# Patient Record
Sex: Female | Born: 1985 | Race: White | Hispanic: No | Smoking: Current every day smoker
Health system: Southern US, Community
[De-identification: ages and names within clinical notes are randomized; demographics above are authoritative.]

## PROBLEM LIST (undated history)

## (undated) DIAGNOSIS — N056 Unspecified nephritic syndrome with dense deposit disease: Secondary | ICD-10-CM

## (undated) DIAGNOSIS — F32A Depression, unspecified: Secondary | ICD-10-CM

## (undated) DIAGNOSIS — I10 Essential (primary) hypertension: Secondary | ICD-10-CM

## (undated) DIAGNOSIS — J189 Pneumonia, unspecified organism: Secondary | ICD-10-CM

## (undated) DIAGNOSIS — D649 Anemia, unspecified: Secondary | ICD-10-CM

## (undated) DIAGNOSIS — C801 Malignant (primary) neoplasm, unspecified: Secondary | ICD-10-CM

## (undated) DIAGNOSIS — E063 Autoimmune thyroiditis: Secondary | ICD-10-CM

## (undated) DIAGNOSIS — E079 Disorder of thyroid, unspecified: Secondary | ICD-10-CM

## (undated) DIAGNOSIS — E039 Hypothyroidism, unspecified: Secondary | ICD-10-CM

## (undated) DIAGNOSIS — F419 Anxiety disorder, unspecified: Secondary | ICD-10-CM

## (undated) DIAGNOSIS — M1990 Unspecified osteoarthritis, unspecified site: Secondary | ICD-10-CM

## (undated) DIAGNOSIS — T8859XA Other complications of anesthesia, initial encounter: Secondary | ICD-10-CM

## (undated) DIAGNOSIS — G40909 Epilepsy, unspecified, not intractable, without status epilepticus: Secondary | ICD-10-CM

## (undated) DIAGNOSIS — N186 End stage renal disease: Secondary | ICD-10-CM

## (undated) DIAGNOSIS — R0600 Dyspnea, unspecified: Secondary | ICD-10-CM

## (undated) DIAGNOSIS — E785 Hyperlipidemia, unspecified: Secondary | ICD-10-CM

## (undated) DIAGNOSIS — Z87442 Personal history of urinary calculi: Secondary | ICD-10-CM

## (undated) DIAGNOSIS — N289 Disorder of kidney and ureter, unspecified: Secondary | ICD-10-CM

## (undated) DIAGNOSIS — Z992 Dependence on renal dialysis: Secondary | ICD-10-CM

## (undated) DIAGNOSIS — Z5189 Encounter for other specified aftercare: Secondary | ICD-10-CM

## (undated) HISTORY — PX: RENAL BIOPSY: SHX156

## (undated) HISTORY — PX: DIAGNOSTIC LAPAROSCOPY: SUR761

## (undated) HISTORY — PX: ABDOMINAL SURGERY: SHX537

## (undated) HISTORY — PX: ABDOMINAL HYSTERECTOMY: SHX81

## (undated) HISTORY — PX: TUMOR REMOVAL: SHX12

---

## 2016-08-25 HISTORY — PX: CERVICAL BIOPSY  W/ LOOP ELECTRODE EXCISION: SUR135

## 2018-08-16 ENCOUNTER — Ambulatory Visit
Admission: RE | Admit: 2018-08-16 | Discharge: 2018-08-16 | Disposition: A | Discharge status: Home / Self Care | Payer: Managed Care, Other (non HMO) | Source: Ambulatory Visit | Attending: Nephrology | Admitting: Nephrology

## 2018-08-16 ENCOUNTER — Ambulatory Visit
Admission: RE | Admit: 2018-08-16 | Discharge: 2018-08-16 | Disposition: A | Discharge status: Home / Self Care | Payer: Managed Care, Other (non HMO) | Attending: Nephrology | Admitting: Nephrology

## 2018-08-16 ENCOUNTER — Other Ambulatory Visit: Payer: Self-pay | Admitting: Nephrology

## 2018-08-16 DIAGNOSIS — R109 Unspecified abdominal pain: Secondary | ICD-10-CM | POA: Diagnosis present

## 2018-08-16 DIAGNOSIS — N200 Calculus of kidney: Secondary | ICD-10-CM | POA: Insufficient documentation

## 2018-11-05 DIAGNOSIS — N186 End stage renal disease: Secondary | ICD-10-CM

## 2018-11-05 DIAGNOSIS — N059 Unspecified nephritic syndrome with unspecified morphologic changes: Secondary | ICD-10-CM | POA: Insufficient documentation

## 2019-04-17 ENCOUNTER — Encounter (HOSPITAL_COMMUNITY): Payer: Self-pay | Admitting: Emergency Medicine

## 2019-04-17 ENCOUNTER — Emergency Department (HOSPITAL_COMMUNITY): Payer: Medicare Other

## 2019-04-17 ENCOUNTER — Other Ambulatory Visit: Payer: Self-pay

## 2019-04-17 ENCOUNTER — Emergency Department (HOSPITAL_COMMUNITY)
Admission: EM | Admit: 2019-04-17 | Discharge: 2019-04-18 | Disposition: A | Discharge status: Home / Self Care | Payer: Medicare Other | Attending: Emergency Medicine | Admitting: Emergency Medicine

## 2019-04-17 DIAGNOSIS — N186 End stage renal disease: Secondary | ICD-10-CM | POA: Diagnosis not present

## 2019-04-17 DIAGNOSIS — Z992 Dependence on renal dialysis: Secondary | ICD-10-CM | POA: Diagnosis not present

## 2019-04-17 DIAGNOSIS — I120 Hypertensive chronic kidney disease with stage 5 chronic kidney disease or end stage renal disease: Secondary | ICD-10-CM | POA: Diagnosis not present

## 2019-04-17 DIAGNOSIS — E860 Dehydration: Secondary | ICD-10-CM | POA: Diagnosis not present

## 2019-04-17 DIAGNOSIS — R339 Retention of urine, unspecified: Secondary | ICD-10-CM | POA: Diagnosis not present

## 2019-04-17 DIAGNOSIS — Z79899 Other long term (current) drug therapy: Secondary | ICD-10-CM | POA: Diagnosis not present

## 2019-04-17 DIAGNOSIS — R42 Dizziness and giddiness: Secondary | ICD-10-CM | POA: Diagnosis present

## 2019-04-17 LAB — URINALYSIS, ROUTINE W REFLEX MICROSCOPIC
Bilirubin Urine: NEGATIVE
Glucose, UA: 150 mg/dL — AB
Ketones, ur: NEGATIVE mg/dL
Nitrite: NEGATIVE
Protein, ur: 100 mg/dL — AB
Specific Gravity, Urine: 1.015 (ref 1.005–1.030)
pH: 7 (ref 5.0–8.0)

## 2019-04-17 LAB — LACTIC ACID, PLASMA: Lactic Acid, Venous: 1.2 mmol/L (ref 0.5–1.9)

## 2019-04-17 LAB — CBC WITH DIFFERENTIAL/PLATELET
Abs Immature Granulocytes: 0.14 10*3/uL — ABNORMAL HIGH (ref 0.00–0.07)
Basophils Absolute: 0.1 10*3/uL (ref 0.0–0.1)
Basophils Relative: 0 %
Eosinophils Absolute: 0.3 10*3/uL (ref 0.0–0.5)
Eosinophils Relative: 2 %
HCT: 33.1 % — ABNORMAL LOW (ref 36.0–46.0)
Hemoglobin: 11.2 g/dL — ABNORMAL LOW (ref 12.0–15.0)
Immature Granulocytes: 1 %
Lymphocytes Relative: 12 %
Lymphs Abs: 1.7 10*3/uL (ref 0.7–4.0)
MCH: 33.1 pg (ref 26.0–34.0)
MCHC: 33.8 g/dL (ref 30.0–36.0)
MCV: 97.9 fL (ref 80.0–100.0)
Monocytes Absolute: 0.9 10*3/uL (ref 0.1–1.0)
Monocytes Relative: 6 %
Neutro Abs: 11.9 10*3/uL — ABNORMAL HIGH (ref 1.7–7.7)
Neutrophils Relative %: 79 %
Platelets: 270 10*3/uL (ref 150–400)
RBC: 3.38 MIL/uL — ABNORMAL LOW (ref 3.87–5.11)
RDW: 14.8 % (ref 11.5–15.5)
WBC: 15.1 10*3/uL — ABNORMAL HIGH (ref 4.0–10.5)
nRBC: 0 % (ref 0.0–0.2)

## 2019-04-17 LAB — COMPREHENSIVE METABOLIC PANEL
ALT: 16 U/L (ref 0–44)
AST: 12 U/L — ABNORMAL LOW (ref 15–41)
Albumin: 3.6 g/dL (ref 3.5–5.0)
Alkaline Phosphatase: 90 U/L (ref 38–126)
Anion gap: 17 — ABNORMAL HIGH (ref 5–15)
BUN: 48 mg/dL — ABNORMAL HIGH (ref 6–20)
CO2: 23 mmol/L (ref 22–32)
Calcium: 10.1 mg/dL (ref 8.9–10.3)
Chloride: 95 mmol/L — ABNORMAL LOW (ref 98–111)
Creatinine, Ser: 12.2 mg/dL — ABNORMAL HIGH (ref 0.44–1.00)
GFR calc Af Amer: 4 mL/min — ABNORMAL LOW (ref >60)
GFR calc non Af Amer: 4 mL/min — ABNORMAL LOW (ref >60)
Glucose, Bld: 160 mg/dL — ABNORMAL HIGH (ref 70–99)
Potassium: 4 mmol/L (ref 3.5–5.1)
Sodium: 135 mmol/L (ref 135–145)
Total Bilirubin: 0.5 mg/dL (ref 0.3–1.2)
Total Protein: 7 g/dL (ref 6.5–8.1)

## 2019-04-17 LAB — PREGNANCY, URINE: Preg Test, Ur: NEGATIVE

## 2019-04-17 LAB — D-DIMER, QUANTITATIVE: D-Dimer, Quant: 0.43 ug/mL-FEU (ref 0.00–0.50)

## 2019-04-17 MED ORDER — LACTATED RINGERS IV BOLUS
250.0000 mL | Freq: Once | INTRAVENOUS | Status: AC
  Administered 2019-04-17: 250 mL via INTRAVENOUS

## 2019-04-17 MED ORDER — ALBUTEROL SULFATE HFA 108 (90 BASE) MCG/ACT IN AERS
INHALATION_SPRAY | RESPIRATORY_TRACT | Status: AC
  Administered 2019-04-17: 2 via RESPIRATORY_TRACT
  Filled 2019-04-17: qty 6.7

## 2019-04-17 MED ORDER — LACTATED RINGERS IV BOLUS
500.0000 mL | Freq: Once | INTRAVENOUS | Status: AC
  Administered 2019-04-17: 500 mL via INTRAVENOUS

## 2019-04-17 MED ORDER — ALBUTEROL SULFATE (2.5 MG/3ML) 0.083% IN NEBU: 5.0000 mg | INHALATION_SOLUTION | Freq: Once | RESPIRATORY_TRACT | Status: DC

## 2019-04-17 MED ORDER — ALBUTEROL SULFATE HFA 108 (90 BASE) MCG/ACT IN AERS
2.0000 | INHALATION_SPRAY | Freq: Four times a day (QID) | RESPIRATORY_TRACT | Status: DC
  Administered 2019-04-17: 17:00:00 2 via RESPIRATORY_TRACT

## 2019-04-17 NOTE — ED Notes (Signed)
Pt heart rate is between 110-115 while ambulating.

## 2019-04-17 NOTE — ED Triage Notes (Signed)
Pt here with c/o decreased urine out put , pt is at home [facility] dialysis 8 hrs/night , took her lasix without result , pt HR 125 with some Sob

## 2019-04-17 NOTE — ED Provider Notes (Signed)
[facility] EMERGENCY DEPARTMENT Provider Note   CSN: [id number] Arrival date & time: 04/17/19  1508     History   Chief Complaint Chief Complaint  Patient presents with  . Tachycardia  . Shortness of Breath    HPI Holly [name] is a 33 y.o. female with history of ESRD on peritoneal dialysis presenting to the ED complaining of dizziness, tachycardia, shortness of breath, and decreased urine output over the past 3 days.  Patient reports that her symptoms began 3 days ago with dizziness.  She reports her dizziness is described as lightheadedness that is worse with standing but is also present at rest.  She endorses palpitations over the past couple of days with some mild shortness of breath.  She reports that she normally has approximately 500 to 800 cc of urine output per day but over the past few days has only voided 200 cc/day.  She reports having the sensation that she needs to urinate but has difficulty initiating urination when she goes to the bathroom.  She reports this is sometimes painful but denies any burning with urination or urinary frequency.  She denies having any fever or chills throughout this time.  She is also endorsing weight gain of 4 pounds from her normal dry weight.  She reports that her dialysate has been clear and denies having any abdominal pain.  She denies any cough, vomiting, diarrhea, or any other complaints.     The history is provided by the patient.    History reviewed. No pertinent past medical history.  There are no active problems to display for this patient.   History reviewed. No pertinent surgical history.   OB History   No obstetric history on file.      Home Medications    Prior to Admission medications   Medication Sig Start Date End Date Taking? Authorizing Provider  calcitRIOL (ROCALTROL) 0.5 MCG capsule Take 0.5 mcg by mouth at bedtime.   Yes [provider]  carvedilol (COREG) 12.5 MG tablet Take 12.5 mg by  mouth 2 (two) times daily with a meal.   Yes [provider]  cinacalcet (SENSIPAR) 60 MG tablet Take 60 mg by mouth daily.   Yes [provider]  ferric citrate (AURYXIA) 1 GM 210 MG(Fe) tablet Take 630 mg by mouth 3 (three) times daily with meals.   Yes [provider]  furosemide (LASIX) 80 MG tablet Take 80 mg by mouth 2 (two) times daily as needed for fluid.   Yes [provider]  levothyroxine (SYNTHROID) 200 MCG tablet Take 200 mcg by mouth daily before breakfast.   Yes [provider]  loratadine (CLARITIN) 10 MG tablet Take 10 mg by mouth daily.   Yes [provider]  losartan (COZAAR) 100 MG tablet Take 100 mg by mouth daily.   Yes [provider]  multivitamin (RENA-VIT) TABS tablet Take 1 tablet by mouth daily.   Yes [provider]  oxcarbazepine (TRILEPTAL) 600 MG tablet Take 600 mg by mouth 2 (two) times daily.   Yes [provider]  simvastatin (ZOCOR) 20 MG tablet Take 20 mg by mouth at bedtime.   Yes [provider]    Family History No family history on file.  Social History Social History   Tobacco Use  . Smoking status: Never Smoker  Substance Use Topics  . Alcohol use: Not on file  . Drug use: Not on file     Allergies   Bupropion, Ciprofloxacin,  Levetiracetam, Oxycodone-acetaminophen, Rituximab, and Tramadol   Review of Systems Review of Systems  Constitutional: Negative for chills and fever.  HENT: Negative for ear pain and sore throat.   Eyes: Negative for pain and visual disturbance.  Respiratory: Positive for shortness of breath. Negative for cough.   Cardiovascular: Positive for palpitations. Negative for chest pain.  Gastrointestinal: Negative for abdominal pain and vomiting.  Genitourinary: Positive for difficulty urinating. Negative for dysuria, frequency and hematuria.  Musculoskeletal: Negative for arthralgias and back pain.  Skin: Negative for color  change and rash.  Neurological: Positive for dizziness. Negative for seizures and syncope.  All other systems reviewed and are negative.    Physical Exam Updated Vital Signs BP 114/88   Pulse 92   Temp 98.5 F (36.9 C) (Oral)   Resp 14   SpO2 100%   Physical Exam Vitals signs and nursing note reviewed.  Constitutional:      General: She is not in acute distress.    Appearance: Normal appearance. She is not ill-appearing, toxic-appearing or diaphoretic.  HENT:     Head: Normocephalic and atraumatic.     Nose: Nose normal. No congestion or rhinorrhea.     Mouth/Throat:     Mouth: Mucous membranes are dry.     Pharynx: Oropharynx is clear. No oropharyngeal exudate or posterior oropharyngeal erythema.  Eyes:     Extraocular Movements: Extraocular movements intact.     Pupils: Pupils are equal, round, and reactive to light.  Neck:     Musculoskeletal: Normal range of motion and neck supple. No neck rigidity or muscular tenderness.  Cardiovascular:     Rate and Rhythm: Regular rhythm. Tachycardia present.     Pulses: Normal pulses.     Heart sounds: Normal heart sounds. No murmur. No friction rub. No gallop.   Pulmonary:     Effort: Pulmonary effort is normal. No respiratory distress.     Breath sounds: Normal breath sounds. No stridor. No wheezing, rhonchi or rales.  Chest:     Chest wall: No tenderness.  Abdominal:     General: Abdomen is flat. There is no distension.     Palpations: Abdomen is soft.     Tenderness: There is no abdominal tenderness. There is no guarding or rebound.  Musculoskeletal: Normal range of motion.        General: No swelling, tenderness, deformity or signs of injury.     Right lower leg: No edema.     Left lower leg: No edema.  Skin:    General: Skin is warm and dry.  Neurological:     General: No focal deficit present.     Mental Status: She is alert and oriented to person, place, and time. Mental status is at baseline.     Cranial Nerves:  No cranial nerve deficit.     Sensory: No sensory deficit.     Motor: No weakness.  Psychiatric:        Mood and Affect: Mood normal.        Behavior: Behavior normal.      ED Treatments / Results  Labs (all labs ordered are listed, but only abnormal results are displayed) Labs Reviewed  CBC WITH DIFFERENTIAL/PLATELET - Abnormal; Notable for the following components:      Result Value   WBC 15.1 (*)    RBC 3.38 (*)    Hemoglobin 11.2 (*)    HCT 33.1 (*)    Neutro Abs 11.9 (*)    Abs Immature Granulocytes  0.14 (*)    All other components within normal limits  COMPREHENSIVE METABOLIC PANEL - Abnormal; Notable for the following components:   Chloride 95 (*)    Glucose, Bld 160 (*)    BUN 48 (*)    Creatinine, Ser 12.20 (*)    AST 12 (*)    GFR calc non Af Amer 4 (*)    GFR calc Af Amer 4 (*)    Anion gap 17 (*)    All other components within normal limits  URINALYSIS, ROUTINE W REFLEX MICROSCOPIC - Abnormal; Notable for the following components:   APPearance HAZY (*)    Glucose, UA 150 (*)    Hgb urine dipstick SMALL (*)    Protein, ur 100 (*)    Leukocytes,Ua TRACE (*)    Bacteria, UA RARE (*)    All other components within normal limits  LACTIC ACID, PLASMA  D-DIMER, QUANTITATIVE (NOT AT [facility])  PREGNANCY, URINE    EKG EKG Interpretation  Date/Time:  Sunday April 17 2019 15:12:18 EDT Ventricular Rate:  125 PR Interval:  132 QRS Duration: 80 QT Interval:  340 QTC Calculation: 490 R Axis:   87 Text Interpretation:  Sinus tachycardia Possible Left atrial enlargement Borderline ECG No previous ECGs available Confirmed by Theotis Burrow [phone]) on 04/17/2019 5:06:06 PM   Radiology Dg Chest Portable 1 View  Result Date: 04/17/2019 CLINICAL DATA:  Shortness of breath EXAM: PORTABLE CHEST 1 VIEW COMPARISON:  None. FINDINGS: Heart and mediastinal contours are within normal limits. No focal opacities or effusions. No acute bony abnormality. IMPRESSION: No active  disease. Electronically Signed   By: Rolm Baptise M.D.   On: 04/17/2019 16:08    Procedures Procedures (including critical care time)  Medications Ordered in ED Medications  albuterol (VENTOLIN HFA) 108 (90 Base) MCG/ACT inhaler 2 puff (2 puffs Inhalation Given 04/17/19 1644)  lactated ringers bolus 500 mL (500 mLs Intravenous New Bag/Given 04/17/19 2320)  lactated ringers bolus 250 mL (250 mLs Intravenous New Bag/Given 04/17/19 2355)     Initial Impression / Assessment and Plan / ED Course  I have reviewed the triage vital signs and the nursing notes.  Pertinent labs & imaging results that were available during my care of the patient were reviewed by me and considered in my medical decision making (see chart for details).        Rishika Mccollom is a 33 y.o. female with history of ESRD on peritoneal dialysis presenting to the ED complaining of dizziness, tachycardia, shortness of breath, and decreased urine output over the past 3 days.  On exam, patient appears volume depleted and is tachycardic to the 120s.  CMP significant for elevated creatinine to 12.2 and BUN to 48, consistent with patient's baseline ESRD.  There are no acute electrolyte derangements.  CBC shows leukocytosis to 15.1.  Patient has not had any infectious symptoms and this is felt to not be indicative of acute infectious process.  Urinalysis and chest x-ray both showed no sign of infection.  Patient's abdominal exam is completely benign and her dialysate has been clear.  Post-void residual was in the 270s.  Will have foley catheter placed for urinary retention.  Orthostatic vital signs obtained by nursing showed increase in heart rate from 98 to 110 upon standing with systolic blood pressure decrease from 125 to 111.  On reassessment, patient's heart rate was 108 while resting in bed and immediately increased to 127 when she sat up. D-dimer ordered to risk-stratify for PE.  Discussed case with nephrology, Dr. Jonnie Finner, who  recommended giving patient a 500 cc fluid bolus if she appears clinically volume depleted.  Will give patient fluid bolus and reassess.  Patient's heart rate improved following fluid bolus.  Patient was given an additional 250 cc.  She reports she is feeling better.  Patient was discharged home with instructions to follow-up with urology in the coming week to have her Foley removed.  She was advised to follow-up closely with her nephrologist.  Patient was discharged home in stable condition.  Final Clinical Impressions(s) / ED Diagnoses   Final diagnoses:  Dehydration  Urinary retention    ED Discharge Orders    None       Candie Chroman, MD 04/18/19 1638    Rex Kras Wenda Overland, MD 04/19/19 [phone]

## 2019-04-18 NOTE — ED Notes (Signed)
Patient verbalizes understanding of discharge instructions. Opportunity for questioning and answers were provided. Armband removed by staff, pt discharged from ED ambulatory with sister to take home

## 2019-05-18 ENCOUNTER — Other Ambulatory Visit: Payer: Self-pay | Admitting: Nephrology

## 2019-05-18 DIAGNOSIS — N186 End stage renal disease: Secondary | ICD-10-CM

## 2019-05-18 DIAGNOSIS — R339 Retention of urine, unspecified: Secondary | ICD-10-CM

## 2019-05-18 DIAGNOSIS — Z992 Dependence on renal dialysis: Secondary | ICD-10-CM

## 2019-05-18 NOTE — Progress Notes (Signed)
Patient c/o decreased UOP Urology appt 1 month away Ordered renal U/S to r/o retention

## 2019-06-08 ENCOUNTER — Other Ambulatory Visit: Payer: Self-pay | Admitting: Nephrology

## 2019-06-08 DIAGNOSIS — N186 End stage renal disease: Secondary | ICD-10-CM

## 2019-06-08 DIAGNOSIS — Z992 Dependence on renal dialysis: Secondary | ICD-10-CM

## 2019-06-08 DIAGNOSIS — R339 Retention of urine, unspecified: Secondary | ICD-10-CM

## 2019-06-14 ENCOUNTER — Ambulatory Visit (HOSPITAL_COMMUNITY)
Admission: RE | Admit: 2019-06-14 | Discharge: 2019-06-14 | Disposition: A | Discharge status: Home / Self Care | Payer: Medicare Other | Source: Ambulatory Visit | Attending: Nephrology | Admitting: Nephrology

## 2019-06-14 ENCOUNTER — Other Ambulatory Visit: Payer: Self-pay

## 2019-06-14 DIAGNOSIS — N186 End stage renal disease: Secondary | ICD-10-CM | POA: Diagnosis present

## 2019-06-14 DIAGNOSIS — Z992 Dependence on renal dialysis: Secondary | ICD-10-CM | POA: Insufficient documentation

## 2019-06-14 DIAGNOSIS — R339 Retention of urine, unspecified: Secondary | ICD-10-CM

## 2019-06-15 ENCOUNTER — Encounter: Payer: Self-pay | Admitting: Nephrology

## 2019-08-21 ENCOUNTER — Ambulatory Visit (HOSPITAL_COMMUNITY)
Admission: EM | Admit: 2019-08-21 | Discharge: 2019-08-21 | Disposition: A | Discharge status: Home / Self Care | Payer: Medicare Other | Attending: Emergency Medicine | Admitting: Emergency Medicine

## 2019-08-21 ENCOUNTER — Other Ambulatory Visit: Payer: Self-pay

## 2019-08-21 ENCOUNTER — Encounter (HOSPITAL_COMMUNITY): Payer: Self-pay

## 2019-08-21 DIAGNOSIS — H9201 Otalgia, right ear: Secondary | ICD-10-CM | POA: Diagnosis not present

## 2019-08-21 DIAGNOSIS — R05 Cough: Secondary | ICD-10-CM | POA: Insufficient documentation

## 2019-08-21 DIAGNOSIS — Z79899 Other long term (current) drug therapy: Secondary | ICD-10-CM | POA: Diagnosis not present

## 2019-08-21 DIAGNOSIS — R0602 Shortness of breath: Secondary | ICD-10-CM | POA: Insufficient documentation

## 2019-08-21 DIAGNOSIS — N186 End stage renal disease: Secondary | ICD-10-CM | POA: Diagnosis not present

## 2019-08-21 DIAGNOSIS — J40 Bronchitis, not specified as acute or chronic: Secondary | ICD-10-CM | POA: Insufficient documentation

## 2019-08-21 DIAGNOSIS — Z20828 Contact with and (suspected) exposure to other viral communicable diseases: Secondary | ICD-10-CM | POA: Insufficient documentation

## 2019-08-21 DIAGNOSIS — Z992 Dependence on renal dialysis: Secondary | ICD-10-CM | POA: Insufficient documentation

## 2019-08-21 DIAGNOSIS — J029 Acute pharyngitis, unspecified: Secondary | ICD-10-CM | POA: Diagnosis not present

## 2019-08-21 DIAGNOSIS — R0789 Other chest pain: Secondary | ICD-10-CM | POA: Insufficient documentation

## 2019-08-21 LAB — POCT RAPID STREP A: Streptococcus, Group A Screen (Direct): NEGATIVE

## 2019-08-21 MED ORDER — BENZONATATE 100 MG PO CAPS: 100.0000 mg | ORAL_CAPSULE | Freq: Three times a day (TID) | ORAL | 0 refills | Status: DC

## 2019-08-21 MED ORDER — ALBUTEROL SULFATE HFA 108 (90 BASE) MCG/ACT IN AERS: 1.0000 | INHALATION_SPRAY | Freq: Four times a day (QID) | RESPIRATORY_TRACT | 0 refills | Status: DC | PRN

## 2019-08-21 MED ORDER — AMOXICILLIN-POT CLAVULANATE 875-125 MG PO TABS: 1.0000 | ORAL_TABLET | Freq: Two times a day (BID) | ORAL | 0 refills | Status: DC

## 2019-08-21 MED ORDER — PREDNISONE 10 MG PO TABS: 40.0000 mg | ORAL_TABLET | Freq: Every day | ORAL | 0 refills | Status: AC

## 2019-08-21 NOTE — ED Triage Notes (Addendum)
Pt presents to the UC with SOB on exertion and sore throat  x 2 days; nasal congestion, cough and body aches x 6 days. Pt had a negative rapid COVID test on 08/17/2019

## 2019-08-21 NOTE — ED Provider Notes (Signed)
[facility]    CSN: [id number] Arrival date & time: 08/21/19  1604      History   Chief Complaint Chief Complaint  Patient presents with  . Shortness of Breath  . Cough  . Sore Throat    HPI Holly [name] is a 33 y.o. female.   Patient is a 33 year old female with past medical history of end-stage renal disease on peritoneal dialysis.  She presents today for approximately 7 days or more of cough, shortness of breath on exertion, nasal congestion, body aches.  She had a rapid Covid test on 08/17/2019 which was negative.  She was started on prednisone 4 days ago for viral sinusitis.  Feels like her symptoms are worsening.  She has hacking cough and mild chest pressure with coughing.  She has been taking over-the-counter cough medicine without any relief.  No fevers, chills, body aches, night sweats.  Mild throat irritation and right ear pain.  ROS per HPI    Shortness of Breath Associated symptoms: cough   Cough Associated symptoms: shortness of breath   Sore Throat Associated symptoms include shortness of breath.    History reviewed. No pertinent past medical history.  There are no problems to display for this patient.   History reviewed. No pertinent surgical history.  OB History   No obstetric history on file.      Home Medications    Prior to Admission medications   Medication Sig Start Date End Date Taking? Authorizing Provider  albuterol (VENTOLIN HFA) 108 (90 Base) MCG/ACT inhaler Inhale 1-2 puffs into the lungs every 6 (six) hours as needed for wheezing or shortness of breath. 08/21/19   Loura Halt A, NP  amoxicillin-clavulanate (AUGMENTIN) 875-125 MG tablet Take 1 tablet by mouth every 12 (twelve) hours. 08/21/19   Loura Halt A, NP  benzonatate (TESSALON) 100 MG capsule Take 1 capsule (100 mg total) by mouth every 8 (eight) hours. 08/21/19   Loura Halt A, NP  calcitRIOL (ROCALTROL) 0.5 MCG capsule Take 0.5 mcg by mouth at bedtime.    [provider]  carvedilol (COREG) 12.5 MG tablet Take 12.5 mg by mouth 2 (two) times daily with a meal.    [provider]  cinacalcet (SENSIPAR) 60 MG tablet Take 60 mg by mouth daily.    [provider]  ferric citrate (AURYXIA) 1 GM 210 MG(Fe) tablet Take 630 mg by mouth 3 (three) times daily with meals.    [provider]  furosemide (LASIX) 80 MG tablet Take 80 mg by mouth 2 (two) times daily as needed for fluid.    [provider]  levothyroxine (SYNTHROID) 200 MCG tablet Take 200 mcg by mouth daily before breakfast.    [provider]  loratadine (CLARITIN) 10 MG tablet Take 10 mg by mouth daily.    [provider]  losartan (COZAAR) 100 MG tablet Take 100 mg by mouth daily.    [provider]  multivitamin (RENA-VIT) TABS tablet Take 1 tablet by mouth daily.    [provider]  oxcarbazepine (TRILEPTAL) 600 MG tablet Take 600 mg by mouth 2 (two) times daily.    [provider]  predniSONE (DELTASONE) 10 MG tablet Take 4 tablets (40 mg total) by mouth daily for 5 days. 08/21/19 08/26/19  Loura Halt A, NP  simvastatin (ZOCOR) 20 MG tablet Take 20 mg by mouth at bedtime.    [provider]    Family History History reviewed. No pertinent family history.  Social History Social History   Tobacco Use  . Smoking status: Never Smoker  . Smokeless tobacco: Never Used  Substance Use Topics  . Alcohol use: Not on file  . Drug use: Not on file     Allergies   Bupropion, Ciprofloxacin, Levetiracetam, Oxycodone-acetaminophen, Rituximab, and Tramadol   Review of Systems Review of Systems  Respiratory: Positive for cough and shortness of breath.      Physical Exam Triage Vital Signs ED Triage Vitals  Enc Vitals Group     BP 08/21/19 1623 (!) 138/96     Pulse Rate 08/21/19 1623 86     Resp 08/21/19 1623 18     Temp 08/21/19 1623 98.4 F (36.9 C)     Temp Source 08/21/19 1623 Oral     SpO2  08/21/19 1623 98 %     Weight --      Height --      Head Circumference --      Peak Flow --      Pain Score 08/21/19 1621 8     Pain Loc --      Pain Edu? --      Excl. in [place]? --    No data found.  Updated Vital Signs BP (!) 138/96 (BP Location: Right Arm)   Pulse 86   Temp 98.4 F (36.9 C) (Oral)   Resp 18   LMP 08/18/2019 (Exact Date)   SpO2 98%   Visual Acuity Right Eye Distance:   Left Eye Distance:   Bilateral Distance:    Right Eye Near:   Left Eye Near:    Bilateral Near:     Physical Exam Vitals and nursing note reviewed.  Constitutional:      General: She is not in acute distress.    Appearance: She is ill-appearing. She is not toxic-appearing or diaphoretic.  HENT:     Head: Normocephalic and atraumatic.     Right Ear: Tympanic membrane and ear canal normal.     Left Ear: Tympanic membrane and ear canal normal.     Nose: Nose normal.     Mouth/Throat:     Pharynx: Oropharynx is clear.  Eyes:     Conjunctiva/sclera: Conjunctivae normal.  Cardiovascular:     Rate and Rhythm: Normal rate and regular rhythm.  Pulmonary:     Effort: Pulmonary effort is normal.     Comments: Coarse lung sounds throughout with decreased air movement. Hacking cough Musculoskeletal:        General: Normal range of motion.     Cervical back: Normal range of motion.  Skin:    General: Skin is warm and dry.  Neurological:     Mental Status: She is alert.  Psychiatric:        Mood and Affect: Mood normal.      UC Treatments / Results  Labs (all labs ordered are listed, but only abnormal results are displayed) Labs Reviewed  NOVEL CORONAVIRUS, NAA (HOSP ORDER, SEND-OUT TO REF LAB; TAT 18-24 HRS)  CULTURE, GROUP A STREP [facility])  POCT RAPID STREP A    EKG   Radiology No results found.  Procedures Procedures (including critical care time)  Medications Ordered in UC Medications - No data to display  Initial Impression / Assessment and Plan / UC Course  I  have reviewed the triage vital signs and the nursing notes.  Pertinent labs & imaging results that were available during my care of the patient were reviewed by me and considered  in my medical decision making (see chart for details).     Bronchitis-treating with Augmentin, Tessalon Perles for cough, prednisone daily for 5 days and albuterol inhaler as needed. Covid swab sent out for testing with labs pending Return precautions given Final Clinical Impressions(s) / UC Diagnoses   Final diagnoses:  Bronchitis     Discharge Instructions     Treating you for bronchitis. Augmentin to treat infection.  Take this as prescribed. Tessalon Perles for cough.  You can take 1-2 every 8 hours as needed Prednisone daily for the next 5 days Albuterol  inhaler every 6 hours as needed for cough, wheezing or shortness of breath    ED Prescriptions    Medication Sig Dispense Auth. Provider   amoxicillin-clavulanate (AUGMENTIN) 875-125 MG tablet Take 1 tablet by mouth every 12 (twelve) hours. 14 tablet Kennadi Albany A, NP   benzonatate (TESSALON) 100 MG capsule Take 1 capsule (100 mg total) by mouth every 8 (eight) hours. 21 capsule Dorothy Landgrebe A, NP   predniSONE (DELTASONE) 10 MG tablet Take 4 tablets (40 mg total) by mouth daily for 5 days. 20 tablet Artis Beggs A, NP   albuterol (VENTOLIN HFA) 108 (90 Base) MCG/ACT inhaler Inhale 1-2 puffs into the lungs every 6 (six) hours as needed for wheezing or shortness of breath. 18 g Loura Halt A, NP     PDMP not reviewed this encounter.   Orvan July, NP 08/21/19 1714

## 2019-08-21 NOTE — Discharge Instructions (Signed)
Treating you for bronchitis. Augmentin to treat infection.  Take this as prescribed. Tessalon Perles for cough.  You can take 1-2 every 8 hours as needed Prednisone daily for the next 5 days Albuterol  inhaler every 6 hours as needed for cough, wheezing or shortness of breath

## 2019-08-22 LAB — NOVEL CORONAVIRUS, NAA (HOSP ORDER, SEND-OUT TO REF LAB; TAT 18-24 HRS): SARS-CoV-2, NAA: NOT DETECTED

## 2019-08-24 LAB — CULTURE, GROUP A STREP (THRC)

## 2020-02-19 ENCOUNTER — Other Ambulatory Visit: Payer: Self-pay

## 2020-02-19 ENCOUNTER — Encounter (HOSPITAL_COMMUNITY): Payer: Self-pay | Admitting: Emergency Medicine

## 2020-02-19 ENCOUNTER — Emergency Department (HOSPITAL_COMMUNITY): Payer: Medicare Other

## 2020-02-19 DIAGNOSIS — Y999 Unspecified external cause status: Secondary | ICD-10-CM | POA: Insufficient documentation

## 2020-02-19 DIAGNOSIS — S4992XA Unspecified injury of left shoulder and upper arm, initial encounter: Secondary | ICD-10-CM | POA: Diagnosis present

## 2020-02-19 DIAGNOSIS — S46812A Strain of other muscles, fascia and tendons at shoulder and upper arm level, left arm, initial encounter: Secondary | ICD-10-CM | POA: Insufficient documentation

## 2020-02-19 DIAGNOSIS — R112 Nausea with vomiting, unspecified: Secondary | ICD-10-CM | POA: Diagnosis not present

## 2020-02-19 DIAGNOSIS — R0789 Other chest pain: Secondary | ICD-10-CM | POA: Diagnosis not present

## 2020-02-19 DIAGNOSIS — R519 Headache, unspecified: Secondary | ICD-10-CM | POA: Insufficient documentation

## 2020-02-19 DIAGNOSIS — Y939 Activity, unspecified: Secondary | ICD-10-CM | POA: Insufficient documentation

## 2020-02-19 DIAGNOSIS — Z79899 Other long term (current) drug therapy: Secondary | ICD-10-CM | POA: Insufficient documentation

## 2020-02-19 DIAGNOSIS — Z885 Allergy status to narcotic agent status: Secondary | ICD-10-CM | POA: Insufficient documentation

## 2020-02-19 DIAGNOSIS — Z888 Allergy status to other drugs, medicaments and biological substances status: Secondary | ICD-10-CM | POA: Diagnosis not present

## 2020-02-19 DIAGNOSIS — Y929 Unspecified place or not applicable: Secondary | ICD-10-CM | POA: Insufficient documentation

## 2020-02-19 DIAGNOSIS — X58XXXA Exposure to other specified factors, initial encounter: Secondary | ICD-10-CM | POA: Diagnosis not present

## 2020-02-19 DIAGNOSIS — Z881 Allergy status to other antibiotic agents status: Secondary | ICD-10-CM | POA: Insufficient documentation

## 2020-02-19 LAB — CBC
HCT: 28.1 % — ABNORMAL LOW (ref 36.0–46.0)
Hemoglobin: 9.5 g/dL — ABNORMAL LOW (ref 12.0–15.0)
MCH: 31.7 pg (ref 26.0–34.0)
MCHC: 33.8 g/dL (ref 30.0–36.0)
MCV: 93.7 fL (ref 80.0–100.0)
Platelets: 281 10*3/uL (ref 150–400)
RBC: 3 MIL/uL — ABNORMAL LOW (ref 3.87–5.11)
RDW: 14.8 % (ref 11.5–15.5)
WBC: 11.9 10*3/uL — ABNORMAL HIGH (ref 4.0–10.5)
nRBC: 0 % (ref 0.0–0.2)

## 2020-02-19 LAB — BASIC METABOLIC PANEL
Anion gap: 16 — ABNORMAL HIGH (ref 5–15)
BUN: 57 mg/dL — ABNORMAL HIGH (ref 6–20)
CO2: 23 mmol/L (ref 22–32)
Calcium: 9 mg/dL (ref 8.9–10.3)
Chloride: 93 mmol/L — ABNORMAL LOW (ref 98–111)
Creatinine, Ser: 15.57 mg/dL — ABNORMAL HIGH (ref 0.44–1.00)
GFR calc Af Amer: 3 mL/min — ABNORMAL LOW (ref >60)
GFR calc non Af Amer: 3 mL/min — ABNORMAL LOW (ref >60)
Glucose, Bld: 121 mg/dL — ABNORMAL HIGH (ref 70–99)
Potassium: 4.1 mmol/L (ref 3.5–5.1)
Sodium: 132 mmol/L — ABNORMAL LOW (ref 135–145)

## 2020-02-19 LAB — I-STAT BETA HCG BLOOD, ED (NOT ORDERABLE): I-stat hCG, quantitative: <5 m[IU]/mL (ref <5)

## 2020-02-19 LAB — TROPONIN I (HIGH SENSITIVITY): Troponin I (High Sensitivity): 4 ng/L (ref <18)

## 2020-02-19 NOTE — ED Triage Notes (Signed)
Patient c/o chest pain radiating to the left arm x 2 days. Reports N/V today. Self manages peritoneal dialysis. Reports recently starting Effexor and progesterone x2 days ago.

## 2020-02-20 ENCOUNTER — Emergency Department (HOSPITAL_COMMUNITY)
Admission: EM | Admit: 2020-02-20 | Discharge: 2020-02-20 | Disposition: A | Discharge status: Home / Self Care | Payer: Medicare Other | Attending: Emergency Medicine | Admitting: Emergency Medicine

## 2020-02-20 DIAGNOSIS — S46812A Strain of other muscles, fascia and tendons at shoulder and upper arm level, left arm, initial encounter: Secondary | ICD-10-CM | POA: Diagnosis not present

## 2020-02-20 DIAGNOSIS — T148XXA Other injury of unspecified body region, initial encounter: Secondary | ICD-10-CM

## 2020-02-20 DIAGNOSIS — R0789 Other chest pain: Secondary | ICD-10-CM

## 2020-02-20 LAB — TROPONIN I (HIGH SENSITIVITY): Troponin I (High Sensitivity): 4 ng/L (ref <18)

## 2020-02-20 NOTE — ED Provider Notes (Signed)
[facility] DEPT Provider Note  CSN: [id number] Arrival date & time: 02/19/20 2159  Chief Complaint(s) Chest Pain  HPI Holly [name] is a 34 y.o. female  CC: chest pain  Onset/Duration: 2 days, gradual Timing: constant Location: left upper/shoulder Quality: aching Severity: moderate to severe Modifying Factors:  Improved by: immobility  Worsened by: Movement, range of motion of the left shoulder and palpation/pressure of the upper chest and back Associated Signs/Symptoms:  Pertinent (+): Left headache and nausea  Pertinent (-): Fevers, chills, emesis, shortness of breath,, cough, abdominal pain Context: Patient was recently started on Effexor and progesterone.  Wanted to be evaluated in case this was a reaction from the medication.   HPI  Past Medical History History reviewed. No pertinent past medical history. There are no problems to display for this patient.  Home Medication(s) Prior to Admission medications   Medication Sig Start Date End Date Taking? Authorizing Provider  albuterol (VENTOLIN HFA) 108 (90 Base) MCG/ACT inhaler Inhale 1-2 puffs into the lungs every 6 (six) hours as needed for wheezing or shortness of breath. 08/21/19   Loura Halt A, NP  amoxicillin-clavulanate (AUGMENTIN) 875-125 MG tablet Take 1 tablet by mouth every 12 (twelve) hours. 08/21/19   Loura Halt A, NP  benzonatate (TESSALON) 100 MG capsule Take 1 capsule (100 mg total) by mouth every 8 (eight) hours. 08/21/19   Loura Halt A, NP  calcitRIOL (ROCALTROL) 0.5 MCG capsule Take 0.5 mcg by mouth at bedtime.    [provider]  carvedilol (COREG) 12.5 MG tablet Take 12.5 mg by mouth 2 (two) times daily with a meal.    [provider]  cinacalcet (SENSIPAR) 60 MG tablet Take 60 mg by mouth daily.    [provider]  ferric citrate (AURYXIA) 1 GM 210 MG(Fe) tablet Take 630 mg by mouth 3 (three) times daily with meals.    [provider]    furosemide (LASIX) 80 MG tablet Take 80 mg by mouth 2 (two) times daily as needed for fluid.    [provider]  levothyroxine (SYNTHROID) 200 MCG tablet Take 200 mcg by mouth daily before breakfast.    [provider]  loratadine (CLARITIN) 10 MG tablet Take 10 mg by mouth daily.    [provider]  losartan (COZAAR) 100 MG tablet Take 100 mg by mouth daily.    [provider]  multivitamin (RENA-VIT) TABS tablet Take 1 tablet by mouth daily.    [provider]  oxcarbazepine (TRILEPTAL) 600 MG tablet Take 600 mg by mouth 2 (two) times daily.    [provider]  simvastatin (ZOCOR) 20 MG tablet Take 20 mg by mouth at bedtime.    [provider]                                                                                                                                    Past  Surgical History History reviewed. No pertinent surgical history. Family History No family history on file.  Social History Social History   Tobacco Use  . Smoking status: Never Smoker  . Smokeless tobacco: Never Used  Substance Use Topics  . Alcohol use: Not on file  . Drug use: Not on file   Allergies Bupropion, Ciprofloxacin, Levetiracetam, Oxycodone-acetaminophen, Rituximab, and Tramadol  Review of Systems Review of Systems All other systems are reviewed and are negative for acute change except as noted in the HPI  Physical Exam Vital Signs  I have reviewed the triage vital signs BP 136/85   Pulse 94   Temp 99.6 F (37.6 C) (Oral)   Resp 16   Ht 5\' 7"  (1.702 m)   Wt 106.1 kg   LMP 02/19/2020   SpO2 99%   BMI 36.65 kg/m   Physical Exam Vitals reviewed.  Constitutional:      General: She is not in acute distress.    Appearance: She is well-developed. She is not diaphoretic.  HENT:     Head: Normocephalic and atraumatic.     Right Ear: External ear normal.     Left Ear: External ear normal.     Nose: Nose normal.  Eyes:      General: No scleral icterus.    Conjunctiva/sclera: Conjunctivae normal.  Neck:     Trachea: Phonation normal.  Cardiovascular:     Rate and Rhythm: Normal rate and regular rhythm.  Pulmonary:     Effort: Pulmonary effort is normal. No respiratory distress.     Breath sounds: No stridor.  Chest:     Chest wall: Tenderness present.    Abdominal:     General: There is no distension.    Musculoskeletal:        General: Normal range of motion.     Cervical back: Normal range of motion. Spasms and tenderness present.       Back:  Neurological:     Mental Status: She is alert and oriented to person, place, and time.  Psychiatric:        Behavior: Behavior normal.     ED Results and Treatments Labs (all labs ordered are listed, but only abnormal results are displayed) Labs Reviewed  BASIC METABOLIC PANEL - Abnormal; Notable for the following components:      Result Value   Sodium 132 (*)    Chloride 93 (*)    Glucose, Bld 121 (*)    BUN 57 (*)    Creatinine, Ser 15.57 (*)    GFR calc non Af Amer 3 (*)    GFR calc Af Amer 3 (*)    Anion gap 16 (*)    All other components within normal limits  CBC - Abnormal; Notable for the following components:   WBC 11.9 (*)    RBC 3.00 (*)    Hemoglobin 9.5 (*)    HCT 28.1 (*)    All other components within normal limits  I-STAT BETA HCG BLOOD, ED ([facility], [facility], [facility] ONLY)  I-STAT BETA HCG BLOOD, ED (NOT ORDERABLE)  TROPONIN I (HIGH SENSITIVITY)  TROPONIN I (HIGH SENSITIVITY)  EKG  EKG Interpretation  Date/Time:  Sunday February 19 2020 22:47:25 EDT Ventricular Rate:  93 PR Interval:    QRS Duration: 90 QT Interval:  365 QTC Calculation: 542 R Axis:   75 Text Interpretation: Sinus rhythm 12 Lead; Mason-Likar No significant change since last tracing Confirmed by Addison Lank [phone]) on 02/20/2020 2:56:20 AM       Radiology DG Chest 2 View  Result Date: 02/19/2020 CLINICAL DATA:  Chest pain radiating to left arm for 2 days, nausea and vomiting EXAM: CHEST - 2 VIEW COMPARISON:  04/17/2019 FINDINGS: Frontal and lateral views of the chest demonstrate an unremarkable cardiac silhouette. No airspace disease, effusion, or pneumothorax. No acute bony abnormalities. IMPRESSION: 1. No acute intrathoracic process. Electronically Signed   By: Randa Ngo M.D.   On: 02/19/2020 23:01    Pertinent labs & imaging results that were available during my care of the patient were reviewed by me and considered in my medical decision making (see chart for details).  Medications Ordered in ED Medications - No data to display                                                                                                                                  Procedures Procedures  (including critical care time)  Medical Decision Making / ED Course I have reviewed the nursing notes for this encounter and the patient's prior records (if available in EHR or on provided paperwork).   Holly [name] was evaluated in Emergency Department on 02/20/2020 for the symptoms described in the history of present illness. She was evaluated in the context of the global COVID-19 pandemic, which necessitated consideration that the patient might be at risk for infection with the SARS-CoV-2 virus that causes COVID-19. Institutional protocols and algorithms that pertain to the evaluation of patients at risk for COVID-19 are in a state of rapid change based on information released by regulatory bodies including the CDC and federal and state organizations. These policies and algorithms were followed during the patient's care in the ED.  Highly atypical chest pain inconsistent with ACS.  EKG without acute ischemic changes or evidence of pericarditis.  Troponin negative.  No need for additional trops. Gradual onset, low suspicion for pulmonary embolism.   Presentation not classic for aortic dissection or esophageal perforation.  Chest x-ray without evidence suggestive of pneumonia, pneumothorax, pneumomediastinum.  No abnormal contour of the mediastinum to suggest dissection. No evidence of acute injuries.  Most consistent with muscle strain/spasm of the left shoulder girdle muscles.        Final Clinical Impression(s) / ED Diagnoses Final diagnoses:  Chest wall pain  Muscle strain   The patient appears reasonably screened and/or stabilized for discharge and I doubt any other medical condition or other [facility] requiring further screening, evaluation, or treatment in the ED at this time prior to discharge. Safe for discharge with strict return precautions.  Disposition: Discharge  Condition: Good  I have discussed the results, Dx and Tx plan with the patient/family who expressed understanding and agree(s) with the plan. Discharge instructions discussed at length. The patient/family was given strict return precautions who verbalized understanding of the instructions. No further questions at time of discharge.    ED Discharge Orders    None        Follow Up: Primary care provider  Schedule an appointment as soon as possible for a visit  As needed      This chart was dictated using voice recognition software.  Despite best efforts to proofread,  errors can occur which can change the documentation meaning.   Fatima Blank, MD 02/20/20 [phone]

## 2020-02-20 NOTE — Discharge Instructions (Addendum)
You may use over-the-counter Acetaminophen (Tylenol), topical muscle creams such as SalonPas, Icy Hot, Bengay, etc. Please stretch, apply heat, and have massage therapy for additional assistance.  

## 2020-04-12 DIAGNOSIS — Z975 Presence of (intrauterine) contraceptive device: Secondary | ICD-10-CM | POA: Insufficient documentation

## 2020-11-23 DIAGNOSIS — N186 End stage renal disease: Secondary | ICD-10-CM | POA: Diagnosis not present

## 2020-11-23 DIAGNOSIS — Z992 Dependence on renal dialysis: Secondary | ICD-10-CM | POA: Diagnosis not present

## 2020-11-24 DIAGNOSIS — N186 End stage renal disease: Secondary | ICD-10-CM | POA: Diagnosis not present

## 2020-11-24 DIAGNOSIS — Z992 Dependence on renal dialysis: Secondary | ICD-10-CM | POA: Diagnosis not present

## 2020-11-25 DIAGNOSIS — Z992 Dependence on renal dialysis: Secondary | ICD-10-CM | POA: Diagnosis not present

## 2020-11-25 DIAGNOSIS — N186 End stage renal disease: Secondary | ICD-10-CM | POA: Diagnosis not present

## 2020-11-26 DIAGNOSIS — Z992 Dependence on renal dialysis: Secondary | ICD-10-CM | POA: Diagnosis not present

## 2020-11-26 DIAGNOSIS — N186 End stage renal disease: Secondary | ICD-10-CM | POA: Diagnosis not present

## 2020-11-27 DIAGNOSIS — Z992 Dependence on renal dialysis: Secondary | ICD-10-CM | POA: Diagnosis not present

## 2020-11-27 DIAGNOSIS — N186 End stage renal disease: Secondary | ICD-10-CM | POA: Diagnosis not present

## 2020-11-28 DIAGNOSIS — Z992 Dependence on renal dialysis: Secondary | ICD-10-CM | POA: Diagnosis not present

## 2020-11-28 DIAGNOSIS — N186 End stage renal disease: Secondary | ICD-10-CM | POA: Diagnosis not present

## 2020-11-29 DIAGNOSIS — N186 End stage renal disease: Secondary | ICD-10-CM | POA: Diagnosis not present

## 2020-11-29 DIAGNOSIS — Z992 Dependence on renal dialysis: Secondary | ICD-10-CM | POA: Diagnosis not present

## 2020-11-30 DIAGNOSIS — Z992 Dependence on renal dialysis: Secondary | ICD-10-CM | POA: Diagnosis not present

## 2020-11-30 DIAGNOSIS — N186 End stage renal disease: Secondary | ICD-10-CM | POA: Diagnosis not present

## 2020-12-01 DIAGNOSIS — Z992 Dependence on renal dialysis: Secondary | ICD-10-CM | POA: Diagnosis not present

## 2020-12-01 DIAGNOSIS — N186 End stage renal disease: Secondary | ICD-10-CM | POA: Diagnosis not present

## 2020-12-02 DIAGNOSIS — Z992 Dependence on renal dialysis: Secondary | ICD-10-CM | POA: Diagnosis not present

## 2020-12-02 DIAGNOSIS — N186 End stage renal disease: Secondary | ICD-10-CM | POA: Diagnosis not present

## 2020-12-03 DIAGNOSIS — N186 End stage renal disease: Secondary | ICD-10-CM | POA: Diagnosis not present

## 2020-12-03 DIAGNOSIS — Z992 Dependence on renal dialysis: Secondary | ICD-10-CM | POA: Diagnosis not present

## 2020-12-04 DIAGNOSIS — Z992 Dependence on renal dialysis: Secondary | ICD-10-CM | POA: Diagnosis not present

## 2020-12-04 DIAGNOSIS — N186 End stage renal disease: Secondary | ICD-10-CM | POA: Diagnosis not present

## 2020-12-05 DIAGNOSIS — N186 End stage renal disease: Secondary | ICD-10-CM | POA: Diagnosis not present

## 2020-12-05 DIAGNOSIS — Z992 Dependence on renal dialysis: Secondary | ICD-10-CM | POA: Diagnosis not present

## 2020-12-06 DIAGNOSIS — N186 End stage renal disease: Secondary | ICD-10-CM | POA: Diagnosis not present

## 2020-12-06 DIAGNOSIS — Z992 Dependence on renal dialysis: Secondary | ICD-10-CM | POA: Diagnosis not present

## 2020-12-07 DIAGNOSIS — N186 End stage renal disease: Secondary | ICD-10-CM | POA: Diagnosis not present

## 2020-12-07 DIAGNOSIS — Z992 Dependence on renal dialysis: Secondary | ICD-10-CM | POA: Diagnosis not present

## 2020-12-08 DIAGNOSIS — Z992 Dependence on renal dialysis: Secondary | ICD-10-CM | POA: Diagnosis not present

## 2020-12-08 DIAGNOSIS — N186 End stage renal disease: Secondary | ICD-10-CM | POA: Diagnosis not present

## 2020-12-09 DIAGNOSIS — N186 End stage renal disease: Secondary | ICD-10-CM | POA: Diagnosis not present

## 2020-12-09 DIAGNOSIS — Z992 Dependence on renal dialysis: Secondary | ICD-10-CM | POA: Diagnosis not present

## 2020-12-10 DIAGNOSIS — N186 End stage renal disease: Secondary | ICD-10-CM | POA: Diagnosis not present

## 2020-12-10 DIAGNOSIS — Z992 Dependence on renal dialysis: Secondary | ICD-10-CM | POA: Diagnosis not present

## 2020-12-11 DIAGNOSIS — N186 End stage renal disease: Secondary | ICD-10-CM | POA: Diagnosis not present

## 2020-12-11 DIAGNOSIS — Z992 Dependence on renal dialysis: Secondary | ICD-10-CM | POA: Diagnosis not present

## 2020-12-12 DIAGNOSIS — Z992 Dependence on renal dialysis: Secondary | ICD-10-CM | POA: Diagnosis not present

## 2020-12-12 DIAGNOSIS — N186 End stage renal disease: Secondary | ICD-10-CM | POA: Diagnosis not present

## 2020-12-13 DIAGNOSIS — N186 End stage renal disease: Secondary | ICD-10-CM | POA: Diagnosis not present

## 2020-12-13 DIAGNOSIS — Z992 Dependence on renal dialysis: Secondary | ICD-10-CM | POA: Diagnosis not present

## 2020-12-14 DIAGNOSIS — Z992 Dependence on renal dialysis: Secondary | ICD-10-CM | POA: Diagnosis not present

## 2020-12-14 DIAGNOSIS — N186 End stage renal disease: Secondary | ICD-10-CM | POA: Diagnosis not present

## 2020-12-15 DIAGNOSIS — Z992 Dependence on renal dialysis: Secondary | ICD-10-CM | POA: Diagnosis not present

## 2020-12-15 DIAGNOSIS — N186 End stage renal disease: Secondary | ICD-10-CM | POA: Diagnosis not present

## 2020-12-16 DIAGNOSIS — Z992 Dependence on renal dialysis: Secondary | ICD-10-CM | POA: Diagnosis not present

## 2020-12-16 DIAGNOSIS — N186 End stage renal disease: Secondary | ICD-10-CM | POA: Diagnosis not present

## 2020-12-17 DIAGNOSIS — N186 End stage renal disease: Secondary | ICD-10-CM | POA: Diagnosis not present

## 2020-12-17 DIAGNOSIS — Z992 Dependence on renal dialysis: Secondary | ICD-10-CM | POA: Diagnosis not present

## 2020-12-18 DIAGNOSIS — Z992 Dependence on renal dialysis: Secondary | ICD-10-CM | POA: Diagnosis not present

## 2020-12-18 DIAGNOSIS — N186 End stage renal disease: Secondary | ICD-10-CM | POA: Diagnosis not present

## 2020-12-19 DIAGNOSIS — N186 End stage renal disease: Secondary | ICD-10-CM | POA: Diagnosis not present

## 2020-12-19 DIAGNOSIS — Z992 Dependence on renal dialysis: Secondary | ICD-10-CM | POA: Diagnosis not present

## 2020-12-20 DIAGNOSIS — Z992 Dependence on renal dialysis: Secondary | ICD-10-CM | POA: Diagnosis not present

## 2020-12-20 DIAGNOSIS — N186 End stage renal disease: Secondary | ICD-10-CM | POA: Diagnosis not present

## 2020-12-21 DIAGNOSIS — Z992 Dependence on renal dialysis: Secondary | ICD-10-CM | POA: Diagnosis not present

## 2020-12-21 DIAGNOSIS — N186 End stage renal disease: Secondary | ICD-10-CM | POA: Diagnosis not present

## 2020-12-22 DIAGNOSIS — Z992 Dependence on renal dialysis: Secondary | ICD-10-CM | POA: Diagnosis not present

## 2020-12-22 DIAGNOSIS — N186 End stage renal disease: Secondary | ICD-10-CM | POA: Diagnosis not present

## 2020-12-23 DIAGNOSIS — Z992 Dependence on renal dialysis: Secondary | ICD-10-CM | POA: Diagnosis not present

## 2020-12-23 DIAGNOSIS — N186 End stage renal disease: Secondary | ICD-10-CM | POA: Diagnosis not present

## 2020-12-24 DIAGNOSIS — Z992 Dependence on renal dialysis: Secondary | ICD-10-CM | POA: Diagnosis not present

## 2020-12-24 DIAGNOSIS — N186 End stage renal disease: Secondary | ICD-10-CM | POA: Diagnosis not present

## 2020-12-25 DIAGNOSIS — N186 End stage renal disease: Secondary | ICD-10-CM | POA: Diagnosis not present

## 2020-12-25 DIAGNOSIS — Z992 Dependence on renal dialysis: Secondary | ICD-10-CM | POA: Diagnosis not present

## 2020-12-26 DIAGNOSIS — N186 End stage renal disease: Secondary | ICD-10-CM | POA: Diagnosis not present

## 2020-12-26 DIAGNOSIS — Z992 Dependence on renal dialysis: Secondary | ICD-10-CM | POA: Diagnosis not present

## 2020-12-27 DIAGNOSIS — N186 End stage renal disease: Secondary | ICD-10-CM | POA: Diagnosis not present

## 2020-12-27 DIAGNOSIS — Z992 Dependence on renal dialysis: Secondary | ICD-10-CM | POA: Diagnosis not present

## 2020-12-28 DIAGNOSIS — Z992 Dependence on renal dialysis: Secondary | ICD-10-CM | POA: Diagnosis not present

## 2020-12-28 DIAGNOSIS — N186 End stage renal disease: Secondary | ICD-10-CM | POA: Diagnosis not present

## 2020-12-29 DIAGNOSIS — N186 End stage renal disease: Secondary | ICD-10-CM | POA: Diagnosis not present

## 2020-12-29 DIAGNOSIS — Z992 Dependence on renal dialysis: Secondary | ICD-10-CM | POA: Diagnosis not present

## 2020-12-30 DIAGNOSIS — N186 End stage renal disease: Secondary | ICD-10-CM | POA: Diagnosis not present

## 2020-12-30 DIAGNOSIS — Z992 Dependence on renal dialysis: Secondary | ICD-10-CM | POA: Diagnosis not present

## 2020-12-31 DIAGNOSIS — Z992 Dependence on renal dialysis: Secondary | ICD-10-CM | POA: Diagnosis not present

## 2020-12-31 DIAGNOSIS — N186 End stage renal disease: Secondary | ICD-10-CM | POA: Diagnosis not present

## 2021-01-01 DIAGNOSIS — Z992 Dependence on renal dialysis: Secondary | ICD-10-CM | POA: Diagnosis not present

## 2021-01-01 DIAGNOSIS — N186 End stage renal disease: Secondary | ICD-10-CM | POA: Diagnosis not present

## 2021-01-02 DIAGNOSIS — N186 End stage renal disease: Secondary | ICD-10-CM | POA: Diagnosis not present

## 2021-01-02 DIAGNOSIS — Z992 Dependence on renal dialysis: Secondary | ICD-10-CM | POA: Diagnosis not present

## 2021-01-03 DIAGNOSIS — N186 End stage renal disease: Secondary | ICD-10-CM | POA: Diagnosis not present

## 2021-01-03 DIAGNOSIS — Z992 Dependence on renal dialysis: Secondary | ICD-10-CM | POA: Diagnosis not present

## 2021-01-04 DIAGNOSIS — Z992 Dependence on renal dialysis: Secondary | ICD-10-CM | POA: Diagnosis not present

## 2021-01-04 DIAGNOSIS — N186 End stage renal disease: Secondary | ICD-10-CM | POA: Diagnosis not present

## 2021-01-05 DIAGNOSIS — Z992 Dependence on renal dialysis: Secondary | ICD-10-CM | POA: Diagnosis not present

## 2021-01-05 DIAGNOSIS — N186 End stage renal disease: Secondary | ICD-10-CM | POA: Diagnosis not present

## 2021-01-06 DIAGNOSIS — Z992 Dependence on renal dialysis: Secondary | ICD-10-CM | POA: Diagnosis not present

## 2021-01-06 DIAGNOSIS — N186 End stage renal disease: Secondary | ICD-10-CM | POA: Diagnosis not present

## 2021-01-07 DIAGNOSIS — Z992 Dependence on renal dialysis: Secondary | ICD-10-CM | POA: Diagnosis not present

## 2021-01-07 DIAGNOSIS — N186 End stage renal disease: Secondary | ICD-10-CM | POA: Diagnosis not present

## 2021-01-08 DIAGNOSIS — Z992 Dependence on renal dialysis: Secondary | ICD-10-CM | POA: Diagnosis not present

## 2021-01-08 DIAGNOSIS — N186 End stage renal disease: Secondary | ICD-10-CM | POA: Diagnosis not present

## 2021-01-09 DIAGNOSIS — N186 End stage renal disease: Secondary | ICD-10-CM | POA: Diagnosis not present

## 2021-01-09 DIAGNOSIS — Z992 Dependence on renal dialysis: Secondary | ICD-10-CM | POA: Diagnosis not present

## 2021-01-10 DIAGNOSIS — Z992 Dependence on renal dialysis: Secondary | ICD-10-CM | POA: Diagnosis not present

## 2021-01-10 DIAGNOSIS — N186 End stage renal disease: Secondary | ICD-10-CM | POA: Diagnosis not present

## 2021-01-11 DIAGNOSIS — N186 End stage renal disease: Secondary | ICD-10-CM | POA: Diagnosis not present

## 2021-01-11 DIAGNOSIS — Z992 Dependence on renal dialysis: Secondary | ICD-10-CM | POA: Diagnosis not present

## 2021-01-12 DIAGNOSIS — Z992 Dependence on renal dialysis: Secondary | ICD-10-CM | POA: Diagnosis not present

## 2021-01-12 DIAGNOSIS — N186 End stage renal disease: Secondary | ICD-10-CM | POA: Diagnosis not present

## 2021-01-13 DIAGNOSIS — Z992 Dependence on renal dialysis: Secondary | ICD-10-CM | POA: Diagnosis not present

## 2021-01-13 DIAGNOSIS — N186 End stage renal disease: Secondary | ICD-10-CM | POA: Diagnosis not present

## 2021-01-14 DIAGNOSIS — Z992 Dependence on renal dialysis: Secondary | ICD-10-CM | POA: Diagnosis not present

## 2021-01-14 DIAGNOSIS — N186 End stage renal disease: Secondary | ICD-10-CM | POA: Diagnosis not present

## 2021-01-15 DIAGNOSIS — N186 End stage renal disease: Secondary | ICD-10-CM | POA: Diagnosis not present

## 2021-01-15 DIAGNOSIS — Z992 Dependence on renal dialysis: Secondary | ICD-10-CM | POA: Diagnosis not present

## 2021-01-16 DIAGNOSIS — Z992 Dependence on renal dialysis: Secondary | ICD-10-CM | POA: Diagnosis not present

## 2021-01-16 DIAGNOSIS — N186 End stage renal disease: Secondary | ICD-10-CM | POA: Diagnosis not present

## 2021-01-17 DIAGNOSIS — Z992 Dependence on renal dialysis: Secondary | ICD-10-CM | POA: Diagnosis not present

## 2021-01-17 DIAGNOSIS — N186 End stage renal disease: Secondary | ICD-10-CM | POA: Diagnosis not present

## 2021-01-18 DIAGNOSIS — N186 End stage renal disease: Secondary | ICD-10-CM | POA: Diagnosis not present

## 2021-01-18 DIAGNOSIS — Z992 Dependence on renal dialysis: Secondary | ICD-10-CM | POA: Diagnosis not present

## 2021-01-19 DIAGNOSIS — N186 End stage renal disease: Secondary | ICD-10-CM | POA: Diagnosis not present

## 2021-01-19 DIAGNOSIS — Z992 Dependence on renal dialysis: Secondary | ICD-10-CM | POA: Diagnosis not present

## 2021-01-20 DIAGNOSIS — Z992 Dependence on renal dialysis: Secondary | ICD-10-CM | POA: Diagnosis not present

## 2021-01-20 DIAGNOSIS — N186 End stage renal disease: Secondary | ICD-10-CM | POA: Diagnosis not present

## 2021-01-21 DIAGNOSIS — Z992 Dependence on renal dialysis: Secondary | ICD-10-CM | POA: Diagnosis not present

## 2021-01-21 DIAGNOSIS — N186 End stage renal disease: Secondary | ICD-10-CM | POA: Diagnosis not present

## 2021-01-22 DIAGNOSIS — N186 End stage renal disease: Secondary | ICD-10-CM | POA: Diagnosis not present

## 2021-01-22 DIAGNOSIS — Z992 Dependence on renal dialysis: Secondary | ICD-10-CM | POA: Diagnosis not present

## 2021-01-23 DIAGNOSIS — N186 End stage renal disease: Secondary | ICD-10-CM | POA: Diagnosis not present

## 2021-01-23 DIAGNOSIS — Z992 Dependence on renal dialysis: Secondary | ICD-10-CM | POA: Diagnosis not present

## 2021-01-24 DIAGNOSIS — Z992 Dependence on renal dialysis: Secondary | ICD-10-CM | POA: Diagnosis not present

## 2021-01-24 DIAGNOSIS — N186 End stage renal disease: Secondary | ICD-10-CM | POA: Diagnosis not present

## 2021-01-25 DIAGNOSIS — Z992 Dependence on renal dialysis: Secondary | ICD-10-CM | POA: Diagnosis not present

## 2021-01-25 DIAGNOSIS — N186 End stage renal disease: Secondary | ICD-10-CM | POA: Diagnosis not present

## 2021-01-26 DIAGNOSIS — N186 End stage renal disease: Secondary | ICD-10-CM | POA: Diagnosis not present

## 2021-01-26 DIAGNOSIS — Z992 Dependence on renal dialysis: Secondary | ICD-10-CM | POA: Diagnosis not present

## 2021-01-27 DIAGNOSIS — Z992 Dependence on renal dialysis: Secondary | ICD-10-CM | POA: Diagnosis not present

## 2021-01-27 DIAGNOSIS — N186 End stage renal disease: Secondary | ICD-10-CM | POA: Diagnosis not present

## 2021-01-28 DIAGNOSIS — Z992 Dependence on renal dialysis: Secondary | ICD-10-CM | POA: Diagnosis not present

## 2021-01-28 DIAGNOSIS — N186 End stage renal disease: Secondary | ICD-10-CM | POA: Diagnosis not present

## 2021-01-29 DIAGNOSIS — Z992 Dependence on renal dialysis: Secondary | ICD-10-CM | POA: Diagnosis not present

## 2021-01-29 DIAGNOSIS — N186 End stage renal disease: Secondary | ICD-10-CM | POA: Diagnosis not present

## 2021-01-30 DIAGNOSIS — N186 End stage renal disease: Secondary | ICD-10-CM | POA: Diagnosis not present

## 2021-01-30 DIAGNOSIS — Z992 Dependence on renal dialysis: Secondary | ICD-10-CM | POA: Diagnosis not present

## 2021-01-31 DIAGNOSIS — Z992 Dependence on renal dialysis: Secondary | ICD-10-CM | POA: Diagnosis not present

## 2021-01-31 DIAGNOSIS — N186 End stage renal disease: Secondary | ICD-10-CM | POA: Diagnosis not present

## 2021-02-01 DIAGNOSIS — N186 End stage renal disease: Secondary | ICD-10-CM | POA: Diagnosis not present

## 2021-02-01 DIAGNOSIS — Z992 Dependence on renal dialysis: Secondary | ICD-10-CM | POA: Diagnosis not present

## 2021-02-02 DIAGNOSIS — N186 End stage renal disease: Secondary | ICD-10-CM | POA: Diagnosis not present

## 2021-02-02 DIAGNOSIS — Z992 Dependence on renal dialysis: Secondary | ICD-10-CM | POA: Diagnosis not present

## 2021-02-03 DIAGNOSIS — N186 End stage renal disease: Secondary | ICD-10-CM | POA: Diagnosis not present

## 2021-02-03 DIAGNOSIS — Z992 Dependence on renal dialysis: Secondary | ICD-10-CM | POA: Diagnosis not present

## 2021-02-04 DIAGNOSIS — Z992 Dependence on renal dialysis: Secondary | ICD-10-CM | POA: Diagnosis not present

## 2021-02-04 DIAGNOSIS — N186 End stage renal disease: Secondary | ICD-10-CM | POA: Diagnosis not present

## 2021-02-05 DIAGNOSIS — N186 End stage renal disease: Secondary | ICD-10-CM | POA: Diagnosis not present

## 2021-02-05 DIAGNOSIS — Z992 Dependence on renal dialysis: Secondary | ICD-10-CM | POA: Diagnosis not present

## 2021-02-06 DIAGNOSIS — N186 End stage renal disease: Secondary | ICD-10-CM | POA: Diagnosis not present

## 2021-02-06 DIAGNOSIS — Z992 Dependence on renal dialysis: Secondary | ICD-10-CM | POA: Diagnosis not present

## 2021-02-07 DIAGNOSIS — Z992 Dependence on renal dialysis: Secondary | ICD-10-CM | POA: Diagnosis not present

## 2021-02-07 DIAGNOSIS — N186 End stage renal disease: Secondary | ICD-10-CM | POA: Diagnosis not present

## 2021-02-08 DIAGNOSIS — N186 End stage renal disease: Secondary | ICD-10-CM | POA: Diagnosis not present

## 2021-02-08 DIAGNOSIS — Z992 Dependence on renal dialysis: Secondary | ICD-10-CM | POA: Diagnosis not present

## 2021-02-09 DIAGNOSIS — Z992 Dependence on renal dialysis: Secondary | ICD-10-CM | POA: Diagnosis not present

## 2021-02-09 DIAGNOSIS — N186 End stage renal disease: Secondary | ICD-10-CM | POA: Diagnosis not present

## 2021-02-10 DIAGNOSIS — Z992 Dependence on renal dialysis: Secondary | ICD-10-CM | POA: Diagnosis not present

## 2021-02-10 DIAGNOSIS — N186 End stage renal disease: Secondary | ICD-10-CM | POA: Diagnosis not present

## 2021-02-11 DIAGNOSIS — N186 End stage renal disease: Secondary | ICD-10-CM | POA: Diagnosis not present

## 2021-02-11 DIAGNOSIS — Z992 Dependence on renal dialysis: Secondary | ICD-10-CM | POA: Diagnosis not present

## 2021-02-12 DIAGNOSIS — N186 End stage renal disease: Secondary | ICD-10-CM | POA: Diagnosis not present

## 2021-02-12 DIAGNOSIS — Z992 Dependence on renal dialysis: Secondary | ICD-10-CM | POA: Diagnosis not present

## 2021-02-13 DIAGNOSIS — Z992 Dependence on renal dialysis: Secondary | ICD-10-CM | POA: Diagnosis not present

## 2021-02-13 DIAGNOSIS — N186 End stage renal disease: Secondary | ICD-10-CM | POA: Diagnosis not present

## 2021-02-14 DIAGNOSIS — N186 End stage renal disease: Secondary | ICD-10-CM | POA: Diagnosis not present

## 2021-02-14 DIAGNOSIS — Z992 Dependence on renal dialysis: Secondary | ICD-10-CM | POA: Diagnosis not present

## 2021-02-15 DIAGNOSIS — Z992 Dependence on renal dialysis: Secondary | ICD-10-CM | POA: Diagnosis not present

## 2021-02-15 DIAGNOSIS — N186 End stage renal disease: Secondary | ICD-10-CM | POA: Diagnosis not present

## 2021-02-16 DIAGNOSIS — N186 End stage renal disease: Secondary | ICD-10-CM | POA: Diagnosis not present

## 2021-02-16 DIAGNOSIS — Z992 Dependence on renal dialysis: Secondary | ICD-10-CM | POA: Diagnosis not present

## 2021-02-17 DIAGNOSIS — N186 End stage renal disease: Secondary | ICD-10-CM | POA: Diagnosis not present

## 2021-02-17 DIAGNOSIS — Z992 Dependence on renal dialysis: Secondary | ICD-10-CM | POA: Diagnosis not present

## 2021-02-18 DIAGNOSIS — Z992 Dependence on renal dialysis: Secondary | ICD-10-CM | POA: Diagnosis not present

## 2021-02-18 DIAGNOSIS — N186 End stage renal disease: Secondary | ICD-10-CM | POA: Diagnosis not present

## 2021-02-19 DIAGNOSIS — N186 End stage renal disease: Secondary | ICD-10-CM | POA: Diagnosis not present

## 2021-02-19 DIAGNOSIS — Z992 Dependence on renal dialysis: Secondary | ICD-10-CM | POA: Diagnosis not present

## 2021-02-20 DIAGNOSIS — N186 End stage renal disease: Secondary | ICD-10-CM | POA: Diagnosis not present

## 2021-02-20 DIAGNOSIS — Z992 Dependence on renal dialysis: Secondary | ICD-10-CM | POA: Diagnosis not present

## 2021-02-21 DIAGNOSIS — Z992 Dependence on renal dialysis: Secondary | ICD-10-CM | POA: Diagnosis not present

## 2021-02-21 DIAGNOSIS — N186 End stage renal disease: Secondary | ICD-10-CM | POA: Diagnosis not present

## 2021-02-22 DIAGNOSIS — Z992 Dependence on renal dialysis: Secondary | ICD-10-CM | POA: Diagnosis not present

## 2021-02-22 DIAGNOSIS — N186 End stage renal disease: Secondary | ICD-10-CM | POA: Diagnosis not present

## 2021-02-23 DIAGNOSIS — Z992 Dependence on renal dialysis: Secondary | ICD-10-CM | POA: Diagnosis not present

## 2021-02-23 DIAGNOSIS — N186 End stage renal disease: Secondary | ICD-10-CM | POA: Diagnosis not present

## 2021-02-24 DIAGNOSIS — Z992 Dependence on renal dialysis: Secondary | ICD-10-CM | POA: Diagnosis not present

## 2021-02-24 DIAGNOSIS — N186 End stage renal disease: Secondary | ICD-10-CM | POA: Diagnosis not present

## 2021-02-25 DIAGNOSIS — Z992 Dependence on renal dialysis: Secondary | ICD-10-CM | POA: Diagnosis not present

## 2021-02-25 DIAGNOSIS — N186 End stage renal disease: Secondary | ICD-10-CM | POA: Diagnosis not present

## 2021-02-26 DIAGNOSIS — N186 End stage renal disease: Secondary | ICD-10-CM | POA: Diagnosis not present

## 2021-02-26 DIAGNOSIS — Z992 Dependence on renal dialysis: Secondary | ICD-10-CM | POA: Diagnosis not present

## 2021-02-27 DIAGNOSIS — N186 End stage renal disease: Secondary | ICD-10-CM | POA: Diagnosis not present

## 2021-02-27 DIAGNOSIS — Z992 Dependence on renal dialysis: Secondary | ICD-10-CM | POA: Diagnosis not present

## 2021-02-28 DIAGNOSIS — N186 End stage renal disease: Secondary | ICD-10-CM | POA: Diagnosis not present

## 2021-02-28 DIAGNOSIS — Z992 Dependence on renal dialysis: Secondary | ICD-10-CM | POA: Diagnosis not present

## 2021-03-01 DIAGNOSIS — Z992 Dependence on renal dialysis: Secondary | ICD-10-CM | POA: Diagnosis not present

## 2021-03-01 DIAGNOSIS — N186 End stage renal disease: Secondary | ICD-10-CM | POA: Diagnosis not present

## 2021-03-02 DIAGNOSIS — Z992 Dependence on renal dialysis: Secondary | ICD-10-CM | POA: Diagnosis not present

## 2021-03-02 DIAGNOSIS — N186 End stage renal disease: Secondary | ICD-10-CM | POA: Diagnosis not present

## 2021-03-03 DIAGNOSIS — Z992 Dependence on renal dialysis: Secondary | ICD-10-CM | POA: Diagnosis not present

## 2021-03-03 DIAGNOSIS — N186 End stage renal disease: Secondary | ICD-10-CM | POA: Diagnosis not present

## 2021-03-04 DIAGNOSIS — Z992 Dependence on renal dialysis: Secondary | ICD-10-CM | POA: Diagnosis not present

## 2021-03-04 DIAGNOSIS — N186 End stage renal disease: Secondary | ICD-10-CM | POA: Diagnosis not present

## 2021-03-05 DIAGNOSIS — N186 End stage renal disease: Secondary | ICD-10-CM | POA: Diagnosis not present

## 2021-03-05 DIAGNOSIS — Z992 Dependence on renal dialysis: Secondary | ICD-10-CM | POA: Diagnosis not present

## 2021-03-06 DIAGNOSIS — Z992 Dependence on renal dialysis: Secondary | ICD-10-CM | POA: Diagnosis not present

## 2021-03-06 DIAGNOSIS — N186 End stage renal disease: Secondary | ICD-10-CM | POA: Diagnosis not present

## 2021-03-07 DIAGNOSIS — N186 End stage renal disease: Secondary | ICD-10-CM | POA: Diagnosis not present

## 2021-03-07 DIAGNOSIS — Z992 Dependence on renal dialysis: Secondary | ICD-10-CM | POA: Diagnosis not present

## 2021-03-08 DIAGNOSIS — Z992 Dependence on renal dialysis: Secondary | ICD-10-CM | POA: Diagnosis not present

## 2021-03-08 DIAGNOSIS — N186 End stage renal disease: Secondary | ICD-10-CM | POA: Diagnosis not present

## 2021-03-09 DIAGNOSIS — Z992 Dependence on renal dialysis: Secondary | ICD-10-CM | POA: Diagnosis not present

## 2021-03-09 DIAGNOSIS — N186 End stage renal disease: Secondary | ICD-10-CM | POA: Diagnosis not present

## 2021-03-10 DIAGNOSIS — N186 End stage renal disease: Secondary | ICD-10-CM | POA: Diagnosis not present

## 2021-03-10 DIAGNOSIS — Z992 Dependence on renal dialysis: Secondary | ICD-10-CM | POA: Diagnosis not present

## 2021-03-11 DIAGNOSIS — N186 End stage renal disease: Secondary | ICD-10-CM | POA: Diagnosis not present

## 2021-03-11 DIAGNOSIS — Z992 Dependence on renal dialysis: Secondary | ICD-10-CM | POA: Diagnosis not present

## 2021-03-12 DIAGNOSIS — N186 End stage renal disease: Secondary | ICD-10-CM | POA: Diagnosis not present

## 2021-03-12 DIAGNOSIS — Z992 Dependence on renal dialysis: Secondary | ICD-10-CM | POA: Diagnosis not present

## 2021-03-13 DIAGNOSIS — N186 End stage renal disease: Secondary | ICD-10-CM | POA: Diagnosis not present

## 2021-03-13 DIAGNOSIS — Z992 Dependence on renal dialysis: Secondary | ICD-10-CM | POA: Diagnosis not present

## 2021-03-14 DIAGNOSIS — N186 End stage renal disease: Secondary | ICD-10-CM | POA: Diagnosis not present

## 2021-03-14 DIAGNOSIS — Z992 Dependence on renal dialysis: Secondary | ICD-10-CM | POA: Diagnosis not present

## 2021-03-15 DIAGNOSIS — N186 End stage renal disease: Secondary | ICD-10-CM | POA: Diagnosis not present

## 2021-03-15 DIAGNOSIS — Z992 Dependence on renal dialysis: Secondary | ICD-10-CM | POA: Diagnosis not present

## 2021-03-16 DIAGNOSIS — N186 End stage renal disease: Secondary | ICD-10-CM | POA: Diagnosis not present

## 2021-03-16 DIAGNOSIS — Z992 Dependence on renal dialysis: Secondary | ICD-10-CM | POA: Diagnosis not present

## 2021-03-17 DIAGNOSIS — Z992 Dependence on renal dialysis: Secondary | ICD-10-CM | POA: Diagnosis not present

## 2021-03-17 DIAGNOSIS — N186 End stage renal disease: Secondary | ICD-10-CM | POA: Diagnosis not present

## 2021-03-18 DIAGNOSIS — Z992 Dependence on renal dialysis: Secondary | ICD-10-CM | POA: Diagnosis not present

## 2021-03-18 DIAGNOSIS — N186 End stage renal disease: Secondary | ICD-10-CM | POA: Diagnosis not present

## 2021-03-19 DIAGNOSIS — Z992 Dependence on renal dialysis: Secondary | ICD-10-CM | POA: Diagnosis not present

## 2021-03-19 DIAGNOSIS — N186 End stage renal disease: Secondary | ICD-10-CM | POA: Diagnosis not present

## 2021-03-20 DIAGNOSIS — Z992 Dependence on renal dialysis: Secondary | ICD-10-CM | POA: Diagnosis not present

## 2021-03-20 DIAGNOSIS — N186 End stage renal disease: Secondary | ICD-10-CM | POA: Diagnosis not present

## 2021-03-21 DIAGNOSIS — N186 End stage renal disease: Secondary | ICD-10-CM | POA: Diagnosis not present

## 2021-03-21 DIAGNOSIS — Z992 Dependence on renal dialysis: Secondary | ICD-10-CM | POA: Diagnosis not present

## 2021-03-22 DIAGNOSIS — N186 End stage renal disease: Secondary | ICD-10-CM | POA: Diagnosis not present

## 2021-03-22 DIAGNOSIS — Z992 Dependence on renal dialysis: Secondary | ICD-10-CM | POA: Diagnosis not present

## 2021-03-23 DIAGNOSIS — N186 End stage renal disease: Secondary | ICD-10-CM | POA: Diagnosis not present

## 2021-03-23 DIAGNOSIS — Z992 Dependence on renal dialysis: Secondary | ICD-10-CM | POA: Diagnosis not present

## 2021-03-24 DIAGNOSIS — N186 End stage renal disease: Secondary | ICD-10-CM | POA: Diagnosis not present

## 2021-03-24 DIAGNOSIS — Z992 Dependence on renal dialysis: Secondary | ICD-10-CM | POA: Diagnosis not present

## 2021-03-25 DIAGNOSIS — N186 End stage renal disease: Secondary | ICD-10-CM | POA: Diagnosis not present

## 2021-03-25 DIAGNOSIS — Z298 Encounter for other specified prophylactic measures: Secondary | ICD-10-CM | POA: Diagnosis not present

## 2021-03-25 DIAGNOSIS — Z992 Dependence on renal dialysis: Secondary | ICD-10-CM | POA: Diagnosis not present

## 2021-03-26 DIAGNOSIS — Z992 Dependence on renal dialysis: Secondary | ICD-10-CM | POA: Diagnosis not present

## 2021-03-26 DIAGNOSIS — Z298 Encounter for other specified prophylactic measures: Secondary | ICD-10-CM | POA: Diagnosis not present

## 2021-03-26 DIAGNOSIS — N186 End stage renal disease: Secondary | ICD-10-CM | POA: Diagnosis not present

## 2021-03-27 DIAGNOSIS — Z298 Encounter for other specified prophylactic measures: Secondary | ICD-10-CM | POA: Diagnosis not present

## 2021-03-27 DIAGNOSIS — N186 End stage renal disease: Secondary | ICD-10-CM | POA: Diagnosis not present

## 2021-03-27 DIAGNOSIS — Z992 Dependence on renal dialysis: Secondary | ICD-10-CM | POA: Diagnosis not present

## 2021-03-28 DIAGNOSIS — Z992 Dependence on renal dialysis: Secondary | ICD-10-CM | POA: Diagnosis not present

## 2021-03-28 DIAGNOSIS — N186 End stage renal disease: Secondary | ICD-10-CM | POA: Diagnosis not present

## 2021-03-28 DIAGNOSIS — Z298 Encounter for other specified prophylactic measures: Secondary | ICD-10-CM | POA: Diagnosis not present

## 2021-03-29 DIAGNOSIS — N186 End stage renal disease: Secondary | ICD-10-CM | POA: Diagnosis not present

## 2021-03-29 DIAGNOSIS — Z992 Dependence on renal dialysis: Secondary | ICD-10-CM | POA: Diagnosis not present

## 2021-03-29 DIAGNOSIS — Z298 Encounter for other specified prophylactic measures: Secondary | ICD-10-CM | POA: Diagnosis not present

## 2021-03-30 DIAGNOSIS — Z298 Encounter for other specified prophylactic measures: Secondary | ICD-10-CM | POA: Diagnosis not present

## 2021-03-30 DIAGNOSIS — Z992 Dependence on renal dialysis: Secondary | ICD-10-CM | POA: Diagnosis not present

## 2021-03-30 DIAGNOSIS — N186 End stage renal disease: Secondary | ICD-10-CM | POA: Diagnosis not present

## 2021-03-31 DIAGNOSIS — N186 End stage renal disease: Secondary | ICD-10-CM | POA: Diagnosis not present

## 2021-03-31 DIAGNOSIS — Z298 Encounter for other specified prophylactic measures: Secondary | ICD-10-CM | POA: Diagnosis not present

## 2021-03-31 DIAGNOSIS — Z992 Dependence on renal dialysis: Secondary | ICD-10-CM | POA: Diagnosis not present

## 2021-04-01 DIAGNOSIS — N186 End stage renal disease: Secondary | ICD-10-CM | POA: Diagnosis not present

## 2021-04-01 DIAGNOSIS — Z298 Encounter for other specified prophylactic measures: Secondary | ICD-10-CM | POA: Diagnosis not present

## 2021-04-01 DIAGNOSIS — Z992 Dependence on renal dialysis: Secondary | ICD-10-CM | POA: Diagnosis not present

## 2021-04-02 DIAGNOSIS — N186 End stage renal disease: Secondary | ICD-10-CM | POA: Diagnosis not present

## 2021-04-02 DIAGNOSIS — Z298 Encounter for other specified prophylactic measures: Secondary | ICD-10-CM | POA: Diagnosis not present

## 2021-04-02 DIAGNOSIS — Z992 Dependence on renal dialysis: Secondary | ICD-10-CM | POA: Diagnosis not present

## 2021-04-03 DIAGNOSIS — N186 End stage renal disease: Secondary | ICD-10-CM | POA: Diagnosis not present

## 2021-04-03 DIAGNOSIS — Z298 Encounter for other specified prophylactic measures: Secondary | ICD-10-CM | POA: Diagnosis not present

## 2021-04-03 DIAGNOSIS — Z992 Dependence on renal dialysis: Secondary | ICD-10-CM | POA: Diagnosis not present

## 2021-04-04 DIAGNOSIS — Z992 Dependence on renal dialysis: Secondary | ICD-10-CM | POA: Diagnosis not present

## 2021-04-04 DIAGNOSIS — Z298 Encounter for other specified prophylactic measures: Secondary | ICD-10-CM | POA: Diagnosis not present

## 2021-04-04 DIAGNOSIS — N186 End stage renal disease: Secondary | ICD-10-CM | POA: Diagnosis not present

## 2021-04-05 DIAGNOSIS — Z298 Encounter for other specified prophylactic measures: Secondary | ICD-10-CM | POA: Diagnosis not present

## 2021-04-05 DIAGNOSIS — Z992 Dependence on renal dialysis: Secondary | ICD-10-CM | POA: Diagnosis not present

## 2021-04-05 DIAGNOSIS — N186 End stage renal disease: Secondary | ICD-10-CM | POA: Diagnosis not present

## 2021-04-06 DIAGNOSIS — N186 End stage renal disease: Secondary | ICD-10-CM | POA: Diagnosis not present

## 2021-04-06 DIAGNOSIS — Z992 Dependence on renal dialysis: Secondary | ICD-10-CM | POA: Diagnosis not present

## 2021-04-06 DIAGNOSIS — Z298 Encounter for other specified prophylactic measures: Secondary | ICD-10-CM | POA: Diagnosis not present

## 2021-04-07 DIAGNOSIS — N186 End stage renal disease: Secondary | ICD-10-CM | POA: Diagnosis not present

## 2021-04-07 DIAGNOSIS — Z992 Dependence on renal dialysis: Secondary | ICD-10-CM | POA: Diagnosis not present

## 2021-04-07 DIAGNOSIS — Z298 Encounter for other specified prophylactic measures: Secondary | ICD-10-CM | POA: Diagnosis not present

## 2021-04-08 DIAGNOSIS — Z298 Encounter for other specified prophylactic measures: Secondary | ICD-10-CM | POA: Diagnosis not present

## 2021-04-08 DIAGNOSIS — N186 End stage renal disease: Secondary | ICD-10-CM | POA: Diagnosis not present

## 2021-04-08 DIAGNOSIS — Z992 Dependence on renal dialysis: Secondary | ICD-10-CM | POA: Diagnosis not present

## 2021-04-09 DIAGNOSIS — Z992 Dependence on renal dialysis: Secondary | ICD-10-CM | POA: Diagnosis not present

## 2021-04-09 DIAGNOSIS — Z298 Encounter for other specified prophylactic measures: Secondary | ICD-10-CM | POA: Diagnosis not present

## 2021-04-09 DIAGNOSIS — N186 End stage renal disease: Secondary | ICD-10-CM | POA: Diagnosis not present

## 2021-04-10 DIAGNOSIS — N186 End stage renal disease: Secondary | ICD-10-CM | POA: Diagnosis not present

## 2021-04-10 DIAGNOSIS — Z298 Encounter for other specified prophylactic measures: Secondary | ICD-10-CM | POA: Diagnosis not present

## 2021-04-10 DIAGNOSIS — Z992 Dependence on renal dialysis: Secondary | ICD-10-CM | POA: Diagnosis not present

## 2021-04-11 DIAGNOSIS — N186 End stage renal disease: Secondary | ICD-10-CM | POA: Diagnosis not present

## 2021-04-11 DIAGNOSIS — Z992 Dependence on renal dialysis: Secondary | ICD-10-CM | POA: Diagnosis not present

## 2021-04-11 DIAGNOSIS — Z298 Encounter for other specified prophylactic measures: Secondary | ICD-10-CM | POA: Diagnosis not present

## 2021-04-12 DIAGNOSIS — Z298 Encounter for other specified prophylactic measures: Secondary | ICD-10-CM | POA: Diagnosis not present

## 2021-04-12 DIAGNOSIS — N186 End stage renal disease: Secondary | ICD-10-CM | POA: Diagnosis not present

## 2021-04-12 DIAGNOSIS — Z992 Dependence on renal dialysis: Secondary | ICD-10-CM | POA: Diagnosis not present

## 2021-04-13 DIAGNOSIS — N186 End stage renal disease: Secondary | ICD-10-CM | POA: Diagnosis not present

## 2021-04-13 DIAGNOSIS — Z992 Dependence on renal dialysis: Secondary | ICD-10-CM | POA: Diagnosis not present

## 2021-04-13 DIAGNOSIS — Z298 Encounter for other specified prophylactic measures: Secondary | ICD-10-CM | POA: Diagnosis not present

## 2021-04-14 DIAGNOSIS — Z992 Dependence on renal dialysis: Secondary | ICD-10-CM | POA: Diagnosis not present

## 2021-04-14 DIAGNOSIS — N186 End stage renal disease: Secondary | ICD-10-CM | POA: Diagnosis not present

## 2021-04-14 DIAGNOSIS — Z298 Encounter for other specified prophylactic measures: Secondary | ICD-10-CM | POA: Diagnosis not present

## 2021-04-15 DIAGNOSIS — Z992 Dependence on renal dialysis: Secondary | ICD-10-CM | POA: Diagnosis not present

## 2021-04-15 DIAGNOSIS — N186 End stage renal disease: Secondary | ICD-10-CM | POA: Diagnosis not present

## 2021-04-15 DIAGNOSIS — Z298 Encounter for other specified prophylactic measures: Secondary | ICD-10-CM | POA: Diagnosis not present

## 2021-04-16 DIAGNOSIS — N186 End stage renal disease: Secondary | ICD-10-CM | POA: Diagnosis not present

## 2021-04-16 DIAGNOSIS — Z992 Dependence on renal dialysis: Secondary | ICD-10-CM | POA: Diagnosis not present

## 2021-04-16 DIAGNOSIS — Z298 Encounter for other specified prophylactic measures: Secondary | ICD-10-CM | POA: Diagnosis not present

## 2021-04-17 DIAGNOSIS — Z298 Encounter for other specified prophylactic measures: Secondary | ICD-10-CM | POA: Diagnosis not present

## 2021-04-17 DIAGNOSIS — Z992 Dependence on renal dialysis: Secondary | ICD-10-CM | POA: Diagnosis not present

## 2021-04-17 DIAGNOSIS — N186 End stage renal disease: Secondary | ICD-10-CM | POA: Diagnosis not present

## 2021-04-18 DIAGNOSIS — Z992 Dependence on renal dialysis: Secondary | ICD-10-CM | POA: Diagnosis not present

## 2021-04-18 DIAGNOSIS — N186 End stage renal disease: Secondary | ICD-10-CM | POA: Diagnosis not present

## 2021-04-18 DIAGNOSIS — Z298 Encounter for other specified prophylactic measures: Secondary | ICD-10-CM | POA: Diagnosis not present

## 2021-04-19 DIAGNOSIS — Z992 Dependence on renal dialysis: Secondary | ICD-10-CM | POA: Diagnosis not present

## 2021-04-19 DIAGNOSIS — N186 End stage renal disease: Secondary | ICD-10-CM | POA: Diagnosis not present

## 2021-04-19 DIAGNOSIS — Z298 Encounter for other specified prophylactic measures: Secondary | ICD-10-CM | POA: Diagnosis not present

## 2021-04-20 DIAGNOSIS — Z992 Dependence on renal dialysis: Secondary | ICD-10-CM | POA: Diagnosis not present

## 2021-04-20 DIAGNOSIS — Z298 Encounter for other specified prophylactic measures: Secondary | ICD-10-CM | POA: Diagnosis not present

## 2021-04-20 DIAGNOSIS — N186 End stage renal disease: Secondary | ICD-10-CM | POA: Diagnosis not present

## 2021-04-21 DIAGNOSIS — Z992 Dependence on renal dialysis: Secondary | ICD-10-CM | POA: Diagnosis not present

## 2021-04-21 DIAGNOSIS — Z298 Encounter for other specified prophylactic measures: Secondary | ICD-10-CM | POA: Diagnosis not present

## 2021-04-21 DIAGNOSIS — N186 End stage renal disease: Secondary | ICD-10-CM | POA: Diagnosis not present

## 2021-04-22 DIAGNOSIS — N186 End stage renal disease: Secondary | ICD-10-CM | POA: Diagnosis not present

## 2021-04-22 DIAGNOSIS — Z992 Dependence on renal dialysis: Secondary | ICD-10-CM | POA: Diagnosis not present

## 2021-04-22 DIAGNOSIS — Z298 Encounter for other specified prophylactic measures: Secondary | ICD-10-CM | POA: Diagnosis not present

## 2021-04-23 DIAGNOSIS — Z298 Encounter for other specified prophylactic measures: Secondary | ICD-10-CM | POA: Diagnosis not present

## 2021-04-23 DIAGNOSIS — N186 End stage renal disease: Secondary | ICD-10-CM | POA: Diagnosis not present

## 2021-04-23 DIAGNOSIS — Z992 Dependence on renal dialysis: Secondary | ICD-10-CM | POA: Diagnosis not present

## 2021-04-24 DIAGNOSIS — Z992 Dependence on renal dialysis: Secondary | ICD-10-CM | POA: Diagnosis not present

## 2021-04-24 DIAGNOSIS — N186 End stage renal disease: Secondary | ICD-10-CM | POA: Diagnosis not present

## 2021-04-24 DIAGNOSIS — Z298 Encounter for other specified prophylactic measures: Secondary | ICD-10-CM | POA: Diagnosis not present

## 2021-04-25 DIAGNOSIS — Z992 Dependence on renal dialysis: Secondary | ICD-10-CM | POA: Diagnosis not present

## 2021-04-25 DIAGNOSIS — N186 End stage renal disease: Secondary | ICD-10-CM | POA: Diagnosis not present

## 2021-04-26 DIAGNOSIS — N186 End stage renal disease: Secondary | ICD-10-CM | POA: Diagnosis not present

## 2021-04-26 DIAGNOSIS — Z992 Dependence on renal dialysis: Secondary | ICD-10-CM | POA: Diagnosis not present

## 2021-04-27 DIAGNOSIS — Z992 Dependence on renal dialysis: Secondary | ICD-10-CM | POA: Diagnosis not present

## 2021-04-27 DIAGNOSIS — N186 End stage renal disease: Secondary | ICD-10-CM | POA: Diagnosis not present

## 2021-04-28 DIAGNOSIS — N186 End stage renal disease: Secondary | ICD-10-CM | POA: Diagnosis not present

## 2021-04-28 DIAGNOSIS — Z992 Dependence on renal dialysis: Secondary | ICD-10-CM | POA: Diagnosis not present

## 2021-04-29 DIAGNOSIS — Z992 Dependence on renal dialysis: Secondary | ICD-10-CM | POA: Diagnosis not present

## 2021-04-29 DIAGNOSIS — N186 End stage renal disease: Secondary | ICD-10-CM | POA: Diagnosis not present

## 2021-04-30 DIAGNOSIS — Z992 Dependence on renal dialysis: Secondary | ICD-10-CM | POA: Diagnosis not present

## 2021-04-30 DIAGNOSIS — N186 End stage renal disease: Secondary | ICD-10-CM | POA: Diagnosis not present

## 2021-05-01 DIAGNOSIS — N186 End stage renal disease: Secondary | ICD-10-CM | POA: Diagnosis not present

## 2021-05-01 DIAGNOSIS — Z992 Dependence on renal dialysis: Secondary | ICD-10-CM | POA: Diagnosis not present

## 2021-05-02 DIAGNOSIS — Z992 Dependence on renal dialysis: Secondary | ICD-10-CM | POA: Diagnosis not present

## 2021-05-02 DIAGNOSIS — N186 End stage renal disease: Secondary | ICD-10-CM | POA: Diagnosis not present

## 2021-05-03 DIAGNOSIS — N186 End stage renal disease: Secondary | ICD-10-CM | POA: Diagnosis not present

## 2021-05-03 DIAGNOSIS — Z992 Dependence on renal dialysis: Secondary | ICD-10-CM | POA: Diagnosis not present

## 2021-05-04 DIAGNOSIS — Z992 Dependence on renal dialysis: Secondary | ICD-10-CM | POA: Diagnosis not present

## 2021-05-04 DIAGNOSIS — N186 End stage renal disease: Secondary | ICD-10-CM | POA: Diagnosis not present

## 2021-05-05 DIAGNOSIS — N186 End stage renal disease: Secondary | ICD-10-CM | POA: Diagnosis not present

## 2021-05-05 DIAGNOSIS — Z992 Dependence on renal dialysis: Secondary | ICD-10-CM | POA: Diagnosis not present

## 2021-05-06 DIAGNOSIS — Z992 Dependence on renal dialysis: Secondary | ICD-10-CM | POA: Diagnosis not present

## 2021-05-06 DIAGNOSIS — N186 End stage renal disease: Secondary | ICD-10-CM | POA: Diagnosis not present

## 2021-05-07 DIAGNOSIS — N186 End stage renal disease: Secondary | ICD-10-CM | POA: Diagnosis not present

## 2021-05-07 DIAGNOSIS — Z992 Dependence on renal dialysis: Secondary | ICD-10-CM | POA: Diagnosis not present

## 2021-05-08 DIAGNOSIS — Z992 Dependence on renal dialysis: Secondary | ICD-10-CM | POA: Diagnosis not present

## 2021-05-08 DIAGNOSIS — N186 End stage renal disease: Secondary | ICD-10-CM | POA: Diagnosis not present

## 2021-05-09 DIAGNOSIS — Z992 Dependence on renal dialysis: Secondary | ICD-10-CM | POA: Diagnosis not present

## 2021-05-09 DIAGNOSIS — N186 End stage renal disease: Secondary | ICD-10-CM | POA: Diagnosis not present

## 2021-05-10 DIAGNOSIS — Z992 Dependence on renal dialysis: Secondary | ICD-10-CM | POA: Diagnosis not present

## 2021-05-10 DIAGNOSIS — N186 End stage renal disease: Secondary | ICD-10-CM | POA: Diagnosis not present

## 2021-05-11 DIAGNOSIS — N186 End stage renal disease: Secondary | ICD-10-CM | POA: Diagnosis not present

## 2021-05-11 DIAGNOSIS — Z992 Dependence on renal dialysis: Secondary | ICD-10-CM | POA: Diagnosis not present

## 2021-05-12 DIAGNOSIS — N186 End stage renal disease: Secondary | ICD-10-CM | POA: Diagnosis not present

## 2021-05-12 DIAGNOSIS — Z992 Dependence on renal dialysis: Secondary | ICD-10-CM | POA: Diagnosis not present

## 2021-05-13 DIAGNOSIS — N186 End stage renal disease: Secondary | ICD-10-CM | POA: Diagnosis not present

## 2021-05-13 DIAGNOSIS — Z992 Dependence on renal dialysis: Secondary | ICD-10-CM | POA: Diagnosis not present

## 2021-05-14 DIAGNOSIS — Z992 Dependence on renal dialysis: Secondary | ICD-10-CM | POA: Diagnosis not present

## 2021-05-14 DIAGNOSIS — N186 End stage renal disease: Secondary | ICD-10-CM | POA: Diagnosis not present

## 2021-05-15 DIAGNOSIS — N186 End stage renal disease: Secondary | ICD-10-CM | POA: Diagnosis not present

## 2021-05-15 DIAGNOSIS — Z992 Dependence on renal dialysis: Secondary | ICD-10-CM | POA: Diagnosis not present

## 2021-05-16 DIAGNOSIS — N186 End stage renal disease: Secondary | ICD-10-CM | POA: Diagnosis not present

## 2021-05-16 DIAGNOSIS — Z992 Dependence on renal dialysis: Secondary | ICD-10-CM | POA: Diagnosis not present

## 2021-05-17 DIAGNOSIS — N186 End stage renal disease: Secondary | ICD-10-CM | POA: Diagnosis not present

## 2021-05-17 DIAGNOSIS — Z992 Dependence on renal dialysis: Secondary | ICD-10-CM | POA: Diagnosis not present

## 2021-05-18 DIAGNOSIS — Z992 Dependence on renal dialysis: Secondary | ICD-10-CM | POA: Diagnosis not present

## 2021-05-18 DIAGNOSIS — N186 End stage renal disease: Secondary | ICD-10-CM | POA: Diagnosis not present

## 2021-05-19 DIAGNOSIS — N186 End stage renal disease: Secondary | ICD-10-CM | POA: Diagnosis not present

## 2021-05-19 DIAGNOSIS — Z992 Dependence on renal dialysis: Secondary | ICD-10-CM | POA: Diagnosis not present

## 2021-05-20 DIAGNOSIS — Z992 Dependence on renal dialysis: Secondary | ICD-10-CM | POA: Diagnosis not present

## 2021-05-20 DIAGNOSIS — N186 End stage renal disease: Secondary | ICD-10-CM | POA: Diagnosis not present

## 2021-05-21 DIAGNOSIS — N186 End stage renal disease: Secondary | ICD-10-CM | POA: Diagnosis not present

## 2021-05-21 DIAGNOSIS — Z992 Dependence on renal dialysis: Secondary | ICD-10-CM | POA: Diagnosis not present

## 2021-05-22 DIAGNOSIS — Z992 Dependence on renal dialysis: Secondary | ICD-10-CM | POA: Diagnosis not present

## 2021-05-22 DIAGNOSIS — N186 End stage renal disease: Secondary | ICD-10-CM | POA: Diagnosis not present

## 2021-05-23 DIAGNOSIS — Z992 Dependence on renal dialysis: Secondary | ICD-10-CM | POA: Diagnosis not present

## 2021-05-23 DIAGNOSIS — N186 End stage renal disease: Secondary | ICD-10-CM | POA: Diagnosis not present

## 2021-05-24 DIAGNOSIS — N186 End stage renal disease: Secondary | ICD-10-CM | POA: Diagnosis not present

## 2021-05-24 DIAGNOSIS — Z992 Dependence on renal dialysis: Secondary | ICD-10-CM | POA: Diagnosis not present

## 2021-05-25 DIAGNOSIS — Z992 Dependence on renal dialysis: Secondary | ICD-10-CM | POA: Diagnosis not present

## 2021-05-25 DIAGNOSIS — N186 End stage renal disease: Secondary | ICD-10-CM | POA: Diagnosis not present

## 2021-05-26 DIAGNOSIS — N186 End stage renal disease: Secondary | ICD-10-CM | POA: Diagnosis not present

## 2021-05-26 DIAGNOSIS — Z992 Dependence on renal dialysis: Secondary | ICD-10-CM | POA: Diagnosis not present

## 2021-05-27 DIAGNOSIS — Z992 Dependence on renal dialysis: Secondary | ICD-10-CM | POA: Diagnosis not present

## 2021-05-27 DIAGNOSIS — N186 End stage renal disease: Secondary | ICD-10-CM | POA: Diagnosis not present

## 2021-05-28 DIAGNOSIS — N186 End stage renal disease: Secondary | ICD-10-CM | POA: Diagnosis not present

## 2021-05-28 DIAGNOSIS — Z992 Dependence on renal dialysis: Secondary | ICD-10-CM | POA: Diagnosis not present

## 2021-05-29 DIAGNOSIS — Z992 Dependence on renal dialysis: Secondary | ICD-10-CM | POA: Diagnosis not present

## 2021-05-29 DIAGNOSIS — N186 End stage renal disease: Secondary | ICD-10-CM | POA: Diagnosis not present

## 2021-05-30 DIAGNOSIS — Z992 Dependence on renal dialysis: Secondary | ICD-10-CM | POA: Diagnosis not present

## 2021-05-30 DIAGNOSIS — N186 End stage renal disease: Secondary | ICD-10-CM | POA: Diagnosis not present

## 2021-05-31 DIAGNOSIS — Z992 Dependence on renal dialysis: Secondary | ICD-10-CM | POA: Diagnosis not present

## 2021-05-31 DIAGNOSIS — N186 End stage renal disease: Secondary | ICD-10-CM | POA: Diagnosis not present

## 2021-06-01 DIAGNOSIS — Z992 Dependence on renal dialysis: Secondary | ICD-10-CM | POA: Diagnosis not present

## 2021-06-01 DIAGNOSIS — N186 End stage renal disease: Secondary | ICD-10-CM | POA: Diagnosis not present

## 2021-06-02 DIAGNOSIS — Z992 Dependence on renal dialysis: Secondary | ICD-10-CM | POA: Diagnosis not present

## 2021-06-02 DIAGNOSIS — N186 End stage renal disease: Secondary | ICD-10-CM | POA: Diagnosis not present

## 2021-06-03 DIAGNOSIS — Z992 Dependence on renal dialysis: Secondary | ICD-10-CM | POA: Diagnosis not present

## 2021-06-03 DIAGNOSIS — N186 End stage renal disease: Secondary | ICD-10-CM | POA: Diagnosis not present

## 2021-06-04 DIAGNOSIS — Z992 Dependence on renal dialysis: Secondary | ICD-10-CM | POA: Diagnosis not present

## 2021-06-04 DIAGNOSIS — N186 End stage renal disease: Secondary | ICD-10-CM | POA: Diagnosis not present

## 2021-06-05 DIAGNOSIS — N186 End stage renal disease: Secondary | ICD-10-CM | POA: Diagnosis not present

## 2021-06-05 DIAGNOSIS — Z992 Dependence on renal dialysis: Secondary | ICD-10-CM | POA: Diagnosis not present

## 2021-06-06 DIAGNOSIS — Z992 Dependence on renal dialysis: Secondary | ICD-10-CM | POA: Diagnosis not present

## 2021-06-06 DIAGNOSIS — N186 End stage renal disease: Secondary | ICD-10-CM | POA: Diagnosis not present

## 2021-06-07 DIAGNOSIS — Z992 Dependence on renal dialysis: Secondary | ICD-10-CM | POA: Diagnosis not present

## 2021-06-07 DIAGNOSIS — N186 End stage renal disease: Secondary | ICD-10-CM | POA: Diagnosis not present

## 2021-06-08 DIAGNOSIS — N186 End stage renal disease: Secondary | ICD-10-CM | POA: Diagnosis not present

## 2021-06-08 DIAGNOSIS — Z992 Dependence on renal dialysis: Secondary | ICD-10-CM | POA: Diagnosis not present

## 2021-06-09 DIAGNOSIS — N186 End stage renal disease: Secondary | ICD-10-CM | POA: Diagnosis not present

## 2021-06-09 DIAGNOSIS — Z992 Dependence on renal dialysis: Secondary | ICD-10-CM | POA: Diagnosis not present

## 2021-06-10 DIAGNOSIS — Z992 Dependence on renal dialysis: Secondary | ICD-10-CM | POA: Diagnosis not present

## 2021-06-10 DIAGNOSIS — N186 End stage renal disease: Secondary | ICD-10-CM | POA: Diagnosis not present

## 2021-06-11 DIAGNOSIS — Z992 Dependence on renal dialysis: Secondary | ICD-10-CM | POA: Diagnosis not present

## 2021-06-11 DIAGNOSIS — N186 End stage renal disease: Secondary | ICD-10-CM | POA: Diagnosis not present

## 2021-06-12 DIAGNOSIS — N186 End stage renal disease: Secondary | ICD-10-CM | POA: Diagnosis not present

## 2021-06-12 DIAGNOSIS — Z992 Dependence on renal dialysis: Secondary | ICD-10-CM | POA: Diagnosis not present

## 2021-06-13 DIAGNOSIS — Z992 Dependence on renal dialysis: Secondary | ICD-10-CM | POA: Diagnosis not present

## 2021-06-13 DIAGNOSIS — N186 End stage renal disease: Secondary | ICD-10-CM | POA: Diagnosis not present

## 2021-06-14 DIAGNOSIS — Z992 Dependence on renal dialysis: Secondary | ICD-10-CM | POA: Diagnosis not present

## 2021-06-14 DIAGNOSIS — N186 End stage renal disease: Secondary | ICD-10-CM | POA: Diagnosis not present

## 2021-06-15 DIAGNOSIS — Z992 Dependence on renal dialysis: Secondary | ICD-10-CM | POA: Diagnosis not present

## 2021-06-15 DIAGNOSIS — N186 End stage renal disease: Secondary | ICD-10-CM | POA: Diagnosis not present

## 2021-06-16 DIAGNOSIS — N186 End stage renal disease: Secondary | ICD-10-CM | POA: Diagnosis not present

## 2021-06-16 DIAGNOSIS — Z992 Dependence on renal dialysis: Secondary | ICD-10-CM | POA: Diagnosis not present

## 2021-06-17 DIAGNOSIS — Z992 Dependence on renal dialysis: Secondary | ICD-10-CM | POA: Diagnosis not present

## 2021-06-17 DIAGNOSIS — N186 End stage renal disease: Secondary | ICD-10-CM | POA: Diagnosis not present

## 2021-06-18 DIAGNOSIS — N186 End stage renal disease: Secondary | ICD-10-CM | POA: Diagnosis not present

## 2021-06-18 DIAGNOSIS — Z992 Dependence on renal dialysis: Secondary | ICD-10-CM | POA: Diagnosis not present

## 2021-06-19 DIAGNOSIS — Z992 Dependence on renal dialysis: Secondary | ICD-10-CM | POA: Diagnosis not present

## 2021-06-19 DIAGNOSIS — N186 End stage renal disease: Secondary | ICD-10-CM | POA: Diagnosis not present

## 2021-06-20 DIAGNOSIS — N186 End stage renal disease: Secondary | ICD-10-CM | POA: Diagnosis not present

## 2021-06-20 DIAGNOSIS — Z992 Dependence on renal dialysis: Secondary | ICD-10-CM | POA: Diagnosis not present

## 2021-06-21 DIAGNOSIS — Z992 Dependence on renal dialysis: Secondary | ICD-10-CM | POA: Diagnosis not present

## 2021-06-21 DIAGNOSIS — N186 End stage renal disease: Secondary | ICD-10-CM | POA: Diagnosis not present

## 2021-06-22 ENCOUNTER — Emergency Department (HOSPITAL_COMMUNITY)
Admission: EM | Admit: 2021-06-22 | Discharge: 2021-06-23 | Disposition: A | Discharge status: Home / Self Care | Payer: BC Managed Care – PPO | Attending: Emergency Medicine | Admitting: Emergency Medicine

## 2021-06-22 ENCOUNTER — Other Ambulatory Visit: Payer: Self-pay

## 2021-06-22 ENCOUNTER — Encounter (HOSPITAL_COMMUNITY): Payer: Self-pay

## 2021-06-22 DIAGNOSIS — F17210 Nicotine dependence, cigarettes, uncomplicated: Secondary | ICD-10-CM | POA: Insufficient documentation

## 2021-06-22 DIAGNOSIS — N186 End stage renal disease: Secondary | ICD-10-CM | POA: Diagnosis not present

## 2021-06-22 DIAGNOSIS — R000 Tachycardia, unspecified: Secondary | ICD-10-CM | POA: Diagnosis not present

## 2021-06-22 DIAGNOSIS — L0390 Cellulitis, unspecified: Secondary | ICD-10-CM

## 2021-06-22 DIAGNOSIS — R109 Unspecified abdominal pain: Secondary | ICD-10-CM | POA: Diagnosis not present

## 2021-06-22 DIAGNOSIS — L03311 Cellulitis of abdominal wall: Secondary | ICD-10-CM | POA: Diagnosis not present

## 2021-06-22 DIAGNOSIS — I10 Essential (primary) hypertension: Secondary | ICD-10-CM | POA: Diagnosis not present

## 2021-06-22 DIAGNOSIS — Z992 Dependence on renal dialysis: Secondary | ICD-10-CM | POA: Diagnosis not present

## 2021-06-22 HISTORY — DX: Dependence on renal dialysis: Z99.2

## 2021-06-22 HISTORY — DX: Disorder of thyroid, unspecified: E07.9

## 2021-06-22 HISTORY — DX: Malignant (primary) neoplasm, unspecified: C80.1

## 2021-06-22 HISTORY — DX: Disorder of kidney and ureter, unspecified: N28.9

## 2021-06-22 HISTORY — DX: Essential (primary) hypertension: I10

## 2021-06-22 LAB — URINALYSIS, ROUTINE W REFLEX MICROSCOPIC
Bilirubin Urine: NEGATIVE
Glucose, UA: 150 mg/dL — AB
Hgb urine dipstick: NEGATIVE
Ketones, ur: NEGATIVE mg/dL
Nitrite: NEGATIVE
Protein, ur: 100 mg/dL — AB
Specific Gravity, Urine: 1.019 (ref 1.005–1.030)
Squamous Epithelial / HPF: >50 — ABNORMAL HIGH (ref 0–5)
pH: 7 (ref 5.0–8.0)

## 2021-06-22 LAB — COMPREHENSIVE METABOLIC PANEL
ALT: 10 U/L (ref 0–44)
AST: 13 U/L — ABNORMAL LOW (ref 15–41)
Albumin: 3.9 g/dL (ref 3.5–5.0)
Alkaline Phosphatase: 101 U/L (ref 38–126)
Anion gap: 16 — ABNORMAL HIGH (ref 5–15)
BUN: 57 mg/dL — ABNORMAL HIGH (ref 6–20)
CO2: 21 mmol/L — ABNORMAL LOW (ref 22–32)
Calcium: 9.4 mg/dL (ref 8.9–10.3)
Chloride: 96 mmol/L — ABNORMAL LOW (ref 98–111)
Creatinine, Ser: 15.44 mg/dL — ABNORMAL HIGH (ref 0.44–1.00)
GFR, Estimated: 3 mL/min — ABNORMAL LOW (ref >60)
Glucose, Bld: 148 mg/dL — ABNORMAL HIGH (ref 70–99)
Potassium: 3.9 mmol/L (ref 3.5–5.1)
Sodium: 133 mmol/L — ABNORMAL LOW (ref 135–145)
Total Bilirubin: 0.7 mg/dL (ref 0.3–1.2)
Total Protein: 7.3 g/dL (ref 6.5–8.1)

## 2021-06-22 LAB — CBC WITH DIFFERENTIAL/PLATELET
Abs Immature Granulocytes: 0.06 10*3/uL (ref 0.00–0.07)
Basophils Absolute: 0.1 10*3/uL (ref 0.0–0.1)
Basophils Relative: 0 %
Eosinophils Absolute: 0.3 10*3/uL (ref 0.0–0.5)
Eosinophils Relative: 2 %
HCT: 27.9 % — ABNORMAL LOW (ref 36.0–46.0)
Hemoglobin: 9.7 g/dL — ABNORMAL LOW (ref 12.0–15.0)
Immature Granulocytes: 1 %
Lymphocytes Relative: 6 %
Lymphs Abs: 0.8 10*3/uL (ref 0.7–4.0)
MCH: 33.2 pg (ref 26.0–34.0)
MCHC: 34.8 g/dL (ref 30.0–36.0)
MCV: 95.5 fL (ref 80.0–100.0)
Monocytes Absolute: 0.7 10*3/uL (ref 0.1–1.0)
Monocytes Relative: 5 %
Neutro Abs: 10.9 10*3/uL — ABNORMAL HIGH (ref 1.7–7.7)
Neutrophils Relative %: 86 %
Platelets: 248 10*3/uL (ref 150–400)
RBC: 2.92 MIL/uL — ABNORMAL LOW (ref 3.87–5.11)
RDW: 19.2 % — ABNORMAL HIGH (ref 11.5–15.5)
WBC: 12.7 10*3/uL — ABNORMAL HIGH (ref 4.0–10.5)
nRBC: 0 % (ref 0.0–0.2)

## 2021-06-22 LAB — PREGNANCY, URINE: Preg Test, Ur: NEGATIVE

## 2021-06-22 LAB — LACTIC ACID, PLASMA
Lactic Acid, Venous: 1.9 mmol/L (ref 0.5–1.9)
Lactic Acid, Venous: 2.3 mmol/L (ref 0.5–1.9)

## 2021-06-22 MED ORDER — HYDROCODONE-ACETAMINOPHEN 5-325 MG PO TABS
1.0000 | ORAL_TABLET | Freq: Once | ORAL | Status: AC
Start: 2021-06-23 — End: 2021-06-23
  Administered 2021-06-23: 1 via ORAL
  Filled 2021-06-22: qty 1

## 2021-06-22 MED ORDER — VANCOMYCIN HCL 2000 MG/400ML IV SOLN
2000.0000 mg | Freq: Once | INTRAVENOUS | Status: AC
  Administered 2021-06-22: 2000 mg via INTRAVENOUS
  Filled 2021-06-22: qty 400

## 2021-06-22 MED ORDER — VANCOMYCIN HCL 10 G IV SOLR
2000.0000 mg | Freq: Once | INTRAVENOUS | Status: DC
  Filled 2021-06-22: qty 20

## 2021-06-22 NOTE — ED Provider Notes (Signed)
Emergency Medicine Provider Triage Evaluation Note  Holly [name] , a 35 y.o. female  was evaluated in triage.  Pt complains of skin infection.  Patient is on peritoneal dialysis for 8 hours daily, she is on the transplant list.  She started feeling nauseated and having abdominal pains last night.  This morning she woke up feeling febrile and noticed skin discoloration and warmth surrounding her dialysis port.  Reports the stretch marks have been there, the erythema is what is new.  She also reports noticing discharge.  She reports she has been having fevers, but took 1500 mg of Tylenol earlier today.  She also took her emergency antibiotic dose..  Review of Systems  Positive: Fever, skin infection, abdominal pain Negative: Vomiting, chest pain  Physical Exam  BP (!) 144/94 (BP Location: Right Arm)   Pulse (!) 109   Temp 98 F (36.7 C) (Oral)   Resp 18   Ht 5' 7.5" (1.715 m)   Wt 93.7 kg   LMP 05/23/2021   SpO2 100%   BMI 31.87 kg/m  Gen:   Awake, no distress.  Resp:  Normal effort  MSK:   Moves extremities without difficulty  Other:     Medical Decision Making  Medically screening exam initiated at 2:10 PM.  Appropriate orders placed.  Holly [name] was informed that the remainder of the evaluation will be completed by another provider, this initial triage assessment does not replace that evaluation, and the importance of remaining in the ED until their evaluation is complete.     Holly Raring, PA-C 06/22/21 1412    Varney Biles, MD 06/25/21 1732

## 2021-06-22 NOTE — ED Triage Notes (Signed)
Patient has a dialysis access in her left abdomen and the site is red and hot to touch x 2 days. Patient states she has had one dose of antibiotic.

## 2021-06-22 NOTE — Discharge Instructions (Addendum)
Call your nephrologist tomorrow to coordinate outpatient care plans.  However return back to the ER if:  Your symptoms worsen within the next 12-24 hours. You develop new symptoms such as new fevers, increased redness increased pain or any additional concerns.

## 2021-06-22 NOTE — Progress Notes (Signed)
A consult was received from an ED physician for SBP per pharmacy dosing.  The patient's profile has been reviewed for ht/wt/allergies/indication/available labs.  Patient is on PD. A one time order has been placed for vancomycin 2000 mg.  Further antibiotics/pharmacy consults should be ordered by admitting physician if indicated.                       Thank you, Napoleon Form 06/22/2021  10:24 PM

## 2021-06-22 NOTE — ED Provider Notes (Signed)
[facility] DEPT Provider Note   CSN: [id number] Arrival date & time: 06/22/21  1341     History No chief complaint on file.   Holly [name] is a 35 y.o. female.  Patient presents ER chief complaint of an area of tenderness and cellulitis on her abdomen just medial to her peritoneal dialysis access insertion site.  She noticed it this morning.  She states that she was fine yesterday despite triage notation stating 2 days.  She denies any fevers at home.  She states that the site is tender she is concerned that it might be infected.  She spoke with her dialysis team over the phone and was advised to take antibiotics that she had been given prior.  She took a single dose and was told to go to the ER for further evaluation.  She otherwise denies any fevers or chills.  No vomiting or diarrhea.  She did not perform dialysis today as she has been in the ER for over 7 hours.      Past Medical History:  Diagnosis Date   Cancer [facility])    Dialysis patient [facility])    Hypertension    Renal disorder    Thyroid disease     There are no problems to display for this patient.   Past Surgical History:  Procedure Laterality Date   ABDOMINAL SURGERY     RENAL BIOPSY     x 2   TUMOR REMOVAL     right face     OB History   No obstetric history on file.     Family History  Problem Relation Age of Onset   Multiple sclerosis Mother    Cancer Father    COPD Father     Social History   Tobacco Use   Smoking status: Some Days    Types: Cigarettes   Smokeless tobacco: Never  Vaping Use   Vaping Use: Never used  Substance Use Topics   Alcohol use: Never   Drug use: Never    Home Medications Prior to Admission medications   Medication Sig Start Date End Date Taking? Authorizing Provider  albuterol (VENTOLIN HFA) 108 (90 Base) MCG/ACT inhaler Inhale 1-2 puffs into the lungs every 6 (six) hours as needed for wheezing or shortness of breath. 08/21/19   Loura Halt A, NP  amoxicillin-clavulanate (AUGMENTIN) 875-125 MG tablet Take 1 tablet by mouth every 12 (twelve) hours. 08/21/19   Loura Halt A, NP  benzonatate (TESSALON) 100 MG capsule Take 1 capsule (100 mg total) by mouth every 8 (eight) hours. 08/21/19   Loura Halt A, NP  calcitRIOL (ROCALTROL) 0.5 MCG capsule Take 0.5 mcg by mouth at bedtime.    [provider]  carvedilol (COREG) 12.5 MG tablet Take 12.5 mg by mouth 2 (two) times daily with a meal.    [provider]  cinacalcet (SENSIPAR) 60 MG tablet Take 60 mg by mouth daily.    [provider]  ferric citrate (AURYXIA) 1 GM 210 MG(Fe) tablet Take 630 mg by mouth 3 (three) times daily with meals.    [provider]  furosemide (LASIX) 80 MG tablet Take 80 mg by mouth 2 (two) times daily as needed for fluid.    [provider]  levothyroxine (SYNTHROID) 200 MCG tablet Take 200 mcg by mouth daily before breakfast.    [provider]  loratadine (CLARITIN) 10 MG tablet Take 10 mg by mouth daily.    [provider]  losartan (COZAAR) 100 MG tablet Take 100 mg by mouth daily.    [provider]  multivitamin (RENA-VIT) TABS tablet Take 1 tablet by mouth daily.    [provider]  oxcarbazepine (TRILEPTAL) 600 MG tablet Take 600 mg by mouth 2 (two) times daily.    [provider]  simvastatin (ZOCOR) 20 MG tablet Take 20 mg by mouth at bedtime.    [provider]    Allergies    Bupropion, Ciprofloxacin, Levetiracetam, Oxycodone-acetaminophen, Rituximab, and Tramadol  Review of Systems   Review of Systems  Constitutional:  Negative for fever.  HENT:  Negative for ear pain.   Eyes:  Negative for pain.  Respiratory:  Negative for cough.   Cardiovascular:  Negative for chest pain.  Gastrointestinal:  Positive for abdominal pain.  Genitourinary:  Negative for flank pain.  Musculoskeletal:  Negative for back pain.  Skin:  Positive for rash.   Neurological:  Negative for headaches.   Physical Exam Updated Vital Signs BP (!) 116/95   Pulse (!) 102   Temp 98.7 F (37.1 C) (Oral)   Resp 20   Ht 5' 7.5" (1.715 m)   Wt 93.7 kg   LMP 05/23/2021   SpO2 100%   BMI 31.87 kg/m   Physical Exam Constitutional:      General: She is not in acute distress.    Appearance: Normal appearance.  HENT:     Head: Normocephalic.     Nose: Nose normal.  Eyes:     Extraocular Movements: Extraocular movements intact.  Cardiovascular:     Rate and Rhythm: Normal rate.  Pulmonary:     Effort: Pulmonary effort is normal.  Abdominal:     Comments: Diffusely there is no abdominal tenderness or guarding.  However localizing to the left mid abdominal region there is an area of cellulitis approximately 3 x 4 cm that is tender to palpation.  This does not include the dialysis tubing insertion site which appears clean dry and intact with no purulent discharge and no erythema.  The patch of cellulitis is about 2 cm medial to the insertion site.  Otherwise clinically no other secondary signs of peritonitis no diffuse abdominal tenderness or guarding or peritoneal signs noted.  Musculoskeletal:        General: Normal range of motion.     Cervical back: Normal range of motion.  Neurological:     General: No focal deficit present.     Mental Status: She is alert. Mental status is at baseline.    ED Results / Procedures / Treatments   Labs (all labs ordered are listed, but only abnormal results are displayed) Labs Reviewed  COMPREHENSIVE METABOLIC PANEL - Abnormal; Notable for the following components:      Result Value   Sodium 133 (*)    Chloride 96 (*)    CO2 21 (*)    Glucose, Bld 148 (*)    BUN 57 (*)    Creatinine, Ser 15.44 (*)    AST 13 (*)    GFR, Estimated 3 (*)    Anion gap 16 (*)    All other components within normal limits  CBC WITH DIFFERENTIAL/PLATELET - Abnormal; Notable for the following components:   WBC 12.7 (*)    RBC  2.92 (*)    Hemoglobin 9.7 (*)    HCT 27.9 (*)    RDW 19.2 (*)    Neutro Abs 10.9 (*)    All other components within normal limits  LACTIC ACID, PLASMA - Abnormal; Notable for the following components:   Lactic Acid, Venous 2.3 (*)    All other components within normal limits  URINALYSIS, ROUTINE W REFLEX MICROSCOPIC - Abnormal; Notable for the following components:   APPearance CLOUDY (*)    Glucose, UA 150 (*)    Protein, ur 100 (*)    Leukocytes,Ua LARGE (*)    Bacteria, UA RARE (*)    Squamous Epithelial / LPF >50 (*)    All other components within normal limits  CULTURE, BLOOD (ROUTINE X 2)  CULTURE, BLOOD (ROUTINE X 2)  RESP PANEL BY RT-PCR (FLU A&B, COVID) ARPGX2  LACTIC ACID, PLASMA  PREGNANCY, URINE    EKG None  Radiology No results found.  Procedures Procedures   Medications Ordered in ED Medications  vancomycin (VANCOREADY) IVPB 2000 mg/400 mL (has no administration in time range)    ED Course  I have reviewed the triage vital signs and the nursing notes.  Pertinent labs & imaging results that were available during my care of the patient were reviewed by me and considered in my medical decision making (see chart for details).    MDM Rules/Calculators/A&P                           Patient states that dialysis fluid appears clear does not appear cloudy.  She states that she prefers to go home does not want to be admitted to the hospital.  Labs are sent white count appears consistent with prior values for the patient at 12.7.  Initial lactic was normal 1.9.  Nursing staff sent the second lactic acid which was elevated 2.3.  Significance of this is unclear.  Patient is afebrile here in the ER still.  Case discussed with on-call nephrologist Dr. Johnney Ou, recommends loading dose of vancomycin here and outpatient follow-up for the patient where they can give her vancomycin to instill into the peritoneal fluid.  Patient in agreement with this plan and prefers  this plan.  I advised immediate return to the ER if she runs into any additional complications, has fevers, has increased redness increased pain or any concerns, to return immediately back to the ER.  Final Clinical Impression(s) / ED Diagnoses Final diagnoses:  Cellulitis, unspecified cellulitis site    Rx / DC Orders ED Discharge Orders     None        Luna Fuse, MD 06/22/21 2307

## 2021-06-22 NOTE — ED Notes (Signed)
Pt care taken 

## 2021-06-23 DIAGNOSIS — N186 End stage renal disease: Secondary | ICD-10-CM | POA: Diagnosis not present

## 2021-06-23 DIAGNOSIS — Z992 Dependence on renal dialysis: Secondary | ICD-10-CM | POA: Diagnosis not present

## 2021-06-24 DIAGNOSIS — N186 End stage renal disease: Secondary | ICD-10-CM | POA: Diagnosis not present

## 2021-06-24 DIAGNOSIS — Z992 Dependence on renal dialysis: Secondary | ICD-10-CM | POA: Diagnosis not present

## 2021-06-25 DIAGNOSIS — N186 End stage renal disease: Secondary | ICD-10-CM | POA: Diagnosis not present

## 2021-06-25 DIAGNOSIS — B9562 Methicillin resistant Staphylococcus aureus infection as the cause of diseases classified elsewhere: Secondary | ICD-10-CM | POA: Diagnosis not present

## 2021-06-25 DIAGNOSIS — T8571XA Infection and inflammatory reaction due to peritoneal dialysis catheter, initial encounter: Secondary | ICD-10-CM | POA: Diagnosis not present

## 2021-06-25 DIAGNOSIS — Z23 Encounter for immunization: Secondary | ICD-10-CM | POA: Diagnosis not present

## 2021-06-25 DIAGNOSIS — T85898A Other specified complication of other internal prosthetic devices, implants and grafts, initial encounter: Secondary | ICD-10-CM | POA: Diagnosis not present

## 2021-06-25 DIAGNOSIS — Z992 Dependence on renal dialysis: Secondary | ICD-10-CM | POA: Diagnosis not present

## 2021-06-26 DIAGNOSIS — T85898A Other specified complication of other internal prosthetic devices, implants and grafts, initial encounter: Secondary | ICD-10-CM | POA: Diagnosis not present

## 2021-06-26 DIAGNOSIS — Z992 Dependence on renal dialysis: Secondary | ICD-10-CM | POA: Diagnosis not present

## 2021-06-26 DIAGNOSIS — N186 End stage renal disease: Secondary | ICD-10-CM | POA: Diagnosis not present

## 2021-06-26 DIAGNOSIS — B9562 Methicillin resistant Staphylococcus aureus infection as the cause of diseases classified elsewhere: Secondary | ICD-10-CM | POA: Diagnosis not present

## 2021-06-26 DIAGNOSIS — T8571XA Infection and inflammatory reaction due to peritoneal dialysis catheter, initial encounter: Secondary | ICD-10-CM | POA: Diagnosis not present

## 2021-06-26 DIAGNOSIS — Z23 Encounter for immunization: Secondary | ICD-10-CM | POA: Diagnosis not present

## 2021-06-27 ENCOUNTER — Other Ambulatory Visit (HOSPITAL_COMMUNITY): Payer: Self-pay | Admitting: Nephrology

## 2021-06-27 ENCOUNTER — Ambulatory Visit (HOSPITAL_COMMUNITY)
Admission: RE | Admit: 2021-06-27 | Discharge: 2021-06-27 | Disposition: A | Discharge status: Home / Self Care | Payer: BC Managed Care – PPO | Source: Ambulatory Visit | Attending: Nephrology | Admitting: Nephrology

## 2021-06-27 ENCOUNTER — Other Ambulatory Visit: Payer: Self-pay | Admitting: Nephrology

## 2021-06-27 DIAGNOSIS — N186 End stage renal disease: Secondary | ICD-10-CM

## 2021-06-27 DIAGNOSIS — T85898A Other specified complication of other internal prosthetic devices, implants and grafts, initial encounter: Secondary | ICD-10-CM | POA: Diagnosis not present

## 2021-06-27 DIAGNOSIS — M461 Sacroiliitis, not elsewhere classified: Secondary | ICD-10-CM | POA: Diagnosis not present

## 2021-06-27 DIAGNOSIS — B9562 Methicillin resistant Staphylococcus aureus infection as the cause of diseases classified elsewhere: Secondary | ICD-10-CM | POA: Diagnosis not present

## 2021-06-27 DIAGNOSIS — K6389 Other specified diseases of intestine: Secondary | ICD-10-CM | POA: Diagnosis not present

## 2021-06-27 DIAGNOSIS — Z23 Encounter for immunization: Secondary | ICD-10-CM | POA: Diagnosis not present

## 2021-06-27 DIAGNOSIS — Z992 Dependence on renal dialysis: Secondary | ICD-10-CM | POA: Diagnosis not present

## 2021-06-27 DIAGNOSIS — T8571XA Infection and inflammatory reaction due to peritoneal dialysis catheter, initial encounter: Secondary | ICD-10-CM | POA: Diagnosis not present

## 2021-06-27 DIAGNOSIS — N200 Calculus of kidney: Secondary | ICD-10-CM | POA: Diagnosis not present

## 2021-06-27 DIAGNOSIS — K7689 Other specified diseases of liver: Secondary | ICD-10-CM | POA: Diagnosis not present

## 2021-06-27 LAB — CULTURE, BLOOD (ROUTINE X 2): Culture: NO GROWTH

## 2021-06-28 DIAGNOSIS — Z23 Encounter for immunization: Secondary | ICD-10-CM | POA: Diagnosis not present

## 2021-06-28 DIAGNOSIS — T85898A Other specified complication of other internal prosthetic devices, implants and grafts, initial encounter: Secondary | ICD-10-CM | POA: Diagnosis not present

## 2021-06-28 DIAGNOSIS — N059 Unspecified nephritic syndrome with unspecified morphologic changes: Secondary | ICD-10-CM | POA: Diagnosis not present

## 2021-06-28 DIAGNOSIS — T85611S Breakdown (mechanical) of intraperitoneal dialysis catheter, sequela: Secondary | ICD-10-CM | POA: Diagnosis not present

## 2021-06-28 DIAGNOSIS — Z992 Dependence on renal dialysis: Secondary | ICD-10-CM | POA: Diagnosis not present

## 2021-06-28 DIAGNOSIS — N186 End stage renal disease: Secondary | ICD-10-CM | POA: Diagnosis not present

## 2021-06-28 DIAGNOSIS — B9562 Methicillin resistant Staphylococcus aureus infection as the cause of diseases classified elsewhere: Secondary | ICD-10-CM | POA: Diagnosis not present

## 2021-06-28 DIAGNOSIS — T8571XA Infection and inflammatory reaction due to peritoneal dialysis catheter, initial encounter: Secondary | ICD-10-CM | POA: Diagnosis not present

## 2021-06-28 LAB — CULTURE, BLOOD (ROUTINE X 2)
Culture: NO GROWTH
Special Requests: ADEQUATE

## 2021-06-29 DIAGNOSIS — T8571XA Infection and inflammatory reaction due to peritoneal dialysis catheter, initial encounter: Secondary | ICD-10-CM | POA: Diagnosis not present

## 2021-06-29 DIAGNOSIS — Z23 Encounter for immunization: Secondary | ICD-10-CM | POA: Diagnosis not present

## 2021-06-29 DIAGNOSIS — T85898A Other specified complication of other internal prosthetic devices, implants and grafts, initial encounter: Secondary | ICD-10-CM | POA: Diagnosis not present

## 2021-06-29 DIAGNOSIS — N186 End stage renal disease: Secondary | ICD-10-CM | POA: Diagnosis not present

## 2021-06-29 DIAGNOSIS — B9562 Methicillin resistant Staphylococcus aureus infection as the cause of diseases classified elsewhere: Secondary | ICD-10-CM | POA: Diagnosis not present

## 2021-06-29 DIAGNOSIS — Z992 Dependence on renal dialysis: Secondary | ICD-10-CM | POA: Diagnosis not present

## 2021-06-30 DIAGNOSIS — T85898A Other specified complication of other internal prosthetic devices, implants and grafts, initial encounter: Secondary | ICD-10-CM | POA: Diagnosis not present

## 2021-06-30 DIAGNOSIS — T8571XA Infection and inflammatory reaction due to peritoneal dialysis catheter, initial encounter: Secondary | ICD-10-CM | POA: Diagnosis not present

## 2021-06-30 DIAGNOSIS — Z23 Encounter for immunization: Secondary | ICD-10-CM | POA: Diagnosis not present

## 2021-06-30 DIAGNOSIS — B9562 Methicillin resistant Staphylococcus aureus infection as the cause of diseases classified elsewhere: Secondary | ICD-10-CM | POA: Diagnosis not present

## 2021-06-30 DIAGNOSIS — N186 End stage renal disease: Secondary | ICD-10-CM | POA: Diagnosis not present

## 2021-06-30 DIAGNOSIS — Z992 Dependence on renal dialysis: Secondary | ICD-10-CM | POA: Diagnosis not present

## 2021-07-01 DIAGNOSIS — T85898A Other specified complication of other internal prosthetic devices, implants and grafts, initial encounter: Secondary | ICD-10-CM | POA: Diagnosis not present

## 2021-07-01 DIAGNOSIS — Z992 Dependence on renal dialysis: Secondary | ICD-10-CM | POA: Diagnosis not present

## 2021-07-01 DIAGNOSIS — B9562 Methicillin resistant Staphylococcus aureus infection as the cause of diseases classified elsewhere: Secondary | ICD-10-CM | POA: Diagnosis not present

## 2021-07-01 DIAGNOSIS — T8571XA Infection and inflammatory reaction due to peritoneal dialysis catheter, initial encounter: Secondary | ICD-10-CM | POA: Diagnosis not present

## 2021-07-01 DIAGNOSIS — N186 End stage renal disease: Secondary | ICD-10-CM | POA: Diagnosis not present

## 2021-07-01 DIAGNOSIS — Z23 Encounter for immunization: Secondary | ICD-10-CM | POA: Diagnosis not present

## 2021-07-02 DIAGNOSIS — Z23 Encounter for immunization: Secondary | ICD-10-CM | POA: Diagnosis not present

## 2021-07-02 DIAGNOSIS — T8571XA Infection and inflammatory reaction due to peritoneal dialysis catheter, initial encounter: Secondary | ICD-10-CM | POA: Diagnosis not present

## 2021-07-02 DIAGNOSIS — Z992 Dependence on renal dialysis: Secondary | ICD-10-CM | POA: Diagnosis not present

## 2021-07-02 DIAGNOSIS — T85898A Other specified complication of other internal prosthetic devices, implants and grafts, initial encounter: Secondary | ICD-10-CM | POA: Diagnosis not present

## 2021-07-02 DIAGNOSIS — N186 End stage renal disease: Secondary | ICD-10-CM | POA: Diagnosis not present

## 2021-07-02 DIAGNOSIS — B9562 Methicillin resistant Staphylococcus aureus infection as the cause of diseases classified elsewhere: Secondary | ICD-10-CM | POA: Diagnosis not present

## 2021-07-03 DIAGNOSIS — Z992 Dependence on renal dialysis: Secondary | ICD-10-CM | POA: Diagnosis not present

## 2021-07-03 DIAGNOSIS — N186 End stage renal disease: Secondary | ICD-10-CM | POA: Diagnosis not present

## 2021-07-03 DIAGNOSIS — Z23 Encounter for immunization: Secondary | ICD-10-CM | POA: Diagnosis not present

## 2021-07-03 DIAGNOSIS — T8571XA Infection and inflammatory reaction due to peritoneal dialysis catheter, initial encounter: Secondary | ICD-10-CM | POA: Diagnosis not present

## 2021-07-03 DIAGNOSIS — T85898A Other specified complication of other internal prosthetic devices, implants and grafts, initial encounter: Secondary | ICD-10-CM | POA: Diagnosis not present

## 2021-07-03 DIAGNOSIS — B9562 Methicillin resistant Staphylococcus aureus infection as the cause of diseases classified elsewhere: Secondary | ICD-10-CM | POA: Diagnosis not present

## 2021-07-04 DIAGNOSIS — T85898A Other specified complication of other internal prosthetic devices, implants and grafts, initial encounter: Secondary | ICD-10-CM | POA: Diagnosis not present

## 2021-07-04 DIAGNOSIS — Z992 Dependence on renal dialysis: Secondary | ICD-10-CM | POA: Diagnosis not present

## 2021-07-04 DIAGNOSIS — Z23 Encounter for immunization: Secondary | ICD-10-CM | POA: Diagnosis not present

## 2021-07-04 DIAGNOSIS — B9562 Methicillin resistant Staphylococcus aureus infection as the cause of diseases classified elsewhere: Secondary | ICD-10-CM | POA: Diagnosis not present

## 2021-07-04 DIAGNOSIS — T8571XA Infection and inflammatory reaction due to peritoneal dialysis catheter, initial encounter: Secondary | ICD-10-CM | POA: Diagnosis not present

## 2021-07-04 DIAGNOSIS — N186 End stage renal disease: Secondary | ICD-10-CM | POA: Diagnosis not present

## 2021-07-05 DIAGNOSIS — B9562 Methicillin resistant Staphylococcus aureus infection as the cause of diseases classified elsewhere: Secondary | ICD-10-CM | POA: Diagnosis not present

## 2021-07-05 DIAGNOSIS — T8571XA Infection and inflammatory reaction due to peritoneal dialysis catheter, initial encounter: Secondary | ICD-10-CM | POA: Diagnosis not present

## 2021-07-05 DIAGNOSIS — N186 End stage renal disease: Secondary | ICD-10-CM | POA: Diagnosis not present

## 2021-07-05 DIAGNOSIS — T85898A Other specified complication of other internal prosthetic devices, implants and grafts, initial encounter: Secondary | ICD-10-CM | POA: Diagnosis not present

## 2021-07-05 DIAGNOSIS — Z992 Dependence on renal dialysis: Secondary | ICD-10-CM | POA: Diagnosis not present

## 2021-07-05 DIAGNOSIS — Z23 Encounter for immunization: Secondary | ICD-10-CM | POA: Diagnosis not present

## 2021-07-06 DIAGNOSIS — N186 End stage renal disease: Secondary | ICD-10-CM | POA: Diagnosis not present

## 2021-07-06 DIAGNOSIS — Z992 Dependence on renal dialysis: Secondary | ICD-10-CM | POA: Diagnosis not present

## 2021-07-06 DIAGNOSIS — T85898A Other specified complication of other internal prosthetic devices, implants and grafts, initial encounter: Secondary | ICD-10-CM | POA: Diagnosis not present

## 2021-07-06 DIAGNOSIS — Z23 Encounter for immunization: Secondary | ICD-10-CM | POA: Diagnosis not present

## 2021-07-06 DIAGNOSIS — T8571XA Infection and inflammatory reaction due to peritoneal dialysis catheter, initial encounter: Secondary | ICD-10-CM | POA: Diagnosis not present

## 2021-07-06 DIAGNOSIS — B9562 Methicillin resistant Staphylococcus aureus infection as the cause of diseases classified elsewhere: Secondary | ICD-10-CM | POA: Diagnosis not present

## 2021-07-07 DIAGNOSIS — T8571XA Infection and inflammatory reaction due to peritoneal dialysis catheter, initial encounter: Secondary | ICD-10-CM | POA: Diagnosis not present

## 2021-07-07 DIAGNOSIS — Z992 Dependence on renal dialysis: Secondary | ICD-10-CM | POA: Diagnosis not present

## 2021-07-07 DIAGNOSIS — Z23 Encounter for immunization: Secondary | ICD-10-CM | POA: Diagnosis not present

## 2021-07-07 DIAGNOSIS — T85898A Other specified complication of other internal prosthetic devices, implants and grafts, initial encounter: Secondary | ICD-10-CM | POA: Diagnosis not present

## 2021-07-07 DIAGNOSIS — B9562 Methicillin resistant Staphylococcus aureus infection as the cause of diseases classified elsewhere: Secondary | ICD-10-CM | POA: Diagnosis not present

## 2021-07-07 DIAGNOSIS — N186 End stage renal disease: Secondary | ICD-10-CM | POA: Diagnosis not present

## 2021-07-08 DIAGNOSIS — Z23 Encounter for immunization: Secondary | ICD-10-CM | POA: Diagnosis not present

## 2021-07-08 DIAGNOSIS — Z992 Dependence on renal dialysis: Secondary | ICD-10-CM | POA: Diagnosis not present

## 2021-07-08 DIAGNOSIS — T85898A Other specified complication of other internal prosthetic devices, implants and grafts, initial encounter: Secondary | ICD-10-CM | POA: Diagnosis not present

## 2021-07-08 DIAGNOSIS — T8571XA Infection and inflammatory reaction due to peritoneal dialysis catheter, initial encounter: Secondary | ICD-10-CM | POA: Diagnosis not present

## 2021-07-08 DIAGNOSIS — B9562 Methicillin resistant Staphylococcus aureus infection as the cause of diseases classified elsewhere: Secondary | ICD-10-CM | POA: Diagnosis not present

## 2021-07-08 DIAGNOSIS — N186 End stage renal disease: Secondary | ICD-10-CM | POA: Diagnosis not present

## 2021-07-09 DIAGNOSIS — Z992 Dependence on renal dialysis: Secondary | ICD-10-CM | POA: Diagnosis not present

## 2021-07-09 DIAGNOSIS — N186 End stage renal disease: Secondary | ICD-10-CM | POA: Diagnosis not present

## 2021-07-09 DIAGNOSIS — Z23 Encounter for immunization: Secondary | ICD-10-CM | POA: Diagnosis not present

## 2021-07-09 DIAGNOSIS — B9562 Methicillin resistant Staphylococcus aureus infection as the cause of diseases classified elsewhere: Secondary | ICD-10-CM | POA: Diagnosis not present

## 2021-07-09 DIAGNOSIS — T8571XA Infection and inflammatory reaction due to peritoneal dialysis catheter, initial encounter: Secondary | ICD-10-CM | POA: Diagnosis not present

## 2021-07-09 DIAGNOSIS — T85898A Other specified complication of other internal prosthetic devices, implants and grafts, initial encounter: Secondary | ICD-10-CM | POA: Diagnosis not present

## 2021-07-10 DIAGNOSIS — T8571XA Infection and inflammatory reaction due to peritoneal dialysis catheter, initial encounter: Secondary | ICD-10-CM | POA: Diagnosis not present

## 2021-07-10 DIAGNOSIS — N186 End stage renal disease: Secondary | ICD-10-CM | POA: Diagnosis not present

## 2021-07-10 DIAGNOSIS — B9562 Methicillin resistant Staphylococcus aureus infection as the cause of diseases classified elsewhere: Secondary | ICD-10-CM | POA: Diagnosis not present

## 2021-07-10 DIAGNOSIS — Z992 Dependence on renal dialysis: Secondary | ICD-10-CM | POA: Diagnosis not present

## 2021-07-10 DIAGNOSIS — T85898A Other specified complication of other internal prosthetic devices, implants and grafts, initial encounter: Secondary | ICD-10-CM | POA: Diagnosis not present

## 2021-07-10 DIAGNOSIS — Z23 Encounter for immunization: Secondary | ICD-10-CM | POA: Diagnosis not present

## 2021-07-11 DIAGNOSIS — Z23 Encounter for immunization: Secondary | ICD-10-CM | POA: Diagnosis not present

## 2021-07-11 DIAGNOSIS — T85898A Other specified complication of other internal prosthetic devices, implants and grafts, initial encounter: Secondary | ICD-10-CM | POA: Diagnosis not present

## 2021-07-11 DIAGNOSIS — N186 End stage renal disease: Secondary | ICD-10-CM | POA: Diagnosis not present

## 2021-07-11 DIAGNOSIS — T8571XA Infection and inflammatory reaction due to peritoneal dialysis catheter, initial encounter: Secondary | ICD-10-CM | POA: Diagnosis not present

## 2021-07-11 DIAGNOSIS — B9562 Methicillin resistant Staphylococcus aureus infection as the cause of diseases classified elsewhere: Secondary | ICD-10-CM | POA: Diagnosis not present

## 2021-07-11 DIAGNOSIS — Z992 Dependence on renal dialysis: Secondary | ICD-10-CM | POA: Diagnosis not present

## 2021-07-12 DIAGNOSIS — Z23 Encounter for immunization: Secondary | ICD-10-CM | POA: Diagnosis not present

## 2021-07-12 DIAGNOSIS — N186 End stage renal disease: Secondary | ICD-10-CM | POA: Diagnosis not present

## 2021-07-12 DIAGNOSIS — T8571XA Infection and inflammatory reaction due to peritoneal dialysis catheter, initial encounter: Secondary | ICD-10-CM | POA: Diagnosis not present

## 2021-07-12 DIAGNOSIS — Z992 Dependence on renal dialysis: Secondary | ICD-10-CM | POA: Diagnosis not present

## 2021-07-12 DIAGNOSIS — B9562 Methicillin resistant Staphylococcus aureus infection as the cause of diseases classified elsewhere: Secondary | ICD-10-CM | POA: Diagnosis not present

## 2021-07-12 DIAGNOSIS — T85898A Other specified complication of other internal prosthetic devices, implants and grafts, initial encounter: Secondary | ICD-10-CM | POA: Diagnosis not present

## 2021-07-13 DIAGNOSIS — T8571XA Infection and inflammatory reaction due to peritoneal dialysis catheter, initial encounter: Secondary | ICD-10-CM | POA: Diagnosis not present

## 2021-07-13 DIAGNOSIS — Z23 Encounter for immunization: Secondary | ICD-10-CM | POA: Diagnosis not present

## 2021-07-13 DIAGNOSIS — N186 End stage renal disease: Secondary | ICD-10-CM | POA: Diagnosis not present

## 2021-07-13 DIAGNOSIS — Z992 Dependence on renal dialysis: Secondary | ICD-10-CM | POA: Diagnosis not present

## 2021-07-13 DIAGNOSIS — T85898A Other specified complication of other internal prosthetic devices, implants and grafts, initial encounter: Secondary | ICD-10-CM | POA: Diagnosis not present

## 2021-07-13 DIAGNOSIS — B9562 Methicillin resistant Staphylococcus aureus infection as the cause of diseases classified elsewhere: Secondary | ICD-10-CM | POA: Diagnosis not present

## 2021-07-14 DIAGNOSIS — N186 End stage renal disease: Secondary | ICD-10-CM | POA: Diagnosis not present

## 2021-07-14 DIAGNOSIS — T85898A Other specified complication of other internal prosthetic devices, implants and grafts, initial encounter: Secondary | ICD-10-CM | POA: Diagnosis not present

## 2021-07-14 DIAGNOSIS — B9562 Methicillin resistant Staphylococcus aureus infection as the cause of diseases classified elsewhere: Secondary | ICD-10-CM | POA: Diagnosis not present

## 2021-07-14 DIAGNOSIS — Z992 Dependence on renal dialysis: Secondary | ICD-10-CM | POA: Diagnosis not present

## 2021-07-14 DIAGNOSIS — T8571XA Infection and inflammatory reaction due to peritoneal dialysis catheter, initial encounter: Secondary | ICD-10-CM | POA: Diagnosis not present

## 2021-07-14 DIAGNOSIS — Z23 Encounter for immunization: Secondary | ICD-10-CM | POA: Diagnosis not present

## 2021-07-15 DIAGNOSIS — Z992 Dependence on renal dialysis: Secondary | ICD-10-CM | POA: Diagnosis not present

## 2021-07-15 DIAGNOSIS — T8571XA Infection and inflammatory reaction due to peritoneal dialysis catheter, initial encounter: Secondary | ICD-10-CM | POA: Diagnosis not present

## 2021-07-15 DIAGNOSIS — B9562 Methicillin resistant Staphylococcus aureus infection as the cause of diseases classified elsewhere: Secondary | ICD-10-CM | POA: Diagnosis not present

## 2021-07-15 DIAGNOSIS — Z23 Encounter for immunization: Secondary | ICD-10-CM | POA: Diagnosis not present

## 2021-07-15 DIAGNOSIS — T85898A Other specified complication of other internal prosthetic devices, implants and grafts, initial encounter: Secondary | ICD-10-CM | POA: Diagnosis not present

## 2021-07-15 DIAGNOSIS — N186 End stage renal disease: Secondary | ICD-10-CM | POA: Diagnosis not present

## 2021-07-16 DIAGNOSIS — T8571XA Infection and inflammatory reaction due to peritoneal dialysis catheter, initial encounter: Secondary | ICD-10-CM | POA: Diagnosis not present

## 2021-07-16 DIAGNOSIS — Z992 Dependence on renal dialysis: Secondary | ICD-10-CM | POA: Diagnosis not present

## 2021-07-16 DIAGNOSIS — Z23 Encounter for immunization: Secondary | ICD-10-CM | POA: Diagnosis not present

## 2021-07-16 DIAGNOSIS — N186 End stage renal disease: Secondary | ICD-10-CM | POA: Diagnosis not present

## 2021-07-16 DIAGNOSIS — B9562 Methicillin resistant Staphylococcus aureus infection as the cause of diseases classified elsewhere: Secondary | ICD-10-CM | POA: Diagnosis not present

## 2021-07-16 DIAGNOSIS — T85898A Other specified complication of other internal prosthetic devices, implants and grafts, initial encounter: Secondary | ICD-10-CM | POA: Diagnosis not present

## 2021-07-17 DIAGNOSIS — T8571XA Infection and inflammatory reaction due to peritoneal dialysis catheter, initial encounter: Secondary | ICD-10-CM | POA: Diagnosis not present

## 2021-07-17 DIAGNOSIS — N186 End stage renal disease: Secondary | ICD-10-CM | POA: Diagnosis not present

## 2021-07-17 DIAGNOSIS — T85898A Other specified complication of other internal prosthetic devices, implants and grafts, initial encounter: Secondary | ICD-10-CM | POA: Diagnosis not present

## 2021-07-17 DIAGNOSIS — Z23 Encounter for immunization: Secondary | ICD-10-CM | POA: Diagnosis not present

## 2021-07-17 DIAGNOSIS — B9562 Methicillin resistant Staphylococcus aureus infection as the cause of diseases classified elsewhere: Secondary | ICD-10-CM | POA: Diagnosis not present

## 2021-07-17 DIAGNOSIS — Z992 Dependence on renal dialysis: Secondary | ICD-10-CM | POA: Diagnosis not present

## 2021-07-18 DIAGNOSIS — N186 End stage renal disease: Secondary | ICD-10-CM | POA: Diagnosis not present

## 2021-07-18 DIAGNOSIS — B9562 Methicillin resistant Staphylococcus aureus infection as the cause of diseases classified elsewhere: Secondary | ICD-10-CM | POA: Diagnosis not present

## 2021-07-18 DIAGNOSIS — T8571XA Infection and inflammatory reaction due to peritoneal dialysis catheter, initial encounter: Secondary | ICD-10-CM | POA: Diagnosis not present

## 2021-07-18 DIAGNOSIS — Z23 Encounter for immunization: Secondary | ICD-10-CM | POA: Diagnosis not present

## 2021-07-18 DIAGNOSIS — Z992 Dependence on renal dialysis: Secondary | ICD-10-CM | POA: Diagnosis not present

## 2021-07-18 DIAGNOSIS — T85898A Other specified complication of other internal prosthetic devices, implants and grafts, initial encounter: Secondary | ICD-10-CM | POA: Diagnosis not present

## 2021-07-19 DIAGNOSIS — N186 End stage renal disease: Secondary | ICD-10-CM | POA: Diagnosis not present

## 2021-07-19 DIAGNOSIS — Z992 Dependence on renal dialysis: Secondary | ICD-10-CM | POA: Diagnosis not present

## 2021-07-19 DIAGNOSIS — B9562 Methicillin resistant Staphylococcus aureus infection as the cause of diseases classified elsewhere: Secondary | ICD-10-CM | POA: Diagnosis not present

## 2021-07-19 DIAGNOSIS — Z23 Encounter for immunization: Secondary | ICD-10-CM | POA: Diagnosis not present

## 2021-07-19 DIAGNOSIS — T85898A Other specified complication of other internal prosthetic devices, implants and grafts, initial encounter: Secondary | ICD-10-CM | POA: Diagnosis not present

## 2021-07-19 DIAGNOSIS — T8571XA Infection and inflammatory reaction due to peritoneal dialysis catheter, initial encounter: Secondary | ICD-10-CM | POA: Diagnosis not present

## 2021-07-20 DIAGNOSIS — Z23 Encounter for immunization: Secondary | ICD-10-CM | POA: Diagnosis not present

## 2021-07-20 DIAGNOSIS — T85898A Other specified complication of other internal prosthetic devices, implants and grafts, initial encounter: Secondary | ICD-10-CM | POA: Diagnosis not present

## 2021-07-20 DIAGNOSIS — Z992 Dependence on renal dialysis: Secondary | ICD-10-CM | POA: Diagnosis not present

## 2021-07-20 DIAGNOSIS — B9562 Methicillin resistant Staphylococcus aureus infection as the cause of diseases classified elsewhere: Secondary | ICD-10-CM | POA: Diagnosis not present

## 2021-07-20 DIAGNOSIS — N186 End stage renal disease: Secondary | ICD-10-CM | POA: Diagnosis not present

## 2021-07-20 DIAGNOSIS — T8571XA Infection and inflammatory reaction due to peritoneal dialysis catheter, initial encounter: Secondary | ICD-10-CM | POA: Diagnosis not present

## 2021-07-21 DIAGNOSIS — T8571XA Infection and inflammatory reaction due to peritoneal dialysis catheter, initial encounter: Secondary | ICD-10-CM | POA: Diagnosis not present

## 2021-07-21 DIAGNOSIS — N186 End stage renal disease: Secondary | ICD-10-CM | POA: Diagnosis not present

## 2021-07-21 DIAGNOSIS — B9562 Methicillin resistant Staphylococcus aureus infection as the cause of diseases classified elsewhere: Secondary | ICD-10-CM | POA: Diagnosis not present

## 2021-07-21 DIAGNOSIS — Z992 Dependence on renal dialysis: Secondary | ICD-10-CM | POA: Diagnosis not present

## 2021-07-21 DIAGNOSIS — Z23 Encounter for immunization: Secondary | ICD-10-CM | POA: Diagnosis not present

## 2021-07-21 DIAGNOSIS — T85898A Other specified complication of other internal prosthetic devices, implants and grafts, initial encounter: Secondary | ICD-10-CM | POA: Diagnosis not present

## 2021-07-22 DIAGNOSIS — T8571XA Infection and inflammatory reaction due to peritoneal dialysis catheter, initial encounter: Secondary | ICD-10-CM | POA: Diagnosis not present

## 2021-07-22 DIAGNOSIS — N186 End stage renal disease: Secondary | ICD-10-CM | POA: Diagnosis not present

## 2021-07-22 DIAGNOSIS — T85898A Other specified complication of other internal prosthetic devices, implants and grafts, initial encounter: Secondary | ICD-10-CM | POA: Diagnosis not present

## 2021-07-22 DIAGNOSIS — Z23 Encounter for immunization: Secondary | ICD-10-CM | POA: Diagnosis not present

## 2021-07-22 DIAGNOSIS — B9562 Methicillin resistant Staphylococcus aureus infection as the cause of diseases classified elsewhere: Secondary | ICD-10-CM | POA: Diagnosis not present

## 2021-07-22 DIAGNOSIS — Z992 Dependence on renal dialysis: Secondary | ICD-10-CM | POA: Diagnosis not present

## 2021-07-23 DIAGNOSIS — N186 End stage renal disease: Secondary | ICD-10-CM | POA: Diagnosis not present

## 2021-07-23 DIAGNOSIS — T85898A Other specified complication of other internal prosthetic devices, implants and grafts, initial encounter: Secondary | ICD-10-CM | POA: Diagnosis not present

## 2021-07-23 DIAGNOSIS — T8571XA Infection and inflammatory reaction due to peritoneal dialysis catheter, initial encounter: Secondary | ICD-10-CM | POA: Diagnosis not present

## 2021-07-23 DIAGNOSIS — Z23 Encounter for immunization: Secondary | ICD-10-CM | POA: Diagnosis not present

## 2021-07-23 DIAGNOSIS — B9562 Methicillin resistant Staphylococcus aureus infection as the cause of diseases classified elsewhere: Secondary | ICD-10-CM | POA: Diagnosis not present

## 2021-07-23 DIAGNOSIS — Z992 Dependence on renal dialysis: Secondary | ICD-10-CM | POA: Diagnosis not present

## 2021-07-24 DIAGNOSIS — Z23 Encounter for immunization: Secondary | ICD-10-CM | POA: Diagnosis not present

## 2021-07-24 DIAGNOSIS — T85898A Other specified complication of other internal prosthetic devices, implants and grafts, initial encounter: Secondary | ICD-10-CM | POA: Diagnosis not present

## 2021-07-24 DIAGNOSIS — B9562 Methicillin resistant Staphylococcus aureus infection as the cause of diseases classified elsewhere: Secondary | ICD-10-CM | POA: Diagnosis not present

## 2021-07-24 DIAGNOSIS — Z992 Dependence on renal dialysis: Secondary | ICD-10-CM | POA: Diagnosis not present

## 2021-07-24 DIAGNOSIS — T8571XA Infection and inflammatory reaction due to peritoneal dialysis catheter, initial encounter: Secondary | ICD-10-CM | POA: Diagnosis not present

## 2021-07-24 DIAGNOSIS — N186 End stage renal disease: Secondary | ICD-10-CM | POA: Diagnosis not present

## 2021-07-25 DIAGNOSIS — N186 End stage renal disease: Secondary | ICD-10-CM | POA: Diagnosis not present

## 2021-07-25 DIAGNOSIS — Z992 Dependence on renal dialysis: Secondary | ICD-10-CM | POA: Diagnosis not present

## 2021-07-26 DIAGNOSIS — Z992 Dependence on renal dialysis: Secondary | ICD-10-CM | POA: Diagnosis not present

## 2021-07-26 DIAGNOSIS — N186 End stage renal disease: Secondary | ICD-10-CM | POA: Diagnosis not present

## 2021-07-27 DIAGNOSIS — N186 End stage renal disease: Secondary | ICD-10-CM | POA: Diagnosis not present

## 2021-07-27 DIAGNOSIS — Z992 Dependence on renal dialysis: Secondary | ICD-10-CM | POA: Diagnosis not present

## 2021-07-28 DIAGNOSIS — Z992 Dependence on renal dialysis: Secondary | ICD-10-CM | POA: Diagnosis not present

## 2021-07-28 DIAGNOSIS — N186 End stage renal disease: Secondary | ICD-10-CM | POA: Diagnosis not present

## 2021-07-29 DIAGNOSIS — N186 End stage renal disease: Secondary | ICD-10-CM | POA: Diagnosis not present

## 2021-07-29 DIAGNOSIS — Z992 Dependence on renal dialysis: Secondary | ICD-10-CM | POA: Diagnosis not present

## 2021-07-30 DIAGNOSIS — N186 End stage renal disease: Secondary | ICD-10-CM | POA: Diagnosis not present

## 2021-07-30 DIAGNOSIS — Z992 Dependence on renal dialysis: Secondary | ICD-10-CM | POA: Diagnosis not present

## 2021-07-31 DIAGNOSIS — N186 End stage renal disease: Secondary | ICD-10-CM | POA: Diagnosis not present

## 2021-07-31 DIAGNOSIS — Z992 Dependence on renal dialysis: Secondary | ICD-10-CM | POA: Diagnosis not present

## 2021-08-01 DIAGNOSIS — Z992 Dependence on renal dialysis: Secondary | ICD-10-CM | POA: Diagnosis not present

## 2021-08-01 DIAGNOSIS — N186 End stage renal disease: Secondary | ICD-10-CM | POA: Diagnosis not present

## 2021-08-02 DIAGNOSIS — Z992 Dependence on renal dialysis: Secondary | ICD-10-CM | POA: Diagnosis not present

## 2021-08-02 DIAGNOSIS — N186 End stage renal disease: Secondary | ICD-10-CM | POA: Diagnosis not present

## 2021-08-03 DIAGNOSIS — N186 End stage renal disease: Secondary | ICD-10-CM | POA: Diagnosis not present

## 2021-08-03 DIAGNOSIS — Z992 Dependence on renal dialysis: Secondary | ICD-10-CM | POA: Diagnosis not present

## 2021-08-04 DIAGNOSIS — Z992 Dependence on renal dialysis: Secondary | ICD-10-CM | POA: Diagnosis not present

## 2021-08-04 DIAGNOSIS — N186 End stage renal disease: Secondary | ICD-10-CM | POA: Diagnosis not present

## 2021-08-05 DIAGNOSIS — N186 End stage renal disease: Secondary | ICD-10-CM | POA: Diagnosis not present

## 2021-08-05 DIAGNOSIS — Z992 Dependence on renal dialysis: Secondary | ICD-10-CM | POA: Diagnosis not present

## 2021-08-06 DIAGNOSIS — N186 End stage renal disease: Secondary | ICD-10-CM | POA: Diagnosis not present

## 2021-08-06 DIAGNOSIS — Z992 Dependence on renal dialysis: Secondary | ICD-10-CM | POA: Diagnosis not present

## 2021-08-07 DIAGNOSIS — Z992 Dependence on renal dialysis: Secondary | ICD-10-CM | POA: Diagnosis not present

## 2021-08-07 DIAGNOSIS — N186 End stage renal disease: Secondary | ICD-10-CM | POA: Diagnosis not present

## 2021-08-08 DIAGNOSIS — Z992 Dependence on renal dialysis: Secondary | ICD-10-CM | POA: Diagnosis not present

## 2021-08-08 DIAGNOSIS — N186 End stage renal disease: Secondary | ICD-10-CM | POA: Diagnosis not present

## 2021-08-09 DIAGNOSIS — N186 End stage renal disease: Secondary | ICD-10-CM | POA: Diagnosis not present

## 2021-08-09 DIAGNOSIS — Z992 Dependence on renal dialysis: Secondary | ICD-10-CM | POA: Diagnosis not present

## 2021-08-10 DIAGNOSIS — N186 End stage renal disease: Secondary | ICD-10-CM | POA: Diagnosis not present

## 2021-08-10 DIAGNOSIS — Z992 Dependence on renal dialysis: Secondary | ICD-10-CM | POA: Diagnosis not present

## 2021-08-11 DIAGNOSIS — N186 End stage renal disease: Secondary | ICD-10-CM | POA: Diagnosis not present

## 2021-08-11 DIAGNOSIS — Z992 Dependence on renal dialysis: Secondary | ICD-10-CM | POA: Diagnosis not present

## 2021-08-12 DIAGNOSIS — N186 End stage renal disease: Secondary | ICD-10-CM | POA: Diagnosis not present

## 2021-08-12 DIAGNOSIS — Z992 Dependence on renal dialysis: Secondary | ICD-10-CM | POA: Diagnosis not present

## 2021-08-13 DIAGNOSIS — Z992 Dependence on renal dialysis: Secondary | ICD-10-CM | POA: Diagnosis not present

## 2021-08-13 DIAGNOSIS — N186 End stage renal disease: Secondary | ICD-10-CM | POA: Diagnosis not present

## 2021-08-14 DIAGNOSIS — N186 End stage renal disease: Secondary | ICD-10-CM | POA: Diagnosis not present

## 2021-08-14 DIAGNOSIS — Z992 Dependence on renal dialysis: Secondary | ICD-10-CM | POA: Diagnosis not present

## 2021-08-15 DIAGNOSIS — Z992 Dependence on renal dialysis: Secondary | ICD-10-CM | POA: Diagnosis not present

## 2021-08-15 DIAGNOSIS — N186 End stage renal disease: Secondary | ICD-10-CM | POA: Diagnosis not present

## 2021-08-16 DIAGNOSIS — Z992 Dependence on renal dialysis: Secondary | ICD-10-CM | POA: Diagnosis not present

## 2021-08-16 DIAGNOSIS — N186 End stage renal disease: Secondary | ICD-10-CM | POA: Diagnosis not present

## 2021-08-17 DIAGNOSIS — N186 End stage renal disease: Secondary | ICD-10-CM | POA: Diagnosis not present

## 2021-08-17 DIAGNOSIS — Z992 Dependence on renal dialysis: Secondary | ICD-10-CM | POA: Diagnosis not present

## 2021-08-18 DIAGNOSIS — N186 End stage renal disease: Secondary | ICD-10-CM | POA: Diagnosis not present

## 2021-08-18 DIAGNOSIS — Z992 Dependence on renal dialysis: Secondary | ICD-10-CM | POA: Diagnosis not present

## 2021-08-19 DIAGNOSIS — N186 End stage renal disease: Secondary | ICD-10-CM | POA: Diagnosis not present

## 2021-08-19 DIAGNOSIS — Z992 Dependence on renal dialysis: Secondary | ICD-10-CM | POA: Diagnosis not present

## 2021-08-20 DIAGNOSIS — Z992 Dependence on renal dialysis: Secondary | ICD-10-CM | POA: Diagnosis not present

## 2021-08-20 DIAGNOSIS — N186 End stage renal disease: Secondary | ICD-10-CM | POA: Diagnosis not present

## 2021-08-21 DIAGNOSIS — N186 End stage renal disease: Secondary | ICD-10-CM | POA: Diagnosis not present

## 2021-08-21 DIAGNOSIS — Z992 Dependence on renal dialysis: Secondary | ICD-10-CM | POA: Diagnosis not present

## 2021-08-22 DIAGNOSIS — N186 End stage renal disease: Secondary | ICD-10-CM | POA: Diagnosis not present

## 2021-08-22 DIAGNOSIS — Z992 Dependence on renal dialysis: Secondary | ICD-10-CM | POA: Diagnosis not present

## 2021-08-23 DIAGNOSIS — Z992 Dependence on renal dialysis: Secondary | ICD-10-CM | POA: Diagnosis not present

## 2021-08-23 DIAGNOSIS — N186 End stage renal disease: Secondary | ICD-10-CM | POA: Diagnosis not present

## 2021-08-24 DIAGNOSIS — Z992 Dependence on renal dialysis: Secondary | ICD-10-CM | POA: Diagnosis not present

## 2021-08-24 DIAGNOSIS — N186 End stage renal disease: Secondary | ICD-10-CM | POA: Diagnosis not present

## 2021-08-25 DIAGNOSIS — N186 End stage renal disease: Secondary | ICD-10-CM | POA: Diagnosis not present

## 2021-08-25 DIAGNOSIS — T85848A Pain due to other internal prosthetic devices, implants and grafts, initial encounter: Secondary | ICD-10-CM | POA: Diagnosis not present

## 2021-08-25 DIAGNOSIS — B9561 Methicillin susceptible Staphylococcus aureus infection as the cause of diseases classified elsewhere: Secondary | ICD-10-CM | POA: Diagnosis not present

## 2021-08-25 DIAGNOSIS — T8571XA Infection and inflammatory reaction due to peritoneal dialysis catheter, initial encounter: Secondary | ICD-10-CM | POA: Diagnosis not present

## 2021-08-25 DIAGNOSIS — T85898A Other specified complication of other internal prosthetic devices, implants and grafts, initial encounter: Secondary | ICD-10-CM | POA: Diagnosis not present

## 2021-08-25 DIAGNOSIS — R6883 Chills (without fever): Secondary | ICD-10-CM | POA: Diagnosis not present

## 2021-08-25 DIAGNOSIS — Z992 Dependence on renal dialysis: Secondary | ICD-10-CM | POA: Diagnosis not present

## 2021-08-26 DIAGNOSIS — T8571XA Infection and inflammatory reaction due to peritoneal dialysis catheter, initial encounter: Secondary | ICD-10-CM | POA: Diagnosis not present

## 2021-08-26 DIAGNOSIS — T85848A Pain due to other internal prosthetic devices, implants and grafts, initial encounter: Secondary | ICD-10-CM | POA: Diagnosis not present

## 2021-08-26 DIAGNOSIS — Z992 Dependence on renal dialysis: Secondary | ICD-10-CM | POA: Diagnosis not present

## 2021-08-26 DIAGNOSIS — R6883 Chills (without fever): Secondary | ICD-10-CM | POA: Diagnosis not present

## 2021-08-26 DIAGNOSIS — B9561 Methicillin susceptible Staphylococcus aureus infection as the cause of diseases classified elsewhere: Secondary | ICD-10-CM | POA: Diagnosis not present

## 2021-08-26 DIAGNOSIS — T85898A Other specified complication of other internal prosthetic devices, implants and grafts, initial encounter: Secondary | ICD-10-CM | POA: Diagnosis not present

## 2021-08-26 DIAGNOSIS — N186 End stage renal disease: Secondary | ICD-10-CM | POA: Diagnosis not present

## 2021-08-27 DIAGNOSIS — T85848A Pain due to other internal prosthetic devices, implants and grafts, initial encounter: Secondary | ICD-10-CM | POA: Diagnosis not present

## 2021-08-27 DIAGNOSIS — N186 End stage renal disease: Secondary | ICD-10-CM | POA: Diagnosis not present

## 2021-08-27 DIAGNOSIS — T85898A Other specified complication of other internal prosthetic devices, implants and grafts, initial encounter: Secondary | ICD-10-CM | POA: Diagnosis not present

## 2021-08-27 DIAGNOSIS — B9561 Methicillin susceptible Staphylococcus aureus infection as the cause of diseases classified elsewhere: Secondary | ICD-10-CM | POA: Diagnosis not present

## 2021-08-27 DIAGNOSIS — Z992 Dependence on renal dialysis: Secondary | ICD-10-CM | POA: Diagnosis not present

## 2021-08-27 DIAGNOSIS — T8571XA Infection and inflammatory reaction due to peritoneal dialysis catheter, initial encounter: Secondary | ICD-10-CM | POA: Diagnosis not present

## 2021-08-27 DIAGNOSIS — R6883 Chills (without fever): Secondary | ICD-10-CM | POA: Diagnosis not present

## 2021-08-28 DIAGNOSIS — B9561 Methicillin susceptible Staphylococcus aureus infection as the cause of diseases classified elsewhere: Secondary | ICD-10-CM | POA: Diagnosis not present

## 2021-08-28 DIAGNOSIS — N186 End stage renal disease: Secondary | ICD-10-CM | POA: Diagnosis not present

## 2021-08-28 DIAGNOSIS — T85898A Other specified complication of other internal prosthetic devices, implants and grafts, initial encounter: Secondary | ICD-10-CM | POA: Diagnosis not present

## 2021-08-28 DIAGNOSIS — T8571XA Infection and inflammatory reaction due to peritoneal dialysis catheter, initial encounter: Secondary | ICD-10-CM | POA: Diagnosis not present

## 2021-08-28 DIAGNOSIS — T85848A Pain due to other internal prosthetic devices, implants and grafts, initial encounter: Secondary | ICD-10-CM | POA: Diagnosis not present

## 2021-08-28 DIAGNOSIS — Z992 Dependence on renal dialysis: Secondary | ICD-10-CM | POA: Diagnosis not present

## 2021-08-28 DIAGNOSIS — R6883 Chills (without fever): Secondary | ICD-10-CM | POA: Diagnosis not present

## 2021-08-29 DIAGNOSIS — N186 End stage renal disease: Secondary | ICD-10-CM | POA: Diagnosis not present

## 2021-08-29 DIAGNOSIS — R6883 Chills (without fever): Secondary | ICD-10-CM | POA: Diagnosis not present

## 2021-08-29 DIAGNOSIS — Z992 Dependence on renal dialysis: Secondary | ICD-10-CM | POA: Diagnosis not present

## 2021-08-29 DIAGNOSIS — T85898A Other specified complication of other internal prosthetic devices, implants and grafts, initial encounter: Secondary | ICD-10-CM | POA: Diagnosis not present

## 2021-08-29 DIAGNOSIS — B9561 Methicillin susceptible Staphylococcus aureus infection as the cause of diseases classified elsewhere: Secondary | ICD-10-CM | POA: Diagnosis not present

## 2021-08-29 DIAGNOSIS — T8571XA Infection and inflammatory reaction due to peritoneal dialysis catheter, initial encounter: Secondary | ICD-10-CM | POA: Diagnosis not present

## 2021-08-29 DIAGNOSIS — T85848A Pain due to other internal prosthetic devices, implants and grafts, initial encounter: Secondary | ICD-10-CM | POA: Diagnosis not present

## 2021-08-30 DIAGNOSIS — B9561 Methicillin susceptible Staphylococcus aureus infection as the cause of diseases classified elsewhere: Secondary | ICD-10-CM | POA: Diagnosis not present

## 2021-08-30 DIAGNOSIS — N186 End stage renal disease: Secondary | ICD-10-CM | POA: Diagnosis not present

## 2021-08-30 DIAGNOSIS — Z992 Dependence on renal dialysis: Secondary | ICD-10-CM | POA: Diagnosis not present

## 2021-08-30 DIAGNOSIS — T85848A Pain due to other internal prosthetic devices, implants and grafts, initial encounter: Secondary | ICD-10-CM | POA: Diagnosis not present

## 2021-08-30 DIAGNOSIS — R6883 Chills (without fever): Secondary | ICD-10-CM | POA: Diagnosis not present

## 2021-08-30 DIAGNOSIS — T85898A Other specified complication of other internal prosthetic devices, implants and grafts, initial encounter: Secondary | ICD-10-CM | POA: Diagnosis not present

## 2021-08-30 DIAGNOSIS — T8571XA Infection and inflammatory reaction due to peritoneal dialysis catheter, initial encounter: Secondary | ICD-10-CM | POA: Diagnosis not present

## 2021-08-31 DIAGNOSIS — Z992 Dependence on renal dialysis: Secondary | ICD-10-CM | POA: Diagnosis not present

## 2021-08-31 DIAGNOSIS — N186 End stage renal disease: Secondary | ICD-10-CM | POA: Diagnosis not present

## 2021-08-31 DIAGNOSIS — B9561 Methicillin susceptible Staphylococcus aureus infection as the cause of diseases classified elsewhere: Secondary | ICD-10-CM | POA: Diagnosis not present

## 2021-08-31 DIAGNOSIS — R6883 Chills (without fever): Secondary | ICD-10-CM | POA: Diagnosis not present

## 2021-08-31 DIAGNOSIS — T85848A Pain due to other internal prosthetic devices, implants and grafts, initial encounter: Secondary | ICD-10-CM | POA: Diagnosis not present

## 2021-08-31 DIAGNOSIS — T8571XA Infection and inflammatory reaction due to peritoneal dialysis catheter, initial encounter: Secondary | ICD-10-CM | POA: Diagnosis not present

## 2021-08-31 DIAGNOSIS — T85898A Other specified complication of other internal prosthetic devices, implants and grafts, initial encounter: Secondary | ICD-10-CM | POA: Diagnosis not present

## 2021-09-01 DIAGNOSIS — N186 End stage renal disease: Secondary | ICD-10-CM | POA: Diagnosis not present

## 2021-09-01 DIAGNOSIS — Z992 Dependence on renal dialysis: Secondary | ICD-10-CM | POA: Diagnosis not present

## 2021-09-01 DIAGNOSIS — R6883 Chills (without fever): Secondary | ICD-10-CM | POA: Diagnosis not present

## 2021-09-01 DIAGNOSIS — B9561 Methicillin susceptible Staphylococcus aureus infection as the cause of diseases classified elsewhere: Secondary | ICD-10-CM | POA: Diagnosis not present

## 2021-09-01 DIAGNOSIS — T85898A Other specified complication of other internal prosthetic devices, implants and grafts, initial encounter: Secondary | ICD-10-CM | POA: Diagnosis not present

## 2021-09-01 DIAGNOSIS — T8571XA Infection and inflammatory reaction due to peritoneal dialysis catheter, initial encounter: Secondary | ICD-10-CM | POA: Diagnosis not present

## 2021-09-01 DIAGNOSIS — T85848A Pain due to other internal prosthetic devices, implants and grafts, initial encounter: Secondary | ICD-10-CM | POA: Diagnosis not present

## 2021-09-02 DIAGNOSIS — N186 End stage renal disease: Secondary | ICD-10-CM | POA: Diagnosis not present

## 2021-09-02 DIAGNOSIS — T85898A Other specified complication of other internal prosthetic devices, implants and grafts, initial encounter: Secondary | ICD-10-CM | POA: Diagnosis not present

## 2021-09-02 DIAGNOSIS — Z992 Dependence on renal dialysis: Secondary | ICD-10-CM | POA: Diagnosis not present

## 2021-09-02 DIAGNOSIS — B9561 Methicillin susceptible Staphylococcus aureus infection as the cause of diseases classified elsewhere: Secondary | ICD-10-CM | POA: Diagnosis not present

## 2021-09-02 DIAGNOSIS — R6883 Chills (without fever): Secondary | ICD-10-CM | POA: Diagnosis not present

## 2021-09-02 DIAGNOSIS — T85848A Pain due to other internal prosthetic devices, implants and grafts, initial encounter: Secondary | ICD-10-CM | POA: Diagnosis not present

## 2021-09-02 DIAGNOSIS — T8571XA Infection and inflammatory reaction due to peritoneal dialysis catheter, initial encounter: Secondary | ICD-10-CM | POA: Diagnosis not present

## 2021-09-03 DIAGNOSIS — B9561 Methicillin susceptible Staphylococcus aureus infection as the cause of diseases classified elsewhere: Secondary | ICD-10-CM | POA: Diagnosis not present

## 2021-09-03 DIAGNOSIS — T85848A Pain due to other internal prosthetic devices, implants and grafts, initial encounter: Secondary | ICD-10-CM | POA: Diagnosis not present

## 2021-09-03 DIAGNOSIS — N186 End stage renal disease: Secondary | ICD-10-CM | POA: Diagnosis not present

## 2021-09-03 DIAGNOSIS — R6883 Chills (without fever): Secondary | ICD-10-CM | POA: Diagnosis not present

## 2021-09-03 DIAGNOSIS — Z992 Dependence on renal dialysis: Secondary | ICD-10-CM | POA: Diagnosis not present

## 2021-09-03 DIAGNOSIS — T8571XA Infection and inflammatory reaction due to peritoneal dialysis catheter, initial encounter: Secondary | ICD-10-CM | POA: Diagnosis not present

## 2021-09-03 DIAGNOSIS — T85898A Other specified complication of other internal prosthetic devices, implants and grafts, initial encounter: Secondary | ICD-10-CM | POA: Diagnosis not present

## 2021-09-04 DIAGNOSIS — Z992 Dependence on renal dialysis: Secondary | ICD-10-CM | POA: Diagnosis not present

## 2021-09-04 DIAGNOSIS — T8571XA Infection and inflammatory reaction due to peritoneal dialysis catheter, initial encounter: Secondary | ICD-10-CM | POA: Diagnosis not present

## 2021-09-04 DIAGNOSIS — B9561 Methicillin susceptible Staphylococcus aureus infection as the cause of diseases classified elsewhere: Secondary | ICD-10-CM | POA: Diagnosis not present

## 2021-09-04 DIAGNOSIS — T85898A Other specified complication of other internal prosthetic devices, implants and grafts, initial encounter: Secondary | ICD-10-CM | POA: Diagnosis not present

## 2021-09-04 DIAGNOSIS — N186 End stage renal disease: Secondary | ICD-10-CM | POA: Diagnosis not present

## 2021-09-04 DIAGNOSIS — T85848A Pain due to other internal prosthetic devices, implants and grafts, initial encounter: Secondary | ICD-10-CM | POA: Diagnosis not present

## 2021-09-04 DIAGNOSIS — R6883 Chills (without fever): Secondary | ICD-10-CM | POA: Diagnosis not present

## 2021-09-05 DIAGNOSIS — T85898A Other specified complication of other internal prosthetic devices, implants and grafts, initial encounter: Secondary | ICD-10-CM | POA: Diagnosis not present

## 2021-09-05 DIAGNOSIS — T8571XA Infection and inflammatory reaction due to peritoneal dialysis catheter, initial encounter: Secondary | ICD-10-CM | POA: Diagnosis not present

## 2021-09-05 DIAGNOSIS — N186 End stage renal disease: Secondary | ICD-10-CM | POA: Diagnosis not present

## 2021-09-05 DIAGNOSIS — B9561 Methicillin susceptible Staphylococcus aureus infection as the cause of diseases classified elsewhere: Secondary | ICD-10-CM | POA: Diagnosis not present

## 2021-09-05 DIAGNOSIS — T85848A Pain due to other internal prosthetic devices, implants and grafts, initial encounter: Secondary | ICD-10-CM | POA: Diagnosis not present

## 2021-09-05 DIAGNOSIS — R6883 Chills (without fever): Secondary | ICD-10-CM | POA: Diagnosis not present

## 2021-09-05 DIAGNOSIS — Z992 Dependence on renal dialysis: Secondary | ICD-10-CM | POA: Diagnosis not present

## 2021-09-06 DIAGNOSIS — Z992 Dependence on renal dialysis: Secondary | ICD-10-CM | POA: Diagnosis not present

## 2021-09-06 DIAGNOSIS — R6883 Chills (without fever): Secondary | ICD-10-CM | POA: Diagnosis not present

## 2021-09-06 DIAGNOSIS — T85848A Pain due to other internal prosthetic devices, implants and grafts, initial encounter: Secondary | ICD-10-CM | POA: Diagnosis not present

## 2021-09-06 DIAGNOSIS — B9561 Methicillin susceptible Staphylococcus aureus infection as the cause of diseases classified elsewhere: Secondary | ICD-10-CM | POA: Diagnosis not present

## 2021-09-06 DIAGNOSIS — T8571XA Infection and inflammatory reaction due to peritoneal dialysis catheter, initial encounter: Secondary | ICD-10-CM | POA: Diagnosis not present

## 2021-09-06 DIAGNOSIS — N186 End stage renal disease: Secondary | ICD-10-CM | POA: Diagnosis not present

## 2021-09-06 DIAGNOSIS — T85898A Other specified complication of other internal prosthetic devices, implants and grafts, initial encounter: Secondary | ICD-10-CM | POA: Diagnosis not present

## 2021-09-07 DIAGNOSIS — B9561 Methicillin susceptible Staphylococcus aureus infection as the cause of diseases classified elsewhere: Secondary | ICD-10-CM | POA: Diagnosis not present

## 2021-09-07 DIAGNOSIS — T8571XA Infection and inflammatory reaction due to peritoneal dialysis catheter, initial encounter: Secondary | ICD-10-CM | POA: Diagnosis not present

## 2021-09-07 DIAGNOSIS — Z992 Dependence on renal dialysis: Secondary | ICD-10-CM | POA: Diagnosis not present

## 2021-09-07 DIAGNOSIS — N186 End stage renal disease: Secondary | ICD-10-CM | POA: Diagnosis not present

## 2021-09-07 DIAGNOSIS — T85898A Other specified complication of other internal prosthetic devices, implants and grafts, initial encounter: Secondary | ICD-10-CM | POA: Diagnosis not present

## 2021-09-07 DIAGNOSIS — T85848A Pain due to other internal prosthetic devices, implants and grafts, initial encounter: Secondary | ICD-10-CM | POA: Diagnosis not present

## 2021-09-07 DIAGNOSIS — R6883 Chills (without fever): Secondary | ICD-10-CM | POA: Diagnosis not present

## 2021-09-08 DIAGNOSIS — T85898A Other specified complication of other internal prosthetic devices, implants and grafts, initial encounter: Secondary | ICD-10-CM | POA: Diagnosis not present

## 2021-09-08 DIAGNOSIS — N186 End stage renal disease: Secondary | ICD-10-CM | POA: Diagnosis not present

## 2021-09-08 DIAGNOSIS — T85848A Pain due to other internal prosthetic devices, implants and grafts, initial encounter: Secondary | ICD-10-CM | POA: Diagnosis not present

## 2021-09-08 DIAGNOSIS — Z992 Dependence on renal dialysis: Secondary | ICD-10-CM | POA: Diagnosis not present

## 2021-09-08 DIAGNOSIS — T8571XA Infection and inflammatory reaction due to peritoneal dialysis catheter, initial encounter: Secondary | ICD-10-CM | POA: Diagnosis not present

## 2021-09-08 DIAGNOSIS — B9561 Methicillin susceptible Staphylococcus aureus infection as the cause of diseases classified elsewhere: Secondary | ICD-10-CM | POA: Diagnosis not present

## 2021-09-08 DIAGNOSIS — R6883 Chills (without fever): Secondary | ICD-10-CM | POA: Diagnosis not present

## 2021-09-09 DIAGNOSIS — B9561 Methicillin susceptible Staphylococcus aureus infection as the cause of diseases classified elsewhere: Secondary | ICD-10-CM | POA: Diagnosis not present

## 2021-09-09 DIAGNOSIS — N186 End stage renal disease: Secondary | ICD-10-CM | POA: Diagnosis not present

## 2021-09-09 DIAGNOSIS — T85848A Pain due to other internal prosthetic devices, implants and grafts, initial encounter: Secondary | ICD-10-CM | POA: Diagnosis not present

## 2021-09-09 DIAGNOSIS — Z992 Dependence on renal dialysis: Secondary | ICD-10-CM | POA: Diagnosis not present

## 2021-09-09 DIAGNOSIS — R6883 Chills (without fever): Secondary | ICD-10-CM | POA: Diagnosis not present

## 2021-09-09 DIAGNOSIS — T8571XA Infection and inflammatory reaction due to peritoneal dialysis catheter, initial encounter: Secondary | ICD-10-CM | POA: Diagnosis not present

## 2021-09-09 DIAGNOSIS — T85898A Other specified complication of other internal prosthetic devices, implants and grafts, initial encounter: Secondary | ICD-10-CM | POA: Diagnosis not present

## 2021-09-10 DIAGNOSIS — B9561 Methicillin susceptible Staphylococcus aureus infection as the cause of diseases classified elsewhere: Secondary | ICD-10-CM | POA: Diagnosis not present

## 2021-09-10 DIAGNOSIS — T85848A Pain due to other internal prosthetic devices, implants and grafts, initial encounter: Secondary | ICD-10-CM | POA: Diagnosis not present

## 2021-09-10 DIAGNOSIS — Z992 Dependence on renal dialysis: Secondary | ICD-10-CM | POA: Diagnosis not present

## 2021-09-10 DIAGNOSIS — T8571XA Infection and inflammatory reaction due to peritoneal dialysis catheter, initial encounter: Secondary | ICD-10-CM | POA: Diagnosis not present

## 2021-09-10 DIAGNOSIS — N186 End stage renal disease: Secondary | ICD-10-CM | POA: Diagnosis not present

## 2021-09-10 DIAGNOSIS — T85898A Other specified complication of other internal prosthetic devices, implants and grafts, initial encounter: Secondary | ICD-10-CM | POA: Diagnosis not present

## 2021-09-10 DIAGNOSIS — R6883 Chills (without fever): Secondary | ICD-10-CM | POA: Diagnosis not present

## 2021-09-11 DIAGNOSIS — Z992 Dependence on renal dialysis: Secondary | ICD-10-CM | POA: Diagnosis not present

## 2021-09-11 DIAGNOSIS — T85898A Other specified complication of other internal prosthetic devices, implants and grafts, initial encounter: Secondary | ICD-10-CM | POA: Diagnosis not present

## 2021-09-11 DIAGNOSIS — T8571XA Infection and inflammatory reaction due to peritoneal dialysis catheter, initial encounter: Secondary | ICD-10-CM | POA: Diagnosis not present

## 2021-09-11 DIAGNOSIS — R6883 Chills (without fever): Secondary | ICD-10-CM | POA: Diagnosis not present

## 2021-09-11 DIAGNOSIS — N186 End stage renal disease: Secondary | ICD-10-CM | POA: Diagnosis not present

## 2021-09-11 DIAGNOSIS — T85848A Pain due to other internal prosthetic devices, implants and grafts, initial encounter: Secondary | ICD-10-CM | POA: Diagnosis not present

## 2021-09-11 DIAGNOSIS — B9561 Methicillin susceptible Staphylococcus aureus infection as the cause of diseases classified elsewhere: Secondary | ICD-10-CM | POA: Diagnosis not present

## 2021-09-12 DIAGNOSIS — T85898A Other specified complication of other internal prosthetic devices, implants and grafts, initial encounter: Secondary | ICD-10-CM | POA: Diagnosis not present

## 2021-09-12 DIAGNOSIS — N186 End stage renal disease: Secondary | ICD-10-CM | POA: Diagnosis not present

## 2021-09-12 DIAGNOSIS — T8571XA Infection and inflammatory reaction due to peritoneal dialysis catheter, initial encounter: Secondary | ICD-10-CM | POA: Diagnosis not present

## 2021-09-12 DIAGNOSIS — Z992 Dependence on renal dialysis: Secondary | ICD-10-CM | POA: Diagnosis not present

## 2021-09-12 DIAGNOSIS — T85848A Pain due to other internal prosthetic devices, implants and grafts, initial encounter: Secondary | ICD-10-CM | POA: Diagnosis not present

## 2021-09-12 DIAGNOSIS — B9561 Methicillin susceptible Staphylococcus aureus infection as the cause of diseases classified elsewhere: Secondary | ICD-10-CM | POA: Diagnosis not present

## 2021-09-12 DIAGNOSIS — R6883 Chills (without fever): Secondary | ICD-10-CM | POA: Diagnosis not present

## 2021-09-13 DIAGNOSIS — R6883 Chills (without fever): Secondary | ICD-10-CM | POA: Diagnosis not present

## 2021-09-13 DIAGNOSIS — T85898A Other specified complication of other internal prosthetic devices, implants and grafts, initial encounter: Secondary | ICD-10-CM | POA: Diagnosis not present

## 2021-09-13 DIAGNOSIS — B9561 Methicillin susceptible Staphylococcus aureus infection as the cause of diseases classified elsewhere: Secondary | ICD-10-CM | POA: Diagnosis not present

## 2021-09-13 DIAGNOSIS — N186 End stage renal disease: Secondary | ICD-10-CM | POA: Diagnosis not present

## 2021-09-13 DIAGNOSIS — Z992 Dependence on renal dialysis: Secondary | ICD-10-CM | POA: Diagnosis not present

## 2021-09-13 DIAGNOSIS — T8571XA Infection and inflammatory reaction due to peritoneal dialysis catheter, initial encounter: Secondary | ICD-10-CM | POA: Diagnosis not present

## 2021-09-13 DIAGNOSIS — T85848A Pain due to other internal prosthetic devices, implants and grafts, initial encounter: Secondary | ICD-10-CM | POA: Diagnosis not present

## 2021-09-14 DIAGNOSIS — N186 End stage renal disease: Secondary | ICD-10-CM | POA: Diagnosis not present

## 2021-09-14 DIAGNOSIS — T8571XA Infection and inflammatory reaction due to peritoneal dialysis catheter, initial encounter: Secondary | ICD-10-CM | POA: Diagnosis not present

## 2021-09-14 DIAGNOSIS — T85848A Pain due to other internal prosthetic devices, implants and grafts, initial encounter: Secondary | ICD-10-CM | POA: Diagnosis not present

## 2021-09-14 DIAGNOSIS — R6883 Chills (without fever): Secondary | ICD-10-CM | POA: Diagnosis not present

## 2021-09-14 DIAGNOSIS — B9561 Methicillin susceptible Staphylococcus aureus infection as the cause of diseases classified elsewhere: Secondary | ICD-10-CM | POA: Diagnosis not present

## 2021-09-14 DIAGNOSIS — T85898A Other specified complication of other internal prosthetic devices, implants and grafts, initial encounter: Secondary | ICD-10-CM | POA: Diagnosis not present

## 2021-09-14 DIAGNOSIS — Z992 Dependence on renal dialysis: Secondary | ICD-10-CM | POA: Diagnosis not present

## 2021-09-15 DIAGNOSIS — T85848A Pain due to other internal prosthetic devices, implants and grafts, initial encounter: Secondary | ICD-10-CM | POA: Diagnosis not present

## 2021-09-15 DIAGNOSIS — B9561 Methicillin susceptible Staphylococcus aureus infection as the cause of diseases classified elsewhere: Secondary | ICD-10-CM | POA: Diagnosis not present

## 2021-09-15 DIAGNOSIS — Z992 Dependence on renal dialysis: Secondary | ICD-10-CM | POA: Diagnosis not present

## 2021-09-15 DIAGNOSIS — T85898A Other specified complication of other internal prosthetic devices, implants and grafts, initial encounter: Secondary | ICD-10-CM | POA: Diagnosis not present

## 2021-09-15 DIAGNOSIS — T8571XA Infection and inflammatory reaction due to peritoneal dialysis catheter, initial encounter: Secondary | ICD-10-CM | POA: Diagnosis not present

## 2021-09-15 DIAGNOSIS — N186 End stage renal disease: Secondary | ICD-10-CM | POA: Diagnosis not present

## 2021-09-15 DIAGNOSIS — R6883 Chills (without fever): Secondary | ICD-10-CM | POA: Diagnosis not present

## 2021-09-16 DIAGNOSIS — N186 End stage renal disease: Secondary | ICD-10-CM | POA: Diagnosis not present

## 2021-09-16 DIAGNOSIS — T8571XA Infection and inflammatory reaction due to peritoneal dialysis catheter, initial encounter: Secondary | ICD-10-CM | POA: Diagnosis not present

## 2021-09-16 DIAGNOSIS — Z992 Dependence on renal dialysis: Secondary | ICD-10-CM | POA: Diagnosis not present

## 2021-09-16 DIAGNOSIS — T85898A Other specified complication of other internal prosthetic devices, implants and grafts, initial encounter: Secondary | ICD-10-CM | POA: Diagnosis not present

## 2021-09-16 DIAGNOSIS — T85848A Pain due to other internal prosthetic devices, implants and grafts, initial encounter: Secondary | ICD-10-CM | POA: Diagnosis not present

## 2021-09-16 DIAGNOSIS — R6883 Chills (without fever): Secondary | ICD-10-CM | POA: Diagnosis not present

## 2021-09-16 DIAGNOSIS — B9561 Methicillin susceptible Staphylococcus aureus infection as the cause of diseases classified elsewhere: Secondary | ICD-10-CM | POA: Diagnosis not present

## 2021-09-17 DIAGNOSIS — N186 End stage renal disease: Secondary | ICD-10-CM | POA: Diagnosis not present

## 2021-09-17 DIAGNOSIS — T8571XA Infection and inflammatory reaction due to peritoneal dialysis catheter, initial encounter: Secondary | ICD-10-CM | POA: Diagnosis not present

## 2021-09-17 DIAGNOSIS — Z992 Dependence on renal dialysis: Secondary | ICD-10-CM | POA: Diagnosis not present

## 2021-09-17 DIAGNOSIS — T85898A Other specified complication of other internal prosthetic devices, implants and grafts, initial encounter: Secondary | ICD-10-CM | POA: Diagnosis not present

## 2021-09-17 DIAGNOSIS — B9561 Methicillin susceptible Staphylococcus aureus infection as the cause of diseases classified elsewhere: Secondary | ICD-10-CM | POA: Diagnosis not present

## 2021-09-17 DIAGNOSIS — R6883 Chills (without fever): Secondary | ICD-10-CM | POA: Diagnosis not present

## 2021-09-17 DIAGNOSIS — T85848A Pain due to other internal prosthetic devices, implants and grafts, initial encounter: Secondary | ICD-10-CM | POA: Diagnosis not present

## 2021-09-18 DIAGNOSIS — T85848A Pain due to other internal prosthetic devices, implants and grafts, initial encounter: Secondary | ICD-10-CM | POA: Diagnosis not present

## 2021-09-18 DIAGNOSIS — B9561 Methicillin susceptible Staphylococcus aureus infection as the cause of diseases classified elsewhere: Secondary | ICD-10-CM | POA: Diagnosis not present

## 2021-09-18 DIAGNOSIS — T8571XA Infection and inflammatory reaction due to peritoneal dialysis catheter, initial encounter: Secondary | ICD-10-CM | POA: Diagnosis not present

## 2021-09-18 DIAGNOSIS — T85898A Other specified complication of other internal prosthetic devices, implants and grafts, initial encounter: Secondary | ICD-10-CM | POA: Diagnosis not present

## 2021-09-18 DIAGNOSIS — Z992 Dependence on renal dialysis: Secondary | ICD-10-CM | POA: Diagnosis not present

## 2021-09-18 DIAGNOSIS — N186 End stage renal disease: Secondary | ICD-10-CM | POA: Diagnosis not present

## 2021-09-18 DIAGNOSIS — R6883 Chills (without fever): Secondary | ICD-10-CM | POA: Diagnosis not present

## 2021-09-19 DIAGNOSIS — T8571XA Infection and inflammatory reaction due to peritoneal dialysis catheter, initial encounter: Secondary | ICD-10-CM | POA: Diagnosis not present

## 2021-09-19 DIAGNOSIS — T85898A Other specified complication of other internal prosthetic devices, implants and grafts, initial encounter: Secondary | ICD-10-CM | POA: Diagnosis not present

## 2021-09-19 DIAGNOSIS — N186 End stage renal disease: Secondary | ICD-10-CM | POA: Diagnosis not present

## 2021-09-19 DIAGNOSIS — R6883 Chills (without fever): Secondary | ICD-10-CM | POA: Diagnosis not present

## 2021-09-19 DIAGNOSIS — Z992 Dependence on renal dialysis: Secondary | ICD-10-CM | POA: Diagnosis not present

## 2021-09-19 DIAGNOSIS — B9561 Methicillin susceptible Staphylococcus aureus infection as the cause of diseases classified elsewhere: Secondary | ICD-10-CM | POA: Diagnosis not present

## 2021-09-19 DIAGNOSIS — T85848A Pain due to other internal prosthetic devices, implants and grafts, initial encounter: Secondary | ICD-10-CM | POA: Diagnosis not present

## 2021-09-20 DIAGNOSIS — Z992 Dependence on renal dialysis: Secondary | ICD-10-CM | POA: Diagnosis not present

## 2021-09-20 DIAGNOSIS — T85898A Other specified complication of other internal prosthetic devices, implants and grafts, initial encounter: Secondary | ICD-10-CM | POA: Diagnosis not present

## 2021-09-20 DIAGNOSIS — T8571XA Infection and inflammatory reaction due to peritoneal dialysis catheter, initial encounter: Secondary | ICD-10-CM | POA: Diagnosis not present

## 2021-09-20 DIAGNOSIS — N186 End stage renal disease: Secondary | ICD-10-CM | POA: Diagnosis not present

## 2021-09-20 DIAGNOSIS — B9561 Methicillin susceptible Staphylococcus aureus infection as the cause of diseases classified elsewhere: Secondary | ICD-10-CM | POA: Diagnosis not present

## 2021-09-20 DIAGNOSIS — R6883 Chills (without fever): Secondary | ICD-10-CM | POA: Diagnosis not present

## 2021-09-20 DIAGNOSIS — T85848A Pain due to other internal prosthetic devices, implants and grafts, initial encounter: Secondary | ICD-10-CM | POA: Diagnosis not present

## 2021-09-21 DIAGNOSIS — B9561 Methicillin susceptible Staphylococcus aureus infection as the cause of diseases classified elsewhere: Secondary | ICD-10-CM | POA: Diagnosis not present

## 2021-09-21 DIAGNOSIS — T85898A Other specified complication of other internal prosthetic devices, implants and grafts, initial encounter: Secondary | ICD-10-CM | POA: Diagnosis not present

## 2021-09-21 DIAGNOSIS — T8571XA Infection and inflammatory reaction due to peritoneal dialysis catheter, initial encounter: Secondary | ICD-10-CM | POA: Diagnosis not present

## 2021-09-21 DIAGNOSIS — N186 End stage renal disease: Secondary | ICD-10-CM | POA: Diagnosis not present

## 2021-09-21 DIAGNOSIS — R6883 Chills (without fever): Secondary | ICD-10-CM | POA: Diagnosis not present

## 2021-09-21 DIAGNOSIS — T85848A Pain due to other internal prosthetic devices, implants and grafts, initial encounter: Secondary | ICD-10-CM | POA: Diagnosis not present

## 2021-09-21 DIAGNOSIS — Z992 Dependence on renal dialysis: Secondary | ICD-10-CM | POA: Diagnosis not present

## 2021-09-22 DIAGNOSIS — R6883 Chills (without fever): Secondary | ICD-10-CM | POA: Diagnosis not present

## 2021-09-22 DIAGNOSIS — T8571XA Infection and inflammatory reaction due to peritoneal dialysis catheter, initial encounter: Secondary | ICD-10-CM | POA: Diagnosis not present

## 2021-09-22 DIAGNOSIS — T85898A Other specified complication of other internal prosthetic devices, implants and grafts, initial encounter: Secondary | ICD-10-CM | POA: Diagnosis not present

## 2021-09-22 DIAGNOSIS — T85848A Pain due to other internal prosthetic devices, implants and grafts, initial encounter: Secondary | ICD-10-CM | POA: Diagnosis not present

## 2021-09-22 DIAGNOSIS — N186 End stage renal disease: Secondary | ICD-10-CM | POA: Diagnosis not present

## 2021-09-22 DIAGNOSIS — Z992 Dependence on renal dialysis: Secondary | ICD-10-CM | POA: Diagnosis not present

## 2021-09-22 DIAGNOSIS — B9561 Methicillin susceptible Staphylococcus aureus infection as the cause of diseases classified elsewhere: Secondary | ICD-10-CM | POA: Diagnosis not present

## 2021-09-23 DIAGNOSIS — T85848A Pain due to other internal prosthetic devices, implants and grafts, initial encounter: Secondary | ICD-10-CM | POA: Diagnosis not present

## 2021-09-23 DIAGNOSIS — T85898A Other specified complication of other internal prosthetic devices, implants and grafts, initial encounter: Secondary | ICD-10-CM | POA: Diagnosis not present

## 2021-09-23 DIAGNOSIS — N186 End stage renal disease: Secondary | ICD-10-CM | POA: Diagnosis not present

## 2021-09-23 DIAGNOSIS — R6883 Chills (without fever): Secondary | ICD-10-CM | POA: Diagnosis not present

## 2021-09-23 DIAGNOSIS — Z992 Dependence on renal dialysis: Secondary | ICD-10-CM | POA: Diagnosis not present

## 2021-09-23 DIAGNOSIS — T8571XA Infection and inflammatory reaction due to peritoneal dialysis catheter, initial encounter: Secondary | ICD-10-CM | POA: Diagnosis not present

## 2021-09-23 DIAGNOSIS — B9561 Methicillin susceptible Staphylococcus aureus infection as the cause of diseases classified elsewhere: Secondary | ICD-10-CM | POA: Diagnosis not present

## 2021-09-24 DIAGNOSIS — T85898A Other specified complication of other internal prosthetic devices, implants and grafts, initial encounter: Secondary | ICD-10-CM | POA: Diagnosis not present

## 2021-09-24 DIAGNOSIS — N186 End stage renal disease: Secondary | ICD-10-CM | POA: Diagnosis not present

## 2021-09-24 DIAGNOSIS — R6883 Chills (without fever): Secondary | ICD-10-CM | POA: Diagnosis not present

## 2021-09-24 DIAGNOSIS — T8571XA Infection and inflammatory reaction due to peritoneal dialysis catheter, initial encounter: Secondary | ICD-10-CM | POA: Diagnosis not present

## 2021-09-24 DIAGNOSIS — Z992 Dependence on renal dialysis: Secondary | ICD-10-CM | POA: Diagnosis not present

## 2021-09-24 DIAGNOSIS — B9561 Methicillin susceptible Staphylococcus aureus infection as the cause of diseases classified elsewhere: Secondary | ICD-10-CM | POA: Diagnosis not present

## 2021-09-24 DIAGNOSIS — T85848A Pain due to other internal prosthetic devices, implants and grafts, initial encounter: Secondary | ICD-10-CM | POA: Diagnosis not present

## 2021-09-25 DIAGNOSIS — N186 End stage renal disease: Secondary | ICD-10-CM | POA: Diagnosis not present

## 2021-09-25 DIAGNOSIS — T8571XA Infection and inflammatory reaction due to peritoneal dialysis catheter, initial encounter: Secondary | ICD-10-CM | POA: Diagnosis not present

## 2021-09-25 DIAGNOSIS — B9561 Methicillin susceptible Staphylococcus aureus infection as the cause of diseases classified elsewhere: Secondary | ICD-10-CM | POA: Diagnosis not present

## 2021-09-25 DIAGNOSIS — Z992 Dependence on renal dialysis: Secondary | ICD-10-CM | POA: Diagnosis not present

## 2021-09-26 DIAGNOSIS — B9561 Methicillin susceptible Staphylococcus aureus infection as the cause of diseases classified elsewhere: Secondary | ICD-10-CM | POA: Diagnosis not present

## 2021-09-26 DIAGNOSIS — T8571XA Infection and inflammatory reaction due to peritoneal dialysis catheter, initial encounter: Secondary | ICD-10-CM | POA: Diagnosis not present

## 2021-09-26 DIAGNOSIS — N186 End stage renal disease: Secondary | ICD-10-CM | POA: Diagnosis not present

## 2021-09-26 DIAGNOSIS — Z992 Dependence on renal dialysis: Secondary | ICD-10-CM | POA: Diagnosis not present

## 2021-09-27 DIAGNOSIS — T8571XA Infection and inflammatory reaction due to peritoneal dialysis catheter, initial encounter: Secondary | ICD-10-CM | POA: Diagnosis not present

## 2021-09-27 DIAGNOSIS — B9561 Methicillin susceptible Staphylococcus aureus infection as the cause of diseases classified elsewhere: Secondary | ICD-10-CM | POA: Diagnosis not present

## 2021-09-27 DIAGNOSIS — Z992 Dependence on renal dialysis: Secondary | ICD-10-CM | POA: Diagnosis not present

## 2021-09-27 DIAGNOSIS — N186 End stage renal disease: Secondary | ICD-10-CM | POA: Diagnosis not present

## 2021-09-28 DIAGNOSIS — N186 End stage renal disease: Secondary | ICD-10-CM | POA: Diagnosis not present

## 2021-09-28 DIAGNOSIS — Z992 Dependence on renal dialysis: Secondary | ICD-10-CM | POA: Diagnosis not present

## 2021-09-28 DIAGNOSIS — T8571XA Infection and inflammatory reaction due to peritoneal dialysis catheter, initial encounter: Secondary | ICD-10-CM | POA: Diagnosis not present

## 2021-09-28 DIAGNOSIS — B9561 Methicillin susceptible Staphylococcus aureus infection as the cause of diseases classified elsewhere: Secondary | ICD-10-CM | POA: Diagnosis not present

## 2021-09-29 DIAGNOSIS — T8571XA Infection and inflammatory reaction due to peritoneal dialysis catheter, initial encounter: Secondary | ICD-10-CM | POA: Diagnosis not present

## 2021-09-29 DIAGNOSIS — Z992 Dependence on renal dialysis: Secondary | ICD-10-CM | POA: Diagnosis not present

## 2021-09-29 DIAGNOSIS — B9561 Methicillin susceptible Staphylococcus aureus infection as the cause of diseases classified elsewhere: Secondary | ICD-10-CM | POA: Diagnosis not present

## 2021-09-29 DIAGNOSIS — N186 End stage renal disease: Secondary | ICD-10-CM | POA: Diagnosis not present

## 2021-09-30 DIAGNOSIS — Z992 Dependence on renal dialysis: Secondary | ICD-10-CM | POA: Diagnosis not present

## 2021-09-30 DIAGNOSIS — N186 End stage renal disease: Secondary | ICD-10-CM | POA: Diagnosis not present

## 2021-10-01 DIAGNOSIS — N186 End stage renal disease: Secondary | ICD-10-CM | POA: Diagnosis not present

## 2021-10-01 DIAGNOSIS — Z992 Dependence on renal dialysis: Secondary | ICD-10-CM | POA: Diagnosis not present

## 2021-10-02 DIAGNOSIS — N186 End stage renal disease: Secondary | ICD-10-CM | POA: Diagnosis not present

## 2021-10-02 DIAGNOSIS — Z992 Dependence on renal dialysis: Secondary | ICD-10-CM | POA: Diagnosis not present

## 2021-10-03 DIAGNOSIS — Z992 Dependence on renal dialysis: Secondary | ICD-10-CM | POA: Diagnosis not present

## 2021-10-03 DIAGNOSIS — N186 End stage renal disease: Secondary | ICD-10-CM | POA: Diagnosis not present

## 2021-10-04 DIAGNOSIS — Z4902 Encounter for fitting and adjustment of peritoneal dialysis catheter: Secondary | ICD-10-CM | POA: Diagnosis not present

## 2021-10-04 DIAGNOSIS — T85611A Breakdown (mechanical) of intraperitoneal dialysis catheter, initial encounter: Secondary | ICD-10-CM | POA: Diagnosis not present

## 2021-10-04 DIAGNOSIS — N186 End stage renal disease: Secondary | ICD-10-CM | POA: Diagnosis not present

## 2021-10-04 DIAGNOSIS — N185 Chronic kidney disease, stage 5: Secondary | ICD-10-CM | POA: Diagnosis not present

## 2021-10-04 DIAGNOSIS — Z992 Dependence on renal dialysis: Secondary | ICD-10-CM | POA: Diagnosis not present

## 2021-10-04 DIAGNOSIS — G40802 Other epilepsy, not intractable, without status epilepticus: Secondary | ICD-10-CM | POA: Diagnosis not present

## 2021-10-05 DIAGNOSIS — N186 End stage renal disease: Secondary | ICD-10-CM | POA: Diagnosis not present

## 2021-10-05 DIAGNOSIS — Z992 Dependence on renal dialysis: Secondary | ICD-10-CM | POA: Diagnosis not present

## 2021-10-06 DIAGNOSIS — N186 End stage renal disease: Secondary | ICD-10-CM | POA: Diagnosis not present

## 2021-10-06 DIAGNOSIS — Z992 Dependence on renal dialysis: Secondary | ICD-10-CM | POA: Diagnosis not present

## 2021-10-07 DIAGNOSIS — N055 Unspecified nephritic syndrome with diffuse mesangiocapillary glomerulonephritis: Secondary | ICD-10-CM | POA: Diagnosis not present

## 2021-10-07 DIAGNOSIS — D649 Anemia, unspecified: Secondary | ICD-10-CM | POA: Diagnosis present

## 2021-10-07 DIAGNOSIS — Z01810 Encounter for preprocedural cardiovascular examination: Secondary | ICD-10-CM | POA: Diagnosis not present

## 2021-10-07 DIAGNOSIS — D638 Anemia in other chronic diseases classified elsewhere: Secondary | ICD-10-CM | POA: Diagnosis present

## 2021-10-07 DIAGNOSIS — N186 End stage renal disease: Secondary | ICD-10-CM | POA: Diagnosis not present

## 2021-10-07 DIAGNOSIS — G40909 Epilepsy, unspecified, not intractable, without status epilepticus: Secondary | ICD-10-CM | POA: Diagnosis not present

## 2021-10-08 DIAGNOSIS — N186 End stage renal disease: Secondary | ICD-10-CM | POA: Diagnosis not present

## 2021-10-08 DIAGNOSIS — I120 Hypertensive chronic kidney disease with stage 5 chronic kidney disease or end stage renal disease: Secondary | ICD-10-CM | POA: Diagnosis not present

## 2021-10-08 DIAGNOSIS — T8571XA Infection and inflammatory reaction due to peritoneal dialysis catheter, initial encounter: Secondary | ICD-10-CM | POA: Diagnosis not present

## 2021-10-08 DIAGNOSIS — R0602 Shortness of breath: Secondary | ICD-10-CM | POA: Diagnosis not present

## 2021-10-08 DIAGNOSIS — Z992 Dependence on renal dialysis: Secondary | ICD-10-CM | POA: Diagnosis not present

## 2021-10-08 DIAGNOSIS — I517 Cardiomegaly: Secondary | ICD-10-CM | POA: Diagnosis not present

## 2021-10-08 DIAGNOSIS — D649 Anemia, unspecified: Secondary | ICD-10-CM | POA: Diagnosis not present

## 2021-10-08 DIAGNOSIS — D631 Anemia in chronic kidney disease: Secondary | ICD-10-CM | POA: Diagnosis not present

## 2021-10-08 DIAGNOSIS — L089 Local infection of the skin and subcutaneous tissue, unspecified: Secondary | ICD-10-CM | POA: Diagnosis not present

## 2021-10-08 DIAGNOSIS — E039 Hypothyroidism, unspecified: Secondary | ICD-10-CM | POA: Diagnosis not present

## 2021-10-09 DIAGNOSIS — I120 Hypertensive chronic kidney disease with stage 5 chronic kidney disease or end stage renal disease: Secondary | ICD-10-CM | POA: Diagnosis not present

## 2021-10-09 DIAGNOSIS — N186 End stage renal disease: Secondary | ICD-10-CM | POA: Diagnosis not present

## 2021-10-09 DIAGNOSIS — E039 Hypothyroidism, unspecified: Secondary | ICD-10-CM | POA: Diagnosis not present

## 2021-10-09 DIAGNOSIS — D631 Anemia in chronic kidney disease: Secondary | ICD-10-CM | POA: Diagnosis not present

## 2021-10-09 DIAGNOSIS — Z992 Dependence on renal dialysis: Secondary | ICD-10-CM | POA: Diagnosis not present

## 2021-10-09 DIAGNOSIS — D649 Anemia, unspecified: Secondary | ICD-10-CM | POA: Diagnosis not present

## 2021-10-09 DIAGNOSIS — Z4902 Encounter for fitting and adjustment of peritoneal dialysis catheter: Secondary | ICD-10-CM | POA: Diagnosis not present

## 2021-10-10 DIAGNOSIS — D631 Anemia in chronic kidney disease: Secondary | ICD-10-CM | POA: Diagnosis not present

## 2021-10-10 DIAGNOSIS — E039 Hypothyroidism, unspecified: Secondary | ICD-10-CM | POA: Diagnosis not present

## 2021-10-10 DIAGNOSIS — Z4902 Encounter for fitting and adjustment of peritoneal dialysis catheter: Secondary | ICD-10-CM | POA: Diagnosis not present

## 2021-10-10 DIAGNOSIS — N186 End stage renal disease: Secondary | ICD-10-CM | POA: Diagnosis not present

## 2021-10-10 DIAGNOSIS — D649 Anemia, unspecified: Secondary | ICD-10-CM | POA: Diagnosis not present

## 2021-10-10 DIAGNOSIS — I120 Hypertensive chronic kidney disease with stage 5 chronic kidney disease or end stage renal disease: Secondary | ICD-10-CM | POA: Diagnosis not present

## 2021-10-11 DIAGNOSIS — Z992 Dependence on renal dialysis: Secondary | ICD-10-CM | POA: Diagnosis not present

## 2021-10-11 DIAGNOSIS — N186 End stage renal disease: Secondary | ICD-10-CM | POA: Diagnosis not present

## 2021-10-12 DIAGNOSIS — Z992 Dependence on renal dialysis: Secondary | ICD-10-CM | POA: Diagnosis not present

## 2021-10-12 DIAGNOSIS — N186 End stage renal disease: Secondary | ICD-10-CM | POA: Diagnosis not present

## 2021-10-13 DIAGNOSIS — Z992 Dependence on renal dialysis: Secondary | ICD-10-CM | POA: Diagnosis not present

## 2021-10-13 DIAGNOSIS — N186 End stage renal disease: Secondary | ICD-10-CM | POA: Diagnosis not present

## 2021-10-14 DIAGNOSIS — N186 End stage renal disease: Secondary | ICD-10-CM | POA: Diagnosis not present

## 2021-10-14 DIAGNOSIS — Z992 Dependence on renal dialysis: Secondary | ICD-10-CM | POA: Diagnosis not present

## 2021-10-15 DIAGNOSIS — Z992 Dependence on renal dialysis: Secondary | ICD-10-CM | POA: Diagnosis not present

## 2021-10-15 DIAGNOSIS — N186 End stage renal disease: Secondary | ICD-10-CM | POA: Diagnosis not present

## 2021-10-16 DIAGNOSIS — Z992 Dependence on renal dialysis: Secondary | ICD-10-CM | POA: Diagnosis not present

## 2021-10-16 DIAGNOSIS — N186 End stage renal disease: Secondary | ICD-10-CM | POA: Diagnosis not present

## 2021-10-17 DIAGNOSIS — N186 End stage renal disease: Secondary | ICD-10-CM | POA: Diagnosis not present

## 2021-10-17 DIAGNOSIS — Z992 Dependence on renal dialysis: Secondary | ICD-10-CM | POA: Diagnosis not present

## 2021-10-18 DIAGNOSIS — T8571XA Infection and inflammatory reaction due to peritoneal dialysis catheter, initial encounter: Secondary | ICD-10-CM | POA: Diagnosis not present

## 2021-10-18 DIAGNOSIS — N059 Unspecified nephritic syndrome with unspecified morphologic changes: Secondary | ICD-10-CM | POA: Diagnosis not present

## 2021-10-18 DIAGNOSIS — T85611S Breakdown (mechanical) of intraperitoneal dialysis catheter, sequela: Secondary | ICD-10-CM | POA: Diagnosis not present

## 2021-10-18 DIAGNOSIS — T85611A Breakdown (mechanical) of intraperitoneal dialysis catheter, initial encounter: Secondary | ICD-10-CM | POA: Diagnosis not present

## 2021-10-18 DIAGNOSIS — Z992 Dependence on renal dialysis: Secondary | ICD-10-CM | POA: Diagnosis not present

## 2021-10-18 DIAGNOSIS — N186 End stage renal disease: Secondary | ICD-10-CM | POA: Diagnosis not present

## 2021-10-19 DIAGNOSIS — N186 End stage renal disease: Secondary | ICD-10-CM | POA: Diagnosis not present

## 2021-10-19 DIAGNOSIS — Z992 Dependence on renal dialysis: Secondary | ICD-10-CM | POA: Diagnosis not present

## 2021-10-20 DIAGNOSIS — Z992 Dependence on renal dialysis: Secondary | ICD-10-CM | POA: Diagnosis not present

## 2021-10-20 DIAGNOSIS — N186 End stage renal disease: Secondary | ICD-10-CM | POA: Diagnosis not present

## 2021-10-21 DIAGNOSIS — Z992 Dependence on renal dialysis: Secondary | ICD-10-CM | POA: Diagnosis not present

## 2021-10-21 DIAGNOSIS — N186 End stage renal disease: Secondary | ICD-10-CM | POA: Diagnosis not present

## 2021-10-21 DIAGNOSIS — T85611A Breakdown (mechanical) of intraperitoneal dialysis catheter, initial encounter: Secondary | ICD-10-CM | POA: Insufficient documentation

## 2021-10-22 DIAGNOSIS — Z992 Dependence on renal dialysis: Secondary | ICD-10-CM | POA: Diagnosis not present

## 2021-10-22 DIAGNOSIS — N186 End stage renal disease: Secondary | ICD-10-CM | POA: Diagnosis not present

## 2021-10-23 ENCOUNTER — Ambulatory Visit
Admission: RE | Admit: 2021-10-23 | Discharge: 2021-10-23 | Disposition: A | Discharge status: Home / Self Care | Payer: BC Managed Care – PPO | Source: Ambulatory Visit | Attending: Nephrology | Admitting: Nephrology

## 2021-10-23 VITALS — BP 166/110 | HR 83 | Temp 98.2°F | Resp 18

## 2021-10-23 DIAGNOSIS — N186 End stage renal disease: Secondary | ICD-10-CM | POA: Diagnosis not present

## 2021-10-23 DIAGNOSIS — D649 Anemia, unspecified: Secondary | ICD-10-CM | POA: Insufficient documentation

## 2021-10-23 DIAGNOSIS — Z992 Dependence on renal dialysis: Secondary | ICD-10-CM | POA: Diagnosis not present

## 2021-10-23 LAB — PREPARE RBC (CROSSMATCH)

## 2021-10-23 LAB — ABO/RH: ABO/RH(D): O POS

## 2021-10-23 LAB — HEMOGLOBIN AND HEMATOCRIT, BLOOD
HCT: 17.7 % — ABNORMAL LOW (ref 36.0–46.0)
HCT: 19.7 % — ABNORMAL LOW (ref 36.0–46.0)
Hemoglobin: 5.8 g/dL — ABNORMAL LOW (ref 12.0–15.0)
Hemoglobin: 6.6 g/dL — ABNORMAL LOW (ref 12.0–15.0)

## 2021-10-23 MED ORDER — SODIUM CHLORIDE FLUSH 0.9 % IV SOLN
INTRAVENOUS | Status: AC
  Filled 2021-10-23: qty 10

## 2021-10-24 DIAGNOSIS — Z992 Dependence on renal dialysis: Secondary | ICD-10-CM | POA: Diagnosis not present

## 2021-10-24 DIAGNOSIS — N186 End stage renal disease: Secondary | ICD-10-CM | POA: Diagnosis not present

## 2021-10-24 LAB — TYPE AND SCREEN
ABO/RH(D): O POS
Antibody Screen: POSITIVE
Donor AG Type: NEGATIVE
Donor AG Type: NEGATIVE
Unit division: 0
Unit division: 0

## 2021-10-24 LAB — BPAM RBC
Blood Product Expiration Date: 202303272359
Blood Product Expiration Date: 202304042359
ISSUE DATE / TIME: 202303011139
ISSUE DATE / TIME: 202303011420
Unit Type and Rh: 5100
Unit Type and Rh: 9500

## 2021-10-25 DIAGNOSIS — N186 End stage renal disease: Secondary | ICD-10-CM | POA: Diagnosis not present

## 2021-10-25 DIAGNOSIS — Z992 Dependence on renal dialysis: Secondary | ICD-10-CM | POA: Diagnosis not present

## 2021-10-26 DIAGNOSIS — N186 End stage renal disease: Secondary | ICD-10-CM | POA: Diagnosis not present

## 2021-10-26 DIAGNOSIS — Z992 Dependence on renal dialysis: Secondary | ICD-10-CM | POA: Diagnosis not present

## 2021-10-27 DIAGNOSIS — N186 End stage renal disease: Secondary | ICD-10-CM | POA: Diagnosis not present

## 2021-10-27 DIAGNOSIS — Z992 Dependence on renal dialysis: Secondary | ICD-10-CM | POA: Diagnosis not present

## 2021-10-28 DIAGNOSIS — Z992 Dependence on renal dialysis: Secondary | ICD-10-CM | POA: Diagnosis not present

## 2021-10-28 DIAGNOSIS — N186 End stage renal disease: Secondary | ICD-10-CM | POA: Diagnosis not present

## 2021-10-30 DIAGNOSIS — Z992 Dependence on renal dialysis: Secondary | ICD-10-CM | POA: Diagnosis not present

## 2021-10-30 DIAGNOSIS — N186 End stage renal disease: Secondary | ICD-10-CM | POA: Diagnosis not present

## 2021-10-30 DIAGNOSIS — E8779 Other fluid overload: Secondary | ICD-10-CM | POA: Diagnosis not present

## 2021-10-31 DIAGNOSIS — F17210 Nicotine dependence, cigarettes, uncomplicated: Secondary | ICD-10-CM | POA: Diagnosis not present

## 2021-10-31 DIAGNOSIS — I120 Hypertensive chronic kidney disease with stage 5 chronic kidney disease or end stage renal disease: Secondary | ICD-10-CM | POA: Diagnosis not present

## 2021-10-31 DIAGNOSIS — Z79899 Other long term (current) drug therapy: Secondary | ICD-10-CM | POA: Diagnosis not present

## 2021-10-31 DIAGNOSIS — T85611A Breakdown (mechanical) of intraperitoneal dialysis catheter, initial encounter: Secondary | ICD-10-CM | POA: Diagnosis not present

## 2021-10-31 DIAGNOSIS — Y841 Kidney dialysis as the cause of abnormal reaction of the patient, or of later complication, without mention of misadventure at the time of the procedure: Secondary | ICD-10-CM | POA: Diagnosis not present

## 2021-10-31 DIAGNOSIS — N186 End stage renal disease: Secondary | ICD-10-CM | POA: Diagnosis not present

## 2021-10-31 DIAGNOSIS — Z992 Dependence on renal dialysis: Secondary | ICD-10-CM | POA: Diagnosis not present

## 2021-11-01 DIAGNOSIS — N186 End stage renal disease: Secondary | ICD-10-CM | POA: Diagnosis not present

## 2021-11-01 DIAGNOSIS — Z992 Dependence on renal dialysis: Secondary | ICD-10-CM | POA: Diagnosis not present

## 2021-11-01 DIAGNOSIS — E8779 Other fluid overload: Secondary | ICD-10-CM | POA: Diagnosis not present

## 2021-11-04 DIAGNOSIS — E8779 Other fluid overload: Secondary | ICD-10-CM | POA: Diagnosis not present

## 2021-11-04 DIAGNOSIS — Z992 Dependence on renal dialysis: Secondary | ICD-10-CM | POA: Diagnosis not present

## 2021-11-04 DIAGNOSIS — N186 End stage renal disease: Secondary | ICD-10-CM | POA: Diagnosis not present

## 2021-11-05 DIAGNOSIS — N186 End stage renal disease: Secondary | ICD-10-CM | POA: Diagnosis not present

## 2021-11-05 DIAGNOSIS — E8779 Other fluid overload: Secondary | ICD-10-CM | POA: Diagnosis not present

## 2021-11-05 DIAGNOSIS — Z992 Dependence on renal dialysis: Secondary | ICD-10-CM | POA: Diagnosis not present

## 2021-11-07 DIAGNOSIS — Z992 Dependence on renal dialysis: Secondary | ICD-10-CM | POA: Diagnosis not present

## 2021-11-07 DIAGNOSIS — N186 End stage renal disease: Secondary | ICD-10-CM | POA: Diagnosis not present

## 2021-11-07 DIAGNOSIS — E8779 Other fluid overload: Secondary | ICD-10-CM | POA: Diagnosis not present

## 2021-11-08 ENCOUNTER — Emergency Department (HOSPITAL_COMMUNITY): Payer: BC Managed Care – PPO

## 2021-11-08 ENCOUNTER — Observation Stay (HOSPITAL_COMMUNITY)
Admission: EM | Admit: 2021-11-08 | Discharge: 2021-11-09 | Disposition: A | Discharge status: Home / Self Care | Payer: BC Managed Care – PPO | Attending: Emergency Medicine | Admitting: Emergency Medicine

## 2021-11-08 ENCOUNTER — Other Ambulatory Visit: Payer: Self-pay

## 2021-11-08 ENCOUNTER — Encounter (HOSPITAL_COMMUNITY): Payer: Self-pay

## 2021-11-08 DIAGNOSIS — N186 End stage renal disease: Secondary | ICD-10-CM

## 2021-11-08 DIAGNOSIS — Z859 Personal history of malignant neoplasm, unspecified: Secondary | ICD-10-CM | POA: Diagnosis not present

## 2021-11-08 DIAGNOSIS — I10 Essential (primary) hypertension: Secondary | ICD-10-CM

## 2021-11-08 DIAGNOSIS — R188 Other ascites: Secondary | ICD-10-CM | POA: Diagnosis not present

## 2021-11-08 DIAGNOSIS — R079 Chest pain, unspecified: Secondary | ICD-10-CM | POA: Diagnosis not present

## 2021-11-08 DIAGNOSIS — R0602 Shortness of breath: Secondary | ICD-10-CM | POA: Diagnosis not present

## 2021-11-08 DIAGNOSIS — N2581 Secondary hyperparathyroidism of renal origin: Secondary | ICD-10-CM | POA: Diagnosis not present

## 2021-11-08 DIAGNOSIS — F17210 Nicotine dependence, cigarettes, uncomplicated: Secondary | ICD-10-CM | POA: Diagnosis not present

## 2021-11-08 DIAGNOSIS — Z79899 Other long term (current) drug therapy: Secondary | ICD-10-CM | POA: Diagnosis not present

## 2021-11-08 DIAGNOSIS — Z992 Dependence on renal dialysis: Secondary | ICD-10-CM | POA: Diagnosis not present

## 2021-11-08 DIAGNOSIS — R0789 Other chest pain: Secondary | ICD-10-CM | POA: Diagnosis not present

## 2021-11-08 DIAGNOSIS — E039 Hypothyroidism, unspecified: Secondary | ICD-10-CM

## 2021-11-08 DIAGNOSIS — D649 Anemia, unspecified: Secondary | ICD-10-CM | POA: Diagnosis not present

## 2021-11-08 DIAGNOSIS — J45909 Unspecified asthma, uncomplicated: Secondary | ICD-10-CM | POA: Diagnosis not present

## 2021-11-08 DIAGNOSIS — D638 Anemia in other chronic diseases classified elsewhere: Secondary | ICD-10-CM | POA: Diagnosis present

## 2021-11-08 DIAGNOSIS — R0689 Other abnormalities of breathing: Secondary | ICD-10-CM | POA: Diagnosis not present

## 2021-11-08 DIAGNOSIS — J810 Acute pulmonary edema: Secondary | ICD-10-CM | POA: Diagnosis not present

## 2021-11-08 DIAGNOSIS — I120 Hypertensive chronic kidney disease with stage 5 chronic kidney disease or end stage renal disease: Secondary | ICD-10-CM | POA: Insufficient documentation

## 2021-11-08 DIAGNOSIS — R0600 Dyspnea, unspecified: Secondary | ICD-10-CM | POA: Diagnosis not present

## 2021-11-08 LAB — CBC WITH DIFFERENTIAL/PLATELET
Abs Immature Granulocytes: 0.03 10*3/uL (ref 0.00–0.07)
Basophils Absolute: 0 10*3/uL (ref 0.0–0.1)
Basophils Relative: 1 %
Eosinophils Absolute: 0.5 10*3/uL (ref 0.0–0.5)
Eosinophils Relative: 6 %
HCT: 19.4 % — ABNORMAL LOW (ref 36.0–46.0)
Hemoglobin: 6.5 g/dL — CL (ref 12.0–15.0)
Immature Granulocytes: 0 %
Lymphocytes Relative: 18 %
Lymphs Abs: 1.5 10*3/uL (ref 0.7–4.0)
MCH: 31 pg (ref 26.0–34.0)
MCHC: 33.5 g/dL (ref 30.0–36.0)
MCV: 92.4 fL (ref 80.0–100.0)
Monocytes Absolute: 0.6 10*3/uL (ref 0.1–1.0)
Monocytes Relative: 7 %
Neutro Abs: 5.4 10*3/uL (ref 1.7–7.7)
Neutrophils Relative %: 68 %
Platelets: 219 10*3/uL (ref 150–400)
RBC: 2.1 MIL/uL — ABNORMAL LOW (ref 3.87–5.11)
RDW: 13.2 % (ref 11.5–15.5)
WBC: 8 10*3/uL (ref 4.0–10.5)
nRBC: 0 % (ref 0.0–0.2)

## 2021-11-08 LAB — BASIC METABOLIC PANEL
Anion gap: 11 (ref 5–15)
BUN: 31 mg/dL — ABNORMAL HIGH (ref 6–20)
CO2: 26 mmol/L (ref 22–32)
Calcium: 9.2 mg/dL (ref 8.9–10.3)
Chloride: 99 mmol/L (ref 98–111)
Creatinine, Ser: 7.13 mg/dL — ABNORMAL HIGH (ref 0.44–1.00)
GFR, Estimated: 7 mL/min — ABNORMAL LOW (ref >60)
Glucose, Bld: 90 mg/dL (ref 70–99)
Potassium: 4.9 mmol/L (ref 3.5–5.1)
Sodium: 136 mmol/L (ref 135–145)

## 2021-11-08 LAB — BRAIN NATRIURETIC PEPTIDE: B Natriuretic Peptide: 314.3 pg/mL — ABNORMAL HIGH (ref 0.0–100.0)

## 2021-11-08 LAB — PREPARE RBC (CROSSMATCH)

## 2021-11-08 LAB — D-DIMER, QUANTITATIVE: D-Dimer, Quant: 1.52 ug/mL-FEU — ABNORMAL HIGH (ref 0.00–0.50)

## 2021-11-08 LAB — TROPONIN I (HIGH SENSITIVITY)
Troponin I (High Sensitivity): 6 ng/L (ref <18)
Troponin I (High Sensitivity): 5 ng/L (ref <18)

## 2021-11-08 LAB — HCG, SERUM, QUALITATIVE: Preg, Serum: NEGATIVE

## 2021-11-08 LAB — HEPATITIS B SURFACE ANTIGEN: Hepatitis B Surface Ag: NONREACTIVE

## 2021-11-08 LAB — HEPATITIS B SURFACE ANTIBODY,QUALITATIVE: Hep B S Ab: REACTIVE — AB

## 2021-11-08 MED ORDER — SODIUM CHLORIDE 0.9 % IV SOLN: 100.0000 mL | INTRAVENOUS | Status: DC | PRN

## 2021-11-08 MED ORDER — SODIUM CHLORIDE 0.9 % IV SOLN
10.0000 mL/h | Freq: Once | INTRAVENOUS | Status: AC
  Administered 2021-11-08: 10 mL/h via INTRAVENOUS

## 2021-11-08 MED ORDER — AMLODIPINE BESYLATE 5 MG PO TABS
5.0000 mg | ORAL_TABLET | Freq: Once | ORAL | Status: AC
  Administered 2021-11-08: 5 mg via ORAL
  Filled 2021-11-08: qty 1

## 2021-11-08 MED ORDER — SODIUM CHLORIDE 0.9 % IV SOLN: 10.0000 mL/h | Freq: Once | INTRAVENOUS | Status: DC

## 2021-11-08 MED ORDER — SODIUM CHLORIDE 0.9% IV SOLUTION: Freq: Once | INTRAVENOUS | Status: DC

## 2021-11-08 MED ORDER — PNEUMOCOCCAL 20-VAL CONJ VACC 0.5 ML IM SUSY
0.5000 mL | PREFILLED_SYRINGE | INTRAMUSCULAR | Status: DC
Start: 2021-11-09 — End: 2021-11-09
  Filled 2021-11-08: qty 0.5

## 2021-11-08 MED ORDER — CARVEDILOL 12.5 MG PO TABS
12.5000 mg | ORAL_TABLET | Freq: Once | ORAL | Status: AC
  Administered 2021-11-08: 12.5 mg via ORAL
  Filled 2021-11-08: qty 1

## 2021-11-08 MED ORDER — IOHEXOL 350 MG/ML SOLN
80.0000 mL | Freq: Once | INTRAVENOUS | Status: AC | PRN
  Administered 2021-11-08: 80 mL via INTRAVENOUS

## 2021-11-08 MED ORDER — HEPARIN SODIUM (PORCINE) 1000 UNIT/ML DIALYSIS
1000.0000 [IU] | INTRAMUSCULAR | Status: DC | PRN
  Filled 2021-11-08: qty 1

## 2021-11-08 MED ORDER — LIDOCAINE 5 % EX PTCH
1.0000 | MEDICATED_PATCH | CUTANEOUS | Status: DC
  Administered 2021-11-08 – 2021-11-09 (×2): 1 via TRANSDERMAL
  Filled 2021-11-08 (×2): qty 1

## 2021-11-08 MED ORDER — KETOROLAC TROMETHAMINE 15 MG/ML IJ SOLN
15.0000 mg | Freq: Once | INTRAMUSCULAR | Status: AC
  Administered 2021-11-08: 15 mg via INTRAVENOUS
  Filled 2021-11-08: qty 1

## 2021-11-08 MED ORDER — ALTEPLASE 2 MG IJ SOLR: 2.0000 mg | Freq: Once | INTRAMUSCULAR | Status: DC | PRN

## 2021-11-08 MED ORDER — IPRATROPIUM-ALBUTEROL 0.5-2.5 (3) MG/3ML IN SOLN
3.0000 mL | Freq: Once | RESPIRATORY_TRACT | Status: AC
  Administered 2021-11-08: 3 mL via RESPIRATORY_TRACT
  Filled 2021-11-08: qty 3

## 2021-11-08 MED ORDER — NEPRO/CARBSTEADY PO LIQD
237.0000 mL | Freq: Two times a day (BID) | ORAL | Status: DC
Start: 2021-11-09 — End: 2021-11-09
  Administered 2021-11-09: 237 mL via ORAL

## 2021-11-08 MED ORDER — CHLORHEXIDINE GLUCONATE CLOTH 2 % EX PADS
6.0000 | MEDICATED_PAD | Freq: Every day | CUTANEOUS | Status: DC
  Administered 2021-11-09: 6 via TOPICAL

## 2021-11-08 MED ORDER — LIDOCAINE-PRILOCAINE 2.5-2.5 % EX CREA: 1.0000 "application " | TOPICAL_CREAM | CUTANEOUS | Status: DC | PRN

## 2021-11-08 MED ORDER — LIDOCAINE HCL (PF) 1 % IJ SOLN: 5.0000 mL | INTRAMUSCULAR | Status: DC | PRN

## 2021-11-08 MED ORDER — PENTAFLUOROPROP-TETRAFLUOROETH EX AERO: 1.0000 "application " | INHALATION_SPRAY | CUTANEOUS | Status: DC | PRN

## 2021-11-08 NOTE — ED Triage Notes (Signed)
Pt BIB [facility] from home c/o central CP radiating to L arm and SOB. Per EMS pt has bilateral diminished breath sounds. Pt given 10 of Albuterol, 0.5 of Atrovent, 324 Aspirin, and 0.4 Nitro.  ?HX- Dialysis [facility]  ? ?

## 2021-11-08 NOTE — ED Notes (Signed)
Pt completed initial unit of blood but complains of increased shortness of breath and new "white frothy sputum." RN updated Dr Pearline Cables of this finding. ?

## 2021-11-08 NOTE — Consult Note (Signed)
[facility] ?Renal Consultation Note  ?  ?Indication for Consultation:  Management of ESRD/hemodialysis; anemia, hypertension/volume and secondary hyperparathyroidism ? ?PCP:Pcp, No ? ?HPI: Holly [name] is a 36 y.o. female with ESRD on HD TTS at [facility].  Her past medical history is significant for glomerulonephritis (11/05/2018), HTN, HLD, thyroid disease, epilepsy, and childhood asthma. ? ?Patient presents to the ED c/o SOB and CP. Reviewed records in [place]. Patient reports originally being on PD for 4-5 years. She developed a staph infection within her PD catheter and previously underwent a PD catheter revision by Dr. Raul Del. Shortly after recent revision, the PD catheter malfunctioned again with notable fibrin clots and clogging. Patient also developing ABD swelling. Patient underwent PD catheter removal on 10/31/21 and transitioned to IHD for 2-3 weeks ago.  ? ?Seen and examined patient at bedside. She reports chest tightness but says her breathing has improved. Currently on 3L O2 [place]. She received her full HD yesterday and reports compliance with her treatments. CTA chest (-) PE but shows pulmonary edema. Hgb noted of 6.5. She already received 1 unit PRBC and scheduled to receive another. Other labs include: K+ 4.9, BUN 31, SrCr 7.13, Ca 9.2, BNP 314. Plan for HD tonight. ? ?Past Medical History:  ?Diagnosis Date  ? Cancer [facility])   ? Dialysis patient [facility])   ? Hypertension   ? Renal disorder   ? Thyroid disease   ? ?Past Surgical History:  ?Procedure Laterality Date  ? ABDOMINAL SURGERY    ? RENAL BIOPSY    ? x 2  ? TUMOR REMOVAL    ? right face  ? ?Family History  ?Problem Relation Age of Onset  ? Multiple sclerosis Mother   ? Cancer Father   ? COPD Father   ? ?Social History: ? reports that she has been smoking cigarettes. She has never used smokeless tobacco. She reports that she does not drink alcohol and does not use drugs. ?Allergies  ?Allergen  Reactions  ? Rituximab Anaphylaxis  ? Bupropion Other (See Comments)  ?  Caused seizures  ? Ciprofloxacin Other (See Comments)  ?  Caused Seizures   ? Levetiracetam Other (See Comments)  ?  Medical Record H&P (unknown reaction)  ? Oxycodone-Acetaminophen Nausea Only  ? Tramadol Other (See Comments)  ?  Caused seizures  ? ?Prior to Admission medications   ?Medication Sig Start Date End Date Taking? Authorizing Provider  ?acetaminophen (TYLENOL) 500 MG tablet Take 1,000 mg by mouth every 6 (six) hours as needed for headache (pain).   Yes [provider]  ?acyclovir (ZOVIRAX) 200 MG capsule Take 200 mg by mouth 2 (two) times daily as needed (breakouts). 08/14/21  Yes [provider]  ?amLODipine (NORVASC) 5 MG tablet Take 5 mg by mouth at bedtime. 11/05/21  Yes [provider]  ?carvedilol (COREG) 12.5 MG tablet Take 12.5 mg by mouth 2 (two) times daily with a meal.   Yes [provider]  ?cinacalcet (SENSIPAR) 60 MG tablet Take 60 mg by mouth at bedtime.   Yes [provider]  ?diclofenac Sodium (VOLTAREN) 1 % GEL Apply 1 application. topically 4 (four) times daily as needed (pain).   Yes [provider]  ?lanthanum (FOSRENOL) 1000 MG chewable tablet Chew 1,000 mg by mouth 3 (three) times daily with meals. 08/15/21  Yes [provider]  ?levothyroxine (SYNTHROID) 200 MCG tablet Take 200 mcg by mouth daily before breakfast.   Yes [provider]  ?  losartan (COZAAR) 100 MG tablet Take 100 mg by mouth at bedtime.   Yes [provider]  ? ?Current Facility-Administered Medications  ?Medication Dose Route Frequency Provider Last Rate Last Admin  ? 0.9 %  sodium chloride infusion  10 mL/hr Intravenous Once Wynona Dove A, DO      ? 0.9 %  sodium chloride infusion  100 mL Intravenous PRN Adelfa Koh, NP      ? 0.9 %  sodium chloride infusion  100 mL Intravenous PRN Adelfa Koh, NP      ? alteplase (CATHFLO ACTIVASE) injection 2  mg  2 mg Intracatheter Once PRN Adelfa Koh, NP      ? Derrill Memo ON 11/09/2021] Chlorhexidine Gluconate Cloth 2 % PADS 6 each  6 each Topical Q0600 Adelfa Koh, NP      ? heparin injection 1,000 Units  1,000 Units Dialysis PRN Adelfa Koh, NP      ? lidocaine (LIDODERM) 5 % 1 patch  1 patch Transdermal Q24H Wynona Dove A, DO   1 patch at 11/08/21 0849  ? lidocaine (PF) (XYLOCAINE) 1 % injection 5 mL  5 mL Intradermal PRN Adelfa Koh, NP      ? lidocaine-prilocaine (EMLA) cream 1 application.  1 application. Topical PRN Adelfa Koh, NP      ? pentafluoroprop-tetrafluoroeth [place] Dyke) aerosol 1 application.  1 application. Topical PRN Adelfa Koh, NP      ? ?Current Outpatient Medications  ?Medication Sig Dispense Refill  ? acetaminophen (TYLENOL) 500 MG tablet Take 1,000 mg by mouth every 6 (six) hours as needed for headache (pain).    ? acyclovir (ZOVIRAX) 200 MG capsule Take 200 mg by mouth 2 (two) times daily as needed (breakouts).    ? amLODipine (NORVASC) 5 MG tablet Take 5 mg by mouth at bedtime.    ? carvedilol (COREG) 12.5 MG tablet Take 12.5 mg by mouth 2 (two) times daily with a meal.    ? cinacalcet (SENSIPAR) 60 MG tablet Take 60 mg by mouth at bedtime.    ? diclofenac Sodium (VOLTAREN) 1 % GEL Apply 1 application. topically 4 (four) times daily as needed (pain).    ? lanthanum (FOSRENOL) 1000 MG chewable tablet Chew 1,000 mg by mouth 3 (three) times daily with meals.    ? levothyroxine (SYNTHROID) 200 MCG tablet Take 200 mcg by mouth daily before breakfast.    ? losartan (COZAAR) 100 MG tablet Take 100 mg by mouth at bedtime.    ? ?Labs: ?Basic Metabolic Panel: ?Recent Labs  ?Lab 11/08/21 ?0636  ?NA 136  ?K 4.9  ?CL 99  ?CO2 26  ?GLUCOSE 90  ?BUN 31*  ?CREATININE 7.13*  ?CALCIUM 9.2  ? ?Liver Function Tests: ?No results for input(s): AST, ALT, ALKPHOS, BILITOT, PROT, ALBUMIN in the last 168 hours. ?No results for input(s): LIPASE, AMYLASE in the last 168  hours. ?No results for input(s): AMMONIA in the last 168 hours. ?CBC: ?Recent Labs  ?Lab 11/08/21 ?0636  ?WBC 8.0  ?NEUTROABS 5.4  ?HGB 6.5*  ?HCT 19.4*  ?MCV 92.4  ?PLT 219  ? ?Cardiac Enzymes: ?No results for input(s): CKTOTAL, CKMB, CKMBINDEX, TROPONINI in the last 168 hours. ?CBG: ?No results for input(s): GLUCAP in the last 168 hours. ?Iron Studies: No results for input(s): IRON, TIBC, TRANSFERRIN, FERRITIN in the last 72 hours. ?Studies/Results: ?CT Angio Chest PE W and/or Wo Contrast ? ?Result Date: 11/08/2021 ?CLINICAL DATA:  Chest pain and shortness of  breath. Suspected pulmonary embolism. Positive D-dimer. EXAM: CT ANGIOGRAPHY CHEST WITH CONTRAST TECHNIQUE: Multidetector CT imaging of the chest was performed using the standard protocol during bolus administration of intravenous contrast. Multiplanar CT image reconstructions and MIPs were obtained to evaluate the vascular anatomy. RADIATION DOSE REDUCTION: This exam was performed according to the departmental dose-optimization program which includes automated exposure control, adjustment of the mA and/or kV according to patient size and/or use of iterative reconstruction technique. CONTRAST:  50m OMNIPAQUE IOHEXOL 350 MG/ML SOLN COMPARISON:  Current chest radiograph and prior studies. FINDINGS: Cardiovascular: Pulmonary arteries are well opacified. There is no evidence of a pulmonary embolism. Heart is normal in size. Small pericardial effusion. Normal great vessels. Mediastinum/Nodes: Visualized thyroid is unremarkable. Several shotty right paratracheal mediastinal lymph nodes, largest an azygous level node measuring 1.2 cm in short axis. No mediastinal or hilar masses. Trachea and esophagus are unremarkable. Lungs/Pleura: Bilateral interstitial thickening. This includes bronchial wall thickening, the latter most evident in the lower lobes. There are areas hazy increased attenuation adjacent to the lower attenuation areas of the lungs, a mosaic pattern  that is nonspecific, but suggests small airways disease and or vascular shunting. Trace bilateral pleural effusions. No pneumothorax. Upper Abdomen: Visualized upper kidneys show cortical thinning. Trace

## 2021-11-08 NOTE — ED Notes (Signed)
Pt complains of orthopnea and returning chest pain. Pt tachypneic and sitting in tripod position. O2 sat 96% on room air. 2L per [place] applied for comfort. ?

## 2021-11-08 NOTE — Plan of Care (Signed)

## 2021-11-08 NOTE — H&P (Signed)
?History and Physical  ? ? ?[name] ELF:[id number] DOB: 1986-07-24 DOA: 11/08/2021 ? ?PCP: Pcp, No  ?Patient coming from: Home ? ?I have personally briefly reviewed patient's old medical records available.  ? ?Chief Complaint: Shortness of breath and weakness of 1 day ? ?HPI: Holly [name] is a 36 y.o. female with medical history significant of hypertension, ESRD on hemodialysis, anemia of chronic disease who woke up in the morning with shortness of breath.  Patient receives hemodialysis Tuesday Thursday and Saturday, she had complete dialysis yesterday.  She woke up about 3 AM in the morning could not catch her breath.  She has chronic anemia her baseline hemoglobin has been 8-9 but recently it has been lower than 8, she received 1 unit of blood 2 weeks ago for hemoglobin of less than 6.  She has been on iron supplements with dialysis.  She also has regular menstrual cycle.  Denies any GI blood loss.  History of ulcers hematemesis or hematochezia. ?Denies any headache, nausea or vomiting.  Appetite is fair.  Denies any chest pain.  Does feel chest tightness and shortness of breath.  No cough or congestion.  No fever.  Denies any abdominal pain. ? ?ED Course: Blood pressures are stable.  Initially on 2 to 3 L oxygen.  On and off able to maintain saturations.  Hemoglobin 6.5.  CTA of the chest negative for pulmonary embolism, however shows appropriate pulmonary edema for a dialysis patient.  1 unit PRBC given.  Patient is still feeling some shortness of breath more after blood transfusion so admission was requested.  Patient was seen by nephrology and planning hemodialysis overnight, will also transfuse 1 more unit. ? ?Review of Systems: all systems are reviewed and pertinent positive as per HPI otherwise rest are negative.  ? ? ?Past Medical History:  ?Diagnosis Date  ? Cancer [facility])   ? Dialysis patient [facility])   ? Hypertension   ? Renal disorder   ? Thyroid disease   ? ? ?Past Surgical History:   ?Procedure Laterality Date  ? ABDOMINAL SURGERY    ? RENAL BIOPSY    ? x 2  ? TUMOR REMOVAL    ? right face  ? ? ?Social history  ? reports that she has been smoking cigarettes. She has never used smokeless tobacco. She reports that she does not drink alcohol and does not use drugs. ? ?Allergies  ?Allergen Reactions  ? Rituximab Anaphylaxis  ? Bupropion Other (See Comments)  ?  Caused seizures  ? Ciprofloxacin Other (See Comments)  ?  Caused Seizures   ? Levetiracetam Other (See Comments)  ?  Medical Record H&P (unknown reaction)  ? Oxycodone-Acetaminophen Nausea Only  ? Tramadol Other (See Comments)  ?  Caused seizures  ? ? ?Family History  ?Problem Relation Age of Onset  ? Multiple sclerosis Mother   ? Cancer Father   ? COPD Father   ? ? ? ?Prior to Admission medications   ?Medication Sig Start Date End Date Taking? Authorizing Provider  ?acetaminophen (TYLENOL) 500 MG tablet Take 1,000 mg by mouth every 6 (six) hours as needed for headache (pain).   Yes [provider]  ?acyclovir (ZOVIRAX) 200 MG capsule Take 200 mg by mouth 2 (two) times daily as needed (breakouts). 08/14/21  Yes [provider]  ?amLODipine (NORVASC) 5 MG tablet Take 5 mg by mouth at bedtime. 11/05/21  Yes [provider]  ?carvedilol (COREG) 12.5 MG tablet Take 12.5 mg by mouth 2 (  two) times daily with a meal.   Yes [provider]  ?cinacalcet (SENSIPAR) 60 MG tablet Take 60 mg by mouth at bedtime.   Yes [provider]  ?diclofenac Sodium (VOLTAREN) 1 % GEL Apply 1 application. topically 4 (four) times daily as needed (pain).   Yes [provider]  ?lanthanum (FOSRENOL) 1000 MG chewable tablet Chew 1,000 mg by mouth 3 (three) times daily with meals. 08/15/21  Yes [provider]  ?levothyroxine (SYNTHROID) 200 MCG tablet Take 200 mcg by mouth daily before breakfast.   Yes [provider]  ?losartan (COZAAR) 100 MG tablet Take 100 mg by mouth at bedtime.   Yes [provider]  ? ? ?Physical Exam: ?Vitals:  ? 11/08/21 1400 11/08/21 1545 11/08/21 1615 11/08/21 1720  ?BP: (!) 155/133 (!) 191/106 (!) 150/114   ?Pulse: 82 84 83   ?Resp: '19 19 15   '$ ?Temp:    98 ?F (36.7 ?C)  ?TempSrc:    Oral  ?SpO2: 99% 98% 98%   ?Weight:      ?Height:      ? ? ?Constitutional: NAD, calm, mildly anxious.  Looks fairly comfortable otherwise.  On minimum oxygen.  Looks very pale. ?Vitals:  ? 11/08/21 1400 11/08/21 1545 11/08/21 1615 11/08/21 1720  ?BP: (!) 155/133 (!) 191/106 (!) 150/114   ?Pulse: 82 84 83   ?Resp: '19 19 15   '$ ?Temp:    98 ?F (36.7 ?C)  ?TempSrc:    Oral  ?SpO2: 99% 98% 98%   ?Weight:      ?Height:      ? ?Eyes: PERRL, lids and conjunctivae normal ?ENMT: Mucous membranes are moist. Posterior pharynx clear of any exudate or lesions.Normal dentition.  ?Neck: normal, supple, no masses, no thyromegaly ?Respiratory: clear to auscultation bilaterally, no wheezing, no crackles. Normal respiratory effort. No accessory muscle use.  ?Cardiovascular: Regular rate and rhythm, no murmurs / rubs / gallops. No extremity edema. 2+ pedal pulses. No carotid bruits.  ?Abdomen: no tenderness, no masses palpated. No hepatosplenomegaly. Bowel sounds positive.  ?Musculoskeletal: no clubbing / cyanosis. No joint deformity upper and lower extremities. Good ROM, no contractures. Normal muscle tone.  ?Skin: no rashes, lesions, ulcers. No induration ?Right upper extremity AV fistula present. ?Neurologic: CN 2-12 grossly intact. Sensation intact, DTR normal. Strength 5/5 in all 4.  ?Psychiatric: Normal judgment and insight. Alert and oriented x 3. Normal mood.  ? ? ? ?Labs on Admission: I have personally reviewed following labs and imaging studies ? ?CBC: ?Recent Labs  ?Lab 11/08/21 ?0636  ?WBC 8.0  ?NEUTROABS 5.4  ?HGB 6.5*  ?HCT 19.4*  ?MCV 92.4  ?PLT 219  ? ?Basic Metabolic Panel: ?Recent Labs  ?Lab 11/08/21 ?0636  ?NA 136  ?K 4.9  ?CL 99  ?CO2 26  ?GLUCOSE 90  ?BUN 31*  ?CREATININE 7.13*  ?CALCIUM 9.2   ? ?GFR: ?Estimated Creatinine Clearance: 13.1 mL/min (A) (by C-G formula based on SCr of 7.13 mg/dL (H)). ?Liver Function Tests: ?No results for input(s): AST, ALT, ALKPHOS, BILITOT, PROT, ALBUMIN in the last 168 hours. ?No results for input(s): LIPASE, AMYLASE in the last 168 hours. ?No results for input(s): AMMONIA in the last 168 hours. ?Coagulation Profile: ?No results for input(s): INR, PROTIME in the last 168 hours. ?Cardiac Enzymes: ?No results for input(s): CKTOTAL, CKMB, CKMBINDEX, TROPONINI in the last 168 hours. ?BNP (last 3 results) ?No results for input(s): PROBNP in the last 8760 hours. ?HbA1C: ?No results for input(s): HGBA1C in  the last 72 hours. ?CBG: ?No results for input(s): GLUCAP in the last 168 hours. ?Lipid Profile: ?No results for input(s): CHOL, HDL, LDLCALC, TRIG, CHOLHDL, LDLDIRECT in the last 72 hours. ?Thyroid Function Tests: ?No results for input(s): TSH, T4TOTAL, FREET4, T3FREE, THYROIDAB in the last 72 hours. ?Anemia Panel: ?No results for input(s): VITAMINB12, FOLATE, FERRITIN, TIBC, IRON, RETICCTPCT in the last 72 hours. ?Urine analysis: ?   ?Component Value Date/Time  ? COLORURINE YELLOW 06/22/2021 1607  ? APPEARANCEUR CLOUDY (A) 06/22/2021 1607  ? LABSPEC 1.019 06/22/2021 1607  ? PHURINE 7.0 06/22/2021 1607  ? GLUCOSEU 150 (A) 06/22/2021 1607  ? HGBUR NEGATIVE 06/22/2021 1607  ? BILIRUBINUR NEGATIVE 06/22/2021 1607  ? [place] NEGATIVE 06/22/2021 1607  ? PROTEINUR 100 (A) 06/22/2021 1607  ? NITRITE NEGATIVE 06/22/2021 1607  ? LEUKOCYTESUR LARGE (A) 06/22/2021 1607  ? ? ?Radiological Exams on Admission: ?CT Angio Chest PE W and/or Wo Contrast ? ?Result Date: 11/08/2021 ?CLINICAL DATA:  Chest pain and shortness of breath. Suspected pulmonary embolism. Positive D-dimer. EXAM: CT ANGIOGRAPHY CHEST WITH CONTRAST TECHNIQUE: Multidetector CT imaging of the chest was performed using the standard protocol during bolus administration of intravenous contrast. Multiplanar CT image  reconstructions and MIPs were obtained to evaluate the vascular anatomy. RADIATION DOSE REDUCTION: This exam was performed according to the departmental dose-optimization program which includes automated exposure contro

## 2021-11-08 NOTE — ED Notes (Signed)
Dr Pearline Cables asked this RN to hold off on second unit of blood at this time. RN asked for home BP meds to address HTN, given at this time. ?

## 2021-11-08 NOTE — ED Provider Notes (Signed)
?[facility] ?Provider Note ? ? ?CSN: [id number] ?Arrival date & time: 11/08/21  0631 ? ?  ? ?History ? ?Chief Complaint  ?Patient presents with  ? Chest Pain  ? Shortness of Breath  ? ? ?Holly [name] is a 36 y.o. female. ? ?This is a 35 y.o. female  with significant medical history as below, including HD T, TH, Sa, childhood asthma, hypertension who presents to the ED with complaint of chest pain, dyspnea.  Around 3 AM this morning patient woke up with chest pain and difficulty breathing.  Pain substernal, rating to her shoulders. Unable to lie flat in her bed 2/2 dib.  She used topical diclofenac which did not resolve her symptoms.  EMS was called.  Patient reported profound dyspnea on EMS arrival.  Given breathing treatment. Nitro, asa.  Significant improved her respiratory status following nebulized breathing treatment.  On my assessment patient reports she is feeling significantly better.  She is able to lie flat without dyspnea, very minimal chest pain at this time.  She is breathing comfortably on ambient air. ? ?Patient is report similar symptoms in the past when her hemoglobin has been low.  She is currently menstruating.  Last transfusion was approximately 2 weeks ago ? ?Patient is compliant with her hemodialysis treatments, last treatment yesterday, full treatment.  Compliant with her medication regimen.  She makes minimal urine.  No change to p.o. intake. ? ? ? ?Past Medical History: ?No date: Cancer [facility]) ?No date: Dialysis patient [facility]) ?No date: Hypertension ?No date: Renal disorder ?No date: Thyroid disease ? ?Past Surgical History: ?No date: ABDOMINAL SURGERY ?No date: RENAL BIOPSY ?    Comment:  x 2 ?No date: TUMOR REMOVAL ?    Comment:  right face  ? ? ?The history is provided by the patient. No language interpreter was used.  ?Chest Pain ?Associated symptoms: fatigue and shortness of breath   ?Shortness of Breath ?Associated symptoms: chest pain   ? ?   ? ?Home Medications ?Prior to Admission medications   ?Medication Sig Start Date End Date Taking? Authorizing Provider  ?acetaminophen (TYLENOL) 500 MG tablet Take 1,000 mg by mouth every 6 (six) hours as needed for headache (pain).   Yes [provider]  ?acyclovir (ZOVIRAX) 200 MG capsule Take 200 mg by mouth 2 (two) times daily as needed (breakouts). 08/14/21  Yes [provider]  ?amLODipine (NORVASC) 5 MG tablet Take 5 mg by mouth at bedtime. 11/05/21  Yes [provider]  ?carvedilol (COREG) 12.5 MG tablet Take 12.5 mg by mouth 2 (two) times daily with a meal.   Yes [provider]  ?cinacalcet (SENSIPAR) 60 MG tablet Take 60 mg by mouth at bedtime.   Yes [provider]  ?diclofenac Sodium (VOLTAREN) 1 % GEL Apply 1 application. topically 4 (four) times daily as needed (pain).   Yes [provider]  ?lanthanum (FOSRENOL) 1000 MG chewable tablet Chew 1,000 mg by mouth 3 (three) times daily with meals. 08/15/21  Yes [provider]  ?levothyroxine (SYNTHROID) 200 MCG tablet Take 200 mcg by mouth daily before breakfast.   Yes [provider]  ?losartan (COZAAR) 100 MG tablet Take 100 mg by mouth at bedtime.   Yes [provider]  ?   ? ?Allergies    ?Rituximab, Bupropion, Ciprofloxacin, Levetiracetam, Oxycodone-acetaminophen, and Tramadol   ? ?Review of Systems   ?Review of Systems  ?Constitutional:  Positive for fatigue. Negative for chills.  ?Respiratory:  Positive for shortness of breath.   ?Cardiovascular:  Positive for chest pain. Negative for leg swelling.  ?All other systems reviewed and are negative. ? ?Physical Exam ?Updated Vital Signs ?BP (!) 191/106   Pulse 84   Temp 98 ?F (36.7 ?C)   Resp 19   Ht '5\' 7"'$  (1.702 m)   Wt 93 kg   SpO2 98%   BMI 32.11 kg/m?  ?Physical Exam ?Vitals and nursing note reviewed.  ?Constitutional:   ?   General: She is not in acute distress. ?   Appearance: Normal appearance. She is  well-developed. She is not ill-appearing.  ?HENT:  ?   Head: Normocephalic and atraumatic.  ?   Right Ear: External ear normal.  ?   Left Ear: External ear normal.  ?   Nose: Nose normal.  ?   Mouth/Throat:  ?   Mouth: Mucous membranes are moist.  ?Eyes:  ?   General: No scleral icterus.    ?   Right eye: No discharge.     ?   Left eye: No discharge.  ?Cardiovascular:  ?   Rate and Rhythm: Normal rate and regular rhythm.  ?   Pulses: Normal pulses.  ?   Heart sounds: Normal heart sounds.  ?Pulmonary:  ?   Effort: Pulmonary effort is normal. No respiratory distress.  ?   Breath sounds: Normal breath sounds. No stridor. No wheezing.  ?Chest:  ? ? ?Abdominal:  ?   General: Abdomen is flat. There is no distension.  ?   Tenderness: There is no abdominal tenderness.  ?Musculoskeletal:     ?   General: Normal range of motion.  ?   Cervical back: Normal range of motion.  ?   Right lower leg: No edema.  ?   Left lower leg: No edema.  ?Skin: ?   General: Skin is warm and dry.  ?   Capillary Refill: Capillary refill takes less than 2 seconds.  ?Neurological:  ?   Mental Status: She is alert and oriented to person, place, and time.  ?   GCS: GCS eye subscore is 4. GCS verbal subscore is 5. GCS motor subscore is 6.  ?Psychiatric:     ?   Mood and Affect: Mood normal.     ?   Behavior: Behavior normal.  ? ? ?ED Results / Procedures / Treatments   ?Labs ?(all labs ordered are listed, but only abnormal results are displayed) ?Labs Reviewed  ?BRAIN NATRIURETIC PEPTIDE - Abnormal; Notable for the following components:  ?    Result Value  ? B Natriuretic Peptide 314.3 (*)   ? All other components within normal limits  ?BASIC METABOLIC PANEL - Abnormal; Notable for the following components:  ? BUN 31 (*)   ? Creatinine, Ser 7.13 (*)   ? GFR, Estimated 7 (*)   ? All other components within normal limits  ?CBC WITH DIFFERENTIAL/PLATELET - Abnormal; Notable for the following components:  ? RBC 2.10 (*)   ? Hemoglobin 6.5 (*)   ? HCT 19.4  (*)   ? All other components within normal limits  ?D-DIMER, QUANTITATIVE - Abnormal; Notable for the following components:  ? D-Dimer, Quant 1.52 (*)   ? All other components within normal limits  ?HCG, SERUM, QUALITATIVE  ?TYPE AND SCREEN  ?PREPARE RBC (CROSSMATCH)  ?PREPARE RBC (CROSSMATCH)  ?TROPONIN I (HIGH SENSITIVITY)  ?TROPONIN I (HIGH SENSITIVITY)  ? ? ?EKG ?EKG Interpretation ? ?Date/Time:  Friday November 08 2021 06:39:38  EDT ?Ventricular Rate:  83 ?PR Interval:  151 ?QRS Duration: 90 ?QT Interval:  409 ?QTC Calculation: 481 ?R Axis:   62 ?Text Interpretation: Sinus rhythm Borderline prolonged QT interval When compared with ECG of 06/22/2021, No significant change was found Confirmed by Delora Fuel ([id number]) on 11/08/2021 6:48:52 AM ? ?Radiology ?CT Angio Chest PE W and/or Wo Contrast ? ?Result Date: 11/08/2021 ?CLINICAL DATA:  Chest pain and shortness of breath. Suspected pulmonary embolism. Positive D-dimer. EXAM: CT ANGIOGRAPHY CHEST WITH CONTRAST TECHNIQUE: Multidetector CT imaging of the chest was performed using the standard protocol during bolus administration of intravenous contrast. Multiplanar CT image reconstructions and MIPs were obtained to evaluate the vascular anatomy. RADIATION DOSE REDUCTION: This exam was performed according to the departmental dose-optimization program which includes automated exposure control, adjustment of the mA and/or kV according to patient size and/or use of iterative reconstruction technique. CONTRAST:  55m OMNIPAQUE IOHEXOL 350 MG/ML SOLN COMPARISON:  Current chest radiograph and prior studies. FINDINGS: Cardiovascular: Pulmonary arteries are well opacified. There is no evidence of a pulmonary embolism. Heart is normal in size. Small pericardial effusion. Normal great vessels. Mediastinum/Nodes: Visualized thyroid is unremarkable. Several shotty right paratracheal mediastinal lymph nodes, largest an azygous level node measuring 1.2 cm in short axis. No mediastinal or  hilar masses. Trachea and esophagus are unremarkable. Lungs/Pleura: Bilateral interstitial thickening. This includes bronchial wall thickening, the latter most evident in the lower lobes. There are areas hazy increa

## 2021-11-08 NOTE — ED Provider Notes (Signed)
MSE was initiated and I personally evaluated the patient and placed orders (if any) at  6:46 AM on November 08, 2021. ? ?The patient appears stable so that the remainder of the MSE may be completed by another provider. ? ?36 year old female with a history of end-stage renal disease on hemodialysis has been having shortness of breath over the last month, woke up with left parasternal chest pain this morning.  EMS noted respiratory distress and gave aspirin, nitroglycerin and nebulizer treatment.  She was initially hypertensive, but with blood pressure has come down.  Patient did get dialyzed today and has not missed any dialysis.  She has required several blood transfusions over the last month.  On exam, she does appear dyspneic but is maintaining good oxygen saturations.  Lungs are clear.  There is no chest wall tenderness.  She does have trace edema. ? ? EKG Interpretation ? ?Date/Time:  Friday November 08 2021 06:39:38 EDT ?Ventricular Rate:  83 ?PR Interval:  151 ?QRS Duration: 90 ?QT Interval:  409 ?QTC Calculation: 481 ?R Axis:   62 ?Text Interpretation: Sinus rhythm Borderline prolonged QT interval When compared with ECG of 06/22/2021, No significant change was found Confirmed by Delora Fuel ([id number]) on 11/08/2021 6:48:52 AM ?  ? ?  ? ? ?  ?Delora Fuel, MD ?[id number] [id number] ? ?

## 2021-11-08 NOTE — ED Notes (Signed)
Dr Sloan Leiter rounding at bedside, states she can have 2nd unit of blood with dialysis. Ordered renal diet for pt. ?

## 2021-11-09 DIAGNOSIS — D649 Anemia, unspecified: Secondary | ICD-10-CM | POA: Diagnosis not present

## 2021-11-09 LAB — CBC
HCT: 25 % — ABNORMAL LOW (ref 36.0–46.0)
Hemoglobin: 9 g/dL — ABNORMAL LOW (ref 12.0–15.0)
MCH: 30.7 pg (ref 26.0–34.0)
MCHC: 36 g/dL (ref 30.0–36.0)
MCV: 85.3 fL (ref 80.0–100.0)
Platelets: 171 10*3/uL (ref 150–400)
RBC: 2.93 MIL/uL — ABNORMAL LOW (ref 3.87–5.11)
RDW: 13.8 % (ref 11.5–15.5)
WBC: 8.6 10*3/uL (ref 4.0–10.5)
nRBC: 0 % (ref 0.0–0.2)

## 2021-11-09 LAB — HIV ANTIBODY (ROUTINE TESTING W REFLEX): HIV Screen 4th Generation wRfx: NONREACTIVE

## 2021-11-09 MED ORDER — LOSARTAN POTASSIUM 50 MG PO TABS: 100.0000 mg | ORAL_TABLET | Freq: Every day | ORAL | Status: DC

## 2021-11-09 MED ORDER — ACETAMINOPHEN 500 MG PO TABS: 1000.0000 mg | ORAL_TABLET | Freq: Four times a day (QID) | ORAL | Status: DC | PRN

## 2021-11-09 MED ORDER — LEVOTHYROXINE SODIUM 100 MCG PO TABS
200.0000 ug | ORAL_TABLET | Freq: Every day | ORAL | Status: DC
  Administered 2021-11-09: 200 ug via ORAL
  Filled 2021-11-09: qty 2

## 2021-11-09 MED ORDER — HEPARIN SODIUM (PORCINE) 5000 UNIT/ML IJ SOLN
5000.0000 [IU] | Freq: Three times a day (TID) | INTRAMUSCULAR | Status: DC
  Administered 2021-11-09: 5000 [IU] via SUBCUTANEOUS
  Filled 2021-11-09: qty 1

## 2021-11-09 MED ORDER — ACETAMINOPHEN 325 MG PO TABS
650.0000 mg | ORAL_TABLET | Freq: Four times a day (QID) | ORAL | Status: DC | PRN
  Administered 2021-11-09: 650 mg via ORAL
  Filled 2021-11-09: qty 2

## 2021-11-09 MED ORDER — AMLODIPINE BESYLATE 5 MG PO TABS
5.0000 mg | ORAL_TABLET | Freq: Every day | ORAL | Status: DC
  Administered 2021-11-09: 5 mg via ORAL
  Filled 2021-11-09: qty 1

## 2021-11-09 MED ORDER — AMLODIPINE BESYLATE 5 MG PO TABS: 5.0000 mg | ORAL_TABLET | Freq: Every day | ORAL | Status: DC

## 2021-11-09 MED ORDER — ACETAMINOPHEN 325 MG PO TABS: 650.0000 mg | ORAL_TABLET | Freq: Four times a day (QID) | ORAL | Status: DC | PRN

## 2021-11-09 MED ORDER — CARVEDILOL 12.5 MG PO TABS
12.5000 mg | ORAL_TABLET | Freq: Two times a day (BID) | ORAL | Status: DC
  Administered 2021-11-09: 12.5 mg via ORAL
  Filled 2021-11-09: qty 1

## 2021-11-09 NOTE — Discharge Summary (Signed)
Physician Discharge Summary  ?Holly [name] KVQ:[id number] DOB: [birth year] DOA: 11/08/2021 ? ?PCP: Pcp, No ? ?Admit date: 11/08/2021 ?Discharge date: 11/09/2021 ? ?Admitted From: Home ?Disposition: Home ? ?Recommendations for Outpatient Follow-up:  ? ?Go to your hemodialysis 3/20.  Repeat hemoglobin. ? ?Home Health: N/A ?Equipment/Devices: N/A ? ?Discharge Condition: Stable ?CODE STATUS: Full code ?Diet recommendation: Low-salt diet ? ?Discharge summary: ?36 year old with history of hypertension, ESRD on hemodialysis TTS, anemia of chronic disease with recent blood transfusion 2 weeks ago, baseline hemoglobin 8-9 came to ER, woke up early morning with sudden onset of shortness of breath and weakness.  In the emergency room initially on 2 to 3 L of oxygen but saturating adequate.  Hemoglobin 6.5.  CT angiogram negative for pulmonary embolism, showed pulmonary edema.  Received 1 unit of PRBC in the emergency room and started having more shortness of breath requiring hemodialysis.  Admitted to monitoring to the hospital, additional 1 unit of PRBC transfused with overnight hemodialysis with improvement of symptoms.  Currently on room air.  Blood pressure slightly elevated but will follow up with outpatient nephrology to adjust her medications. ? ?Hemoglobin appropriately responded after 2 units of PRBC transfusion, no evidence of active bleeding.  Symptomatically improved.  Seen by nephrology who recommended next dialysis on 3/20.  Able to go home today. ? ? ? ?Discharge Diagnoses:  ?Principal Problem: ?  Symptomatic anemia ?Active Problems: ?  ESRD on dialysis [facility]) ?  Essential hypertension ?  Hypothyroidism ? ? ? ?Discharge Instructions ? ?Discharge Instructions   ? ? Call MD for:  difficulty breathing, headache or visual disturbances   Complete by: As directed ?  ? Diet - low sodium heart healthy   Complete by: As directed ?  ? Increase activity slowly   Complete by: As directed ?  ? ?  ? ?Allergies as of 11/09/2021    ? ?   Reactions  ? Rituximab Anaphylaxis  ? Bupropion Other (See Comments)  ? Caused seizures  ? Ciprofloxacin Other (See Comments)  ? Caused Seizures   ? Levetiracetam Other (See Comments)  ? Medical Record H&P (unknown reaction)  ? Oxycodone-acetaminophen Nausea Only  ? Tramadol Other (See Comments)  ? Caused seizures  ? ?  ? ?  ?Medication List  ?  ? ?TAKE these medications   ? ?acetaminophen 500 MG tablet ?Commonly known as: TYLENOL ?Take 1,000 mg by mouth every 6 (six) hours as needed for headache (pain). ?  ?acyclovir 200 MG capsule ?Commonly known as: ZOVIRAX ?Take 200 mg by mouth 2 (two) times daily as needed (breakouts). ?  ?amLODipine 5 MG tablet ?Commonly known as: NORVASC ?Take 5 mg by mouth at bedtime. ?  ?carvedilol 12.5 MG tablet ?Commonly known as: COREG ?Take 12.5 mg by mouth 2 (two) times daily with a meal. ?  ?cinacalcet 60 MG tablet ?Commonly known as: SENSIPAR ?Take 60 mg by mouth at bedtime. ?  ?diclofenac Sodium 1 % Gel ?Commonly known as: VOLTAREN ?Apply 1 application. topically 4 (four) times daily as needed (pain). ?  ?lanthanum 1000 MG chewable tablet ?Commonly known as: FOSRENOL ?Chew 1,000 mg by mouth 3 (three) times daily with meals. ?  ?levothyroxine 200 MCG tablet ?Commonly known as: SYNTHROID ?Take 200 mcg by mouth daily before breakfast. ?  ?losartan 100 MG tablet ?Commonly known as: COZAAR ?Take 100 mg by mouth at bedtime. ?  ? ?  ? ? ?Allergies  ?Allergen Reactions  ? Rituximab Anaphylaxis  ? Bupropion Other (See Comments)  ?  Caused seizures  ? Ciprofloxacin Other (See Comments)  ?  Caused Seizures   ? Levetiracetam Other (See Comments)  ?  Medical Record H&P (unknown reaction)  ? Oxycodone-Acetaminophen Nausea Only  ? Tramadol Other (See Comments)  ?  Caused seizures  ? ? ?Consultations: ?Nephrology ? ? ?Procedures/Studies: ?CT Angio Chest PE W and/or Wo Contrast ? ?Result Date: 11/08/2021 ?CLINICAL DATA:  Chest pain and shortness of breath. Suspected pulmonary embolism.  Positive D-dimer. EXAM: CT ANGIOGRAPHY CHEST WITH CONTRAST TECHNIQUE: Multidetector CT imaging of the chest was performed using the standard protocol during bolus administration of intravenous contrast. Multiplanar CT image reconstructions and MIPs were obtained to evaluate the vascular anatomy. RADIATION DOSE REDUCTION: This exam was performed according to the departmental dose-optimization program which includes automated exposure control, adjustment of the mA and/or kV according to patient size and/or use of iterative reconstruction technique. CONTRAST:  4m OMNIPAQUE IOHEXOL 350 MG/ML SOLN COMPARISON:  Current chest radiograph and prior studies. FINDINGS: Cardiovascular: Pulmonary arteries are well opacified. There is no evidence of a pulmonary embolism. Heart is normal in size. Small pericardial effusion. Normal great vessels. Mediastinum/Nodes: Visualized thyroid is unremarkable. Several shotty right paratracheal mediastinal lymph nodes, largest an azygous level node measuring 1.2 cm in short axis. No mediastinal or hilar masses. Trachea and esophagus are unremarkable. Lungs/Pleura: Bilateral interstitial thickening. This includes bronchial wall thickening, the latter most evident in the lower lobes. There are areas hazy increased attenuation adjacent to the lower attenuation areas of the lungs, a mosaic pattern that is nonspecific, but suggests small airways disease and or vascular shunting. Trace bilateral pleural effusions. No pneumothorax. Upper Abdomen: Visualized upper kidneys show cortical thinning. Trace ascites. Musculoskeletal: No fracture or acute finding.  No bone lesion. Review of the MIP images confirms the above findings. IMPRESSION: 1. No evidence of a pulmonary embolism. 2. Small pericardial effusion, trace pleural effusions and trace ascites and bilateral interstitial thickening. Findings consistent with pulmonary edema/anasarca. There is also bronchial wall thickening and the subtle areas  of ground-glass opacity in the lungs, which create a mosaic type pattern, suspected to be from air trapping small airways disease. Electronically Signed   By: DLajean ManesM.D.   On: 11/08/2021 14:13  ? ?DG Chest Port 1 View ? ?Result Date: 11/08/2021 ?CLINICAL DATA:  36year old female with chest pain and shortness of breath. Dialysis patient. EXAM: PORTABLE CHEST 1 VIEW COMPARISON:  10/08/2021 and earlier. FINDINGS: Portable AP semi upright view at 0707 hours. Stable right chest dual lumen dialysis catheter. Mildly lordotic view. Lung volumes and mediastinal contours are stable, cardiac size at the upper limits of normal. Visualized tracheal air column is within normal limits. Allowing for portable technique the lungs are clear. No pulmonary edema or pleural effusion. No acute osseous abnormality identified. IMPRESSION: No acute cardiopulmonary abnormality. Electronically Signed   By: HGenevie AnnM.D.   On: 11/08/2021 07:42   ?(Echo, Carotid, EGD, Colonoscopy, ERCP)  ? ? ?Subjective: Patient seen and examined.  Denies any complaints.  Denies any shortness of breath.  Able to get around in the room. ? ? ?Discharge Exam: ?Vitals:  ? 11/09/21 0511 11/09/21 0802  ?BP: (!) 174/119 (!) 170/105  ?Pulse: 92 91  ?Resp: 19 16  ?Temp: 98.9 ?F (37.2 ?C) 98 ?F (36.7 ?C)  ?SpO2: 96%   ? ?Vitals:  ? 11/09/21 0411 11/09/21 0441 11/09/21 0511 11/09/21 0802  ?BP: (!) 168/107 (!) 167/94 (!) 174/119 (!) 170/105  ?Pulse: 87 87 92 91  ?Resp: 13  $'13 19 16  'C$ ?Temp:   98.9 ?F (37.2 ?C) 98 ?F (36.7 ?C)  ?TempSrc:   Oral Oral  ?SpO2: 99% 98% 96%   ?Weight:   88 kg   ?Height:      ? ? ?General: Pt is alert, awake, not in acute distress ?Cardiovascular: RRR, S1/S2 +, no rubs, no gallops ?Respiratory: CTA bilaterally, no wheezing, no rhonchi ?Abdominal: Soft, NT, ND, bowel sounds + ?Extremities: AV fistula right arm. ? ? ? ?The results of significant diagnostics from this hospitalization (including imaging, microbiology, ancillary and laboratory)  are listed below for reference.   ? ? ?Microbiology: ?No results found for this or any previous visit (from the past 240 hour(s)).  ? ?Labs: ?BNP (last 3 results) ?Recent Labs  ?  11/08/21 ?0636  ?BNP 314.3*

## 2021-11-09 NOTE — Progress Notes (Signed)
Pt is currently off the unit at Dialysis. ?

## 2021-11-09 NOTE — Progress Notes (Signed)
?[facility] ?Progress Note  ? ?36 y.o. female with ESRD on HD TTS at [facility].  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 PD for 4-5 years and transitioned to HD bec of staph infection within her PD catheter and previously underwent a PD catheter revision by Dr. Raul Del.  ? ?Assessment/ Plan:   ?Symptomatic anemia/volume overload-baseline Hgb 8-9. S/p 1 unit PRBC given 3/17. ?ESRD - on HD TTS. Did very well on dialysis very early this AM with 3.6L net UF tolerated without cramping.  ?- She is due for dialysis on Monday to start home training for HD.  ?- From renal standpoint ok for d/c and f/u with her home unit on Monday for dialysis + training. Can still continue challenging her EDW. Down to 88 kg and likely true EDW even lower. ? ?Hypertension/volume-hypervolemic on exam, Bps high, hopeful HD will improve after HD. Noted 1x doses Norvasc and Carvedilol given. ?Secondary Hyperparathyroidism - Ca 9.2, no PO4 yet ?Nutrition - Renal diet on fluid restriction ? ?Subjective:   ?Breathing much improved. Denies f/c/n/v/ dyspnea/ CP.  ? ?Objective:   ?BP (!) 174/119 (BP Location: Right Arm)   Pulse 92   Temp 98.9 ?F (37.2 ?C) (Oral)   Resp 19   Ht '5\' 7"'$  (1.702 m)   Wt 88 kg   SpO2 96%   BMI 30.39 kg/m?  ? ?Intake/Output Summary (Last 24 hours) at 11/09/2021 0731 ?Last data filed at 11/09/2021 0511 ?Gross per 24 hour  ?Intake 350 ml  ?Output 3602 ml  ?Net -3252 ml  ? ?Weight change: 0.813 kg ? ?Physical Exam:GEN: NAD, A&Ox3, NCAT ?HEENT: No conjunctival pallor, EOMI ?NECK: Supple, no thyromegaly ?LUNGS: CTA B/L no rales, rhonchi or wheezing ?CV: RRR, No M/R/G ?ABD: SNDNT +BS  ?EXT: No lower extremity edema ?ACCESS: RIJ TC ? ? ? ?Imaging: ?CT Angio Chest PE W and/or Wo Contrast ? ?Result Date: 11/08/2021 ?CLINICAL DATA:  Chest pain and shortness of breath. Suspected pulmonary  embolism. Positive D-dimer. EXAM: CT ANGIOGRAPHY CHEST WITH CONTRAST TECHNIQUE: Multidetector CT imaging of the chest was performed using the standard protocol during bolus administration of intravenous contrast. Multiplanar CT image reconstructions and MIPs were obtained to evaluate the vascular anatomy. RADIATION DOSE REDUCTION: This exam was performed according to the departmental dose-optimization program which includes automated exposure control, adjustment of the mA and/or kV according to patient size and/or use of iterative reconstruction technique. CONTRAST:  92m OMNIPAQUE IOHEXOL 350 MG/ML SOLN COMPARISON:  Current chest radiograph and prior studies. FINDINGS: Cardiovascular: Pulmonary arteries are well opacified. There is no evidence of a pulmonary embolism. Heart is normal in size. Small pericardial effusion. Normal great vessels. Mediastinum/Nodes: Visualized thyroid is unremarkable. Several shotty right paratracheal mediastinal lymph nodes, largest an azygous level node measuring 1.2 cm in short axis. No mediastinal or hilar masses. Trachea and esophagus are unremarkable. Lungs/Pleura: Bilateral interstitial thickening. This includes bronchial wall thickening, the latter most evident in the lower lobes. There are areas hazy increased attenuation adjacent to the lower attenuation areas of the lungs, a mosaic pattern that is nonspecific, but suggests small airways disease and or vascular shunting. Trace bilateral pleural effusions. No pneumothorax. Upper Abdomen: Visualized upper kidneys show cortical thinning. Trace ascites. Musculoskeletal: No fracture or acute finding.  No bone lesion. Review of the MIP images confirms the above findings. IMPRESSION: 1. No evidence of a pulmonary embolism. 2. Small pericardial effusion,  trace pleural effusions and trace ascites and bilateral interstitial thickening. Findings consistent with pulmonary edema/anasarca. There is also bronchial wall thickening and the  subtle areas of ground-glass opacity in the lungs, which create a mosaic type pattern, suspected to be from air trapping small airways disease. Electronically Signed   By: Lajean Manes M.D.   On: 11/08/2021 14:13  ? ?DG Chest Port 1 View ? ?Result Date: 11/08/2021 ?CLINICAL DATA:  36 year old female with chest pain and shortness of breath. Dialysis patient. EXAM: PORTABLE CHEST 1 VIEW COMPARISON:  10/08/2021 and earlier. FINDINGS: Portable AP semi upright view at 0707 hours. Stable right chest dual lumen dialysis catheter. Mildly lordotic view. Lung volumes and mediastinal contours are stable, cardiac size at the upper limits of normal. Visualized tracheal air column is within normal limits. Allowing for portable technique the lungs are clear. No pulmonary edema or pleural effusion. No acute osseous abnormality identified. IMPRESSION: No acute cardiopulmonary abnormality. Electronically Signed   By: Genevie Ann M.D.   On: 11/08/2021 07:42   ? ?Labs: ?BMET ?Recent Labs  ?Lab 11/08/21 ?0636  ?NA 136  ?K 4.9  ?CL 99  ?CO2 26  ?GLUCOSE 90  ?BUN 31*  ?CREATININE 7.13*  ?CALCIUM 9.2  ? ?CBC ?Recent Labs  ?Lab 11/08/21 ?0636  ?WBC 8.0  ?NEUTROABS 5.4  ?HGB 6.5*  ?HCT 19.4*  ?MCV 92.4  ?PLT 219  ? ? ?Medications:   ? ? sodium chloride   Intravenous Once  ? Chlorhexidine Gluconate Cloth  6 each Topical Q0600  ? feeding supplement (NEPRO CARB STEADY)  237 mL Oral BID BM  ? lidocaine  1 patch Transdermal Q24H  ? pneumococcal 20-valent conjugate vaccine  0.5 mL Intramuscular Tomorrow-1000  ? ? ? ? ?Otelia Santee, MD ?11/09/2021, 7:31 AM  ? ?

## 2021-11-11 DIAGNOSIS — N2581 Secondary hyperparathyroidism of renal origin: Secondary | ICD-10-CM | POA: Diagnosis not present

## 2021-11-11 DIAGNOSIS — N186 End stage renal disease: Secondary | ICD-10-CM | POA: Diagnosis not present

## 2021-11-11 DIAGNOSIS — Z992 Dependence on renal dialysis: Secondary | ICD-10-CM | POA: Diagnosis not present

## 2021-11-11 DIAGNOSIS — E8779 Other fluid overload: Secondary | ICD-10-CM | POA: Diagnosis not present

## 2021-11-11 NOTE — Progress Notes (Signed)
Late Entry Note: ? ?Received voicemail from weekend renal NP with request to send pt's d/c summary and renal notes to [facility] for continuation of care. Documents faxed to fax#[phone]. Spoke to [place] at clinic who confirms pt is an active pt at clinic.  ? ?Melven Sartorius ?Renal Navigator ?[phone] ?

## 2021-11-12 DIAGNOSIS — E8779 Other fluid overload: Secondary | ICD-10-CM | POA: Diagnosis not present

## 2021-11-12 DIAGNOSIS — N186 End stage renal disease: Secondary | ICD-10-CM | POA: Diagnosis not present

## 2021-11-12 DIAGNOSIS — N2581 Secondary hyperparathyroidism of renal origin: Secondary | ICD-10-CM | POA: Diagnosis not present

## 2021-11-12 DIAGNOSIS — Z992 Dependence on renal dialysis: Secondary | ICD-10-CM | POA: Diagnosis not present

## 2021-11-12 LAB — TYPE AND SCREEN
ABO/RH(D): O POS
Antibody Screen: NEGATIVE
Donor AG Type: NEGATIVE
Donor AG Type: NEGATIVE
Unit division: 0
Unit division: 0
Unit division: 0
Unit division: 0

## 2021-11-12 LAB — BPAM RBC
Blood Product Expiration Date: 202304102359
Blood Product Expiration Date: 202304102359
Blood Product Expiration Date: 202304112359
Blood Product Expiration Date: 202304162359
ISSUE DATE / TIME: 202303171051
ISSUE DATE / TIME: 202303180339
Unit Type and Rh: 5100
Unit Type and Rh: 5100
Unit Type and Rh: 5100
Unit Type and Rh: 9500

## 2021-11-13 DIAGNOSIS — N186 End stage renal disease: Secondary | ICD-10-CM | POA: Diagnosis not present

## 2021-11-13 DIAGNOSIS — Z992 Dependence on renal dialysis: Secondary | ICD-10-CM | POA: Diagnosis not present

## 2021-11-13 DIAGNOSIS — E8779 Other fluid overload: Secondary | ICD-10-CM | POA: Diagnosis not present

## 2021-11-13 DIAGNOSIS — N2581 Secondary hyperparathyroidism of renal origin: Secondary | ICD-10-CM | POA: Diagnosis not present

## 2021-11-14 DIAGNOSIS — E8779 Other fluid overload: Secondary | ICD-10-CM | POA: Diagnosis not present

## 2021-11-14 DIAGNOSIS — Z992 Dependence on renal dialysis: Secondary | ICD-10-CM | POA: Diagnosis not present

## 2021-11-14 DIAGNOSIS — N2581 Secondary hyperparathyroidism of renal origin: Secondary | ICD-10-CM | POA: Diagnosis not present

## 2021-11-14 DIAGNOSIS — N186 End stage renal disease: Secondary | ICD-10-CM | POA: Diagnosis not present

## 2021-11-15 DIAGNOSIS — E8779 Other fluid overload: Secondary | ICD-10-CM | POA: Diagnosis not present

## 2021-11-15 DIAGNOSIS — N186 End stage renal disease: Secondary | ICD-10-CM | POA: Diagnosis not present

## 2021-11-15 DIAGNOSIS — Z992 Dependence on renal dialysis: Secondary | ICD-10-CM | POA: Diagnosis not present

## 2021-11-15 DIAGNOSIS — N2581 Secondary hyperparathyroidism of renal origin: Secondary | ICD-10-CM | POA: Diagnosis not present

## 2021-11-15 LAB — HEPATITIS B SURFACE ANTIBODY, QUANTITATIVE: Hep B S AB Quant (Post): 311.9 m[IU]/mL (ref >9.9)

## 2021-11-18 DIAGNOSIS — N2581 Secondary hyperparathyroidism of renal origin: Secondary | ICD-10-CM | POA: Diagnosis not present

## 2021-11-18 DIAGNOSIS — E8779 Other fluid overload: Secondary | ICD-10-CM | POA: Diagnosis not present

## 2021-11-18 DIAGNOSIS — N186 End stage renal disease: Secondary | ICD-10-CM | POA: Diagnosis not present

## 2021-11-18 DIAGNOSIS — Z992 Dependence on renal dialysis: Secondary | ICD-10-CM | POA: Diagnosis not present

## 2021-11-19 DIAGNOSIS — N186 End stage renal disease: Secondary | ICD-10-CM | POA: Diagnosis not present

## 2021-11-19 DIAGNOSIS — Z992 Dependence on renal dialysis: Secondary | ICD-10-CM | POA: Diagnosis not present

## 2021-11-19 DIAGNOSIS — E8779 Other fluid overload: Secondary | ICD-10-CM | POA: Diagnosis not present

## 2021-11-20 DIAGNOSIS — Z992 Dependence on renal dialysis: Secondary | ICD-10-CM | POA: Diagnosis not present

## 2021-11-20 DIAGNOSIS — N2581 Secondary hyperparathyroidism of renal origin: Secondary | ICD-10-CM | POA: Diagnosis not present

## 2021-11-20 DIAGNOSIS — N186 End stage renal disease: Secondary | ICD-10-CM | POA: Diagnosis not present

## 2021-11-20 DIAGNOSIS — E8779 Other fluid overload: Secondary | ICD-10-CM | POA: Diagnosis not present

## 2021-11-21 DIAGNOSIS — Z992 Dependence on renal dialysis: Secondary | ICD-10-CM | POA: Diagnosis not present

## 2021-11-21 DIAGNOSIS — N2581 Secondary hyperparathyroidism of renal origin: Secondary | ICD-10-CM | POA: Diagnosis not present

## 2021-11-21 DIAGNOSIS — E8779 Other fluid overload: Secondary | ICD-10-CM | POA: Diagnosis not present

## 2021-11-21 DIAGNOSIS — N186 End stage renal disease: Secondary | ICD-10-CM | POA: Diagnosis not present

## 2021-11-22 DIAGNOSIS — N186 End stage renal disease: Secondary | ICD-10-CM | POA: Diagnosis not present

## 2021-11-22 DIAGNOSIS — Z992 Dependence on renal dialysis: Secondary | ICD-10-CM | POA: Diagnosis not present

## 2021-11-23 DIAGNOSIS — Z992 Dependence on renal dialysis: Secondary | ICD-10-CM | POA: Diagnosis not present

## 2021-11-23 DIAGNOSIS — N186 End stage renal disease: Secondary | ICD-10-CM | POA: Diagnosis not present

## 2021-11-25 DIAGNOSIS — N186 End stage renal disease: Secondary | ICD-10-CM | POA: Diagnosis not present

## 2021-11-25 DIAGNOSIS — N2581 Secondary hyperparathyroidism of renal origin: Secondary | ICD-10-CM | POA: Diagnosis not present

## 2021-11-25 DIAGNOSIS — T82898A Other specified complication of vascular prosthetic devices, implants and grafts, initial encounter: Secondary | ICD-10-CM | POA: Diagnosis not present

## 2021-11-25 DIAGNOSIS — Z992 Dependence on renal dialysis: Secondary | ICD-10-CM | POA: Diagnosis not present

## 2021-11-25 DIAGNOSIS — E8779 Other fluid overload: Secondary | ICD-10-CM | POA: Diagnosis not present

## 2021-11-26 ENCOUNTER — Other Ambulatory Visit (INDEPENDENT_AMBULATORY_CARE_PROVIDER_SITE_OTHER): Payer: Self-pay | Admitting: Nurse Practitioner

## 2021-11-26 DIAGNOSIS — N186 End stage renal disease: Secondary | ICD-10-CM | POA: Diagnosis not present

## 2021-11-26 DIAGNOSIS — E8779 Other fluid overload: Secondary | ICD-10-CM | POA: Diagnosis not present

## 2021-11-26 DIAGNOSIS — Z992 Dependence on renal dialysis: Secondary | ICD-10-CM | POA: Diagnosis not present

## 2021-11-26 DIAGNOSIS — N185 Chronic kidney disease, stage 5: Secondary | ICD-10-CM

## 2021-11-26 DIAGNOSIS — T82898A Other specified complication of vascular prosthetic devices, implants and grafts, initial encounter: Secondary | ICD-10-CM | POA: Diagnosis not present

## 2021-11-26 DIAGNOSIS — N2581 Secondary hyperparathyroidism of renal origin: Secondary | ICD-10-CM | POA: Diagnosis not present

## 2021-11-27 ENCOUNTER — Other Ambulatory Visit (INDEPENDENT_AMBULATORY_CARE_PROVIDER_SITE_OTHER): Payer: BC Managed Care – PPO

## 2021-11-27 ENCOUNTER — Encounter (INDEPENDENT_AMBULATORY_CARE_PROVIDER_SITE_OTHER): Payer: BC Managed Care – PPO

## 2021-11-27 ENCOUNTER — Encounter (INDEPENDENT_AMBULATORY_CARE_PROVIDER_SITE_OTHER): Payer: BC Managed Care – PPO | Admitting: Nurse Practitioner

## 2021-11-27 DIAGNOSIS — Z992 Dependence on renal dialysis: Secondary | ICD-10-CM | POA: Diagnosis not present

## 2021-11-27 DIAGNOSIS — N186 End stage renal disease: Secondary | ICD-10-CM | POA: Diagnosis not present

## 2021-11-27 DIAGNOSIS — E8779 Other fluid overload: Secondary | ICD-10-CM | POA: Diagnosis not present

## 2021-11-27 DIAGNOSIS — T82898A Other specified complication of vascular prosthetic devices, implants and grafts, initial encounter: Secondary | ICD-10-CM | POA: Diagnosis not present

## 2021-11-27 DIAGNOSIS — N2581 Secondary hyperparathyroidism of renal origin: Secondary | ICD-10-CM | POA: Diagnosis not present

## 2021-11-28 DIAGNOSIS — T82898A Other specified complication of vascular prosthetic devices, implants and grafts, initial encounter: Secondary | ICD-10-CM | POA: Diagnosis not present

## 2021-11-28 DIAGNOSIS — Z992 Dependence on renal dialysis: Secondary | ICD-10-CM | POA: Diagnosis not present

## 2021-11-28 DIAGNOSIS — N186 End stage renal disease: Secondary | ICD-10-CM | POA: Diagnosis not present

## 2021-11-28 DIAGNOSIS — E8779 Other fluid overload: Secondary | ICD-10-CM | POA: Diagnosis not present

## 2021-11-28 DIAGNOSIS — N2581 Secondary hyperparathyroidism of renal origin: Secondary | ICD-10-CM | POA: Diagnosis not present

## 2021-11-29 DIAGNOSIS — N2581 Secondary hyperparathyroidism of renal origin: Secondary | ICD-10-CM | POA: Diagnosis not present

## 2021-11-29 DIAGNOSIS — T82898A Other specified complication of vascular prosthetic devices, implants and grafts, initial encounter: Secondary | ICD-10-CM | POA: Diagnosis not present

## 2021-11-29 DIAGNOSIS — E8779 Other fluid overload: Secondary | ICD-10-CM | POA: Diagnosis not present

## 2021-11-29 DIAGNOSIS — Z992 Dependence on renal dialysis: Secondary | ICD-10-CM | POA: Diagnosis not present

## 2021-11-29 DIAGNOSIS — N186 End stage renal disease: Secondary | ICD-10-CM | POA: Diagnosis not present

## 2021-11-30 DIAGNOSIS — Z992 Dependence on renal dialysis: Secondary | ICD-10-CM | POA: Diagnosis not present

## 2021-11-30 DIAGNOSIS — N2581 Secondary hyperparathyroidism of renal origin: Secondary | ICD-10-CM | POA: Diagnosis not present

## 2021-11-30 DIAGNOSIS — E8779 Other fluid overload: Secondary | ICD-10-CM | POA: Diagnosis not present

## 2021-11-30 DIAGNOSIS — T82898A Other specified complication of vascular prosthetic devices, implants and grafts, initial encounter: Secondary | ICD-10-CM | POA: Diagnosis not present

## 2021-11-30 DIAGNOSIS — N186 End stage renal disease: Secondary | ICD-10-CM | POA: Diagnosis not present

## 2021-12-01 DIAGNOSIS — N186 End stage renal disease: Secondary | ICD-10-CM | POA: Diagnosis not present

## 2021-12-01 DIAGNOSIS — Z992 Dependence on renal dialysis: Secondary | ICD-10-CM | POA: Diagnosis not present

## 2021-12-01 DIAGNOSIS — N2581 Secondary hyperparathyroidism of renal origin: Secondary | ICD-10-CM | POA: Diagnosis not present

## 2021-12-01 DIAGNOSIS — T82898A Other specified complication of vascular prosthetic devices, implants and grafts, initial encounter: Secondary | ICD-10-CM | POA: Diagnosis not present

## 2021-12-01 DIAGNOSIS — E8779 Other fluid overload: Secondary | ICD-10-CM | POA: Diagnosis not present

## 2021-12-02 DIAGNOSIS — Z992 Dependence on renal dialysis: Secondary | ICD-10-CM | POA: Diagnosis not present

## 2021-12-02 DIAGNOSIS — T82898A Other specified complication of vascular prosthetic devices, implants and grafts, initial encounter: Secondary | ICD-10-CM | POA: Diagnosis not present

## 2021-12-02 DIAGNOSIS — N186 End stage renal disease: Secondary | ICD-10-CM | POA: Diagnosis not present

## 2021-12-02 DIAGNOSIS — E8779 Other fluid overload: Secondary | ICD-10-CM | POA: Diagnosis not present

## 2021-12-02 DIAGNOSIS — N2581 Secondary hyperparathyroidism of renal origin: Secondary | ICD-10-CM | POA: Diagnosis not present

## 2021-12-03 DIAGNOSIS — T82898A Other specified complication of vascular prosthetic devices, implants and grafts, initial encounter: Secondary | ICD-10-CM | POA: Diagnosis not present

## 2021-12-03 DIAGNOSIS — Z992 Dependence on renal dialysis: Secondary | ICD-10-CM | POA: Diagnosis not present

## 2021-12-03 DIAGNOSIS — E8779 Other fluid overload: Secondary | ICD-10-CM | POA: Diagnosis not present

## 2021-12-03 DIAGNOSIS — N186 End stage renal disease: Secondary | ICD-10-CM | POA: Diagnosis not present

## 2021-12-03 DIAGNOSIS — N2581 Secondary hyperparathyroidism of renal origin: Secondary | ICD-10-CM | POA: Diagnosis not present

## 2021-12-04 DIAGNOSIS — E8779 Other fluid overload: Secondary | ICD-10-CM | POA: Diagnosis not present

## 2021-12-04 DIAGNOSIS — T82898A Other specified complication of vascular prosthetic devices, implants and grafts, initial encounter: Secondary | ICD-10-CM | POA: Diagnosis not present

## 2021-12-04 DIAGNOSIS — Z992 Dependence on renal dialysis: Secondary | ICD-10-CM | POA: Diagnosis not present

## 2021-12-04 DIAGNOSIS — N186 End stage renal disease: Secondary | ICD-10-CM | POA: Diagnosis not present

## 2021-12-04 DIAGNOSIS — N2581 Secondary hyperparathyroidism of renal origin: Secondary | ICD-10-CM | POA: Diagnosis not present

## 2021-12-05 DIAGNOSIS — T82898A Other specified complication of vascular prosthetic devices, implants and grafts, initial encounter: Secondary | ICD-10-CM | POA: Diagnosis not present

## 2021-12-05 DIAGNOSIS — N186 End stage renal disease: Secondary | ICD-10-CM | POA: Diagnosis not present

## 2021-12-05 DIAGNOSIS — N2581 Secondary hyperparathyroidism of renal origin: Secondary | ICD-10-CM | POA: Diagnosis not present

## 2021-12-05 DIAGNOSIS — E8779 Other fluid overload: Secondary | ICD-10-CM | POA: Diagnosis not present

## 2021-12-05 DIAGNOSIS — Z992 Dependence on renal dialysis: Secondary | ICD-10-CM | POA: Diagnosis not present

## 2021-12-06 DIAGNOSIS — Z992 Dependence on renal dialysis: Secondary | ICD-10-CM | POA: Diagnosis not present

## 2021-12-06 DIAGNOSIS — N186 End stage renal disease: Secondary | ICD-10-CM | POA: Diagnosis not present

## 2021-12-06 DIAGNOSIS — N2581 Secondary hyperparathyroidism of renal origin: Secondary | ICD-10-CM | POA: Diagnosis not present

## 2021-12-06 DIAGNOSIS — E8779 Other fluid overload: Secondary | ICD-10-CM | POA: Diagnosis not present

## 2021-12-06 DIAGNOSIS — T82898A Other specified complication of vascular prosthetic devices, implants and grafts, initial encounter: Secondary | ICD-10-CM | POA: Diagnosis not present

## 2021-12-07 DIAGNOSIS — E8779 Other fluid overload: Secondary | ICD-10-CM | POA: Diagnosis not present

## 2021-12-07 DIAGNOSIS — T82898A Other specified complication of vascular prosthetic devices, implants and grafts, initial encounter: Secondary | ICD-10-CM | POA: Diagnosis not present

## 2021-12-07 DIAGNOSIS — N186 End stage renal disease: Secondary | ICD-10-CM | POA: Diagnosis not present

## 2021-12-07 DIAGNOSIS — N2581 Secondary hyperparathyroidism of renal origin: Secondary | ICD-10-CM | POA: Diagnosis not present

## 2021-12-07 DIAGNOSIS — Z992 Dependence on renal dialysis: Secondary | ICD-10-CM | POA: Diagnosis not present

## 2021-12-08 DIAGNOSIS — E8779 Other fluid overload: Secondary | ICD-10-CM | POA: Diagnosis not present

## 2021-12-08 DIAGNOSIS — T82898A Other specified complication of vascular prosthetic devices, implants and grafts, initial encounter: Secondary | ICD-10-CM | POA: Diagnosis not present

## 2021-12-08 DIAGNOSIS — N186 End stage renal disease: Secondary | ICD-10-CM | POA: Diagnosis not present

## 2021-12-08 DIAGNOSIS — Z992 Dependence on renal dialysis: Secondary | ICD-10-CM | POA: Diagnosis not present

## 2021-12-08 DIAGNOSIS — N2581 Secondary hyperparathyroidism of renal origin: Secondary | ICD-10-CM | POA: Diagnosis not present

## 2021-12-09 DIAGNOSIS — N2581 Secondary hyperparathyroidism of renal origin: Secondary | ICD-10-CM | POA: Diagnosis not present

## 2021-12-09 DIAGNOSIS — N186 End stage renal disease: Secondary | ICD-10-CM | POA: Diagnosis not present

## 2021-12-09 DIAGNOSIS — Z992 Dependence on renal dialysis: Secondary | ICD-10-CM | POA: Diagnosis not present

## 2021-12-09 DIAGNOSIS — T82898A Other specified complication of vascular prosthetic devices, implants and grafts, initial encounter: Secondary | ICD-10-CM | POA: Diagnosis not present

## 2021-12-09 DIAGNOSIS — E8779 Other fluid overload: Secondary | ICD-10-CM | POA: Diagnosis not present

## 2021-12-10 DIAGNOSIS — E8779 Other fluid overload: Secondary | ICD-10-CM | POA: Diagnosis not present

## 2021-12-10 DIAGNOSIS — N2581 Secondary hyperparathyroidism of renal origin: Secondary | ICD-10-CM | POA: Diagnosis not present

## 2021-12-10 DIAGNOSIS — N186 End stage renal disease: Secondary | ICD-10-CM | POA: Diagnosis not present

## 2021-12-10 DIAGNOSIS — T82898A Other specified complication of vascular prosthetic devices, implants and grafts, initial encounter: Secondary | ICD-10-CM | POA: Diagnosis not present

## 2021-12-10 DIAGNOSIS — Z992 Dependence on renal dialysis: Secondary | ICD-10-CM | POA: Diagnosis not present

## 2021-12-11 DIAGNOSIS — T82898A Other specified complication of vascular prosthetic devices, implants and grafts, initial encounter: Secondary | ICD-10-CM | POA: Diagnosis not present

## 2021-12-11 DIAGNOSIS — N186 End stage renal disease: Secondary | ICD-10-CM | POA: Diagnosis not present

## 2021-12-11 DIAGNOSIS — Z992 Dependence on renal dialysis: Secondary | ICD-10-CM | POA: Diagnosis not present

## 2021-12-11 DIAGNOSIS — N2581 Secondary hyperparathyroidism of renal origin: Secondary | ICD-10-CM | POA: Diagnosis not present

## 2021-12-11 DIAGNOSIS — E8779 Other fluid overload: Secondary | ICD-10-CM | POA: Diagnosis not present

## 2021-12-12 DIAGNOSIS — Z992 Dependence on renal dialysis: Secondary | ICD-10-CM | POA: Diagnosis not present

## 2021-12-12 DIAGNOSIS — N186 End stage renal disease: Secondary | ICD-10-CM | POA: Diagnosis not present

## 2021-12-12 DIAGNOSIS — E8779 Other fluid overload: Secondary | ICD-10-CM | POA: Diagnosis not present

## 2021-12-12 DIAGNOSIS — N2581 Secondary hyperparathyroidism of renal origin: Secondary | ICD-10-CM | POA: Diagnosis not present

## 2021-12-12 DIAGNOSIS — T82898A Other specified complication of vascular prosthetic devices, implants and grafts, initial encounter: Secondary | ICD-10-CM | POA: Diagnosis not present

## 2021-12-13 DIAGNOSIS — N186 End stage renal disease: Secondary | ICD-10-CM | POA: Diagnosis not present

## 2021-12-13 DIAGNOSIS — N2581 Secondary hyperparathyroidism of renal origin: Secondary | ICD-10-CM | POA: Diagnosis not present

## 2021-12-13 DIAGNOSIS — E8779 Other fluid overload: Secondary | ICD-10-CM | POA: Diagnosis not present

## 2021-12-13 DIAGNOSIS — T82898A Other specified complication of vascular prosthetic devices, implants and grafts, initial encounter: Secondary | ICD-10-CM | POA: Diagnosis not present

## 2021-12-13 DIAGNOSIS — Z992 Dependence on renal dialysis: Secondary | ICD-10-CM | POA: Diagnosis not present

## 2021-12-14 DIAGNOSIS — E8779 Other fluid overload: Secondary | ICD-10-CM | POA: Diagnosis not present

## 2021-12-14 DIAGNOSIS — N186 End stage renal disease: Secondary | ICD-10-CM | POA: Diagnosis not present

## 2021-12-14 DIAGNOSIS — N2581 Secondary hyperparathyroidism of renal origin: Secondary | ICD-10-CM | POA: Diagnosis not present

## 2021-12-14 DIAGNOSIS — Z992 Dependence on renal dialysis: Secondary | ICD-10-CM | POA: Diagnosis not present

## 2021-12-14 DIAGNOSIS — T82898A Other specified complication of vascular prosthetic devices, implants and grafts, initial encounter: Secondary | ICD-10-CM | POA: Diagnosis not present

## 2021-12-15 DIAGNOSIS — T82898A Other specified complication of vascular prosthetic devices, implants and grafts, initial encounter: Secondary | ICD-10-CM | POA: Diagnosis not present

## 2021-12-15 DIAGNOSIS — E8779 Other fluid overload: Secondary | ICD-10-CM | POA: Diagnosis not present

## 2021-12-15 DIAGNOSIS — Z992 Dependence on renal dialysis: Secondary | ICD-10-CM | POA: Diagnosis not present

## 2021-12-15 DIAGNOSIS — N2581 Secondary hyperparathyroidism of renal origin: Secondary | ICD-10-CM | POA: Diagnosis not present

## 2021-12-15 DIAGNOSIS — N186 End stage renal disease: Secondary | ICD-10-CM | POA: Diagnosis not present

## 2021-12-16 DIAGNOSIS — T82898A Other specified complication of vascular prosthetic devices, implants and grafts, initial encounter: Secondary | ICD-10-CM | POA: Diagnosis not present

## 2021-12-16 DIAGNOSIS — E8779 Other fluid overload: Secondary | ICD-10-CM | POA: Diagnosis not present

## 2021-12-16 DIAGNOSIS — N186 End stage renal disease: Secondary | ICD-10-CM | POA: Diagnosis not present

## 2021-12-16 DIAGNOSIS — N2581 Secondary hyperparathyroidism of renal origin: Secondary | ICD-10-CM | POA: Diagnosis not present

## 2021-12-16 DIAGNOSIS — Z992 Dependence on renal dialysis: Secondary | ICD-10-CM | POA: Diagnosis not present

## 2021-12-17 ENCOUNTER — Ambulatory Visit
Admission: RE | Admit: 2021-12-17 | Discharge: 2021-12-17 | Disposition: A | Discharge status: Home / Self Care | Payer: BC Managed Care – PPO | Source: Ambulatory Visit | Attending: Nephrology | Admitting: Nephrology

## 2021-12-17 DIAGNOSIS — D631 Anemia in chronic kidney disease: Secondary | ICD-10-CM | POA: Insufficient documentation

## 2021-12-17 DIAGNOSIS — N189 Chronic kidney disease, unspecified: Secondary | ICD-10-CM | POA: Insufficient documentation

## 2021-12-17 DIAGNOSIS — T82898A Other specified complication of vascular prosthetic devices, implants and grafts, initial encounter: Secondary | ICD-10-CM | POA: Diagnosis not present

## 2021-12-17 DIAGNOSIS — N186 End stage renal disease: Secondary | ICD-10-CM | POA: Diagnosis not present

## 2021-12-17 DIAGNOSIS — Z992 Dependence on renal dialysis: Secondary | ICD-10-CM | POA: Diagnosis not present

## 2021-12-17 DIAGNOSIS — N2581 Secondary hyperparathyroidism of renal origin: Secondary | ICD-10-CM | POA: Diagnosis not present

## 2021-12-17 DIAGNOSIS — E8779 Other fluid overload: Secondary | ICD-10-CM | POA: Diagnosis not present

## 2021-12-17 LAB — PREPARE RBC (CROSSMATCH)

## 2021-12-17 MED ORDER — SODIUM CHLORIDE FLUSH 0.9 % IV SOLN
INTRAVENOUS | Status: AC
  Filled 2021-12-17: qty 10

## 2021-12-18 DIAGNOSIS — T82898A Other specified complication of vascular prosthetic devices, implants and grafts, initial encounter: Secondary | ICD-10-CM | POA: Diagnosis not present

## 2021-12-18 DIAGNOSIS — E8779 Other fluid overload: Secondary | ICD-10-CM | POA: Diagnosis not present

## 2021-12-18 DIAGNOSIS — N2581 Secondary hyperparathyroidism of renal origin: Secondary | ICD-10-CM | POA: Diagnosis not present

## 2021-12-18 DIAGNOSIS — N186 End stage renal disease: Secondary | ICD-10-CM | POA: Diagnosis not present

## 2021-12-18 DIAGNOSIS — Z992 Dependence on renal dialysis: Secondary | ICD-10-CM | POA: Diagnosis not present

## 2021-12-18 LAB — TYPE AND SCREEN
ABO/RH(D): O POS
Antibody Screen: NEGATIVE
Donor AG Type: NEGATIVE
Unit division: 0

## 2021-12-18 LAB — BPAM RBC
Blood Product Expiration Date: 202305292359
ISSUE DATE / TIME: 202304251152
Unit Type and Rh: 9500

## 2021-12-19 DIAGNOSIS — N2581 Secondary hyperparathyroidism of renal origin: Secondary | ICD-10-CM | POA: Diagnosis not present

## 2021-12-19 DIAGNOSIS — T82898A Other specified complication of vascular prosthetic devices, implants and grafts, initial encounter: Secondary | ICD-10-CM | POA: Diagnosis not present

## 2021-12-19 DIAGNOSIS — Z992 Dependence on renal dialysis: Secondary | ICD-10-CM | POA: Diagnosis not present

## 2021-12-19 DIAGNOSIS — E8779 Other fluid overload: Secondary | ICD-10-CM | POA: Diagnosis not present

## 2021-12-19 DIAGNOSIS — N186 End stage renal disease: Secondary | ICD-10-CM | POA: Diagnosis not present

## 2021-12-20 DIAGNOSIS — N2581 Secondary hyperparathyroidism of renal origin: Secondary | ICD-10-CM | POA: Diagnosis not present

## 2021-12-20 DIAGNOSIS — N186 End stage renal disease: Secondary | ICD-10-CM | POA: Diagnosis not present

## 2021-12-20 DIAGNOSIS — T82898A Other specified complication of vascular prosthetic devices, implants and grafts, initial encounter: Secondary | ICD-10-CM | POA: Diagnosis not present

## 2021-12-20 DIAGNOSIS — Z992 Dependence on renal dialysis: Secondary | ICD-10-CM | POA: Diagnosis not present

## 2021-12-20 DIAGNOSIS — E8779 Other fluid overload: Secondary | ICD-10-CM | POA: Diagnosis not present

## 2021-12-21 DIAGNOSIS — N2581 Secondary hyperparathyroidism of renal origin: Secondary | ICD-10-CM | POA: Diagnosis not present

## 2021-12-21 DIAGNOSIS — N186 End stage renal disease: Secondary | ICD-10-CM | POA: Diagnosis not present

## 2021-12-21 DIAGNOSIS — E8779 Other fluid overload: Secondary | ICD-10-CM | POA: Diagnosis not present

## 2021-12-21 DIAGNOSIS — Z992 Dependence on renal dialysis: Secondary | ICD-10-CM | POA: Diagnosis not present

## 2021-12-21 DIAGNOSIS — T82898A Other specified complication of vascular prosthetic devices, implants and grafts, initial encounter: Secondary | ICD-10-CM | POA: Diagnosis not present

## 2021-12-22 DIAGNOSIS — T82898A Other specified complication of vascular prosthetic devices, implants and grafts, initial encounter: Secondary | ICD-10-CM | POA: Diagnosis not present

## 2021-12-22 DIAGNOSIS — Z992 Dependence on renal dialysis: Secondary | ICD-10-CM | POA: Diagnosis not present

## 2021-12-22 DIAGNOSIS — N2581 Secondary hyperparathyroidism of renal origin: Secondary | ICD-10-CM | POA: Diagnosis not present

## 2021-12-22 DIAGNOSIS — E8779 Other fluid overload: Secondary | ICD-10-CM | POA: Diagnosis not present

## 2021-12-22 DIAGNOSIS — N186 End stage renal disease: Secondary | ICD-10-CM | POA: Diagnosis not present

## 2021-12-23 DIAGNOSIS — N186 End stage renal disease: Secondary | ICD-10-CM | POA: Diagnosis not present

## 2021-12-23 DIAGNOSIS — Z992 Dependence on renal dialysis: Secondary | ICD-10-CM | POA: Diagnosis not present

## 2021-12-23 DIAGNOSIS — E8779 Other fluid overload: Secondary | ICD-10-CM | POA: Diagnosis not present

## 2021-12-24 DIAGNOSIS — E8779 Other fluid overload: Secondary | ICD-10-CM | POA: Diagnosis not present

## 2021-12-24 DIAGNOSIS — Z992 Dependence on renal dialysis: Secondary | ICD-10-CM | POA: Diagnosis not present

## 2021-12-24 DIAGNOSIS — N186 End stage renal disease: Secondary | ICD-10-CM | POA: Diagnosis not present

## 2021-12-25 DIAGNOSIS — Z992 Dependence on renal dialysis: Secondary | ICD-10-CM | POA: Diagnosis not present

## 2021-12-25 DIAGNOSIS — N186 End stage renal disease: Secondary | ICD-10-CM | POA: Diagnosis not present

## 2021-12-25 DIAGNOSIS — E8779 Other fluid overload: Secondary | ICD-10-CM | POA: Diagnosis not present

## 2021-12-26 ENCOUNTER — Ambulatory Visit (INDEPENDENT_AMBULATORY_CARE_PROVIDER_SITE_OTHER): Payer: BC Managed Care – PPO

## 2021-12-26 ENCOUNTER — Encounter (INDEPENDENT_AMBULATORY_CARE_PROVIDER_SITE_OTHER): Payer: Self-pay | Admitting: Nurse Practitioner

## 2021-12-26 ENCOUNTER — Ambulatory Visit (INDEPENDENT_AMBULATORY_CARE_PROVIDER_SITE_OTHER): Payer: BC Managed Care – PPO | Admitting: Nurse Practitioner

## 2021-12-26 VITALS — BP 137/87 | HR 90 | Resp 16 | Ht 67.0 in | Wt 200.0 lb

## 2021-12-26 DIAGNOSIS — I10 Essential (primary) hypertension: Secondary | ICD-10-CM | POA: Diagnosis not present

## 2021-12-26 DIAGNOSIS — N185 Chronic kidney disease, stage 5: Secondary | ICD-10-CM

## 2021-12-26 DIAGNOSIS — E8779 Other fluid overload: Secondary | ICD-10-CM | POA: Diagnosis not present

## 2021-12-26 DIAGNOSIS — N055 Unspecified nephritic syndrome with diffuse mesangiocapillary glomerulonephritis: Secondary | ICD-10-CM | POA: Insufficient documentation

## 2021-12-26 DIAGNOSIS — N186 End stage renal disease: Secondary | ICD-10-CM

## 2021-12-26 DIAGNOSIS — Z992 Dependence on renal dialysis: Secondary | ICD-10-CM | POA: Diagnosis not present

## 2021-12-26 DIAGNOSIS — N184 Chronic kidney disease, stage 4 (severe): Secondary | ICD-10-CM | POA: Insufficient documentation

## 2021-12-27 DIAGNOSIS — E8779 Other fluid overload: Secondary | ICD-10-CM | POA: Diagnosis not present

## 2021-12-27 DIAGNOSIS — Z992 Dependence on renal dialysis: Secondary | ICD-10-CM | POA: Diagnosis not present

## 2021-12-27 DIAGNOSIS — N186 End stage renal disease: Secondary | ICD-10-CM | POA: Diagnosis not present

## 2021-12-28 DIAGNOSIS — N186 End stage renal disease: Secondary | ICD-10-CM | POA: Diagnosis not present

## 2021-12-28 DIAGNOSIS — Z992 Dependence on renal dialysis: Secondary | ICD-10-CM | POA: Diagnosis not present

## 2021-12-28 DIAGNOSIS — E8779 Other fluid overload: Secondary | ICD-10-CM | POA: Diagnosis not present

## 2021-12-29 DIAGNOSIS — N186 End stage renal disease: Secondary | ICD-10-CM | POA: Diagnosis not present

## 2021-12-29 DIAGNOSIS — Z992 Dependence on renal dialysis: Secondary | ICD-10-CM | POA: Diagnosis not present

## 2021-12-29 DIAGNOSIS — E8779 Other fluid overload: Secondary | ICD-10-CM | POA: Diagnosis not present

## 2021-12-30 DIAGNOSIS — E8779 Other fluid overload: Secondary | ICD-10-CM | POA: Diagnosis not present

## 2021-12-30 DIAGNOSIS — N186 End stage renal disease: Secondary | ICD-10-CM | POA: Diagnosis not present

## 2021-12-30 DIAGNOSIS — Z992 Dependence on renal dialysis: Secondary | ICD-10-CM | POA: Diagnosis not present

## 2021-12-31 ENCOUNTER — Telehealth (INDEPENDENT_AMBULATORY_CARE_PROVIDER_SITE_OTHER): Payer: Self-pay

## 2021-12-31 ENCOUNTER — Encounter (INDEPENDENT_AMBULATORY_CARE_PROVIDER_SITE_OTHER): Payer: Self-pay | Admitting: Nurse Practitioner

## 2021-12-31 DIAGNOSIS — N186 End stage renal disease: Secondary | ICD-10-CM | POA: Diagnosis not present

## 2021-12-31 DIAGNOSIS — Z992 Dependence on renal dialysis: Secondary | ICD-10-CM | POA: Diagnosis not present

## 2021-12-31 DIAGNOSIS — E8779 Other fluid overload: Secondary | ICD-10-CM | POA: Diagnosis not present

## 2021-12-31 NOTE — Progress Notes (Signed)
? ?Subjective:  ? ? Patient ID: Holly [name], female    DOB: September 30, 1985, 36 y.o.   MRN: [id number] ?Chief Complaint  ?Patient presents with  ? New Patient (Initial Visit)  ?  Ref Singh vein mapping consult AVF placement  ? ? ?Holly [name] is a 36 year old female who presents today for evaluation for permanent dialysis access.the patient was previously on peritoneal dialysis but she is recently transition to hemodialysis.  She currently is doing hemodialysis at home.  She is also maintained via PermCath.  ? ?The patient is right-handed. ? ?The patient has been considering the various methods of dialysis and wishes to proceed with hemodialysis and therefore creation of AV access. ? ?No recent shortening of the patient's walking distance or new symptoms consistent with claudication.  No history of rest pain symptoms. No new ulcers or wounds of the lower extremities have occurred. ? ?The patient denies amaurosis fugax or recent TIA symptoms. There are no recent neurological changes documented. ?There is no history of DVT, PE or superficial thrombophlebitis documented. ?No recent episodes of angina or shortness of breath documented.   ? ?Today noninvasive studies show adequate evidence for creation of a left radiocephalic AV fistula. ? ? ?Review of Systems  ?All other systems reviewed and are negative. ? ?   ?Objective:  ? Physical Exam ?Vitals reviewed.  ?HENT:  ?   Head: Normocephalic.  ?Cardiovascular:  ?   Rate and Rhythm: Normal rate.  ?   Pulses:     ?     Radial pulses are 1+ on the right side and 1+ on the left side.  ?Pulmonary:  ?   Effort: Pulmonary effort is normal.  ?Skin: ?   General: Skin is warm and dry.  ?Neurological:  ?   Mental Status: She is alert and oriented to person, place, and time.  ?Psychiatric:     ?   Mood and Affect: Mood normal.     ?   Behavior: Behavior normal.     ?   Thought Content: Thought content normal.     ?   Judgment: Judgment normal.  ? ? ?BP 137/87 (BP Location: Right Arm)    Pulse 90   Resp 16   Ht '5\' 7"'$  (1.702 m)   Wt 200 lb (90.7 kg)   BMI 31.32 kg/m?  ? ?Past Medical History:  ?Diagnosis Date  ? Cancer [facility])   ? Dialysis patient [facility])   ? Hypertension   ? Renal disorder   ? Thyroid disease   ? ? ?Social History  ? ?Socioeconomic History  ? Marital status: Married  ?  Spouse name: Not on file  ? Number of children: Not on file  ? Years of education: Not on file  ? Highest education level: Not on file  ?Occupational History  ? Not on file  ?Tobacco Use  ? Smoking status: Some Days  ?  Types: Cigarettes  ? Smokeless tobacco: Never  ?Vaping Use  ? Vaping Use: Never used  ?Substance and Sexual Activity  ? Alcohol use: Never  ? Drug use: Never  ? Sexual activity: Not on file  ?Other Topics Concern  ? Not on file  ?Social History Narrative  ? Not on file  ? ?Social Determinants of Health  ? ?Financial Resource Strain: Not on file  ?Food Insecurity: Not on file  ?Transportation Needs: Not on file  ?Physical Activity: Not on file  ?Stress: Not on file  ?Social Connections: Not on file  ?  Intimate Partner Violence: Not on file  ? ? ?Past Surgical History:  ?Procedure Laterality Date  ? ABDOMINAL SURGERY    ? RENAL BIOPSY    ? x 2  ? TUMOR REMOVAL    ? right face  ? ? ?Family History  ?Problem Relation Age of Onset  ? Multiple sclerosis Mother   ? Cancer Father   ? COPD Father   ? ? ?Allergies  ?Allergen Reactions  ? Rituximab Anaphylaxis  ? Bupropion Other (See Comments)  ?  Caused seizures  ? Ciprofloxacin Other (See Comments)  ?  Caused Seizures   ? Levetiracetam Other (See Comments)  ?  Medical Record H&P (unknown reaction)  ? Oxycodone-Acetaminophen Nausea Only  ? Tramadol Other (See Comments)  ?  Caused seizures  ? ? ? ?  Latest Ref Rng & Units 11/09/2021  ?  7:49 AM 11/08/2021  ?  6:36 AM 10/23/2021  ?  1:44 PM  ?CBC  ?WBC 4.0 - 10.5 K/uL 8.6   8.0     ?Hemoglobin 12.0 - 15.0 g/dL 9.0   6.5  C 6.6    ?Hematocrit 36.0 - 46.0 % 25.0   19.4   19.7    ?Platelets 150 - 400 K/uL 171   219      ?  ?C Corrected result  ? ? ? ? ?CMP  ?   ?Component Value Date/Time  ? NA 136 11/08/2021 0636  ? K 4.9 11/08/2021 0636  ? CL 99 11/08/2021 0636  ? CO2 26 11/08/2021 0636  ? GLUCOSE 90 11/08/2021 0636  ? BUN 31 (H) 11/08/2021 0636  ? CREATININE 7.13 (H) 11/08/2021 0636  ? CALCIUM 9.2 11/08/2021 0636  ? PROT 7.3 06/22/2021 1443  ? ALBUMIN 3.9 06/22/2021 1443  ? AST 13 (L) 06/22/2021 1443  ? ALT 10 06/22/2021 1443  ? ALKPHOS 101 06/22/2021 1443  ? BILITOT 0.7 06/22/2021 1443  ? GFRNONAA 7 (L) 11/08/2021 0636  ? GFRAA 3 (L) 02/19/2020 2302  ? ? ? ?No results found. ? ?   ?Assessment & Plan:  ? ?1. ESRD on dialysis [facility]) ?Recommend: ? ?At this time the patient does not have appropriate extremity access for dialysis ? ?Patient should have a left radiocephalic AV fistula created. ? ?The risks, benefits and alternative therapies were reviewed in detail with the patient.  All questions were answered.  The patient agrees to proceed with surgery.  ? ?The patient will follow up with me in the office after the surgery.  ? ?2. Essential hypertension ?Currently blood pressure well controlled.  No additional medications necessary. ? ? ?Current Outpatient Medications on File Prior to Visit  ?Medication Sig Dispense Refill  ? acetaminophen (TYLENOL) 500 MG tablet Take 1,000 mg by mouth every 6 (six) hours as needed for headache (pain).    ? acyclovir (ZOVIRAX) 200 MG capsule Take 200 mg by mouth 2 (two) times daily as needed (breakouts).    ? amLODipine (NORVASC) 5 MG tablet Take 5 mg by mouth at bedtime.    ? carvedilol (COREG) 12.5 MG tablet Take 12.5 mg by mouth 2 (two) times daily with a meal.    ? cinacalcet (SENSIPAR) 60 MG tablet Take 60 mg by mouth at bedtime.    ? diclofenac Sodium (VOLTAREN) 1 % GEL Apply 1 application. topically 4 (four) times daily as needed (pain).    ? lanthanum (FOSRENOL) 1000 MG chewable tablet Chew 1,000 mg by mouth 3 (three) times daily with meals.    ?  levothyroxine (SYNTHROID) 200 MCG tablet  Take 200 mcg by mouth daily before breakfast.    ? losartan (COZAAR) 100 MG tablet Take 100 mg by mouth at bedtime.    ? ?No current facility-administered medications on file prior to visit.  ? ? ?There are no Patient Instructions on file for this visit. ?No follow-ups on file. ? ? ?Kris Hartmann, NP ? ? ?

## 2021-12-31 NOTE — H&P (View-Only) (Signed)
Subjective:    Patient ID: Holly [name], female    DOB: 08-Sep-1985, 36 y.o.   MRN: [id number] Chief Complaint  Patient presents with   New Patient (Initial Visit)    Ref Candiss Norse vein mapping consult AVF placement    Holly [name] is a 36 year old female who presents today for evaluation for permanent dialysis access.the patient was previously on peritoneal dialysis but she is recently transition to hemodialysis.  She currently is doing hemodialysis at home.  She is also maintained via PermCath.   The patient is right-handed.  The patient has been considering the various methods of dialysis and wishes to proceed with hemodialysis and therefore creation of AV access.  No recent shortening of the patient's walking distance or new symptoms consistent with claudication.  No history of rest pain symptoms. No new ulcers or wounds of the lower extremities have occurred.  The patient denies amaurosis fugax or recent TIA symptoms. There are no recent neurological changes documented. There is no history of DVT, PE or superficial thrombophlebitis documented. No recent episodes of angina or shortness of breath documented.    Today noninvasive studies show adequate evidence for creation of a left radiocephalic AV fistula.   Review of Systems  All other systems reviewed and are negative.     Objective:   Physical Exam Vitals reviewed.  HENT:     Head: Normocephalic.  Cardiovascular:     Rate and Rhythm: Normal rate.     Pulses:          Radial pulses are 1+ on the right side and 1+ on the left side.  Pulmonary:     Effort: Pulmonary effort is normal.  Skin:    General: Skin is warm and dry.  Neurological:     Mental Status: She is alert and oriented to person, place, and time.  Psychiatric:        Mood and Affect: Mood normal.        Behavior: Behavior normal.        Thought Content: Thought content normal.        Judgment: Judgment normal.    BP 137/87 (BP Location: Right Arm)    Pulse 90   Resp 16   Ht '5\' 7"'$  (1.702 m)   Wt 200 lb (90.7 kg)   BMI 31.32 kg/m   Past Medical History:  Diagnosis Date   Cancer ([place])    Dialysis patient ([place])    Hypertension    Renal disorder    Thyroid disease     Social History   Socioeconomic History   Marital status: Married    Spouse name: Not on file   Number of children: Not on file   Years of education: Not on file   Highest education level: Not on file  Occupational History   Not on file  Tobacco Use   Smoking status: Some Days    Types: Cigarettes   Smokeless tobacco: Never  Vaping Use   Vaping Use: Never used  Substance and Sexual Activity   Alcohol use: Never   Drug use: Never   Sexual activity: Not on file  Other Topics Concern   Not on file  Social History Narrative   Not on file   Social Determinants of Health   Financial Resource Strain: Not on file  Food Insecurity: Not on file  Transportation Needs: Not on file  Physical Activity: Not on file  Stress: Not on file  Social Connections: Not on file  Intimate Partner Violence: Not on file    Past Surgical History:  Procedure Laterality Date   ABDOMINAL SURGERY     RENAL BIOPSY     x 2   TUMOR REMOVAL     right face    Family History  Problem Relation Age of Onset   Multiple sclerosis Mother    Cancer Father    COPD Father     Allergies  Allergen Reactions   Rituximab Anaphylaxis   Bupropion Other (See Comments)    Caused seizures   Ciprofloxacin Other (See Comments)    Caused Seizures    Levetiracetam Other (See Comments)    Medical Record H&P (unknown reaction)   Oxycodone-Acetaminophen Nausea Only   Tramadol Other (See Comments)    Caused seizures       Latest Ref Rng & Units 11/09/2021    7:49 AM 11/08/2021    6:36 AM 10/23/2021    1:44 PM  CBC  WBC 4.0 - 10.5 K/uL 8.6   8.0     Hemoglobin 12.0 - 15.0 g/dL 9.0   6.5  C 6.6    Hematocrit 36.0 - 46.0 % 25.0   19.4   19.7    Platelets 150 - 400 K/uL 171   219        C Corrected result      CMP     Component Value Date/Time   NA 136 11/08/2021 0636   K 4.9 11/08/2021 0636   CL 99 11/08/2021 0636   CO2 26 11/08/2021 0636   GLUCOSE 90 11/08/2021 0636   BUN 31 (H) 11/08/2021 0636   CREATININE 7.13 (H) 11/08/2021 0636   CALCIUM 9.2 11/08/2021 0636   PROT 7.3 06/22/2021 1443   ALBUMIN 3.9 06/22/2021 1443   AST 13 (L) 06/22/2021 1443   ALT 10 06/22/2021 1443   ALKPHOS 101 06/22/2021 1443   BILITOT 0.7 06/22/2021 1443   GFRNONAA 7 (L) 11/08/2021 0636   GFRAA 3 (L) 02/19/2020 2302     No results found.     Assessment & Plan:   1. ESRD on dialysis [facility]) Recommend:  At this time the patient does not have appropriate extremity access for dialysis  Patient should have a left radiocephalic AV fistula created.  The risks, benefits and alternative therapies were reviewed in detail with the patient.  All questions were answered.  The patient agrees to proceed with surgery.   The patient will follow up with me in the office after the surgery.   2. Essential hypertension Currently blood pressure well controlled.  No additional medications necessary.   Current Outpatient Medications on File Prior to Visit  Medication Sig Dispense Refill   acetaminophen (TYLENOL) 500 MG tablet Take 1,000 mg by mouth every 6 (six) hours as needed for headache (pain).     acyclovir (ZOVIRAX) 200 MG capsule Take 200 mg by mouth 2 (two) times daily as needed (breakouts).     amLODipine (NORVASC) 5 MG tablet Take 5 mg by mouth at bedtime.     carvedilol (COREG) 12.5 MG tablet Take 12.5 mg by mouth 2 (two) times daily with a meal.     cinacalcet (SENSIPAR) 60 MG tablet Take 60 mg by mouth at bedtime.     diclofenac Sodium (VOLTAREN) 1 % GEL Apply 1 application. topically 4 (four) times daily as needed (pain).     lanthanum (FOSRENOL) 1000 MG chewable tablet Chew 1,000 mg by mouth 3 (three) times daily with meals.  levothyroxine (SYNTHROID) 200 MCG tablet  Take 200 mcg by mouth daily before breakfast.     losartan (COZAAR) 100 MG tablet Take 100 mg by mouth at bedtime.     No current facility-administered medications on file prior to visit.    There are no Patient Instructions on file for this visit. No follow-ups on file.   Kris Hartmann, NP

## 2021-12-31 NOTE — Telephone Encounter (Signed)
Spoke with the patient and she is scheduled with Dr. Delana Meyer for a left radial cephalic fistula on 27/61/47 at the [facility]. Pre-op phone call is scheduled on 01/14/22 between 8-1 pm. Pre-surgical instructions were discussed and will be mailed. ?

## 2022-01-01 DIAGNOSIS — Z992 Dependence on renal dialysis: Secondary | ICD-10-CM | POA: Diagnosis not present

## 2022-01-01 DIAGNOSIS — N186 End stage renal disease: Secondary | ICD-10-CM | POA: Diagnosis not present

## 2022-01-01 DIAGNOSIS — E8779 Other fluid overload: Secondary | ICD-10-CM | POA: Diagnosis not present

## 2022-01-02 DIAGNOSIS — N186 End stage renal disease: Secondary | ICD-10-CM | POA: Diagnosis not present

## 2022-01-02 DIAGNOSIS — E8779 Other fluid overload: Secondary | ICD-10-CM | POA: Diagnosis not present

## 2022-01-02 DIAGNOSIS — N921 Excessive and frequent menstruation with irregular cycle: Secondary | ICD-10-CM | POA: Diagnosis not present

## 2022-01-02 DIAGNOSIS — Z992 Dependence on renal dialysis: Secondary | ICD-10-CM | POA: Diagnosis not present

## 2022-01-02 DIAGNOSIS — T8332XA Displacement of intrauterine contraceptive device, initial encounter: Secondary | ICD-10-CM | POA: Diagnosis not present

## 2022-01-03 DIAGNOSIS — N186 End stage renal disease: Secondary | ICD-10-CM | POA: Diagnosis not present

## 2022-01-03 DIAGNOSIS — Z992 Dependence on renal dialysis: Secondary | ICD-10-CM | POA: Diagnosis not present

## 2022-01-03 DIAGNOSIS — E8779 Other fluid overload: Secondary | ICD-10-CM | POA: Diagnosis not present

## 2022-01-04 DIAGNOSIS — Z992 Dependence on renal dialysis: Secondary | ICD-10-CM | POA: Diagnosis not present

## 2022-01-04 DIAGNOSIS — N186 End stage renal disease: Secondary | ICD-10-CM | POA: Diagnosis not present

## 2022-01-04 DIAGNOSIS — E8779 Other fluid overload: Secondary | ICD-10-CM | POA: Diagnosis not present

## 2022-01-05 DIAGNOSIS — E8779 Other fluid overload: Secondary | ICD-10-CM | POA: Diagnosis not present

## 2022-01-05 DIAGNOSIS — N186 End stage renal disease: Secondary | ICD-10-CM | POA: Diagnosis not present

## 2022-01-05 DIAGNOSIS — Z992 Dependence on renal dialysis: Secondary | ICD-10-CM | POA: Diagnosis not present

## 2022-01-06 DIAGNOSIS — E8779 Other fluid overload: Secondary | ICD-10-CM | POA: Diagnosis not present

## 2022-01-06 DIAGNOSIS — N186 End stage renal disease: Secondary | ICD-10-CM | POA: Diagnosis not present

## 2022-01-06 DIAGNOSIS — Z992 Dependence on renal dialysis: Secondary | ICD-10-CM | POA: Diagnosis not present

## 2022-01-08 DIAGNOSIS — N186 End stage renal disease: Secondary | ICD-10-CM | POA: Diagnosis not present

## 2022-01-08 DIAGNOSIS — E8779 Other fluid overload: Secondary | ICD-10-CM | POA: Diagnosis not present

## 2022-01-08 DIAGNOSIS — Z992 Dependence on renal dialysis: Secondary | ICD-10-CM | POA: Diagnosis not present

## 2022-01-09 ENCOUNTER — Telehealth (INDEPENDENT_AMBULATORY_CARE_PROVIDER_SITE_OTHER): Payer: Self-pay

## 2022-01-09 DIAGNOSIS — Z992 Dependence on renal dialysis: Secondary | ICD-10-CM | POA: Diagnosis not present

## 2022-01-09 DIAGNOSIS — E8779 Other fluid overload: Secondary | ICD-10-CM | POA: Diagnosis not present

## 2022-01-09 DIAGNOSIS — N186 End stage renal disease: Secondary | ICD-10-CM | POA: Diagnosis not present

## 2022-01-09 NOTE — Telephone Encounter (Signed)
Spoke with the patient and she has been rescheduled with Dr. Delana Meyer to have her left radialcephalic AVF on 95/28/41 from 01/22/22 with Dr. Delana Meyer. Pre-surgical instructions will be mailed.

## 2022-01-10 DIAGNOSIS — Z992 Dependence on renal dialysis: Secondary | ICD-10-CM | POA: Diagnosis not present

## 2022-01-10 DIAGNOSIS — N186 End stage renal disease: Secondary | ICD-10-CM | POA: Diagnosis not present

## 2022-01-10 DIAGNOSIS — E8779 Other fluid overload: Secondary | ICD-10-CM | POA: Diagnosis not present

## 2022-01-11 DIAGNOSIS — Z992 Dependence on renal dialysis: Secondary | ICD-10-CM | POA: Diagnosis not present

## 2022-01-11 DIAGNOSIS — E8779 Other fluid overload: Secondary | ICD-10-CM | POA: Diagnosis not present

## 2022-01-11 DIAGNOSIS — N186 End stage renal disease: Secondary | ICD-10-CM | POA: Diagnosis not present

## 2022-01-12 DIAGNOSIS — E8779 Other fluid overload: Secondary | ICD-10-CM | POA: Diagnosis not present

## 2022-01-12 DIAGNOSIS — Z992 Dependence on renal dialysis: Secondary | ICD-10-CM | POA: Diagnosis not present

## 2022-01-12 DIAGNOSIS — N186 End stage renal disease: Secondary | ICD-10-CM | POA: Diagnosis not present

## 2022-01-13 DIAGNOSIS — N186 End stage renal disease: Secondary | ICD-10-CM | POA: Diagnosis not present

## 2022-01-13 DIAGNOSIS — E8779 Other fluid overload: Secondary | ICD-10-CM | POA: Diagnosis not present

## 2022-01-13 DIAGNOSIS — Z992 Dependence on renal dialysis: Secondary | ICD-10-CM | POA: Diagnosis not present

## 2022-01-14 ENCOUNTER — Encounter
Admission: RE | Admit: 2022-01-14 | Discharge: 2022-01-14 | Disposition: A | Discharge status: Home / Self Care | Payer: BC Managed Care – PPO | Source: Ambulatory Visit | Attending: Vascular Surgery | Admitting: Vascular Surgery

## 2022-01-14 ENCOUNTER — Other Ambulatory Visit (INDEPENDENT_AMBULATORY_CARE_PROVIDER_SITE_OTHER): Payer: Self-pay | Admitting: Nurse Practitioner

## 2022-01-14 DIAGNOSIS — I120 Hypertensive chronic kidney disease with stage 5 chronic kidney disease or end stage renal disease: Secondary | ICD-10-CM | POA: Diagnosis not present

## 2022-01-14 DIAGNOSIS — Z992 Dependence on renal dialysis: Secondary | ICD-10-CM

## 2022-01-14 DIAGNOSIS — F17210 Nicotine dependence, cigarettes, uncomplicated: Secondary | ICD-10-CM | POA: Diagnosis not present

## 2022-01-14 DIAGNOSIS — Z01812 Encounter for preprocedural laboratory examination: Secondary | ICD-10-CM | POA: Insufficient documentation

## 2022-01-14 DIAGNOSIS — N186 End stage renal disease: Secondary | ICD-10-CM

## 2022-01-14 DIAGNOSIS — R634 Abnormal weight loss: Secondary | ICD-10-CM | POA: Diagnosis not present

## 2022-01-14 DIAGNOSIS — K625 Hemorrhage of anus and rectum: Secondary | ICD-10-CM | POA: Diagnosis not present

## 2022-01-14 DIAGNOSIS — R1084 Generalized abdominal pain: Secondary | ICD-10-CM | POA: Diagnosis not present

## 2022-01-14 DIAGNOSIS — N184 Chronic kidney disease, stage 4 (severe): Secondary | ICD-10-CM

## 2022-01-14 DIAGNOSIS — D500 Iron deficiency anemia secondary to blood loss (chronic): Secondary | ICD-10-CM | POA: Diagnosis not present

## 2022-01-14 DIAGNOSIS — E8779 Other fluid overload: Secondary | ICD-10-CM | POA: Diagnosis not present

## 2022-01-14 DIAGNOSIS — Z01810 Encounter for preprocedural cardiovascular examination: Secondary | ICD-10-CM | POA: Diagnosis not present

## 2022-01-14 HISTORY — DX: Anemia, unspecified: D64.9

## 2022-01-14 HISTORY — DX: Other complications of anesthesia, initial encounter: T88.59XA

## 2022-01-14 HISTORY — DX: Depression, unspecified: F32.A

## 2022-01-14 HISTORY — DX: Hypothyroidism, unspecified: E03.9

## 2022-01-14 HISTORY — DX: Personal history of urinary calculi: Z87.442

## 2022-01-14 HISTORY — DX: Pneumonia, unspecified organism: J18.9

## 2022-01-14 HISTORY — DX: Unspecified osteoarthritis, unspecified site: M19.90

## 2022-01-14 HISTORY — DX: Dyspnea, unspecified: R06.00

## 2022-01-14 HISTORY — DX: Anxiety disorder, unspecified: F41.9

## 2022-01-14 LAB — CBC
HCT: 19.5 % — ABNORMAL LOW (ref 36.0–46.0)
Hemoglobin: 6.9 g/dL — ABNORMAL LOW (ref 12.0–15.0)
MCH: 31.7 pg (ref 26.0–34.0)
MCHC: 35.4 g/dL (ref 30.0–36.0)
MCV: 89.4 fL (ref 80.0–100.0)
Platelets: 201 10*3/uL (ref 150–400)
RBC: 2.18 MIL/uL — ABNORMAL LOW (ref 3.87–5.11)
RDW: 13.2 % (ref 11.5–15.5)
WBC: 6.1 10*3/uL (ref 4.0–10.5)
nRBC: 0 % (ref 0.0–0.2)

## 2022-01-14 LAB — BASIC METABOLIC PANEL
Anion gap: 15 (ref 5–15)
BUN: 47 mg/dL — ABNORMAL HIGH (ref 6–20)
CO2: 26 mmol/L (ref 22–32)
Calcium: 9.6 mg/dL (ref 8.9–10.3)
Chloride: 88 mmol/L — ABNORMAL LOW (ref 98–111)
Creatinine, Ser: 9.71 mg/dL — ABNORMAL HIGH (ref 0.44–1.00)
GFR, Estimated: 5 mL/min — ABNORMAL LOW (ref >60)
Glucose, Bld: 70 mg/dL (ref 70–99)
Potassium: 5.1 mmol/L (ref 3.5–5.1)
Sodium: 129 mmol/L — ABNORMAL LOW (ref 135–145)

## 2022-01-14 NOTE — Progress Notes (Signed)
  [facility] Perioperative Services: Pre-Admission/Anesthesia Testing  Abnormal Lab Notification   Date: 01/14/22  Name: Holly [name] MRN:   [id number]  Re: Abnormal labs noted during PAT appointment   Notified:  Provider Name Provider Role Notification Mode  Schnier, Dolores Lory, MD Vascular Surgery Routed and/or faxed via Jones Skene, NP-C Vascular Surgery APP Routed and/or faxed via [place] and Notes:  ABNORMAL LAB VALUE(S): Lab Results  Component Value Date   HGB 6.9 (L) 01/14/2022   HCT 19.5 (L) 01/14/2022   Paralee Cancel is scheduled for a ARTERIOVENOUS (AV) FISTULA CREATION ( RADIAL CEPHALIC) (Left) on 55/37/4827. Patient with a history of significant anemia of chronic renal disease. She has multiple blood transfusions (5 units PRBCs total) over the course of the past few months; 2 units on 10/23/2021, 2 units 11/08/2021, and 1 unit on 12/17/2021. Patient at increased risk for perioperative complications at current hemoglobin level. However, given her advanced renal failure, adding to her fluid volume also presents challenges. I have discussed case with attending anesthesiologist on call Erenest Rasher, MD) who agrees that pre-procedural transfusion is indicated/warranted in this case to ensure safe procedural/anesthetic course.   Case is fast approaching. Options are have her come in to [facility] tomorrow or Thursday for the blood, or transfuse her on the day of her procedure. Of note, transfusing on the day of procedure will likely be difficult in light of surgeon's schedule at this point. I would anticipate that blood would need to be transfused over at least 4 hours to prevent issues with volume overload. I am not sure Dr. Nino Parsley case schedule for that day can be altered to accommodate this need, therefore I am sending copy of this note to office for review and consideration.   Honor Loh, MSN, APRN, FNP-C, CEN [facility]  Peri-operative Services Nurse Practitioner Phone: [phone] Fax: [phone] 01/14/22 2:45 PM

## 2022-01-14 NOTE — Patient Instructions (Addendum)
Your procedure is scheduled on: 01/17/22 - Friday Report to the Registration Desk on the 1st floor of the [facility]. To find out your arrival time, please call [phone] between 1PM - 3PM on: 01/16/22 - Thursday If your arrival time is 6:00 am, do not arrive prior to that time as the [facility] entrance doors do not open until 6:00 am.  REMEMBER: Instructions that are not followed completely may result in serious medical risk, up to and including death; or upon the discretion of your surgeon and anesthesiologist your surgery may need to be rescheduled.  Do not eat food or drink any liquids after midnight the night before surgery.  No gum chewing, lozengers or hard candies.  TAKE THESE MEDICATIONS THE MORNING OF SURGERY WITH A SIP OF WATER:  - carvedilol (COREG) 12.5 MG tablet - levothyroxine (SYNTHROID) 200 MCG tablet  One week prior to surgery: Stop Anti-inflammatories (NSAIDS) such as Advil, Aleve, Ibuprofen, Motrin, Naproxen, Naprosyn and Aspirin based products such as Excedrin, Goodys Powder, BC Powder.  Stop ANY OVER THE COUNTER supplements until after surgery.  You may however, continue to take Tylenol if needed for pain up until the day of surgery.  No Alcohol for 24 hours before or after surgery.  No Smoking including e-cigarettes for 24 hours prior to surgery.  No chewable tobacco products for at least 6 hours prior to surgery.  No nicotine patches on the day of surgery.  Do not use any "recreational" drugs for at least a week prior to your surgery.  Please be advised that the combination of cocaine and anesthesia may have negative outcomes, up to and including death. If you test positive for cocaine, your surgery will be cancelled.  On the morning of surgery brush your teeth with toothpaste and water, you may rinse your mouth with mouthwash if you wish. Do not swallow any toothpaste or mouthwash.  Use CHG Soap or wipes as directed on instruction sheet.  Do  not wear jewelry, make-up, hairpins, clips or nail polish.  Do not wear lotions, powders, or perfumes.   Do not shave body from the neck down 48 hours prior to surgery just in case you cut yourself which could leave a site for infection.  Also, freshly shaved skin may become irritated if using the CHG soap.  Contact lenses, hearing aids and dentures may not be worn into surgery.  Do not bring valuables to the hospital. [facility] is not responsible for any missing/lost belongings or valuables.   Notify your doctor if there is any change in your medical condition (cold, fever, infection).  Wear comfortable clothing (specific to your surgery type) to the hospital.  After surgery, you can help prevent lung complications by doing breathing exercises.  Take deep breaths and cough every 1-2 hours. Your doctor may order a device called an Incentive Spirometer to help you take deep breaths. When coughing or sneezing, hold a pillow firmly against your incision with both hands. This is called "splinting." Doing this helps protect your incision. It also decreases belly discomfort.  If you are being admitted to the hospital overnight, leave your suitcase in the car. After surgery it may be brought to your room.  If you are being discharged the day of surgery, you will not be allowed to drive home. You will need a responsible adult (18 years or older) to drive you home and stay with you that night.   If you are taking public transportation, you will need to  have a responsible adult (18 years or older) with you. Please confirm with your physician that it is acceptable to use public transportation.   Please call the [facility] Dept. at [phone] if you have any questions about these instructions.  Surgery Visitation Policy:  Patients undergoing a surgery or procedure may have two family members or support persons with them as long as the person is not COVID-19 positive or  experiencing its symptoms.   Inpatient Visitation:    Visiting hours are 7 a.m. to 8 p.m. Up to four visitors are allowed at one time in a patient room, including children. The visitors may rotate out with other people during the day. One designated support person (adult) may remain overnight.

## 2022-01-15 DIAGNOSIS — N186 End stage renal disease: Secondary | ICD-10-CM | POA: Diagnosis not present

## 2022-01-15 DIAGNOSIS — Z992 Dependence on renal dialysis: Secondary | ICD-10-CM | POA: Diagnosis not present

## 2022-01-15 DIAGNOSIS — E8779 Other fluid overload: Secondary | ICD-10-CM | POA: Diagnosis not present

## 2022-01-16 ENCOUNTER — Telehealth (INDEPENDENT_AMBULATORY_CARE_PROVIDER_SITE_OTHER): Payer: Self-pay | Admitting: Nurse Practitioner

## 2022-01-16 ENCOUNTER — Other Ambulatory Visit (INDEPENDENT_AMBULATORY_CARE_PROVIDER_SITE_OTHER): Payer: Self-pay | Admitting: Nurse Practitioner

## 2022-01-16 ENCOUNTER — Other Ambulatory Visit: Payer: Self-pay | Admitting: Nephrology

## 2022-01-16 DIAGNOSIS — E8779 Other fluid overload: Secondary | ICD-10-CM | POA: Diagnosis not present

## 2022-01-16 DIAGNOSIS — D631 Anemia in chronic kidney disease: Secondary | ICD-10-CM

## 2022-01-16 DIAGNOSIS — Z992 Dependence on renal dialysis: Secondary | ICD-10-CM | POA: Diagnosis not present

## 2022-01-16 DIAGNOSIS — N186 End stage renal disease: Secondary | ICD-10-CM | POA: Diagnosis not present

## 2022-01-16 LAB — PREPARE RBC (CROSSMATCH)

## 2022-01-16 MED ORDER — CHLORHEXIDINE GLUCONATE CLOTH 2 % EX PADS: 6.0000 | MEDICATED_PAD | Freq: Once | CUTANEOUS | Status: DC

## 2022-01-16 MED ORDER — ORAL CARE MOUTH RINSE: 15.0000 mL | Freq: Once | OROMUCOSAL | Status: DC

## 2022-01-16 MED ORDER — CEFAZOLIN SODIUM-DEXTROSE 2-4 GM/100ML-% IV SOLN
2.0000 g | INTRAVENOUS | Status: AC
  Administered 2022-01-17: 2 g via INTRAVENOUS

## 2022-01-16 MED ORDER — CHLORHEXIDINE GLUCONATE 0.12 % MT SOLN: 15.0000 mL | Freq: Once | OROMUCOSAL | Status: DC

## 2022-01-16 MED ORDER — SODIUM CHLORIDE 0.9 % IV SOLN: INTRAVENOUS | Status: DC

## 2022-01-16 NOTE — Progress Notes (Unsigned)
CBC    Component Value Date/Time   WBC 6.1 01/14/2022 1047   RBC 2.18 (L) 01/14/2022 1047   HGB 6.9 (L) 01/14/2022 1047   HCT 19.5 (L) 01/14/2022 1047   PLT 201 01/14/2022 1047   MCV 89.4 01/14/2022 1047   MCH 31.7 01/14/2022 1047   MCHC 35.4 01/14/2022 1047   RDW 13.2 01/14/2022 1047   LYMPHSABS 1.5 11/08/2021 0636   MONOABS 0.6 11/08/2021 0636   EOSABS 0.5 11/08/2021 0636   BASOSABS 0.0 11/08/2021 0636

## 2022-01-16 NOTE — Telephone Encounter (Signed)
Left MessageCommunicated - Contacted patient to inform her of need to have blood infusion prior to her surgery. We will need to cancel her surgery for 01/17/2022 due to low hgb

## 2022-01-16 NOTE — Telephone Encounter (Signed)
Patient called in regarding having a blood transfusion on Friday at [facility] before her surgery and wanted to know if she could still have the surgery since she will be getting blood before the surgery.  Please advise.

## 2022-01-16 NOTE — Telephone Encounter (Signed)
Eulogio Ditch NP contacted patient to inform her of need to have blood infusion prior to her surgery.  We will need to cancel her surgery for 01/17/2022 due to low hgb.  Per this note in pt's chart I called and let her know that surgery should be cancelled at this time due to low hgb. Pt stated verbal understanding

## 2022-01-17 ENCOUNTER — Encounter
Admission: RE | Disposition: A | Discharge status: Home / Self Care | Payer: Self-pay | Source: Home / Self Care | Attending: Vascular Surgery

## 2022-01-17 ENCOUNTER — Other Ambulatory Visit: Payer: Self-pay

## 2022-01-17 ENCOUNTER — Encounter: Payer: Self-pay | Admitting: Vascular Surgery

## 2022-01-17 ENCOUNTER — Ambulatory Visit: Payer: BC Managed Care – PPO | Admitting: Urgent Care

## 2022-01-17 ENCOUNTER — Ambulatory Visit
Admission: RE | Admit: 2022-01-17 | Discharge: 2022-01-17 | Disposition: A | Discharge status: Home / Self Care | Payer: BC Managed Care – PPO | Attending: Vascular Surgery | Admitting: Vascular Surgery

## 2022-01-17 ENCOUNTER — Ambulatory Visit: Payer: BC Managed Care – PPO | Admitting: Anesthesiology

## 2022-01-17 DIAGNOSIS — Z01812 Encounter for preprocedural laboratory examination: Secondary | ICD-10-CM

## 2022-01-17 DIAGNOSIS — N186 End stage renal disease: Secondary | ICD-10-CM

## 2022-01-17 DIAGNOSIS — Z992 Dependence on renal dialysis: Secondary | ICD-10-CM | POA: Diagnosis not present

## 2022-01-17 DIAGNOSIS — I120 Hypertensive chronic kidney disease with stage 5 chronic kidney disease or end stage renal disease: Secondary | ICD-10-CM | POA: Diagnosis not present

## 2022-01-17 DIAGNOSIS — E8779 Other fluid overload: Secondary | ICD-10-CM | POA: Diagnosis not present

## 2022-01-17 DIAGNOSIS — D631 Anemia in chronic kidney disease: Secondary | ICD-10-CM | POA: Diagnosis not present

## 2022-01-17 DIAGNOSIS — F17210 Nicotine dependence, cigarettes, uncomplicated: Secondary | ICD-10-CM | POA: Diagnosis not present

## 2022-01-17 DIAGNOSIS — N184 Chronic kidney disease, stage 4 (severe): Secondary | ICD-10-CM

## 2022-01-17 HISTORY — PX: AV FISTULA PLACEMENT: SHX1204

## 2022-01-17 LAB — CBC
HCT: 19 % — ABNORMAL LOW (ref 36.0–46.0)
Hemoglobin: 6.8 g/dL — ABNORMAL LOW (ref 12.0–15.0)
MCH: 32.1 pg (ref 26.0–34.0)
MCHC: 35.8 g/dL (ref 30.0–36.0)
MCV: 89.6 fL (ref 80.0–100.0)
Platelets: 228 10*3/uL (ref 150–400)
RBC: 2.12 MIL/uL — ABNORMAL LOW (ref 3.87–5.11)
RDW: 13.1 % (ref 11.5–15.5)
WBC: 8.2 10*3/uL (ref 4.0–10.5)
nRBC: 0 % (ref 0.0–0.2)

## 2022-01-17 LAB — POCT PREGNANCY, URINE: Preg Test, Ur: NEGATIVE

## 2022-01-17 SURGERY — ARTERIOVENOUS (AV) FISTULA CREATION
Anesthesia: General | Laterality: Left

## 2022-01-17 MED ORDER — PHENYLEPHRINE HCL-NACL 20-0.9 MG/250ML-% IV SOLN
INTRAVENOUS | Status: DC | PRN
  Administered 2022-01-17: 30 ug/min via INTRAVENOUS

## 2022-01-17 MED ORDER — HEPARIN SODIUM (PORCINE) 5000 UNIT/ML IJ SOLN
INTRAMUSCULAR | Status: AC
  Filled 2022-01-17: qty 1

## 2022-01-17 MED ORDER — LIDOCAINE HCL (CARDIAC) PF 100 MG/5ML IV SOSY
PREFILLED_SYRINGE | INTRAVENOUS | Status: DC | PRN
Start: 2022-01-17 — End: 2022-01-17
  Administered 2022-01-17: 50 mg via INTRAVENOUS

## 2022-01-17 MED ORDER — BUPIVACAINE HCL (PF) 0.5 % IJ SOLN
INTRAMUSCULAR | Status: AC
  Filled 2022-01-17: qty 30

## 2022-01-17 MED ORDER — ONDANSETRON HCL 4 MG/2ML IJ SOLN
INTRAMUSCULAR | Status: AC
  Filled 2022-01-17: qty 2

## 2022-01-17 MED ORDER — EPHEDRINE SULFATE (PRESSORS) 50 MG/ML IJ SOLN
INTRAMUSCULAR | Status: DC | PRN
Start: 2022-01-17 — End: 2022-01-17
  Administered 2022-01-17 (×2): 10 mg via INTRAVENOUS
  Administered 2022-01-17: 5 mg via INTRAVENOUS

## 2022-01-17 MED ORDER — FENTANYL CITRATE (PF) 100 MCG/2ML IJ SOLN
INTRAMUSCULAR | Status: AC
  Filled 2022-01-17: qty 2

## 2022-01-17 MED ORDER — PAPAVERINE HCL 30 MG/ML IJ SOLN
INTRAMUSCULAR | Status: AC
  Filled 2022-01-17: qty 2

## 2022-01-17 MED ORDER — PHENYLEPHRINE HCL (PRESSORS) 10 MG/ML IV SOLN
INTRAVENOUS | Status: DC | PRN
  Administered 2022-01-17 (×3): 80 ug via INTRAVENOUS

## 2022-01-17 MED ORDER — ONDANSETRON HCL 4 MG PO TABS: 4.0000 mg | ORAL_TABLET | Freq: Three times a day (TID) | ORAL | 0 refills | Status: DC | PRN

## 2022-01-17 MED ORDER — BUPIVACAINE LIPOSOME 1.3 % IJ SUSP
INTRAMUSCULAR | Status: AC
  Filled 2022-01-17: qty 20

## 2022-01-17 MED ORDER — DEXAMETHASONE SODIUM PHOSPHATE 10 MG/ML IJ SOLN
INTRAMUSCULAR | Status: DC | PRN
  Administered 2022-01-17: 10 mg via INTRAVENOUS

## 2022-01-17 MED ORDER — HYDROMORPHONE HCL 1 MG/ML IJ SOLN: 0.5000 mg | INTRAMUSCULAR | Status: DC | PRN

## 2022-01-17 MED ORDER — CHLORHEXIDINE GLUCONATE 0.12 % MT SOLN
OROMUCOSAL | Status: AC
  Filled 2022-01-17: qty 15

## 2022-01-17 MED ORDER — HYDROMORPHONE HCL 1 MG/ML IJ SOLN: 1.0000 mg | Freq: Once | INTRAMUSCULAR | Status: DC | PRN

## 2022-01-17 MED ORDER — CEFAZOLIN SODIUM-DEXTROSE 2-4 GM/100ML-% IV SOLN
INTRAVENOUS | Status: AC
  Filled 2022-01-17: qty 100

## 2022-01-17 MED ORDER — PROPOFOL 10 MG/ML IV BOLUS
INTRAVENOUS | Status: AC
  Filled 2022-01-17: qty 20

## 2022-01-17 MED ORDER — ONDANSETRON HCL 4 MG/2ML IJ SOLN
INTRAMUSCULAR | Status: DC | PRN
  Administered 2022-01-17: 4 mg via INTRAVENOUS

## 2022-01-17 MED ORDER — ACETAMINOPHEN 10 MG/ML IV SOLN: 1000.0000 mg | Freq: Once | INTRAVENOUS | Status: DC | PRN

## 2022-01-17 MED ORDER — LIDOCAINE HCL (PF) 2 % IJ SOLN
INTRAMUSCULAR | Status: AC
  Filled 2022-01-17: qty 5

## 2022-01-17 MED ORDER — 0.9 % SODIUM CHLORIDE (POUR BTL) OPTIME
TOPICAL | Status: DC | PRN
  Administered 2022-01-17: 500 mL

## 2022-01-17 MED ORDER — FENTANYL CITRATE (PF) 100 MCG/2ML IJ SOLN
INTRAMUSCULAR | Status: DC | PRN
  Administered 2022-01-17: 50 ug via INTRAVENOUS
  Administered 2022-01-17 (×2): 25 ug via INTRAVENOUS

## 2022-01-17 MED ORDER — PROPOFOL 10 MG/ML IV BOLUS
INTRAVENOUS | Status: DC | PRN
  Administered 2022-01-17: 200 mg via INTRAVENOUS

## 2022-01-17 MED ORDER — ONDANSETRON HCL 4 MG/2ML IJ SOLN: 4.0000 mg | Freq: Once | INTRAMUSCULAR | Status: DC | PRN

## 2022-01-17 MED ORDER — DEXAMETHASONE SODIUM PHOSPHATE 10 MG/ML IJ SOLN
INTRAMUSCULAR | Status: AC
  Filled 2022-01-17: qty 1

## 2022-01-17 MED ORDER — ACETAMINOPHEN 10 MG/ML IV SOLN
INTRAVENOUS | Status: DC | PRN
  Administered 2022-01-17: 1000 mg via INTRAVENOUS

## 2022-01-17 MED ORDER — HYDROCODONE-ACETAMINOPHEN 5-325 MG PO TABS
1.0000 | ORAL_TABLET | Freq: Four times a day (QID) | ORAL | 0 refills | Status: DC | PRN
Start: 2022-01-17 — End: 2022-02-19

## 2022-01-17 MED ORDER — BUPIVACAINE LIPOSOME 1.3 % IJ SUSP
INTRAMUSCULAR | Status: DC | PRN
  Administered 2022-01-17: 5 mL

## 2022-01-17 MED ORDER — DEXMEDETOMIDINE (PRECEDEX) IN NS 20 MCG/5ML (4 MCG/ML) IV SYRINGE
PREFILLED_SYRINGE | INTRAVENOUS | Status: DC | PRN
  Administered 2022-01-17: 12 ug via INTRAVENOUS

## 2022-01-17 MED ORDER — HEPARIN 5000 UNITS IN NS 1000 ML (FLUSH)
INTRAMUSCULAR | Status: DC | PRN
  Administered 2022-01-17: 1 mL via INTRAMUSCULAR

## 2022-01-17 MED ORDER — MIDAZOLAM HCL 2 MG/2ML IJ SOLN
INTRAMUSCULAR | Status: AC
  Filled 2022-01-17: qty 2

## 2022-01-17 MED ORDER — ACETAMINOPHEN 10 MG/ML IV SOLN
INTRAVENOUS | Status: AC
  Filled 2022-01-17: qty 100

## 2022-01-17 MED ORDER — ONDANSETRON HCL 4 MG/2ML IJ SOLN: 4.0000 mg | Freq: Four times a day (QID) | INTRAMUSCULAR | Status: DC | PRN

## 2022-01-17 SURGICAL SUPPLY — 60 items
ADH SKN CLS APL DERMABOND .7 (GAUZE/BANDAGES/DRESSINGS) ×1
APL PRP STRL LF DISP 70% ISPRP (MISCELLANEOUS) ×1
APPLIER CLIP 11 MED OPEN (CLIP)
APPLIER CLIP 9.375 SM OPEN (CLIP)
APR CLP MED 11 20 MLT OPN (CLIP)
APR CLP SM 9.3 20 MLT OPN (CLIP)
BAG DECANTER FOR FLEXI CONT (MISCELLANEOUS) ×2 IMPLANT
BLADE SURG SZ11 CARB STEEL (BLADE) ×2 IMPLANT
BOOT SUTURE AID YELLOW STND (SUTURE) ×2 IMPLANT
BRUSH SCRUB EZ  4% CHG (MISCELLANEOUS) ×1
BRUSH SCRUB EZ 4% CHG (MISCELLANEOUS) ×1 IMPLANT
CHLORAPREP W/TINT 26 (MISCELLANEOUS) ×2 IMPLANT
CLIP APPLIE 11 MED OPEN (CLIP) IMPLANT
CLIP APPLIE 9.375 SM OPEN (CLIP) IMPLANT
DERMABOND ADVANCED (GAUZE/BANDAGES/DRESSINGS) ×1
DERMABOND ADVANCED .7 DNX12 (GAUZE/BANDAGES/DRESSINGS) ×1 IMPLANT
DRESSING SURGICEL FIBRLLR 1X2 (HEMOSTASIS) ×1 IMPLANT
DRSG SURGICEL FIBRILLAR 1X2 (HEMOSTASIS) ×2
ELECT CAUTERY BLADE 6.4 (BLADE) ×2 IMPLANT
ELECT REM PT RETURN 9FT ADLT (ELECTROSURGICAL) ×2
ELECTRODE REM PT RTRN 9FT ADLT (ELECTROSURGICAL) ×1 IMPLANT
GEL ULTRASOUND 20GR AQUASONIC (MISCELLANEOUS) IMPLANT
GLOVE SURG SYN 8.0 (GLOVE) ×2 IMPLANT
GLOVE SURG SYN 8.0 PF PI (GLOVE) ×1 IMPLANT
GOWN STRL REUS W/ TWL LRG LVL3 (GOWN DISPOSABLE) ×1 IMPLANT
GOWN STRL REUS W/ TWL XL LVL3 (GOWN DISPOSABLE) ×1 IMPLANT
GOWN STRL REUS W/TWL LRG LVL3 (GOWN DISPOSABLE) ×2
GOWN STRL REUS W/TWL XL LVL3 (GOWN DISPOSABLE) ×2
IV NS 500ML (IV SOLUTION) ×2
IV NS 500ML BAXH (IV SOLUTION) ×1 IMPLANT
KIT TURNOVER KIT A (KITS) ×2 IMPLANT
LABEL OR SOLS (LABEL) ×2 IMPLANT
LOOP RED MAXI  1X406MM (MISCELLANEOUS) ×1
LOOP VESSEL MAXI 1X406 RED (MISCELLANEOUS) ×1 IMPLANT
LOOP VESSEL MINI 0.8X406 BLUE (MISCELLANEOUS) ×2 IMPLANT
LOOPS BLUE MINI 0.8X406MM (MISCELLANEOUS) ×2
MANIFOLD NEPTUNE II (INSTRUMENTS) ×2 IMPLANT
NDL FILTER BLUNT 18X1 1/2 (NEEDLE) ×1 IMPLANT
NDL HYPO 30X.5 LL (NEEDLE) IMPLANT
NEEDLE FILTER BLUNT 18X 1/2SAF (NEEDLE) ×1
NEEDLE FILTER BLUNT 18X1 1/2 (NEEDLE) ×1 IMPLANT
NEEDLE HYPO 30X.5 LL (NEEDLE) IMPLANT
PACK EXTREMITY ARMC (MISCELLANEOUS) ×2 IMPLANT
PAD PREP 24X41 OB/GYN DISP (PERSONAL CARE ITEMS) ×2 IMPLANT
STOCKINETTE 48X4 2 PLY STRL (GAUZE/BANDAGES/DRESSINGS) ×1 IMPLANT
STOCKINETTE STRL 4IN 9604848 (GAUZE/BANDAGES/DRESSINGS) ×2 IMPLANT
SUT MNCRL+ 5-0 UNDYED PC-3 (SUTURE) ×1 IMPLANT
SUT MONOCRYL 5-0 (SUTURE) ×1
SUT PROLENE 6 0 BV (SUTURE) ×10 IMPLANT
SUT SILK 2 0 (SUTURE) ×2
SUT SILK 2-0 18XBRD TIE 12 (SUTURE) ×1 IMPLANT
SUT SILK 3 0 (SUTURE) ×2
SUT SILK 3-0 18XBRD TIE 12 (SUTURE) ×1 IMPLANT
SUT SILK 4 0 (SUTURE) ×2
SUT SILK 4-0 18XBRD TIE 12 (SUTURE) ×1 IMPLANT
SUT VIC AB 3-0 SH 27 (SUTURE) ×2
SUT VIC AB 3-0 SH 27X BRD (SUTURE) ×1 IMPLANT
SYR 20ML LL LF (SYRINGE) ×2 IMPLANT
SYR 3ML LL SCALE MARK (SYRINGE) ×2 IMPLANT
WATER STERILE IRR 500ML POUR (IV SOLUTION) ×2 IMPLANT

## 2022-01-17 NOTE — Op Note (Signed)
OPERATIVE NOTE   PROCEDURE: left radiocephalic arteriovenous fistula placement  PRE-OPERATIVE DIAGNOSIS: End Stage Renal Disease  POST-OPERATIVE DIAGNOSIS: End Stage Renal Disease  SURGEON: [name]  ASSISTANT(S): None  ANESTHESIA: general  ESTIMATED BLOOD LOSS: <50 cc  FINDING(S): 2.5 mm vein with a 2 mm radial artery  SPECIMEN(S):  none  INDICATIONS:   Holly [name] is a 36 y.o. female who presents with end stage renal disease.  The patient is scheduled for left brachiocephalic arteriovenous fistula placement.  The patient is aware the risks include but are not limited to: bleeding, infection, steal syndrome, nerve damage, ischemic monomelic neuropathy, failure to mature, and need for additional procedures.  The patient is aware of the risks of the procedure and elects to proceed forward.  DESCRIPTION: After full informed written consent was obtained from the patient, the patient was brought back to the operating room and placed supine upon the operating table.  Prior to induction, the patient received IV antibiotics.   After obtaining adequate anesthesia, the patient was then prepped and draped in the standard fashion for a left arm access procedure.   A first assistant was required to provide a safe and appropriate environment for executing the surgery.  The assistant was integral in providing retraction, exposure, running suture providing suction and in the closing process.   A linear incision was then created midway between the radial impulse and the cephalic vein. The cephalic vein was then identified and dissected circumferentially. It was marked with a surgical marker.    Attention was then turned to the radial artery which was exposed through the same incision and looped proximally and distally. Side branches were controlled with 4-0 silk ties.  The distal segment of the vein was ligated with a  2-0 silk, and the vein was transected.  The proximal  segment was interrogated with serial dilators.  The vein accepted up to a 2.5 mm dilator with some difficulty. Heparinized saline was infused into the vein and clamped it with a small bulldog.  At this point, I reset my exposure of the radial artery and controlled the artery with vessel loops proximally and distally.  An arteriotomy was then made with a #11 blade, and extended with a Potts scissor.  The radial artery was except only a 2 mm Garrett coronary dilator.  Heparinized saline was injected proximal and distal into the radial artery.  The vein was then approximated to the artery while the artery was in its native bed and subsequently the vein was beveled using Potts scissors. The vein was then sewn to the artery in an end-to-side configuration with interrupted 6-0 Prolene sutures.  Prior to completing this anastomosis Flushing maneuvers were performed and the artery was allowed to forward and back bleed.  There was no evidence of clot from any vessels.  I completed the anastomosis in the usual fashion and then released all vessel loops and clamps.    There was good  thrill in the venous outflow, and there was 1+ palpable radial pulse.  At this point, I irrigated out the surgical wound.  There was no further active bleeding.  The subcutaneous tissue was reapproximated with a running stitch of 3-0 Vicryl.  The skin was then reapproximated with a running subcuticular stitch of 4-0 Vicryl.  The skin was then cleaned, dried, and reinforced with Dermabond.    The patient tolerated this procedure well.   COMPLICATIONS: None  CONDITION: Good   [name] [facility]  Office: [phone]   01/17/2022, 12:39 PM

## 2022-01-17 NOTE — Anesthesia Postprocedure Evaluation (Signed)
Anesthesia Post Note  Patient: Holly [name]  Procedure(s) Performed: ARTERIOVENOUS (AV) FISTULA CREATION ( RADIAL CEPHALIC) (Left)  Patient location during evaluation: PACU Anesthesia Type: General Level of consciousness: awake and oriented Pain management: pain level controlled Vital Signs Assessment: post-procedure vital signs reviewed and stable Respiratory status: spontaneous breathing and respiratory function stable Cardiovascular status: stable Anesthetic complications: no   No notable events documented.   Last Vitals:  Vitals:   01/17/22 1252 01/17/22 1300  BP:  134/77  Pulse: 78 81  Resp: 11 17  Temp:    SpO2: 100% 100%    Last Pain:  Vitals:   01/17/22 1300  TempSrc:   PainSc: 0-No pain                 VAN STAVEREN,Jahlia Omura

## 2022-01-17 NOTE — Anesthesia Preprocedure Evaluation (Signed)
Anesthesia Evaluation  Patient identified by MRN, date of birth, ID band Patient awake    Reviewed: Allergy & Precautions, NPO status , Patient's Chart, lab work & pertinent test results  History of Anesthesia Complications Negative for: history of anesthetic complications  Airway Mallampati: II  TM Distance: >3 FB Neck ROM: Full    Dental no notable dental hx. (+) Teeth Intact   Pulmonary neg sleep apnea, neg COPD, Current Smoker and Patient abstained from smoking.,  No smoking for 8 days   Pulmonary exam normal breath sounds clear to auscultation       Cardiovascular Exercise Tolerance: Good METShypertension, Pt. on medications (-) CAD and (-) Past MI (-) dysrhythmias  Rhythm:Regular Rate:Normal - Systolic murmurs    Neuro/Psych PSYCHIATRIC DISORDERS Anxiety Depression negative neurological ROS     GI/Hepatic neg GERD  ,(+)     (-) substance abuse  ,   Endo/Other  neg diabetesHypothyroidism   Renal/GU ESRF and DialysisRenal diseasenegative Renal ROS     Musculoskeletal   Abdominal   Peds  Hematology  (+) Blood dyscrasia, anemia , Anemia of chronic disease, s/p multiple blood transfusions this year. Patient currently getting transfused preop for Hgb 6.8.   Anesthesia Other Findings Past Medical History: No date: Anemia No date: Anxiety No date: Arthritis     Comment:  hands No date: Cancer ([place]) No date: Complication of anesthesia     Comment:  with Fentanyl she is difficult to awake No date: Depression No date: Dialysis patient ([place]) No date: Dyspnea     Comment:  due to anemia No date: History of kidney stones No date: Hypertension No date: Hypothyroidism No date: Pneumonia No date: Renal disorder No date: Thyroid disease  Reproductive/Obstetrics                             Anesthesia Physical Anesthesia Plan  ASA: 4  Anesthesia Plan: General   Post-op Pain  Management: Ofirmev IV (intra-op)*   Induction: Intravenous  PONV Risk Score and Plan: 3 and Ondansetron, Dexamethasone and Midazolam  Airway Management Planned: LMA  Additional Equipment: None  Intra-op Plan:   Post-operative Plan: Extubation in OR  Informed Consent: I have reviewed the patients History and Physical, chart, labs and discussed the procedure including the risks, benefits and alternatives for the proposed anesthesia with the patient or authorized representative who has indicated his/her understanding and acceptance.     Dental advisory given  Plan Discussed with: CRNA and Surgeon  Anesthesia Plan Comments: (Discussed risks of anesthesia with patient, including PONV, sore throat, lip/dental/eye damage. Rare risks discussed as well, such as cardiorespiratory and neurological sequelae, and allergic reactions. Discussed the role of CRNA in patient's perioperative care. Patient understands.  Patient says that the vascular NP told her they would plan to place the fistula as high up on the arm as possible (brachial axillary), thus I told patient a block may not fully anesthetize the area. She is in agreement.)        Anesthesia Quick Evaluation

## 2022-01-17 NOTE — Discharge Instructions (Signed)
AMBULATORY SURGERY  ?DISCHARGE INSTRUCTIONS ? ? ?The drugs that you were given will stay in your system until tomorrow so for the next 24 hours you should not: ? ?Drive an automobile ?Make any legal decisions ?Drink any alcoholic beverage ? ? ?You may resume regular meals tomorrow.  Today it is better to start with liquids and gradually work up to solid foods. ? ?You may eat anything you prefer, but it is better to start with liquids, then soup and crackers, and gradually work up to solid foods. ? ? ?Please notify your doctor immediately if you have any unusual bleeding, trouble breathing, redness and pain at the surgery site, drainage, fever, or pain not relieved by medication. ? ? ? ?Additional Instructions: ? ? ? ?Please contact your physician with any problems or Same Day Surgery at [phone], Monday through Friday 6 am to 4 pm, or [place] [facility] number at [phone].  ?

## 2022-01-17 NOTE — Interval H&P Note (Signed)
History and Physical Interval Note:  01/17/2022 10:19 AM  Holly [name]  has presented today for surgery, with the diagnosis of ESRD.  The various methods of treatment have been discussed with the patient and family. After consideration of risks, benefits and other options for treatment, the patient has consented to  Procedure(s): ARTERIOVENOUS (AV) FISTULA CREATION ( RADIAL CEPHALIC) (Left) as a surgical intervention.  The patient's history has been reviewed, patient examined, no change in status, stable for surgery.  I have reviewed the patient's chart and labs.  Questions were answered to the patient's satisfaction.     [name]

## 2022-01-17 NOTE — Anesthesia Procedure Notes (Signed)
Procedure Name: LMA Insertion Date/Time: 01/17/2022 10:52 AM Performed by: Debe Coder, CRNA Pre-anesthesia Checklist: Patient identified, Emergency Drugs available, Suction available and Patient being monitored Patient Re-evaluated:Patient Re-evaluated prior to induction Oxygen Delivery Method: Circle system utilized Preoxygenation: Pre-oxygenation with 100% oxygen Induction Type: IV induction Ventilation: Mask ventilation without difficulty LMA: LMA inserted LMA Size: 4.0 Number of attempts: 1 Placement Confirmation: positive ETCO2 and breath sounds checked- equal and bilateral Tube secured with: Tape Dental Injury: Teeth and Oropharynx as per pre-operative assessment

## 2022-01-17 NOTE — Transfer of Care (Signed)
Immediate Anesthesia Transfer of Care Note  Patient: Holly [name]  Procedure(s) Performed: ARTERIOVENOUS (AV) FISTULA CREATION ( RADIAL CEPHALIC) (Left)  Patient Location: PACU  Anesthesia Type:General  Level of Consciousness: awake and drowsy  Airway & Oxygen Therapy: Patient Spontanous Breathing and Patient connected to face mask oxygen  Post-op Assessment: Report given to RN and Post -op Vital signs reviewed and stable  Post vital signs: Reviewed and stable  Last Vitals:  Vitals Value Taken Time  BP 115/66 01/17/22 1245  Temp 36.4 C 01/17/22 1245  Pulse 78 01/17/22 1252  Resp 11 01/17/22 1252  SpO2 100 % 01/17/22 1252    Last Pain:  Vitals:   01/17/22 1245  TempSrc:   PainSc: 0-No pain         Complications: No notable events documented.

## 2022-01-18 DIAGNOSIS — E8779 Other fluid overload: Secondary | ICD-10-CM | POA: Diagnosis not present

## 2022-01-18 DIAGNOSIS — N186 End stage renal disease: Secondary | ICD-10-CM | POA: Diagnosis not present

## 2022-01-18 DIAGNOSIS — Z992 Dependence on renal dialysis: Secondary | ICD-10-CM | POA: Diagnosis not present

## 2022-01-18 LAB — TYPE AND SCREEN
ABO/RH(D): O POS
Antibody Screen: NEGATIVE
Donor AG Type: NEGATIVE
Extend sample reason: TRANSFUSED
Unit division: 0
Unit division: 0
Unit division: 0

## 2022-01-18 LAB — BPAM RBC
Blood Product Expiration Date: 202306282359
Blood Product Expiration Date: 202306302359
Blood Product Expiration Date: 202306302359
ISSUE DATE / TIME: 202305260628
Unit Type and Rh: 5100
Unit Type and Rh: 5100
Unit Type and Rh: 5100

## 2022-01-19 DIAGNOSIS — E8779 Other fluid overload: Secondary | ICD-10-CM | POA: Diagnosis not present

## 2022-01-19 DIAGNOSIS — N186 End stage renal disease: Secondary | ICD-10-CM | POA: Diagnosis not present

## 2022-01-19 DIAGNOSIS — Z992 Dependence on renal dialysis: Secondary | ICD-10-CM | POA: Diagnosis not present

## 2022-01-20 DIAGNOSIS — Z992 Dependence on renal dialysis: Secondary | ICD-10-CM | POA: Diagnosis not present

## 2022-01-20 DIAGNOSIS — E8779 Other fluid overload: Secondary | ICD-10-CM | POA: Diagnosis not present

## 2022-01-20 DIAGNOSIS — N186 End stage renal disease: Secondary | ICD-10-CM | POA: Diagnosis not present

## 2022-01-21 DIAGNOSIS — E8779 Other fluid overload: Secondary | ICD-10-CM | POA: Diagnosis not present

## 2022-01-21 DIAGNOSIS — Z992 Dependence on renal dialysis: Secondary | ICD-10-CM | POA: Diagnosis not present

## 2022-01-21 DIAGNOSIS — N186 End stage renal disease: Secondary | ICD-10-CM | POA: Diagnosis not present

## 2022-01-22 DIAGNOSIS — N186 End stage renal disease: Secondary | ICD-10-CM | POA: Diagnosis not present

## 2022-01-22 DIAGNOSIS — E8779 Other fluid overload: Secondary | ICD-10-CM | POA: Diagnosis not present

## 2022-01-22 DIAGNOSIS — Z992 Dependence on renal dialysis: Secondary | ICD-10-CM | POA: Diagnosis not present

## 2022-01-23 DIAGNOSIS — N186 End stage renal disease: Secondary | ICD-10-CM | POA: Diagnosis not present

## 2022-01-23 DIAGNOSIS — Z992 Dependence on renal dialysis: Secondary | ICD-10-CM | POA: Diagnosis not present

## 2022-01-23 DIAGNOSIS — E8779 Other fluid overload: Secondary | ICD-10-CM | POA: Diagnosis not present

## 2022-01-24 DIAGNOSIS — N186 End stage renal disease: Secondary | ICD-10-CM | POA: Diagnosis not present

## 2022-01-24 DIAGNOSIS — Z992 Dependence on renal dialysis: Secondary | ICD-10-CM | POA: Diagnosis not present

## 2022-01-24 DIAGNOSIS — E8779 Other fluid overload: Secondary | ICD-10-CM | POA: Diagnosis not present

## 2022-01-25 DIAGNOSIS — Z992 Dependence on renal dialysis: Secondary | ICD-10-CM | POA: Diagnosis not present

## 2022-01-25 DIAGNOSIS — E8779 Other fluid overload: Secondary | ICD-10-CM | POA: Diagnosis not present

## 2022-01-25 DIAGNOSIS — N186 End stage renal disease: Secondary | ICD-10-CM | POA: Diagnosis not present

## 2022-01-26 DIAGNOSIS — E8779 Other fluid overload: Secondary | ICD-10-CM | POA: Diagnosis not present

## 2022-01-26 DIAGNOSIS — N186 End stage renal disease: Secondary | ICD-10-CM | POA: Diagnosis not present

## 2022-01-26 DIAGNOSIS — Z992 Dependence on renal dialysis: Secondary | ICD-10-CM | POA: Diagnosis not present

## 2022-01-27 DIAGNOSIS — Z992 Dependence on renal dialysis: Secondary | ICD-10-CM | POA: Diagnosis not present

## 2022-01-27 DIAGNOSIS — N186 End stage renal disease: Secondary | ICD-10-CM | POA: Diagnosis not present

## 2022-01-27 DIAGNOSIS — E8779 Other fluid overload: Secondary | ICD-10-CM | POA: Diagnosis not present

## 2022-01-28 DIAGNOSIS — N186 End stage renal disease: Secondary | ICD-10-CM | POA: Diagnosis not present

## 2022-01-28 DIAGNOSIS — Z992 Dependence on renal dialysis: Secondary | ICD-10-CM | POA: Diagnosis not present

## 2022-01-28 DIAGNOSIS — E8779 Other fluid overload: Secondary | ICD-10-CM | POA: Diagnosis not present

## 2022-01-29 ENCOUNTER — Encounter: Payer: Self-pay | Admitting: Gastroenterology

## 2022-01-30 ENCOUNTER — Inpatient Hospital Stay
Admission: EM | Admit: 2022-01-30 | Discharge: 2022-02-02 | DRG: 640 | Disposition: A | Discharge status: Home / Self Care | Payer: Medicare Other | Attending: Internal Medicine | Admitting: Internal Medicine

## 2022-01-30 ENCOUNTER — Other Ambulatory Visit: Payer: Self-pay

## 2022-01-30 ENCOUNTER — Encounter: Payer: Self-pay | Admitting: Anesthesiology

## 2022-01-30 ENCOUNTER — Encounter: Payer: Self-pay | Admitting: Gastroenterology

## 2022-01-30 ENCOUNTER — Ambulatory Visit
Admission: RE | Admit: 2022-01-30 | Discharge: 2022-01-30 | Disposition: A | Discharge status: Home / Self Care | Payer: BC Managed Care – PPO | Source: Home / Self Care | Attending: Gastroenterology | Admitting: Gastroenterology

## 2022-01-30 ENCOUNTER — Encounter
Admission: RE | Disposition: A | Discharge status: Still In Hospital | Payer: Self-pay | Source: Home / Self Care | Attending: Gastroenterology

## 2022-01-30 ENCOUNTER — Ambulatory Visit
Admission: RE | Admit: 2022-01-30 | Discharge: 2022-01-30 | Disposition: A | Discharge status: Still In Hospital | Payer: BC Managed Care – PPO | Attending: Gastroenterology | Admitting: Gastroenterology

## 2022-01-30 DIAGNOSIS — D649 Anemia, unspecified: Secondary | ICD-10-CM | POA: Diagnosis not present

## 2022-01-30 DIAGNOSIS — Z820 Family history of epilepsy and other diseases of the nervous system: Secondary | ICD-10-CM

## 2022-01-30 DIAGNOSIS — K641 Second degree hemorrhoids: Secondary | ICD-10-CM | POA: Diagnosis present

## 2022-01-30 DIAGNOSIS — G40909 Epilepsy, unspecified, not intractable, without status epilepticus: Secondary | ICD-10-CM | POA: Diagnosis present

## 2022-01-30 DIAGNOSIS — F419 Anxiety disorder, unspecified: Secondary | ICD-10-CM | POA: Diagnosis present

## 2022-01-30 DIAGNOSIS — N920 Excessive and frequent menstruation with regular cycle: Secondary | ICD-10-CM | POA: Diagnosis present

## 2022-01-30 DIAGNOSIS — Z881 Allergy status to other antibiotic agents status: Secondary | ICD-10-CM | POA: Diagnosis not present

## 2022-01-30 DIAGNOSIS — I120 Hypertensive chronic kidney disease with stage 5 chronic kidney disease or end stage renal disease: Secondary | ICD-10-CM | POA: Diagnosis present

## 2022-01-30 DIAGNOSIS — Z888 Allergy status to other drugs, medicaments and biological substances status: Secondary | ICD-10-CM

## 2022-01-30 DIAGNOSIS — Z886 Allergy status to analgesic agent status: Secondary | ICD-10-CM

## 2022-01-30 DIAGNOSIS — F32A Depression, unspecified: Secondary | ICD-10-CM | POA: Diagnosis present

## 2022-01-30 DIAGNOSIS — Z91119 Patient's noncompliance with dietary regimen due to unspecified reason: Secondary | ICD-10-CM

## 2022-01-30 DIAGNOSIS — Z87442 Personal history of urinary calculi: Secondary | ICD-10-CM | POA: Diagnosis not present

## 2022-01-30 DIAGNOSIS — N186 End stage renal disease: Secondary | ICD-10-CM | POA: Diagnosis not present

## 2022-01-30 DIAGNOSIS — D638 Anemia in other chronic diseases classified elsewhere: Secondary | ICD-10-CM | POA: Diagnosis not present

## 2022-01-30 DIAGNOSIS — Z992 Dependence on renal dialysis: Secondary | ICD-10-CM | POA: Diagnosis not present

## 2022-01-30 DIAGNOSIS — Z825 Family history of asthma and other chronic lower respiratory diseases: Secondary | ICD-10-CM | POA: Diagnosis not present

## 2022-01-30 DIAGNOSIS — E875 Hyperkalemia: Secondary | ICD-10-CM | POA: Insufficient documentation

## 2022-01-30 DIAGNOSIS — N2581 Secondary hyperparathyroidism of renal origin: Secondary | ICD-10-CM | POA: Diagnosis not present

## 2022-01-30 DIAGNOSIS — F17210 Nicotine dependence, cigarettes, uncomplicated: Secondary | ICD-10-CM | POA: Diagnosis present

## 2022-01-30 DIAGNOSIS — Z538 Procedure and treatment not carried out for other reasons: Secondary | ICD-10-CM | POA: Insufficient documentation

## 2022-01-30 DIAGNOSIS — D509 Iron deficiency anemia, unspecified: Secondary | ICD-10-CM | POA: Diagnosis not present

## 2022-01-30 DIAGNOSIS — K921 Melena: Secondary | ICD-10-CM | POA: Insufficient documentation

## 2022-01-30 DIAGNOSIS — E8770 Fluid overload, unspecified: Secondary | ICD-10-CM | POA: Diagnosis present

## 2022-01-30 DIAGNOSIS — D631 Anemia in chronic kidney disease: Secondary | ICD-10-CM | POA: Diagnosis not present

## 2022-01-30 DIAGNOSIS — E785 Hyperlipidemia, unspecified: Secondary | ICD-10-CM | POA: Diagnosis present

## 2022-01-30 DIAGNOSIS — Z885 Allergy status to narcotic agent status: Secondary | ICD-10-CM

## 2022-01-30 DIAGNOSIS — I10 Essential (primary) hypertension: Secondary | ICD-10-CM | POA: Diagnosis present

## 2022-01-30 DIAGNOSIS — E039 Hypothyroidism, unspecified: Secondary | ICD-10-CM | POA: Diagnosis not present

## 2022-01-30 DIAGNOSIS — E871 Hypo-osmolality and hyponatremia: Secondary | ICD-10-CM | POA: Diagnosis present

## 2022-01-30 DIAGNOSIS — Z79899 Other long term (current) drug therapy: Secondary | ICD-10-CM

## 2022-01-30 DIAGNOSIS — D62 Acute posthemorrhagic anemia: Secondary | ICD-10-CM | POA: Diagnosis present

## 2022-01-30 DIAGNOSIS — E063 Autoimmune thyroiditis: Secondary | ICD-10-CM | POA: Diagnosis present

## 2022-01-30 DIAGNOSIS — K625 Hemorrhage of anus and rectum: Secondary | ICD-10-CM | POA: Diagnosis not present

## 2022-01-30 DIAGNOSIS — Z809 Family history of malignant neoplasm, unspecified: Secondary | ICD-10-CM | POA: Diagnosis not present

## 2022-01-30 DIAGNOSIS — E8779 Other fluid overload: Secondary | ICD-10-CM | POA: Diagnosis not present

## 2022-01-30 DIAGNOSIS — R195 Other fecal abnormalities: Secondary | ICD-10-CM | POA: Diagnosis present

## 2022-01-30 DIAGNOSIS — Z79890 Hormone replacement therapy (postmenopausal): Secondary | ICD-10-CM

## 2022-01-30 HISTORY — DX: Hyperlipidemia, unspecified: E78.5

## 2022-01-30 HISTORY — DX: Epilepsy, unspecified, not intractable, without status epilepticus: G40.909

## 2022-01-30 HISTORY — PX: COLONOSCOPY: SHX5424

## 2022-01-30 HISTORY — PX: ESOPHAGOGASTRODUODENOSCOPY: SHX5428

## 2022-01-30 HISTORY — DX: Autoimmune thyroiditis: E06.3

## 2022-01-30 LAB — MAGNESIUM: Magnesium: 2.5 mg/dL — ABNORMAL HIGH (ref 1.7–2.4)

## 2022-01-30 LAB — CBC
HCT: 19.5 % — ABNORMAL LOW (ref 36.0–46.0)
Hemoglobin: 6.6 g/dL — ABNORMAL LOW (ref 12.0–15.0)
MCH: 31.3 pg (ref 26.0–34.0)
MCHC: 33.8 g/dL (ref 30.0–36.0)
MCV: 92.4 fL (ref 80.0–100.0)
Platelets: 181 10*3/uL (ref 150–400)
RBC: 2.11 MIL/uL — ABNORMAL LOW (ref 3.87–5.11)
RDW: 13.5 % (ref 11.5–15.5)
WBC: 5.3 10*3/uL (ref 4.0–10.5)
nRBC: 0 % (ref 0.0–0.2)

## 2022-01-30 LAB — POCT I-STAT, CHEM 8
BUN: 54 mg/dL — ABNORMAL HIGH (ref 6–20)
Calcium, Ion: 1.19 mmol/L (ref 1.15–1.40)
Chloride: 95 mmol/L — ABNORMAL LOW (ref 98–111)
Creatinine, Ser: 13.8 mg/dL — ABNORMAL HIGH (ref 0.44–1.00)
Glucose, Bld: 77 mg/dL (ref 70–99)
HCT: 18 % — ABNORMAL LOW (ref 36.0–46.0)
Hemoglobin: 6.1 g/dL — CL (ref 12.0–15.0)
Potassium: 6.5 mmol/L (ref 3.5–5.1)
Sodium: 130 mmol/L — ABNORMAL LOW (ref 135–145)
TCO2: 26 mmol/L (ref 22–32)

## 2022-01-30 LAB — PHOSPHORUS: Phosphorus: 10 mg/dL — ABNORMAL HIGH (ref 2.5–4.6)

## 2022-01-30 LAB — BASIC METABOLIC PANEL
Anion gap: 10 (ref 5–15)
BUN: 53 mg/dL — ABNORMAL HIGH (ref 6–20)
CO2: 24 mmol/L (ref 22–32)
Calcium: 9.5 mg/dL (ref 8.9–10.3)
Chloride: 98 mmol/L (ref 98–111)
Creatinine, Ser: 11.78 mg/dL — ABNORMAL HIGH (ref 0.44–1.00)
GFR, Estimated: 4 mL/min — ABNORMAL LOW (ref >60)
Glucose, Bld: 81 mg/dL (ref 70–99)
Potassium: 7.4 mmol/L (ref 3.5–5.1)
Sodium: 132 mmol/L — ABNORMAL LOW (ref 135–145)

## 2022-01-30 LAB — PREPARE RBC (CROSSMATCH)

## 2022-01-30 LAB — CBG MONITORING, ED
Glucose-Capillary: 105 mg/dL — ABNORMAL HIGH (ref 70–99)
Glucose-Capillary: 112 mg/dL — ABNORMAL HIGH (ref 70–99)

## 2022-01-30 LAB — POTASSIUM
Potassium: 7.4 mmol/L (ref 3.5–5.1)
Potassium: 6.2 mmol/L — ABNORMAL HIGH (ref 3.5–5.1)
Potassium: 3.8 mmol/L (ref 3.5–5.1)

## 2022-01-30 LAB — HEPATITIS B SURFACE ANTIGEN: Hepatitis B Surface Ag: NONREACTIVE

## 2022-01-30 LAB — HEPATITIS B CORE ANTIBODY, TOTAL: Hep B Core Total Ab: NONREACTIVE

## 2022-01-30 LAB — POCT PREGNANCY, URINE: Preg Test, Ur: NEGATIVE

## 2022-01-30 LAB — HEPATITIS C ANTIBODY: HCV Ab: NONREACTIVE

## 2022-01-30 LAB — MRSA NEXT GEN BY PCR, NASAL: MRSA by PCR Next Gen: NOT DETECTED

## 2022-01-30 LAB — HEPATITIS B SURFACE ANTIBODY,QUALITATIVE: Hep B S Ab: REACTIVE — AB

## 2022-01-30 SURGERY — COLONOSCOPY
Anesthesia: General

## 2022-01-30 MED ORDER — MELATONIN 5 MG PO TABS
5.0000 mg | ORAL_TABLET | Freq: Every evening | ORAL | Status: DC | PRN
  Administered 2022-01-31: 5 mg via ORAL
  Filled 2022-01-30: qty 1

## 2022-01-30 MED ORDER — ACETAMINOPHEN 325 MG PO TABS
650.0000 mg | ORAL_TABLET | Freq: Four times a day (QID) | ORAL | Status: DC | PRN
  Administered 2022-01-30 – 2022-02-01 (×3): 650 mg via ORAL
  Filled 2022-01-30 (×2): qty 2

## 2022-01-30 MED ORDER — LEVOTHYROXINE SODIUM 100 MCG PO TABS
200.0000 ug | ORAL_TABLET | Freq: Every day | ORAL | Status: DC
Start: 2022-01-31 — End: 2022-02-02
  Administered 2022-02-01 – 2022-02-02 (×2): 200 ug via ORAL
  Filled 2022-01-30 (×2): qty 2

## 2022-01-30 MED ORDER — CHLORHEXIDINE GLUCONATE CLOTH 2 % EX PADS
6.0000 | MEDICATED_PAD | Freq: Every day | CUTANEOUS | Status: DC
  Administered 2022-01-30 – 2022-02-02 (×4): 6 via TOPICAL

## 2022-01-30 MED ORDER — SODIUM CHLORIDE 0.9 % IV SOLN: 10.0000 mL/h | Freq: Once | INTRAVENOUS | Status: AC

## 2022-01-30 MED ORDER — LIDOCAINE HCL (PF) 1 % IJ SOLN: 5.0000 mL | INTRAMUSCULAR | Status: DC | PRN

## 2022-01-30 MED ORDER — CALCIUM GLUCONATE-NACL 1-0.675 GM/50ML-% IV SOLN: 1.0000 g | Freq: Once | INTRAVENOUS | Status: DC

## 2022-01-30 MED ORDER — CALCIUM GLUCONATE-NACL 1-0.675 GM/50ML-% IV SOLN
1.0000 g | Freq: Once | INTRAVENOUS | Status: AC
  Administered 2022-01-30: 1000 mg via INTRAVENOUS
  Filled 2022-01-30: qty 50

## 2022-01-30 MED ORDER — ALTEPLASE 2 MG IJ SOLR: 2.0000 mg | Freq: Once | INTRAMUSCULAR | Status: DC | PRN

## 2022-01-30 MED ORDER — SODIUM CHLORIDE 0.9 % IV SOLN: INTRAVENOUS | Status: DC

## 2022-01-30 MED ORDER — DEXTROSE 50 % IV SOLN
1.0000 | Freq: Once | INTRAVENOUS | Status: AC
  Administered 2022-01-30: 50 mL via INTRAVENOUS
  Filled 2022-01-30: qty 50

## 2022-01-30 MED ORDER — ALBUTEROL SULFATE (2.5 MG/3ML) 0.083% IN NEBU
5.0000 mg | INHALATION_SOLUTION | Freq: Once | RESPIRATORY_TRACT | Status: AC
  Administered 2022-01-30: 5 mg via RESPIRATORY_TRACT
  Filled 2022-01-30: qty 6

## 2022-01-30 MED ORDER — HEPARIN SODIUM (PORCINE) 1000 UNIT/ML IJ SOLN
INTRAMUSCULAR | Status: AC
  Filled 2022-01-30: qty 10

## 2022-01-30 MED ORDER — SODIUM BICARBONATE 8.4 % IV SOLN
50.0000 meq | Freq: Once | INTRAVENOUS | Status: AC
  Administered 2022-01-30: 50 meq via INTRAVENOUS
  Filled 2022-01-30: qty 50

## 2022-01-30 MED ORDER — INSULIN ASPART 100 UNIT/ML IJ SOLN
5.0000 [IU] | Freq: Once | INTRAMUSCULAR | Status: AC
  Administered 2022-01-30: 5 [IU] via INTRAVENOUS
  Filled 2022-01-30: qty 1

## 2022-01-30 MED ORDER — SODIUM CHLORIDE 0.9% IV SOLUTION: Freq: Once | INTRAVENOUS | Status: DC

## 2022-01-30 MED ORDER — CARVEDILOL 12.5 MG PO TABS
12.5000 mg | ORAL_TABLET | Freq: Two times a day (BID) | ORAL | Status: DC
  Administered 2022-01-31 – 2022-02-02 (×5): 12.5 mg via ORAL
  Filled 2022-01-30 (×5): qty 1

## 2022-01-30 MED ORDER — SODIUM ZIRCONIUM CYCLOSILICATE 5 G PO PACK
10.0000 g | PACK | Freq: Two times a day (BID) | ORAL | Status: AC
  Administered 2022-01-31: 10 g via ORAL
  Filled 2022-01-30: qty 2

## 2022-01-30 MED ORDER — POLYETHYLENE GLYCOL 3350 17 G PO PACK: 17.0000 g | PACK | Freq: Every day | ORAL | Status: DC | PRN

## 2022-01-30 MED ORDER — HEPARIN SODIUM (PORCINE) 1000 UNIT/ML DIALYSIS
1000.0000 [IU] | INTRAMUSCULAR | Status: DC | PRN
  Administered 2022-01-30: 1000 [IU] via INTRAVENOUS_CENTRAL

## 2022-01-30 MED ORDER — PENTAFLUOROPROP-TETRAFLUOROETH EX AERO: 1.0000 "application " | INHALATION_SPRAY | CUTANEOUS | Status: DC | PRN

## 2022-01-30 MED ORDER — ONDANSETRON HCL 4 MG/2ML IJ SOLN
4.0000 mg | Freq: Four times a day (QID) | INTRAMUSCULAR | Status: DC | PRN
  Administered 2022-01-31: 4 mg via INTRAVENOUS
  Filled 2022-01-30: qty 2

## 2022-01-30 MED ORDER — LIDOCAINE-PRILOCAINE 2.5-2.5 % EX CREA
1.0000 | TOPICAL_CREAM | CUTANEOUS | Status: DC | PRN
Start: 2022-01-30 — End: 2022-02-01

## 2022-01-30 NOTE — H&P (Signed)
Pre-Procedure H&P   Patient ID: Holly [name] is a 36 y.o. female.  Gastroenterology Provider: Annamaria Helling, DO  PCP: Pcp, No  Date: 01/30/2022  HPI Ms. Holly [name] is a 36 y.o. female who presents today for Esophagogastroduodenoscopy and Colonoscopy for Iron deficiency anemia, melena, bright red blood per rectum. Patient with end-stage renal disease on dialysis with recurrent symptomatic anemia requiring transfusion every several weeks.  She notes bright red blood per rectum several days prior to her menses where she notes clots with the stool.  Despite Epogen and iron therapy she has continued anemia. Symptoms consist of weakness shortness of breath and headache when her counts are low.  She denies any dysphagia or odynophagia.  Had dark stools when on oral iron but was occult blood negative. Celiac testing negative Hyponatremic at 129 She lost weight with stopping peritoneal dialysis has gained some of this back This was initially noted in February her hemoglobin was 5.4. Her ferritin has been 605 saturation 65% TIBC 245 folate 4.4 B12 within normal limits reticulocyte count 2.2.  Last EGD and colonoscopy in [place] demonstrating gastroparesis and a normal colonoscopy  No other acute gi complaints Past Medical History:  Diagnosis Date   Anemia    Anxiety    Arthritis    hands   Cancer ([place])    Complication of anesthesia    with Fentanyl she is difficult to awake   Depression    Dialysis patient ([place])    Dyspnea    due to anemia   Epilepsy ([place])    Hashimoto's disease    History of kidney stones    HLD (hyperlipidemia)    Hypertension    Hypothyroidism    Pneumonia    Renal disorder    Thyroid disease     Past Surgical History:  Procedure Laterality Date   ABDOMINAL SURGERY     AV FISTULA PLACEMENT Left 01/17/2022   Procedure: ARTERIOVENOUS (AV) FISTULA CREATION ( RADIAL CEPHALIC);  Surgeon: Katha Cabal, MD;  Location: [facility]  ORS;  Service: Vascular;  Laterality: Left;   CERVICAL BIOPSY  W/ LOOP ELECTRODE EXCISION  2018   DIAGNOSTIC LAPAROSCOPY     due to removal of PD catheters   RENAL BIOPSY     x 2   TUMOR REMOVAL     right face    Family History No h/o GI disease or malignancy Family history of autoimmune disease-Brother Hashimoto's  Review of Systems  Constitutional:  Positive for unexpected weight change. Negative for activity change, appetite change, chills, diaphoresis, fatigue and fever.  HENT:  Negative for trouble swallowing and voice change.   Respiratory:  Negative for shortness of breath and wheezing.   Cardiovascular:  Negative for chest pain, palpitations and leg swelling.  Gastrointestinal:  Positive for blood in stool. Negative for abdominal distention, abdominal pain, anal bleeding, constipation, diarrhea, nausea, rectal pain and vomiting.  Musculoskeletal:  Negative for arthralgias and myalgias.  Skin:  Negative for color change and pallor.  Neurological:  Negative for dizziness, syncope and weakness.  Psychiatric/Behavioral:  Negative for confusion.   All other systems reviewed and are negative.    Medications No current facility-administered medications on file prior to encounter.   Current Outpatient Medications on File Prior to Encounter  Medication Sig Dispense Refill   acyclovir (ZOVIRAX) 200 MG capsule Take 200 mg by mouth 2 (two) times daily as needed (breakouts).     amLODipine (NORVASC) 5 MG tablet Take 5 mg  by mouth at bedtime.     carvedilol (COREG) 12.5 MG tablet Take 12.5 mg by mouth 2 (two) times daily with a meal.     cinacalcet (SENSIPAR) 60 MG tablet Take 60 mg by mouth at bedtime.     furosemide (LASIX) 80 MG tablet Take 80 mg by mouth.     HYDROcodone-acetaminophen (NORCO) 5-325 MG tablet Take 1 tablet by mouth every 6 (six) hours as needed for moderate pain or severe pain. 30 tablet 0   lanthanum (FOSRENOL) 1000 MG chewable tablet Chew 1,000 mg by mouth 3  (three) times daily with meals.     levothyroxine (SYNTHROID) 200 MCG tablet Take 200 mcg by mouth daily before breakfast.     losartan (COZAAR) 100 MG tablet Take 100 mg by mouth at bedtime.     ondansetron (ZOFRAN) 4 MG tablet Take 1 tablet (4 mg total) by mouth every 8 (eight) hours as needed for nausea or vomiting. 20 tablet 0   acetaminophen (TYLENOL) 500 MG tablet Take 1,000 mg by mouth every 6 (six) hours as needed for headache (pain).     diclofenac Sodium (VOLTAREN) 1 % GEL Apply 1 application. topically 4 (four) times daily as needed (pain).      Pertinent medications related to GI and procedure were reviewed by me with the patient prior to the procedure   Current Facility-Administered Medications:    0.9 %  sodium chloride infusion, , Intravenous, Continuous, Annamaria Helling, DO      Allergies  Allergen Reactions   Rituximab Anaphylaxis   Bupropion Other (See Comments)    Caused seizures   Ciprofloxacin Other (See Comments)    Caused Seizures    Fentanyl     Patient reports that with surgery she is difficult to wake up   Levetiracetam Other (See Comments)    Medical Record H&P (unknown reaction)   Oxycodone-Acetaminophen Nausea Only   Tramadol Other (See Comments)    Caused seizures   Allergies were reviewed by me prior to the procedure  Objective   Body mass index is 32.26 kg/m. Vitals:   01/30/22 1216  BP: (!) 155/98  Pulse: 82  Resp: 17  Temp: 97.6 F (36.4 C)  TempSrc: Temporal  SpO2: 100%  Weight: 93.4 kg  Height: '5\' 7"'$  (1.702 m)     Physical Exam Vitals and nursing note reviewed.  Constitutional:      General: She is not in acute distress.    Appearance: Normal appearance. She is not ill-appearing, toxic-appearing or diaphoretic.  HENT:     Head: Normocephalic and atraumatic.     Nose: Nose normal.     Mouth/Throat:     Mouth: Mucous membranes are moist.     Pharynx: Oropharynx is clear.  Eyes:     General: No scleral icterus.     Extraocular Movements: Extraocular movements intact.  Cardiovascular:     Rate and Rhythm: Normal rate and regular rhythm.     Heart sounds: Normal heart sounds. No murmur heard.    No friction rub. No gallop.  Pulmonary:     Effort: Pulmonary effort is normal. No respiratory distress.     Breath sounds: Normal breath sounds. No wheezing, rhonchi or rales.  Abdominal:     General: Bowel sounds are normal. There is no distension.     Palpations: Abdomen is soft.     Tenderness: There is no abdominal tenderness. There is no guarding or rebound.  Musculoskeletal:     Cervical back:  Neck supple.     Right lower leg: No edema.     Left lower leg: No edema.  Skin:    General: Skin is warm and dry.     Coloration: Skin is not jaundiced or pale.  Neurological:     General: No focal deficit present.     Mental Status: She is alert and oriented to person, place, and time. Mental status is at baseline.  Psychiatric:        Mood and Affect: Mood normal.        Behavior: Behavior normal.        Thought Content: Thought content normal.        Judgment: Judgment normal.      Assessment:  Ms. Holly [name] is a 36 y.o. female  who presents today for Esophagogastroduodenoscopy and Colonoscopy for Iron deficiency anemia, melena, bright red blood per rectum.  Plan:  Esophagogastroduodenoscopy and Colonoscopy with possible intervention today  Esophagogastroduodenoscopy and Colonoscopy with possible biopsy, control of bleeding, polypectomy, and interventions as necessary has been discussed with the patient/patient representative. Informed consent was obtained from the patient/patient representative after explaining the indication, nature, and risks of the procedure including but not limited to death, bleeding, perforation, missed neoplasm/lesions, cardiorespiratory compromise, and reaction to medications. Opportunity for questions was given and appropriate answers were provided. Patient/patient  representative has verbalized understanding is amenable to undergoing the procedure.   Annamaria Helling, DO  [facility]  Portions of the record may have been created with voice recognition software. Occasional wrong-word or 'sound-a-like' substitutions may have occurred due to the inherent limitations of voice recognition software.  Read the chart carefully and recognize, using context, where substitutions may have occurred.

## 2022-01-30 NOTE — Care Plan (Signed)
Patient's case was canceled. Pt noted to be hyperkalemia (7.4) and anemic (6.1) Contacting hospitalist service for admission.  Type and screen and blood transfusion has been ordered  Annamaria Helling, DO [facility]

## 2022-01-30 NOTE — Progress Notes (Signed)
Hemodialysis Post Treatment Note:  Tx date:01/30/22 Tx time:3hours Access:Right CVC UF Removed:0  Note:  Tolerated tx with no extra fluid removal.One unit packed red blood cells given, no adverse effects noted. CVC dressing changed. Last potassium draw=3.9(lab drawn last hour of tx), patient dialyzed against 1k bath for 2hours, then 2k last hour of treatment,tolerated well.

## 2022-01-30 NOTE — Care Plan (Signed)
Attempted to have the patient directly admitted to the hospital, however, hospitalist has recommended the patient go through the ED to be assessed and treated for her hyperkalemia and anemia as oppose to receiving treatment on the floor with admission.  Hospitalist reported that the patient would be able to be expedited through ED for treatment given her condition. I have called the EM provider who reports they will do they best they can as they have multiple people waiting in the Emergency department in triage.  Pt instructed to go from endoscopy to emergency department. Labs - mg, phos, bmp, and cbc and type and screen are drawn as well as unit of blood has been ordered.  Annamaria Helling, DO [facility]

## 2022-01-30 NOTE — ED Provider Notes (Signed)
[facility] Provider Note    Event Date/Time   First MD Initiated Contact with Patient 01/30/22 1510     (approximate)   History   Migraine   HPI  Holly [name] is a 36 y.o. female  with h/o ESRD on home HD daily, was prepping for colonoscopy due to ongoing GI blood loss yesterday. Did not do HD yesterday due to concern for heparin. Today, went in and labs were sent, which showed severe hyperkalemia and low Hgb, even w/ repeated labs. Reports she was drinking gatorade with her colon prep, of note. No CP, SOB, palpitations. Mild generalized weakness noted. No n/v. No syncope. Reports she has some n/v with her prep but since then has been fine. Had significant diarrhea as well w/ prep which is why she has been taking the Gatorade. Has had chronic anemia, intermittent transfusion chronically.       Physical Exam   Triage Vital Signs: ED Triage Vitals  Enc Vitals Group     BP 01/30/22 1525 (!) 152/78     Pulse Rate 01/30/22 1525 88     Resp 01/30/22 1525 17     Temp 01/30/22 1525 97.9 F (36.6 C)     Temp Source 01/30/22 1525 Oral     SpO2 01/30/22 1525 100 %     Weight 01/30/22 1526 206 lb (93.4 kg)     Height 01/30/22 1526 '5\' 7"'$  (1.702 m)     Head Circumference --      Peak Flow --      Pain Score 01/30/22 1526 3     Pain Loc --      Pain Edu? --      Excl. in [place]? --     Most recent vital signs: Vitals:   01/30/22 2200 01/30/22 2203  BP: (!) 148/86   Pulse: (!) 107 84  Resp: (!) 21 15  Temp: 98.5 F (36.9 C)   SpO2: 100% 99%     General: Awake, no distress.  CV:  Good peripheral perfusion. RRR, no murmurs. Resp:  Normal effort. Lungs CTAB. Abd:  No distention. No tenderness. Other:  No LE edema.    ED Results / Procedures / Treatments   Labs (all labs ordered are listed, but only abnormal results are displayed) Labs Reviewed  HEPATITIS B SURFACE ANTIBODY,QUALITATIVE - Abnormal; Notable for the following components:       Result Value   Hep B S Ab Reactive (*)    All other components within normal limits  POTASSIUM - Abnormal; Notable for the following components:   Potassium 6.2 (*)    All other components within normal limits  CBG MONITORING, ED - Abnormal; Notable for the following components:   Glucose-Capillary 105 (*)    All other components within normal limits  CBG MONITORING, ED - Abnormal; Notable for the following components:   Glucose-Capillary 112 (*)    All other components within normal limits  MRSA NEXT GEN BY PCR, NASAL  HEPATITIS B SURFACE ANTIGEN  HEPATITIS B CORE ANTIBODY, TOTAL  HEPATITIS C ANTIBODY  POTASSIUM  HEPATITIS B SURFACE ANTIBODY, QUANTITATIVE  CBC WITH DIFFERENTIAL/PLATELET  COMPREHENSIVE METABOLIC PANEL  MAGNESIUM  PHOSPHORUS  PREPARE RBC (CROSSMATCH)     EKG Normal sinus rhythm, VR 86. PR 145, QRS 90, QTc 448. No acute ST elevation or depressions. No ischemia or infarct.   RADIOLOGY CXR: Clear    I also independently reviewed and agree with radiologist interpretations.   PROCEDURES:  Critical Care performed: Yes, see critical care procedure note(s)  .Critical Care  Performed by: Duffy Bruce, MD Authorized by: Duffy Bruce, MD   Critical care provider statement:    Critical care time (minutes):  30   Critical care time was exclusive of:  Separately billable procedures and treating other patients   Critical care was necessary to treat or prevent imminent or life-threatening deterioration of the following conditions:  Cardiac failure, circulatory failure and respiratory failure   Critical care was time spent personally by me on the following activities:  Development of treatment plan with patient or surrogate, discussions with consultants, evaluation of patient's response to treatment, examination of patient, ordering and review of laboratory studies, ordering and review of radiographic studies, ordering and performing treatments and interventions,  pulse oximetry, re-evaluation of patient's condition and review of old charts     [place] ED: Medications  0.9 %  sodium chloride infusion (10 mL/hr Intravenous Not Given 01/30/22 2019)  Chlorhexidine Gluconate Cloth 2 % PADS 6 each (6 each Topical Given 01/30/22 1721)  pentafluoroprop-tetrafluoroeth (GEBAUERS) aerosol 1 application  (has no administration in time range)  lidocaine (PF) (XYLOCAINE) 1 % injection 5 mL (has no administration in time range)  lidocaine-prilocaine (EMLA) cream 1 application  (has no administration in time range)  heparin injection 1,000 Units (1,000 Units Dialysis Given 01/30/22 2052)  alteplase (CATHFLO ACTIVASE) injection 2 mg (has no administration in time range)  acetaminophen (TYLENOL) tablet 650 mg (650 mg Oral Given 01/30/22 2235)  polyethylene glycol (MIRALAX / GLYCOLAX) packet 17 g (has no administration in time range)  ondansetron (ZOFRAN) injection 4 mg (has no administration in time range)  melatonin tablet 5 mg (has no administration in time range)  levothyroxine (SYNTHROID) tablet 200 mcg (has no administration in time range)  sodium zirconium cyclosilicate (LOKELMA) packet 10 g (10 g Oral Not Given 01/30/22 2026)  carvedilol (COREG) tablet 12.5 mg (has no administration in time range)  insulin aspart (novoLOG) injection 5 Units (5 Units Intravenous Given 01/30/22 1547)  dextrose 50 % solution 50 mL (50 mLs Intravenous Given 01/30/22 1545)  albuterol (PROVENTIL) (2.5 MG/3ML) 0.083% nebulizer solution 5 mg (5 mg Nebulization Given 01/30/22 1549)  calcium gluconate 1 g/ 50 mL sodium chloride IVPB (0 mg Intravenous Stopped 01/30/22 1737)  sodium bicarbonate injection 50 mEq (50 mEq Intravenous Given 01/30/22 1631)     IMPRESSION / MDM / [place] / ED COURSE  I reviewed the triage vital signs and the nursing notes.                               The patient is on the cardiac monitor to evaluate for evidence of arrhythmia and/or  significant heart rate changes.   Ddx:  Differential includes the following, with pertinent life- or limb-threatening emergencies considered:  Hyperkalemia from end-stage renal disease and drinking Gatorade, symptomatic anemia, lab error, medication effect  Patient's presentation is most consistent with acute presentation with potential threat to life or bodily function.  MDM:  36 year old female here with profound hyperkalemia and anemia.  Regarding her hyperkalemia, patient reportedly has been drinking Gatorade after her colonoscopy prep, which combined with not doing her dialysis session yesterday as she had been told to, has likely led to her significant hyperkalemia.  EKG shows slightly peaked T waves, but intervals are otherwise normal.  Discussed the case with Dr. Holley Raring of nephrology.  Patient given temporization  measures here including IV insulin, albuterol, and bicarb.  Calcium given as well.  She will go to emergent hemodialysis followed by medical admission.  Regarding her anemia, she does have chronic anemia, but GI has requested transfusion which I think is reasonable in the setting of her possible GI bleeding.  She appears hemodynamically stable at this time.   MEDICATIONS GIVEN IN ED: Medications  0.9 %  sodium chloride infusion (10 mL/hr Intravenous Not Given 01/30/22 2019)  Chlorhexidine Gluconate Cloth 2 % PADS 6 each (6 each Topical Given 01/30/22 1721)  pentafluoroprop-tetrafluoroeth (GEBAUERS) aerosol 1 application  (has no administration in time range)  lidocaine (PF) (XYLOCAINE) 1 % injection 5 mL (has no administration in time range)  lidocaine-prilocaine (EMLA) cream 1 application  (has no administration in time range)  heparin injection 1,000 Units (1,000 Units Dialysis Given 01/30/22 2052)  alteplase (CATHFLO ACTIVASE) injection 2 mg (has no administration in time range)  acetaminophen (TYLENOL) tablet 650 mg (650 mg Oral Given 01/30/22 2235)  polyethylene glycol (MIRALAX  / GLYCOLAX) packet 17 g (has no administration in time range)  ondansetron (ZOFRAN) injection 4 mg (has no administration in time range)  melatonin tablet 5 mg (has no administration in time range)  levothyroxine (SYNTHROID) tablet 200 mcg (has no administration in time range)  sodium zirconium cyclosilicate (LOKELMA) packet 10 g (10 g Oral Not Given 01/30/22 2026)  carvedilol (COREG) tablet 12.5 mg (has no administration in time range)  insulin aspart (novoLOG) injection 5 Units (5 Units Intravenous Given 01/30/22 1547)  dextrose 50 % solution 50 mL (50 mLs Intravenous Given 01/30/22 1545)  albuterol (PROVENTIL) (2.5 MG/3ML) 0.083% nebulizer solution 5 mg (5 mg Nebulization Given 01/30/22 1549)  calcium gluconate 1 g/ 50 mL sodium chloride IVPB (0 mg Intravenous Stopped 01/30/22 1737)  sodium bicarbonate injection 50 mEq (50 mEq Intravenous Given 01/30/22 1631)     Consults:  Dr. Holley Raring, nephrology Hospitalist   EMR reviewed  Reviewed GI notes, including notes from visit at the procedural center today, as well as reviewed labs from this visit.  Reviewed previous nephrology notes and admission notes.     FINAL CLINICAL IMPRESSION(S) / ED DIAGNOSES   Final diagnoses:  Hyperkalemia  ESRD on dialysis ([place])  Acute on chronic anemia     Rx / DC Orders   ED Discharge Orders     None        Note:  This document was prepared using Dragon voice recognition software and may include unintentional dictation errors.   Duffy Bruce, MD 01/30/22 [phone]

## 2022-01-30 NOTE — ED Triage Notes (Signed)
Pt to er room number 17 from endoscopy, states that she was scheduled to have an endoscopy but her potassium was elevated and her h/h was low, states that she has had 12 blood transfusions in the past year.  Pt states that she doesn't normally feel poorly until her heme is 5 and she isn't normally over 7.  Pt on heart monitor in sinus rhythm, pt answering questions appropriately.

## 2022-01-30 NOTE — H&P (Signed)
History and Physical  Holly [name] EPP:[id number] DOB: [birth year] DOA: 01/30/2022  Referring physician: Dr. Ellender Hose, [name]  PCP: Pcp, No  Outpatient Specialists: Nephrology. Patient coming from: Home from outpatient endoscopy suite.  Chief Complaint: Hyperkalemia  HPI: Holly [name] is a 36 y.o. female with medical history significant for ESRD on hemodialysis at home every day (These sessions are shorter, allowing her to keep working.  She works,12 hours shifts), history of GI bleed, hypertension, hypothyroidism, menorrhagia, who initially presented to outpatient endoscopy suite for EGD and colonoscopy due to concern for GI bleed.  While at the outpatient endoscopy suite, as part of routine preop labs, she was found to have a potassium of 7.4, hemoglobin of 6.1.  No melena or hematochezia noted today.  The patient endorses drinking 2 large bottles of Gatorade in addition to her bowel prep.  Dr. [name] requested direct admission, however due to hemodynamic instability the patient was sent to the ED for stabilization.  Nephrology was consulted by EDP.  Plan for hemodialysis today due to severe hyperkalemia.  No chest pain.    Last hemodialysis was on Tuesday evening 01/28/22 at home.  Has a left upper extremity AV fistula.  No abdominal pain.  No reported subjective fevers or chills.  No use of NSAIDs.  2 units PRBCs were ordered to be transfused by EDP.  TRH, hospitalist service, was asked to admit.  ED Course: Tmax 98.6.  BP 153/93.  Pulse 96.  Respiratory 19, saturation 100% on room air.  Lab studies remarkable for 132, potassium 7.4, BUN 33, creatinine 11.78, phosphorus 10.0, magnesium 2.5, GFR 4.  WBC 5.3, hemoglobin 6.6.  19.5, MCV 92.4.  Pregnancy test negative.  Review of Systems: Review of systems as noted in the HPI. All other systems reviewed and are negative.   Past Medical History:  Diagnosis Date   Anemia    Anxiety    Arthritis    hands   Cancer ([place])    Complication of  anesthesia    with Fentanyl she is difficult to awake   Depression    Dialysis patient ([name])    Dyspnea    due to anemia   Epilepsy ([place])    Hashimoto's disease    History of kidney stones    HLD (hyperlipidemia)    Hypertension    Hypothyroidism    Pneumonia    Renal disorder    Thyroid disease    Past Surgical History:  Procedure Laterality Date   ABDOMINAL SURGERY     AV FISTULA PLACEMENT Left 01/17/2022   Procedure: ARTERIOVENOUS (AV) FISTULA CREATION ( RADIAL CEPHALIC);  Surgeon: Katha Cabal, MD;  Location: [facility] ORS;  Service: Vascular;  Laterality: Left;   CERVICAL BIOPSY  W/ LOOP ELECTRODE EXCISION  2018   DIAGNOSTIC LAPAROSCOPY     due to removal of PD catheters   RENAL BIOPSY     x 2   TUMOR REMOVAL     right face    Social History:  reports that she has been smoking cigarettes. She has never used smokeless tobacco. She reports that she does not drink alcohol and does not use drugs.   Allergies  Allergen Reactions   Rituximab Anaphylaxis   Bupropion Other (See Comments)    Caused seizures   Ciprofloxacin Other (See Comments)    Caused Seizures    Fentanyl     Patient reports that with surgery she is difficult to wake up   Levetiracetam Other (See Comments)  Medical Record H&P (unknown reaction)   Oxycodone-Acetaminophen Nausea Only   Tramadol Other (See Comments)    Caused seizures    Family History  Problem Relation Age of Onset   Multiple sclerosis Mother    Cancer Father    COPD Father       Prior to Admission medications   Medication Sig Start Date End Date Taking? Authorizing Provider  acetaminophen (TYLENOL) 500 MG tablet Take 1,000 mg by mouth every 6 (six) hours as needed for headache (pain).    [provider]  acyclovir (ZOVIRAX) 200 MG capsule Take 200 mg by mouth 2 (two) times daily as needed (breakouts). 08/14/21   [provider]  amLODipine (NORVASC) 5 MG tablet Take 5 mg by mouth at bedtime. 11/05/21    [provider]  carvedilol (COREG) 12.5 MG tablet Take 12.5 mg by mouth 2 (two) times daily with a meal.    [provider]  cinacalcet (SENSIPAR) 60 MG tablet Take 60 mg by mouth at bedtime.    [provider]  diclofenac Sodium (VOLTAREN) 1 % GEL Apply 1 application. topically 4 (four) times daily as needed (pain).    [provider]  furosemide (LASIX) 80 MG tablet Take 80 mg by mouth.    [provider]  HYDROcodone-acetaminophen (NORCO) 5-325 MG tablet Take 1 tablet by mouth every 6 (six) hours as needed for moderate pain or severe pain. 01/17/22   Schnier, Dolores Lory, MD  lanthanum (FOSRENOL) 1000 MG chewable tablet Chew 1,000 mg by mouth 3 (three) times daily with meals. 08/15/21   [provider]  levothyroxine (SYNTHROID) 200 MCG tablet Take 200 mcg by mouth daily before breakfast.    [provider]  losartan (COZAAR) 100 MG tablet Take 100 mg by mouth at bedtime.    [provider]  ondansetron (ZOFRAN) 4 MG tablet Take 1 tablet (4 mg total) by mouth every 8 (eight) hours as needed for nausea or vomiting. 01/17/22   Schnier, Dolores Lory, MD    Physical Exam: BP 132/88 (BP Location: Right Arm)   Pulse 89   Temp 98.4 F (36.9 C) (Oral)   Resp 16   Ht '5\' 7"'$  (1.702 m)   Wt 93.4 kg   LMP 01/12/2022 Comment: Negative test 01/17/2022  SpO2 100%   BMI 32.26 kg/m   General: 36 y.o. year-old female well developed well nourished in no acute distress.  Alert and oriented x3. Cardiovascular: Regular rate and rhythm with no rubs or gallops.  No thyromegaly or JVD noted.  No lower extremity edema. 2/4 pulses in all 4 extremities. Respiratory: Clear to auscultation with no wheezes or rales. Good inspiratory effort. Abdomen: Soft nontender nondistended with normal bowel sounds x4 quadrants. Muskuloskeletal: No cyanosis, clubbing or edema noted bilaterally Neuro: CN II-XII intact, strength, sensation, reflexes Skin: No  ulcerative lesions noted or rashes Psychiatry: Judgement and insight appear normal. Mood is appropriate for condition and setting          Labs on Admission:  Basic Metabolic Panel: Recent Labs  Lab 01/30/22 1253 01/30/22 1305 01/30/22 1409  NA 130*  --  132*  K 6.5* 7.4* 7.4*  CL 95*  --  98  CO2  --   --  24  GLUCOSE 77  --  81  BUN 54*  --  53*  CREATININE 13.80*  --  11.78*  CALCIUM  --   --  9.5  MG  --   --  2.5*  PHOS  --   --  10.0*   Liver Function Tests: No results for input(s): "AST", "ALT", "ALKPHOS", "BILITOT", "PROT", "ALBUMIN" in the last 168 hours. No results for input(s): "LIPASE", "AMYLASE" in the last 168 hours. No results for input(s): "AMMONIA" in the last 168 hours. CBC: Recent Labs  Lab 01/30/22 1253 01/30/22 1409  WBC  --  5.3  HGB 6.1* 6.6*  HCT 18.0* 19.5*  MCV  --  92.4  PLT  --  181   Cardiac Enzymes: No results for input(s): "CKTOTAL", "CKMB", "CKMBINDEX", "TROPONINI" in the last 168 hours.  BNP (last 3 results) Recent Labs    11/08/21 0636  BNP 314.3*    ProBNP (last 3 results) No results for input(s): "PROBNP" in the last 8760 hours.  CBG: Recent Labs  Lab 01/30/22 1544 01/30/22 1705  GLUCAP 105* 112*    Radiological Exams on Admission: No results found.  EKG: I independently viewed the EKG done and my findings are as followed: Peaked T waves seen on 12 lead EKG, sinus rhythm 86, QTc 448  Assessment/Plan Present on Admission:  Hyperkalemia  Principal Problem:   Hyperkalemia  Hyperkalemia likely secondary to diet non adherence Reports drinking 2 large bottles of Gatorade when she was going through her bowel prep.  The bowel prep was making her nauseous and she vomited Presented with serum potassium of 7.4 and peaked T waves Taken to hemodialysis on 01/30/2022, appreciate nephrology's assistance Last hemodialysis was on Tuesday, 01/28/2022 at home. Repeat renal function panel post hemodialysis  ESRD on hemodialysis  at home Per the patient she does not have PD but does hemodialysis at home Management per nephrology  Mild hypervolemic hyponatremia Hyperphosphatemia Hypermagnesemia Serum sodium 132 Electrolytes and volume status managed with hemodialysis Appreciate nephrology's  Acute on chronic blood loss anemia Endorses menorrhagia No use of NSAIDs Was in the process of getting EGD and colonoscopy due to concern for GI bleed 2 units PRBCs ordered to be transfused by EDP Maintain hemoglobin greater than 7.0 Repeat CBC posttransfusion Rest of management per GI  Hypertension, BP is not at goal, elevated Resume home antihypertensives Monitor vital signs  Hypothyroidism Resume home levothyroxine   Critical care time: 60 Minutes   DVT prophylaxis: SCDs, chemical DVT prophylaxis held due to concern for GI bleeding.  Code Status: Full code  Family Communication: None at bedside  Disposition Plan: Admitted to stepdown unit  Consults called: Nephrology, GI  Admission status: Inpatient status.   Status is: Inpatient The patient requires at least 2 midnight for further evaluation and treatment of present condition.   Kayleen Memos MD [facility] Pager [phone]  If 7PM-7AM, please contact night-coverage [email or web address] Password [facility]  01/30/2022, 5:13 PM

## 2022-01-30 NOTE — ED Notes (Signed)
ED TO INPATIENT HANDOFF REPORT  ED Nurse Name and Phone #: [phone] Name/Age/Gender Holly [name] 36 y.o. female Room/Bed: ED17A/ED17A  Code Status   Code Status: Full Code  Home/SNF/Other Home Patient oriented to: self, place, time, and situation Is this baseline? Yes   Triage Complete: Triage complete  Chief Complaint Hyperkalemia [E87.5]  Triage Note Pt to er room number 17 from endoscopy, states that she was scheduled to have an endoscopy but her potassium was elevated and her h/h was low, states that she has had 12 blood transfusions in the past year.  Pt states that she doesn't normally feel poorly until her heme is 5 and she isn't normally over 7.  Pt on heart monitor in sinus rhythm, pt answering questions appropriately.     Allergies Allergies  Allergen Reactions   Rituximab Anaphylaxis   Bupropion Other (See Comments)    Caused seizures   Ciprofloxacin Other (See Comments)    Caused Seizures    Fentanyl     Patient reports that with surgery she is difficult to wake up   Levetiracetam Other (See Comments)    Medical Record H&P (unknown reaction)   Oxycodone-Acetaminophen Nausea Only   Tramadol Other (See Comments)    Caused seizures    Level of Care/Admitting Diagnosis ED Disposition     ED Disposition  Admit   Condition  --   [place]: [place] [[id number]]  Level of Care: Stepdown [14]  Covid Evaluation: Asymptomatic - no recent exposure (last 10 days) testing not required  Diagnosis: Hyperkalemia [128786]  Admitting Physician: Kayleen Memos [[id number]]  Attending Physician: Kayleen Memos [phone]  Estimated length of stay: past midnight tomorrow  Certification:: I certify this patient will need inpatient services for at least 2 midnights          B Medical/Surgery History Past Medical History:  Diagnosis Date   Anemia    Anxiety    Arthritis    hands   Cancer ([place])    Complication of  anesthesia    with Fentanyl she is difficult to awake   Depression    Dialysis patient ([place])    Dyspnea    due to anemia   Epilepsy ([place])    Hashimoto's disease    History of kidney stones    HLD (hyperlipidemia)    Hypertension    Hypothyroidism    Pneumonia    Renal disorder    Thyroid disease    Past Surgical History:  Procedure Laterality Date   ABDOMINAL SURGERY     AV FISTULA PLACEMENT Left 01/17/2022   Procedure: ARTERIOVENOUS (AV) FISTULA CREATION ( RADIAL CEPHALIC);  Surgeon: Katha Cabal, MD;  Location: [facility] ORS;  Service: Vascular;  Laterality: Left;   CERVICAL BIOPSY  W/ LOOP ELECTRODE EXCISION  2018   DIAGNOSTIC LAPAROSCOPY     due to removal of PD catheters   RENAL BIOPSY     x 2   TUMOR REMOVAL     right face     A IV Location/Drains/Wounds Patient Lines/Drains/Airways Status     Active Line/Drains/Airways     Name Placement date Placement time Site Days   Peripheral IV 01/30/22 22 G Anterior;Right Hand 01/30/22  1255  Hand  less than 1   Peripheral IV 01/30/22 20 G Anterior;Right Forearm 01/30/22  1630  Forearm  less than 1   Fistula / Graft Left Forearm 01/17/22  --  Forearm  13  Hemodialysis Catheter Right Subclavian 11/09/21  0000  Subclavian  82   Airway 01/17/22  1052  -- 13   Incision (Closed) 01/17/22 Arm Left 01/17/22  1229  -- 13            Intake/Output Last 24 hours No intake or output data in the 24 hours ending 01/30/22 1636  Labs/Imaging Results for orders placed or performed during the hospital encounter of 01/30/22 (from the past 48 hour(s))  Prepare RBC (crossmatch)     Status: None (Preliminary result)   Collection Time: 01/30/22  3:28 PM  Result Value Ref Range   Order Confirmation PENDING   POC CBG, ED     Status: Abnormal   Collection Time: 01/30/22  3:44 PM  Result Value Ref Range   Glucose-Capillary 105 (H) 70 - 99 mg/dL    Comment: Glucose reference range applies only to samples taken after fasting for at  least 8 hours.   No results found.  Pending Labs Unresulted Labs (From admission, onward)     Start     Ordered   01/30/22 1543  Hepatitis B surface antigen  (New Admission Hemo Labs (Hepatitis B))  Once,   URGENT        01/30/22 1544   01/30/22 1543  Hepatitis B surface antibody  (New Admission Hemo Labs (Hepatitis B))  Once,   URGENT        01/30/22 1544   01/30/22 1543  Hepatitis B surface antibody,quantitative  (New Admission Hemo Labs (Hepatitis B))  Once,   URGENT        01/30/22 1544   01/30/22 1543  Hepatitis B core antibody, total  (New Admission Hemo Labs (Hepatitis B))  Once,   URGENT        01/30/22 1544   01/30/22 1543  Hepatitis C antibody  (New Admission Hemo Labs (Hepatitis B))  Once,   URGENT        01/30/22 1544            Vitals/Pain Today's Vitals   01/30/22 1525 01/30/22 1526 01/30/22 1530 01/30/22 1600  BP: (!) 152/78  140/84 125/65  Pulse: 88  88 88  Resp: 17  16 (!) 25  Temp: 97.9 F (36.6 C)     TempSrc: Oral     SpO2: 100%  100% 100%  Weight:  206 lb (93.4 kg)    Height:  '5\' 7"'$  (1.702 m)    PainSc:  3       Isolation Precautions No active isolations  Medications Medications  0.9 %  sodium chloride infusion (has no administration in time range)  calcium gluconate 1 g/ 50 mL sodium chloride IVPB (1,000 mg Intravenous New Bag/Given 01/30/22 1635)  Chlorhexidine Gluconate Cloth 2 % PADS 6 each (has no administration in time range)  insulin aspart (novoLOG) injection 5 Units (5 Units Intravenous Given 01/30/22 1547)  dextrose 50 % solution 50 mL (50 mLs Intravenous Given 01/30/22 1545)  albuterol (PROVENTIL) (2.5 MG/3ML) 0.083% nebulizer solution 5 mg (5 mg Nebulization Given 01/30/22 1549)  sodium bicarbonate injection 50 mEq (50 mEq Intravenous Given 01/30/22 1631)    Mobility walks Low fall risk   Focused Assessments    R Recommendations: See Admitting Provider Note  Report given to:   Additional Notes: no L arm procedures, dialysis  cath in chest.

## 2022-01-30 NOTE — Interval H&P Note (Signed)
History and Physical Interval Note: Preprocedure H&P from 01/30/22  was reviewed and there was no interval change after seeing and examining the patient.  Written consent was obtained from the patient after discussion of risks, benefits, and alternatives. Patient has consented to proceed with Esophagogastroduodenoscopy and Colonoscopy with possible intervention   01/30/2022 12:49 PM  Holly [name]  has presented today for surgery, with the diagnosis of Iron deficiency anemia due to chronic blood loss (D50.0).  The various methods of treatment have been discussed with the patient and family. After consideration of risks, benefits and other options for treatment, the patient has consented to  Procedure(s): COLONOSCOPY (N/A) ESOPHAGOGASTRODUODENOSCOPY (EGD) (N/A) as a surgical intervention.  The patient's history has been reviewed, patient examined, no change in status, stable for surgery.  I have reviewed the patient's chart and labs.  Questions were answered to the patient's satisfaction.     Annamaria Helling

## 2022-01-30 NOTE — OR Nursing (Signed)
SERUM POTASSIUM 7.4 PER LAB. DR RUSSO NOTIFIED. PT TO BE ADMITTED TO HOSPITAL

## 2022-01-31 ENCOUNTER — Encounter: Payer: Self-pay | Admitting: Gastroenterology

## 2022-01-31 DIAGNOSIS — I10 Essential (primary) hypertension: Secondary | ICD-10-CM

## 2022-01-31 DIAGNOSIS — E875 Hyperkalemia: Secondary | ICD-10-CM | POA: Diagnosis not present

## 2022-01-31 DIAGNOSIS — Z992 Dependence on renal dialysis: Secondary | ICD-10-CM

## 2022-01-31 DIAGNOSIS — E039 Hypothyroidism, unspecified: Secondary | ICD-10-CM

## 2022-01-31 DIAGNOSIS — N186 End stage renal disease: Secondary | ICD-10-CM

## 2022-01-31 DIAGNOSIS — D638 Anemia in other chronic diseases classified elsewhere: Secondary | ICD-10-CM | POA: Diagnosis not present

## 2022-01-31 DIAGNOSIS — E871 Hypo-osmolality and hyponatremia: Secondary | ICD-10-CM

## 2022-01-31 LAB — CBC WITH DIFFERENTIAL/PLATELET
Abs Immature Granulocytes: 0.01 10*3/uL (ref 0.00–0.07)
Basophils Absolute: 0 10*3/uL (ref 0.0–0.1)
Basophils Relative: 1 %
Eosinophils Absolute: 0.2 10*3/uL (ref 0.0–0.5)
Eosinophils Relative: 4 %
HCT: 19.2 % — ABNORMAL LOW (ref 36.0–46.0)
Hemoglobin: 6.7 g/dL — ABNORMAL LOW (ref 12.0–15.0)
Immature Granulocytes: 0 %
Lymphocytes Relative: 27 %
Lymphs Abs: 1 10*3/uL (ref 0.7–4.0)
MCH: 31.2 pg (ref 26.0–34.0)
MCHC: 34.9 g/dL (ref 30.0–36.0)
MCV: 89.3 fL (ref 80.0–100.0)
Monocytes Absolute: 0.4 10*3/uL (ref 0.1–1.0)
Monocytes Relative: 10 %
Neutro Abs: 2.2 10*3/uL (ref 1.7–7.7)
Neutrophils Relative %: 58 %
Platelets: 154 10*3/uL (ref 150–400)
RBC: 2.15 MIL/uL — ABNORMAL LOW (ref 3.87–5.11)
RDW: 14.1 % (ref 11.5–15.5)
WBC: 3.8 10*3/uL — ABNORMAL LOW (ref 4.0–10.5)
nRBC: 0 % (ref 0.0–0.2)

## 2022-01-31 LAB — COMPREHENSIVE METABOLIC PANEL
ALT: 8 U/L (ref 0–44)
AST: 11 U/L — ABNORMAL LOW (ref 15–41)
Albumin: 3.4 g/dL — ABNORMAL LOW (ref 3.5–5.0)
Alkaline Phosphatase: 49 U/L (ref 38–126)
Anion gap: 5 (ref 5–15)
BUN: 25 mg/dL — ABNORMAL HIGH (ref 6–20)
CO2: 28 mmol/L (ref 22–32)
Calcium: 8.8 mg/dL — ABNORMAL LOW (ref 8.9–10.3)
Chloride: 100 mmol/L (ref 98–111)
Creatinine, Ser: 7.42 mg/dL — ABNORMAL HIGH (ref 0.44–1.00)
GFR, Estimated: 7 mL/min — ABNORMAL LOW (ref >60)
Glucose, Bld: 77 mg/dL (ref 70–99)
Potassium: 5.3 mmol/L — ABNORMAL HIGH (ref 3.5–5.1)
Sodium: 133 mmol/L — ABNORMAL LOW (ref 135–145)
Total Bilirubin: 0.7 mg/dL (ref 0.3–1.2)
Total Protein: 6 g/dL — ABNORMAL LOW (ref 6.5–8.1)

## 2022-01-31 LAB — POTASSIUM
Potassium: 5.8 mmol/L — ABNORMAL HIGH (ref 3.5–5.1)
Potassium: 5.7 mmol/L — ABNORMAL HIGH (ref 3.5–5.1)
Potassium: 5.9 mmol/L — ABNORMAL HIGH (ref 3.5–5.1)

## 2022-01-31 LAB — PHOSPHORUS: Phosphorus: 6.9 mg/dL — ABNORMAL HIGH (ref 2.5–4.6)

## 2022-01-31 LAB — HEMOGLOBIN AND HEMATOCRIT, BLOOD
HCT: 23.1 % — ABNORMAL LOW (ref 36.0–46.0)
Hemoglobin: 8 g/dL — ABNORMAL LOW (ref 12.0–15.0)

## 2022-01-31 LAB — FERRITIN: Ferritin: 593 ng/mL — ABNORMAL HIGH (ref 11–307)

## 2022-01-31 LAB — RETIC PANEL
Immature Retic Fract: 9.2 % (ref 2.3–15.9)
RBC.: 2.15 MIL/uL — ABNORMAL LOW (ref 3.87–5.11)
Retic Count, Absolute: 51.2 10*3/uL (ref 19.0–186.0)
Retic Ct Pct: 2.4 % (ref 0.4–3.1)
Reticulocyte Hemoglobin: 34.5 pg (ref >27.9)

## 2022-01-31 LAB — IRON AND TIBC
Iron: 54 ug/dL (ref 28–170)
Saturation Ratios: 23 % (ref 10.4–31.8)
TIBC: 234 ug/dL — ABNORMAL LOW (ref 250–450)
UIBC: 180 ug/dL

## 2022-01-31 LAB — MAGNESIUM: Magnesium: 2 mg/dL (ref 1.7–2.4)

## 2022-01-31 LAB — PREPARE RBC (CROSSMATCH)

## 2022-01-31 LAB — GLUCOSE, CAPILLARY: Glucose-Capillary: 101 mg/dL — ABNORMAL HIGH (ref 70–99)

## 2022-01-31 MED ORDER — SODIUM ZIRCONIUM CYCLOSILICATE 5 G PO PACK
10.0000 g | PACK | Freq: Every day | ORAL | Status: AC
  Administered 2022-01-31 – 2022-02-01 (×2): 10 g via ORAL
  Filled 2022-01-31 (×2): qty 2

## 2022-01-31 MED ORDER — GABAPENTIN 100 MG PO CAPS
100.0000 mg | ORAL_CAPSULE | Freq: Three times a day (TID) | ORAL | Status: DC
  Administered 2022-01-31 – 2022-02-02 (×4): 100 mg via ORAL
  Filled 2022-01-31 (×5): qty 1

## 2022-01-31 MED ORDER — LANTHANUM CARBONATE 500 MG PO CHEW
1000.0000 mg | CHEWABLE_TABLET | Freq: Three times a day (TID) | ORAL | Status: DC
Start: 2022-01-31 — End: 2022-02-02
  Administered 2022-02-01 – 2022-02-02 (×3): 1000 mg via ORAL
  Filled 2022-01-31 (×7): qty 2

## 2022-01-31 MED ORDER — AMLODIPINE BESYLATE 5 MG PO TABS
5.0000 mg | ORAL_TABLET | Freq: Every day | ORAL | Status: DC
  Administered 2022-01-31: 5 mg via ORAL
  Filled 2022-01-31: qty 1

## 2022-01-31 MED ORDER — ACETAMINOPHEN 325 MG PO TABS
650.0000 mg | ORAL_TABLET | Freq: Once | ORAL | Status: AC
  Administered 2022-01-31: 650 mg via ORAL
  Filled 2022-01-31: qty 2

## 2022-01-31 MED ORDER — FUROSEMIDE 10 MG/ML IJ SOLN
80.0000 mg | Freq: Once | INTRAMUSCULAR | Status: AC
  Administered 2022-01-31: 80 mg via INTRAVENOUS
  Filled 2022-01-31: qty 8

## 2022-01-31 MED ORDER — DICLOFENAC SODIUM 1 % EX GEL
1.0000 | Freq: Four times a day (QID) | CUTANEOUS | Status: DC | PRN
Start: 2022-01-31 — End: 2022-02-02

## 2022-01-31 MED ORDER — SODIUM CHLORIDE 0.9% IV SOLUTION: Freq: Once | INTRAVENOUS | Status: AC

## 2022-01-31 MED ORDER — PEG 3350-KCL-NA BICARB-NACL 420 G PO SOLR
4000.0000 mL | Freq: Once | ORAL | Status: AC
Start: 2022-01-31 — End: 2022-01-31
  Administered 2022-01-31: 4000 mL via ORAL
  Filled 2022-01-31: qty 4000

## 2022-01-31 MED ORDER — CINACALCET HCL 30 MG PO TABS
60.0000 mg | ORAL_TABLET | Freq: Every day | ORAL | Status: DC
Start: 2022-01-31 — End: 2022-02-02
  Administered 2022-02-01: 60 mg via ORAL
  Filled 2022-01-31 (×3): qty 2

## 2022-01-31 NOTE — Consult Note (Signed)
Consultation  Referring Provider:     Dr Nevada Crane Admit date 12/30/21 Consult date        12/31/21 Reason for Consultation:     ida/rectal bleeding/acute on chronic anemia         HPI:   Holly [name] is a 36 y.o. 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 Was to have egd/colonoscopy yesterday for same however was noted to have acute anemia exacerbation of hgb 6.1- was admitted for transfusion/monitoring. Received 2 prbcs- was also noted to have hyperkalemia and nephrology consulted/had dialysis yesterday. States she is feeling ok today- still has some lightheadness. Saw a small amount of brpr this am with her bowel movement. Denies any abdominal pain, dyspepsia/dysphagia, melena, NV, further Gi concerns at present. Note her potassium is normal today, last hgb 6.7- to be repeated soon. Denies  nsaids. Note she has been following with gyn for menorrhagia however has seen rectal bleeding outside of menstrual cycle. Denies nsaids.  PREVIOUS ENDOSCOPIES:            Egd/colonoscopy done in [place] some years ago - gastroparesis, normal colon   Past Medical History:  Diagnosis Date   Anemia    Anxiety    Arthritis    hands   Cancer ([place])    Complication of anesthesia    with Fentanyl she is difficult to awake   Depression    Dialysis patient ([place])    Dyspnea    due to anemia   Epilepsy ([place])    Hashimoto's disease    History of kidney stones    HLD (hyperlipidemia)    Hypertension    Hypothyroidism    Pneumonia    Renal disorder    Thyroid disease     Past Surgical History:  Procedure Laterality Date   ABDOMINAL SURGERY     AV FISTULA PLACEMENT Left 01/17/2022   Procedure: ARTERIOVENOUS (AV) FISTULA CREATION ( RADIAL CEPHALIC);  Surgeon: Katha Cabal, MD;  Location: [facility] ORS;  Service: Vascular;  Laterality:  Left;   CERVICAL BIOPSY  W/ LOOP ELECTRODE EXCISION  2018   COLONOSCOPY N/A 01/30/2022   Procedure: COLONOSCOPY;  Surgeon: Annamaria Helling, DO;  Location: [facility] ENDOSCOPY;  Service: Gastroenterology;  Laterality: N/A;   DIAGNOSTIC LAPAROSCOPY     due to removal of PD catheters   ESOPHAGOGASTRODUODENOSCOPY N/A 01/30/2022   Procedure: ESOPHAGOGASTRODUODENOSCOPY (EGD);  Surgeon: Annamaria Helling, DO;  Location: [facility] ENDOSCOPY;  Service: Gastroenterology;  Laterality: N/A;   RENAL BIOPSY     x 2   TUMOR REMOVAL     right face    Family History  Problem Relation Age of Onset   Multiple sclerosis Mother    Cancer Father    COPD Father      Social History   Tobacco Use   Smoking status: Some Days    Types: Cigarettes   Smokeless tobacco: Never  Vaping Use   Vaping Use: Never used  Substance Use Topics   Alcohol use: Never   Drug use: Never    Prior to Admission medications   Medication Sig Start Date End Date Taking? Authorizing Provider  amLODipine (NORVASC) 5 MG tablet Take 5 mg by mouth at bedtime. 11/05/21  Yes [provider]  carvedilol (COREG) 12.5 MG tablet Take 12.5 mg by mouth 2 (two) times daily with a meal.  Yes [provider]  cinacalcet (SENSIPAR) 60 MG tablet Take 60 mg by mouth at bedtime.   Yes [provider]  levothyroxine (SYNTHROID) 200 MCG tablet Take 200 mcg by mouth daily before breakfast.   Yes [provider]  losartan (COZAAR) 100 MG tablet Take 100 mg by mouth at bedtime.   Yes [provider]  ondansetron (ZOFRAN) 4 MG tablet Take 1 tablet (4 mg total) by mouth every 8 (eight) hours as needed for nausea or vomiting. 01/17/22  Yes Schnier, Dolores Lory, MD  acetaminophen (TYLENOL) 500 MG tablet Take 1,000 mg by mouth every 6 (six) hours as needed for headache (pain).    [provider]  acyclovir (ZOVIRAX) 200 MG capsule Take 200 mg by mouth 2 (two) times daily as needed (breakouts). 08/14/21    [provider]  diclofenac Sodium (VOLTAREN) 1 % GEL Apply 1 application. topically 4 (four) times daily as needed (pain).    [provider]  furosemide (LASIX) 80 MG tablet Take 80 mg by mouth.    [provider]  gabapentin (NEURONTIN) 100 MG capsule Take 100 mg by mouth 3 (three) times daily. 01/15/22   [provider]  HYDROcodone-acetaminophen (NORCO) 5-325 MG tablet Take 1 tablet by mouth every 6 (six) hours as needed for moderate pain or severe pain. 01/17/22   Schnier, Dolores Lory, MD  lanthanum (FOSRENOL) 1000 MG chewable tablet Chew 1,000 mg by mouth 3 (three) times daily with meals. 08/15/21   [provider]    Current Facility-Administered Medications  Medication Dose Route Frequency Provider Last Rate Last Admin   0.9 %  sodium chloride infusion  10 mL/hr Intravenous Once Duffy Bruce, MD       acetaminophen (TYLENOL) tablet 650 mg  650 mg Oral Q6H PRN Irene Pap N, DO   650 mg at 01/30/22 2235   alteplase (CATHFLO ACTIVASE) injection 2 mg  2 mg Intracatheter Once PRN Colon Flattery, NP       carvedilol (COREG) tablet 12.5 mg  12.5 mg Oral BID WC Irene Pap N, DO   12.5 mg at 01/31/22 5852   Chlorhexidine Gluconate Cloth 2 % PADS 6 each  6 each Topical Q0600 Colon Flattery, NP   6 each at 01/30/22 1721   heparin injection 1,000 Units  1,000 Units Dialysis PRN Colon Flattery, NP   1,000 Units at 01/30/22 2052   levothyroxine (SYNTHROID) tablet 200 mcg  200 mcg Oral QAC breakfast Irene Pap N, DO       lidocaine (PF) (XYLOCAINE) 1 % injection 5 mL  5 mL Intradermal PRN Colon Flattery, NP       lidocaine-prilocaine (EMLA) cream 1 application   1 application  Topical PRN Colon Flattery, NP       melatonin tablet 5 mg  5 mg Oral QHS PRN Irene Pap N, DO   5 mg at 01/31/22 0055   ondansetron (ZOFRAN) injection 4 mg  4 mg Intravenous Q6H PRN Hall, Carole N, DO       pentafluoroprop-tetrafluoroeth (GEBAUERS) aerosol 1  application   1 application  Topical PRN Colon Flattery, NP       polyethylene glycol (MIRALAX / GLYCOLAX) packet 17 g  17 g Oral Daily PRN Irene Pap N, DO       sodium zirconium cyclosilicate (LOKELMA) packet 10 g  10 g Oral BID Irene Pap N, DO   10 g at 01/31/22 1112    Allergies as of 01/30/2022 - Review Complete  01/30/2022  Allergen Reaction Noted   Bupropion Other (See Comments) and Anaphylaxis 11/08/2018   Ciprofloxacin Other (See Comments) and Anaphylaxis 11/03/2018   Levetiracetam Other (See Comments) and Anaphylaxis 11/08/2018   Rituximab Anaphylaxis 11/03/2018   Tramadol Other (See Comments) and Anaphylaxis 11/08/2018   Fentanyl  01/14/2022   Oxycodone-acetaminophen Nausea Only 11/08/2018     Review of Systems:    All systems reviewed and negative except where noted in HPI.     Physical Exam:  Vital signs in last 24 hours: Temp:  [97.1 F (36.2 C)-99 F (37.2 C)] 98.4 F (36.9 C) (06/09 1203) Pulse Rate:  [68-107] 76 (06/09 1010) Resp:  [9-29] 23 (06/09 1203) BP: (119-168)/(65-98) 151/85 (06/09 1203) SpO2:  [95 %-100 %] 99 % (06/09 1015) Weight:  [93.4 kg-94.9 kg] 94.9 kg (06/08 1825) Last BM Date : 01/30/22 General:   Pleasant woman in NAD Head:  Normocephalic and atraumatic. Eyes:   No icterus.   Conjunctiva pink. Ears:  Normal auditory acuity. Mouth: Mucosa pink moist, no lesions. Neck:  Supple; no masses felt Lungs:  Respirations even and unlabored. Lungs clear to auscultation bilaterally.   No wheezes, crackles, or rhonchi.  Heart:  S1S2, RRR, no MRG. No edema. Abdomen:   Flat, soft, nondistended, nontender. Normal bowel sounds. No appreciable masses or hepatomegaly. No rebound signs or other peritoneal signs. Rectal:  Not performed.  Msk:  MAEW x4, No clubbing or cyanosis. Strength 5/5. Symmetrical without gross deformities. Neurologic:  Alert and  oriented x4;  Cranial nerves II-XII intact.  Skin:  Warm, dry, pink without significant lesions or  rashes. Psych:  Alert and cooperative. Normal affect.  LAB RESULTS: Recent Labs    01/30/22 1253 01/30/22 1409 01/31/22 0343  WBC  --  5.3 3.8*  HGB 6.1* 6.6* 6.7*  HCT 18.0* 19.5* 19.2*  PLT  --  181 154   BMET Recent Labs    01/30/22 1253 01/30/22 1305 01/30/22 1409 01/30/22 1825 01/30/22 2021 01/31/22 0343  NA 130*  --  132*  --   --  133*  K 6.5*   < > 7.4* 6.2* 3.8 5.3*  CL 95*  --  98  --   --  100  CO2  --   --  24  --   --  28  GLUCOSE 77  --  81  --   --  77  BUN 54*  --  53*  --   --  25*  CREATININE 13.80*  --  11.78*  --   --  7.42*  CALCIUM  --   --  9.5  --   --  8.8*   < > = values in this interval not displayed.   LFT Recent Labs    01/31/22 0343  PROT 6.0*  ALBUMIN 3.4*  AST 11*  ALT 8  ALKPHOS 49  BILITOT 0.7   PT/INR No results for input(s): "LABPROT", "INR" in the last 72 hours.  STUDIES: No results found.     Impression / Plan:   IDA/hematochezia - needs egd/colon- has been on cliq diet today- will discuss timing with Dr Haig Prophet, would like hgb >7 if possible- given clinical situation will try to get these done tomorrow as feasible  Thank you very much for this consult. These services were provided by Stephens November, NP-C, in collaboration with Lesly Rubenstein, MD, with whom I have discussed this patient in full.   Stephens November, NP-C

## 2022-01-31 NOTE — Anesthesia Preprocedure Evaluation (Signed)
Anesthesia Evaluation  Patient identified by MRN, date of birth, ID band Patient awake    Reviewed: Allergy & Precautions, NPO status , Patient's Chart, lab work & pertinent test results  History of Anesthesia Complications Negative for: history of anesthetic complications  Airway Mallampati: II  TM Distance: >3 FB Neck ROM: Full    Dental no notable dental hx. (+) Teeth Intact   Pulmonary shortness of breath, neg sleep apnea, neg COPD, Current Smoker and Patient abstained from smoking.,    Pulmonary exam normal breath sounds clear to auscultation       Cardiovascular Exercise Tolerance: Good METShypertension, Pt. on medications (-) CAD and (-) Past MI (-) dysrhythmias  Rhythm:Regular Rate:Normal - Systolic murmurs    Neuro/Psych PSYCHIATRIC DISORDERS Anxiety Depression negative neurological ROS     GI/Hepatic neg GERD  ,(+)     (-) substance abuse  , Gastroparesis noted on a previous EGD Blood in stool   Endo/Other  neg diabetesHypothyroidism   Renal/GU ESRF and DialysisRenal disease     Musculoskeletal   Abdominal   Peds  Hematology  (+) Blood dyscrasia, anemia , Anemia of chronic disease, s/p multiple blood transfusions this year.   Anesthesia Other Findings Pre-op labs 6/8 were concerning with hyperkalemia (7.4) and anemic (6.1). She was admitted for HD management and blood transfusion. K 5.5 with corrected hgb today. Pt reports mild SOB this morning with improvement. No other complaints. Lungs CTAB, no peripheral edema. VSS. Will proceed to endoscopy suite with plans by nephrology for HD today. I requested to do the procedure first to avoid potential hemodynamic instability if performed after HD. The risk of further hyperkalemic complications is lower than the risk of hypotension with a K of 5.5.   Past Medical History: No date: Anemia No date: Anxiety No date: Arthritis     Comment:  hands No date: Cancer  ([place]) No date: Complication of anesthesia     Comment:  with Fentanyl she is difficult to awake No date: Depression No date: Dialysis patient ([place]) No date: Dyspnea     Comment:  due to anemia No date: History of kidney stones No date: Hypertension No date: Hypothyroidism No date: Pneumonia No date: Renal disorder No date: Thyroid disease  Reproductive/Obstetrics                            Anesthesia Physical  Anesthesia Plan  ASA: 3  Anesthesia Plan: General   Post-op Pain Management:    Induction: Intravenous  PONV Risk Score and Plan: 3 and Propofol infusion and Treatment may vary due to age or medical condition  Airway Management Planned: Natural Airway  Additional Equipment: None  Intra-op Plan:   Post-operative Plan:   Informed Consent: I have reviewed the patients History and Physical, chart, labs and discussed the procedure including the risks, benefits and alternatives for the proposed anesthesia with the patient or authorized representative who has indicated his/her understanding and acceptance.     Dental advisory given  Plan Discussed with: CRNA and Surgeon  Anesthesia Plan Comments:        Anesthesia Quick Evaluation

## 2022-01-31 NOTE — Progress Notes (Signed)
  Progress Note   Patient: Holly [name] UVO:[id number] DOB: 1986/03/21 DOA: 01/30/2022     1 DOS: the patient was seen and examined on 01/31/2022   Assessment and Plan: * Hyperkalemia Potassium 7.4 on presentation with peaked T waves.  Was treated urgently and then had dialysis.  Came down to 3.8 after that.  Potassium went up to 5.3 today and repeat 5.8.  Will have dialysis again tomorrow.  Lokelma twice daily.  ESRD on dialysis [facility]) Had dialysis yesterday and will have again dialysis tomorrow.  Anemia of chronic disease Patient will have GI work-up while here.  Erythropoietin ordered by nephrology.  Since hemoglobin 6.7 today we will give a another unit of packed red blood cells today and recheck hemoglobin tomorrow.  Patient denies any signs of bleeding but did have black stools prior to coming in but was taking an iron supplement.  Essential hypertension Restart Norvasc  Hypothyroidism Continue levothyroxine  Hyponatremia Sodium 133 today.  Monitor with dialysis.        Subjective: Patient felt okay.  No abdominal pain.  No nausea or vomiting.  Did have some black stools a few weeks ago but was taking an iron supplement.  Physical Exam: Vitals:   01/31/22 1010 01/31/22 1015 01/31/22 1200 01/31/22 1203  BP: (!) 147/91 (!) 147/91 (!) 168/89 (!) 151/85  Pulse: 76     Resp: '18 18 15 '$ (!) 23  Temp: 98.2 F (36.8 C)   98.4 F (36.9 C)  TempSrc: Oral   Oral  SpO2: 99% 99%    Weight:      Height:       Physical Exam HENT:     Head: Normocephalic.     Mouth/Throat:     Pharynx: No oropharyngeal exudate.  Eyes:     General: Lids are normal.     Conjunctiva/sclera: Conjunctivae normal.  Cardiovascular:     Rate and Rhythm: Normal rate and regular rhythm.     Heart sounds: Normal heart sounds, S1 normal and S2 normal.  Pulmonary:     Breath sounds: No decreased breath sounds, wheezing, rhonchi or rales.  Abdominal:     Palpations: Abdomen is soft.      Tenderness: There is no abdominal tenderness.  Musculoskeletal:     Right lower leg: No swelling.     Left lower leg: No swelling.  Skin:    General: Skin is warm.     Findings: No rash.  Neurological:     Mental Status: She is alert and oriented to person, place, and time.     Data Reviewed: Last potassium 5.8, creatinine 7.42, ferritin 593, hemoglobin 6.7 and repeat after transfusion 8.0.  Family Communication: Declined  Disposition: Status is: Inpatient Remains inpatient appropriate because: Will need GI work-up prior to disposition and also being treated for hyperkalemia. Planned Discharge Destination: Home   Author: Loletha Grayer, MD 01/31/2022 3:41 PM  For on call review [email or web address].

## 2022-01-31 NOTE — Assessment & Plan Note (Signed)
Sodium 133 today.  Monitor with dialysis.

## 2022-01-31 NOTE — Assessment & Plan Note (Signed)
Continue levothyroxine 

## 2022-01-31 NOTE — Progress Notes (Signed)
[facility]  ROUNDING NOTE   Subjective:   Holly [name] is a 36 year old female with past medical conditions including hypertension, hypothyroidism, GI bleed, and end-stage renal disease on home hemodialysis.  Patient presents to the emergency apartment after arriving for a scheduled colonoscopy.  Patient had abnormal labs, potassium 7.4, hemoglobin 6.1.  Patient has been admitted for Hyperkalemia [E87.5]  Patient is known to our practice and is managed with her home hemodialysis by Dr. Candiss Norse in the [facility] clinic.  Patient has been on home hemodialysis since March of this year.  Was previously on peritoneal dialysis for 6 years.  She states no recent changes to diet or medication.  She states she was prepping for scheduled colonoscopy and was instructed to drink 32 ounces of Gatorade and also not perform her nightly hemodialysis treatment prior to colonoscopy.  Patient denies nausea, vomiting, diarrhea.  Denies shortness of breath or cough.  Denies dark or bloody stools or emesis.  Patient states she has previously had no anemic concerns.  States anemia began once she received Mircera during outpatient treatments.  Labs on ED arrival include sodium 132, potassium 7.4, BUN 53, creatinine 11.78 with GFR 4, phosphorus 10, and hemoglobin 6.6.  Patient received hyperkalemia protocol while waiting on hemodialysis.  This lowered potassium 6.2.  Patient received urgent dialysis with potassium 3.8 at completion.  We have been consulted to manage dialysis needs during this admission.   Objective:  Vital signs in last 24 hours:  Temp:  [97.1 F (36.2 C)-99 F (37.2 C)] 98.4 F (36.9 C) (06/09 1203) Pulse Rate:  [68-107] 76 (06/09 1010) Resp:  [9-29] 23 (06/09 1203) BP: (119-168)/(65-98) 151/85 (06/09 1203) SpO2:  [95 %-100 %] 99 % (06/09 1015) Weight:  [93.4 kg-94.9 kg] 94.9 kg (06/08 1825)  Weight change:  Filed Weights   01/30/22 1526 01/30/22 1720 01/30/22 1825   Weight: 93.4 kg 94.9 kg 94.9 kg    Intake/Output: I/O last 3 completed shifts: In: 1290 [P.O.:840; I.V.:100; Blood:300; IV Piggyback:50] Out: 50 [Urine:50]   Intake/Output this shift:  Total I/O In: 755 [P.O.:315; I.V.:50; Blood:390] Out: -   Physical Exam: General: NAD, sitting up in bed  Head: Normocephalic, atraumatic. Moist oral mucosal membranes  Eyes: Anicteric  Lungs:  Clear to auscultation, normal effort, room air  Heart: Regular rate and rhythm  Abdomen:  Soft, nontender, nondistended  Extremities: No peripheral edema.  Neurologic: Nonfocal, moving all four extremities  Skin: No lesions  Access: Right chest PermCath    Basic Metabolic Panel: Recent Labs  Lab 01/30/22 1253 01/30/22 1305 01/30/22 1409 01/30/22 1825 01/30/22 2021 01/31/22 0343 01/31/22 1207  NA 130*  --  132*  --   --  133*  --   K 6.5*   < > 7.4* 6.2* 3.8 5.3* 5.8*  CL 95*  --  98  --   --  100  --   CO2  --   --  24  --   --  28  --   GLUCOSE 77  --  81  --   --  77  --   BUN 54*  --  53*  --   --  25*  --   CREATININE 13.80*  --  11.78*  --   --  7.42*  --   CALCIUM  --   --  9.5  --   --  8.8*  --   MG  --   --  2.5*  --   --  2.0  --   PHOS  --   --  10.0*  --   --  6.9*  --    < > = values in this interval not displayed.    Liver Function Tests: Recent Labs  Lab 01/31/22 0343  AST 11*  ALT 8  ALKPHOS 49  BILITOT 0.7  PROT 6.0*  ALBUMIN 3.4*   No results for input(s): "LIPASE", "AMYLASE" in the last 168 hours. No results for input(s): "AMMONIA" in the last 168 hours.  CBC: Recent Labs  Lab 01/30/22 1253 01/30/22 1409 01/31/22 0343  WBC  --  5.3 3.8*  NEUTROABS  --   --  2.2  HGB 6.1* 6.6* 6.7*  HCT 18.0* 19.5* 19.2*  MCV  --  92.4 89.3  PLT  --  181 154    Cardiac Enzymes: No results for input(s): "CKTOTAL", "CKMB", "CKMBINDEX", "TROPONINI" in the last 168 hours.  BNP: Invalid input(s): "POCBNP"  CBG: Recent Labs  Lab 01/30/22 1544 01/30/22 1705  01/30/22 1720  GLUCAP 105* 112* 101*    Microbiology: Results for orders placed or performed during the hospital encounter of 01/30/22  MRSA Next Gen by PCR, Nasal     Status: None   Collection Time: 01/30/22  5:25 PM   Specimen: Nasal Mucosa; Nasal Swab  Result Value Ref Range Status   MRSA by PCR Next Gen NOT DETECTED NOT DETECTED Final    Comment: (NOTE) The GeneXpert MRSA Assay (FDA approved for NASAL specimens only), is one component of a comprehensive MRSA colonization surveillance program. It is not intended to diagnose MRSA infection nor to guide or monitor treatment for MRSA infections. Test performance is not FDA approved in patients less than 10 years old. Performed at [facility], [address] [place], [place] [address]     Coagulation Studies: No results for input(s): "LABPROT", "INR" in the last 72 hours.  Urinalysis: No results for input(s): "COLORURINE", "LABSPEC", "PHURINE", "GLUCOSEU", "HGBUR", "BILIRUBINUR", "KETONESUR", "PROTEINUR", "UROBILINOGEN", "NITRITE", "LEUKOCYTESUR" in the last 72 hours.  Invalid input(s): "APPERANCEUR"    Imaging: No results found.   Medications:    sodium chloride      carvedilol  12.5 mg Oral BID WC   Chlorhexidine Gluconate Cloth  6 each Topical Q0600   levothyroxine  200 mcg Oral QAC breakfast   sodium zirconium cyclosilicate  10 g Oral BID   acetaminophen, alteplase, heparin, lidocaine (PF), lidocaine-prilocaine, melatonin, ondansetron (ZOFRAN) IV, pentafluoroprop-tetrafluoroeth, polyethylene glycol  Assessment/ Plan:  Ms. Holly [name] is a 36 y.o.  female with past medical conditions including hypertension, hypothyroidism, GI bleed, and end-stage renal disease on home hemodialysis.  Patient presents to the emergency apartment after arriving for a scheduled colonoscopy.  Patient had abnormal labs, potassium 7.4, hemoglobin 6.1.  Patient has been admitted for Hyperkalemia [E87.5]   Hyperkalemia  with End-stage renal disease on home hemodialysis.  Reports nightly dialysis treatments.  We will convert patient to 3 treatments a week during this admission.  Received urgent dialysis due to hyperkalemia, 7.4 on admission.  Potassium 5.3 this AM.  Ordered Lokelma by primary team.  Follow-up potassium ordered for 5 PM.  Next treatment scheduled for Saturday.  2. Anemia of chronic kidney disease Lab Results  Component Value Date   HGB 6.7 (L) 01/31/2022    Hemoglobin below desired target.  Patient received 1 unit blood transfusion in ED with little effect.  Patient currently receiving 1 unit blood transfusion on unit ordered by primary team.  Patient  prescribed monthly Mircera outpatient.  Will prescribe IV EPO 10,000 units with dialysis treatments and consider transition back to EPO outpatient.  3. Secondary Hyperparathyroidism:  Lab Results  Component Value Date   CALCIUM 8.8 (L) 01/31/2022   CAION 1.19 01/30/2022   PHOS 6.9 (H) 01/31/2022    Calcium remains within acceptable range however phosphorus elevated.  We will continue to monitor for need of binders.  4.  Hypertension with chronic kidney disease.  Home regimen includes amlodipine, furosemide, losartan, carvedilol.  Currently only receiving carvedilol and furosemide.       LOS: [place] 6/9/20231:36 PM

## 2022-01-31 NOTE — Assessment & Plan Note (Signed)
Restart Norvasc

## 2022-01-31 NOTE — Assessment & Plan Note (Signed)
Will have dialysis today.

## 2022-01-31 NOTE — Assessment & Plan Note (Addendum)
Potassium 7.4 on presentation with peaked T waves.  Was treated urgently and then had dialysis.  Came down to 3.8 after that.  Potassium went up to 5.9 last night.  Lokelma twice daily.  Today's potassium 5.5.  We will have dialysis this afternoon.

## 2022-01-31 NOTE — Assessment & Plan Note (Signed)
Patient will have GI work-up while here.  Erythropoietin ordered by nephrology.  Since hemoglobin 6.7 today we will give a another unit of packed red blood cells today and recheck hemoglobin tomorrow.  Patient denies any signs of bleeding but did have black stools prior to coming in but was taking an iron supplement.

## 2022-02-01 ENCOUNTER — Inpatient Hospital Stay
Admission: EM | Disposition: A | Discharge status: Home / Self Care | Payer: Self-pay | Source: Home / Self Care | Attending: Internal Medicine

## 2022-02-01 ENCOUNTER — Other Ambulatory Visit: Payer: Self-pay

## 2022-02-01 ENCOUNTER — Inpatient Hospital Stay: Payer: Medicare Other | Admitting: Anesthesiology

## 2022-02-01 DIAGNOSIS — D638 Anemia in other chronic diseases classified elsewhere: Secondary | ICD-10-CM | POA: Diagnosis not present

## 2022-02-01 DIAGNOSIS — E875 Hyperkalemia: Secondary | ICD-10-CM | POA: Diagnosis not present

## 2022-02-01 DIAGNOSIS — E8770 Fluid overload, unspecified: Secondary | ICD-10-CM

## 2022-02-01 DIAGNOSIS — N186 End stage renal disease: Secondary | ICD-10-CM | POA: Diagnosis not present

## 2022-02-01 HISTORY — PX: ESOPHAGOGASTRODUODENOSCOPY (EGD) WITH PROPOFOL: SHX5813

## 2022-02-01 HISTORY — PX: COLONOSCOPY WITH PROPOFOL: SHX5780

## 2022-02-01 LAB — BASIC METABOLIC PANEL
Anion gap: 12 (ref 5–15)
BUN: 35 mg/dL — ABNORMAL HIGH (ref 6–20)
CO2: 26 mmol/L (ref 22–32)
Calcium: 9.2 mg/dL (ref 8.9–10.3)
Chloride: 94 mmol/L — ABNORMAL LOW (ref 98–111)
Creatinine, Ser: 9.88 mg/dL — ABNORMAL HIGH (ref 0.44–1.00)
GFR, Estimated: 5 mL/min — ABNORMAL LOW (ref >60)
Glucose, Bld: 79 mg/dL (ref 70–99)
Potassium: 5.5 mmol/L — ABNORMAL HIGH (ref 3.5–5.1)
Sodium: 132 mmol/L — ABNORMAL LOW (ref 135–145)

## 2022-02-01 LAB — HEPATITIS B SURFACE ANTIBODY, QUANTITATIVE: Hep B S AB Quant (Post): 47.5 m[IU]/mL (ref >9.9)

## 2022-02-01 LAB — CBC
HCT: 23.3 % — ABNORMAL LOW (ref 36.0–46.0)
Hemoglobin: 8.2 g/dL — ABNORMAL LOW (ref 12.0–15.0)
MCH: 30.8 pg (ref 26.0–34.0)
MCHC: 35.2 g/dL (ref 30.0–36.0)
MCV: 87.6 fL (ref 80.0–100.0)
Platelets: 155 10*3/uL (ref 150–400)
RBC: 2.66 MIL/uL — ABNORMAL LOW (ref 3.87–5.11)
RDW: 14.1 % (ref 11.5–15.5)
WBC: 6.3 10*3/uL (ref 4.0–10.5)
nRBC: 0 % (ref 0.0–0.2)

## 2022-02-01 SURGERY — ESOPHAGOGASTRODUODENOSCOPY (EGD) WITH PROPOFOL
Anesthesia: General

## 2022-02-01 MED ORDER — GLYCOPYRROLATE 0.2 MG/ML IJ SOLN
INTRAMUSCULAR | Status: DC | PRN
  Administered 2022-02-01: .2 mg via INTRAVENOUS

## 2022-02-01 MED ORDER — DEXMEDETOMIDINE (PRECEDEX) IN NS 20 MCG/5ML (4 MCG/ML) IV SYRINGE
PREFILLED_SYRINGE | INTRAVENOUS | Status: DC | PRN
  Administered 2022-02-01: 12 ug via INTRAVENOUS
  Administered 2022-02-01: 4 ug via INTRAVENOUS

## 2022-02-01 MED ORDER — GLYCOPYRROLATE 0.2 MG/ML IJ SOLN
INTRAMUSCULAR | Status: AC
  Filled 2022-02-01: qty 1

## 2022-02-01 MED ORDER — NITROGLYCERIN 0.4 MG SL SUBL: 0.4000 mg | SUBLINGUAL_TABLET | SUBLINGUAL | Status: DC | PRN

## 2022-02-01 MED ORDER — PROPOFOL 1000 MG/100ML IV EMUL
INTRAVENOUS | Status: AC
  Filled 2022-02-01: qty 200

## 2022-02-01 MED ORDER — AMLODIPINE BESYLATE 5 MG PO TABS
5.0000 mg | ORAL_TABLET | Freq: Every day | ORAL | Status: DC
  Administered 2022-02-01 – 2022-02-02 (×2): 5 mg via ORAL
  Filled 2022-02-01 (×2): qty 1

## 2022-02-01 MED ORDER — ACETAMINOPHEN 325 MG PO TABS
ORAL_TABLET | ORAL | Status: AC
  Filled 2022-02-01: qty 2

## 2022-02-01 MED ORDER — PROPOFOL 10 MG/ML IV BOLUS
INTRAVENOUS | Status: DC | PRN
  Administered 2022-02-01: 20 mg via INTRAVENOUS
  Administered 2022-02-01 (×2): 30 mg via INTRAVENOUS
  Administered 2022-02-01: 80 mg via INTRAVENOUS
  Administered 2022-02-01: 10 mg via INTRAVENOUS
  Administered 2022-02-01: 20 mg via INTRAVENOUS
  Administered 2022-02-01: 30 mg via INTRAVENOUS
  Administered 2022-02-01: 20 mg via INTRAVENOUS

## 2022-02-01 MED ORDER — PROPOFOL 500 MG/50ML IV EMUL
INTRAVENOUS | Status: DC | PRN
  Administered 2022-02-01: 150 ug/kg/min via INTRAVENOUS

## 2022-02-01 MED ORDER — LIDOCAINE HCL (CARDIAC) PF 100 MG/5ML IV SOSY
PREFILLED_SYRINGE | INTRAVENOUS | Status: DC | PRN
  Administered 2022-02-01: 80 mg via INTRAVENOUS

## 2022-02-01 MED ORDER — HEPARIN SODIUM (PORCINE) 1000 UNIT/ML IJ SOLN
INTRAMUSCULAR | Status: AC
  Administered 2022-02-01: 3200 [IU] via INTRAVENOUS_CENTRAL
  Filled 2022-02-01: qty 10

## 2022-02-01 NOTE — Progress Notes (Signed)
[facility]  PROGRESS NOTE   Subjective:   Events noted.  Patient had endoscopy done this morning which was negative. Colonoscopy preparation was poor. Patient is scheduled for dialysis today. Denies any chest pain or shortness of breath.  Objective:  Vital signs: Blood pressure (!) 154/103, pulse 81, temperature (!) 97.2 F (36.2 C), resp. rate 13, height '5\' 7"'$  (1.702 m), weight 94.9 kg, last menstrual period 01/12/2022, SpO2 100 %.  Intake/Output Summary (Last 24 hours) at 02/01/2022 1221 Last data filed at 01/31/2022 1700 Gross per 24 hour  Intake 480 ml  Output --  Net 480 ml   Filed Weights   01/30/22 1526 01/30/22 1720 01/30/22 1825  Weight: 93.4 kg 94.9 kg 94.9 kg     Physical Exam: General:  No acute distress  Head:  Normocephalic, atraumatic. Moist oral mucosal membranes  Eyes:  Anicteric  Neck:  Supple  Lungs:   Clear to auscultation, normal effort  Heart:  S1S2 no rubs  Abdomen:   Soft, nontender, bowel sounds present  Extremities:  peripheral edema.  Neurologic:  Awake, alert, following commands  Skin:  No lesions  Access:     Basic Metabolic Panel: Recent Labs  Lab 01/30/22 1253 01/30/22 1305 01/30/22 1409 01/30/22 1825 01/31/22 0343 01/31/22 1207 01/31/22 1655 01/31/22 2246 02/01/22 0642  NA 130*  --  132*  --  133*  --   --   --  132*  K 6.5*   < > 7.4*   < > 5.3* 5.8* 5.7* 5.9* 5.5*  CL 95*  --  98  --  100  --   --   --  94*  CO2  --   --  24  --  28  --   --   --  26  GLUCOSE 77  --  81  --  77  --   --   --  79  BUN 54*  --  53*  --  25*  --   --   --  35*  CREATININE 13.80*  --  11.78*  --  7.42*  --   --   --  9.88*  CALCIUM  --   --  9.5  --  8.8*  --   --   --  9.2  MG  --   --  2.5*  --  2.0  --   --   --   --   PHOS  --   --  10.0*  --  6.9*  --   --   --   --    < > = values in this interval not displayed.    CBC: Recent Labs  Lab 01/30/22 1253 01/30/22 1409 01/31/22 0343 01/31/22 1526 02/01/22 0642  WBC   --  5.3 3.8*  --  6.3  NEUTROABS  --   --  2.2  --   --   HGB 6.1* 6.6* 6.7* 8.0* 8.2*  HCT 18.0* 19.5* 19.2* 23.1* 23.3*  MCV  --  92.4 89.3  --  87.6  PLT  --  181 154  --  155     Urinalysis: No results for input(s): "COLORURINE", "LABSPEC", "PHURINE", "GLUCOSEU", "HGBUR", "BILIRUBINUR", "KETONESUR", "PROTEINUR", "UROBILINOGEN", "NITRITE", "LEUKOCYTESUR" in the last 72 hours.  Invalid input(s): "APPERANCEUR"    Imaging: No results found.   Medications:     amLODipine  5 mg Oral Daily   carvedilol  12.5 mg Oral BID WC   Chlorhexidine Gluconate Cloth  6 each Topical Q0600   cinacalcet  60 mg Oral Q supper   gabapentin  100 mg Oral TID   lanthanum  1,000 mg Oral TID WC   levothyroxine  200 mcg Oral QAC breakfast    Assessment/ Plan:     Principal Problem:   Hyperkalemia Active Problems:   Anemia of chronic disease   ESRD on dialysis [facility])   Essential hypertension   Hypothyroidism   Hyponatremia   Fluid overload  36 year old female with history of hypertension, hypothyroidism, GI bleed, end-stage renal disease on dialysis admitted to the hospital after labs showing severe hyperkalemia.  Patient had dialysis yesterday.  She has anemia and had endoscopy and an attempted colonoscopy done this morning.  #1: ESRD: We will schedule patient for dialysis again today.  #2: Anemia: Endoscopy was negative this morning.  GI note states she needs colonoscopy as outpatient.  Will transfuse as needed.  #3: Hyperkalemia: Patient is advised on the importance of dietary restriction of potassium.  #4: Hypertension: Continue amlodipine and carvedilol as ordered.  #5: Secondary hyperparathyroidism: We will continue lanthanum 1 g 3 times a day along with Sensipar 60 mg daily.  Dietary advice given to the patient.  We will continue to monitor closely.   LOS: [place], MD [facility] 6/10/202312:21 PM

## 2022-02-01 NOTE — Progress Notes (Signed)
Hemodialysis Post Treatment Note:  Tx date:02/01/2022 Tx time: 3 hours Access: right CVC UF Removed: No fluid removal as ordered  Note:  HD treatment completed. Tolerated well. No HD complications.

## 2022-02-01 NOTE — Progress Notes (Signed)
Patient rang out stating that she was suddenly having trouble breathing. She is satting 97-100% on RA. She said that she is having chest pressure but not pain, like something is sitting on her chest. She said that she does have anxiety. Lungs are clear and diminished. No history of blood clots either. BP is 164/113. K is 5.9. She says that it is usually in the 140s when she leaves day surgery from getting blood. She does not have a history of blood clots. She had dialysis yesterday but they didn't take anything off and she has drank her bowel prep. She said she may have taken in too much. She also had some lasix but she didn't have any results. EKG performed with no abnormality noted. Hospitalist made aware.

## 2022-02-01 NOTE — Progress Notes (Signed)
  Progress Note   Patient: Holly [name] URK:[id number] DOB: March 22, 1986 DOA: 01/30/2022     2 DOS: the patient was seen and examined on 02/01/2022     Assessment and Plan: * Hyperkalemia Potassium 7.4 on presentation with peaked T waves.  Was treated urgently and then had dialysis.  Came down to 3.8 after that.  Potassium went up to 5.9 last night.  Lokelma twice daily.  Today's potassium 5.5.  We will have dialysis this afternoon.  Fluid overload From receiving a unit of blood yesterday and did not urinate with Lasix.  ESRD on dialysis [facility]) Will have dialysis today.  Anemia of chronic disease Patient will have GI work-up while here.  Erythropoietin ordered by nephrology.  Received 2 units of packed red blood cells and today's hemoglobin 8.2.  EGD unremarkable.  Colonoscopy poor prep.  Will need outpatient repeat colonoscopy with a better prep and likely capsule endoscopy also.  Essential hypertension Blood pressure very high today.  We will reassess after dialysis.  Continue Coreg and Norvasc.  Hypothyroidism Continue levothyroxine  Hyponatremia Sodium 132 today.  Monitor with dialysis.        Subjective: Patient seen this morning and was having trouble breathing felt anxious about things.  Only finished half of the prep.  Admitted for anemia and hyperkalemia.  Physical Exam: Vitals:   02/01/22 0800 02/01/22 0836 02/01/22 0940 02/01/22 1047  BP: (!) 169/104 (!) 159/98 135/70 (!) 154/103  Pulse: 91 87 91 81  Resp: (!) '23 15 16 13  '$ Temp: 98.9 F (37.2 C) (!) 96.9 F (36.1 C) (!) 97.2 F (36.2 C)   TempSrc: Oral Temporal    SpO2: 99% 98% 98% 100%  Weight:      Height:       Physical Exam HENT:     Head: Normocephalic.     Mouth/Throat:     Pharynx: No oropharyngeal exudate.  Eyes:     General: Lids are normal.     Conjunctiva/sclera: Conjunctivae normal.  Cardiovascular:     Rate and Rhythm: Normal rate and regular rhythm.     Heart sounds: Normal heart  sounds, S1 normal and S2 normal.  Pulmonary:     Breath sounds: Examination of the right-lower field reveals decreased breath sounds and rales. Examination of the left-lower field reveals decreased breath sounds and rales. Decreased breath sounds and rales present. No wheezing or rhonchi.  Abdominal:     Palpations: Abdomen is soft.     Tenderness: There is no abdominal tenderness.  Musculoskeletal:     Right lower leg: No swelling.     Left lower leg: No swelling.  Skin:    General: Skin is warm.     Findings: No rash.  Neurological:     Mental Status: She is alert and oriented to person, place, and time.     Data Reviewed: Sodium 132, potassium 5.5, creatinine 9.88, hemoglobin 8.2 Family Communication: Declined  Disposition: Status is: Inpatient Remains inpatient appropriate because: We will get dialysis this afternoon for fluid overload. Planned Discharge Destination: Home  Author: Loletha Grayer, MD 02/01/2022 12:02 PM  For on call review [email or web address].

## 2022-02-01 NOTE — Op Note (Signed)
[facility] Patient Name: Holly [name] Procedure Date: 02/01/2022 8:19 AM MRN: [id number] Account #: [id number] Date of Birth: 08/25/1986 Admit Type: Inpatient Age: 36 Room: [facility] ENDO ROOM 4 Gender: Female Note Status: Finalized Instrument Name: Jasper Riling [id number] Procedure:             Colonoscopy Indications:           Iron deficiency anemia Providers:             Andrey Farmer MD, MD Medicines:             Monitored Anesthesia Care Complications:         No immediate complications. Procedure:             Pre-Anesthesia Assessment:                        - Prior to the procedure, a History and Physical was                         performed, and patient medications and allergies were                         reviewed. The patient is competent. The risks and                         benefits of the procedure and the sedation options and                         risks were discussed with the patient. All questions                         were answered and informed consent was obtained.                         Patient identification and proposed procedure were                         verified by the physician, the nurse, the                         anesthesiologist, the anesthetist and the technician                         in the endoscopy suite. Mental Status Examination:                         alert and oriented. Airway Examination: normal                         oropharyngeal airway and neck mobility. Respiratory                         Examination: clear to auscultation. CV Examination:                         normal. Prophylactic Antibiotics: The patient does not                         require prophylactic antibiotics. Prior  Anticoagulants: The patient has taken no previous                         anticoagulant or antiplatelet agents. ASA Grade                         Assessment: III - A patient with severe systemic                          disease. After reviewing the risks and benefits, the                         patient was deemed in satisfactory condition to                         undergo the procedure. The anesthesia plan was to use                         monitored anesthesia care (MAC). Immediately prior to                         administration of medications, the patient was                         re-assessed for adequacy to receive sedatives. The                         heart rate, respiratory rate, oxygen saturations,                         blood pressure, adequacy of pulmonary ventilation, and                         response to care were monitored throughout the                         procedure. The physical status of the patient was                         re-assessed after the procedure.                        After obtaining informed consent, the colonoscope was                         passed under direct vision. Throughout the procedure,                         the patient's blood pressure, pulse, and oxygen                         saturations were monitored continuously. The                         Colonoscope was introduced through the anus and                         advanced to the the ileocecal valve. The colonoscopy  was somewhat difficult due to inadequate bowel prep.                         The patient tolerated the procedure well. The quality                         of the bowel preparation was inadequate. Findings:      The perianal and digital rectal examinations were normal.      No obvious mass lesions or blood in colon but prep was inadequate      Internal hemorrhoids were found. The hemorrhoids were Grade II (internal       hemorrhoids that prolapse but reduce spontaneously). Impression:            - Preparation of the colon was inadequate.                        - Internal hemorrhoids.                        - No specimens  collected. Recommendation:        - Return patient to hospital ward for ongoing care.                        - Resume previous diet.                        - Continue present medications.                        - Repeat colonoscopy at appointment to be scheduled                         because the bowel preparation was suboptimal. Would                         also consider VCE as an outpatient.                        - Return to referring physician as previously                         scheduled. Procedure Code(s):     --- Professional ---                        [phone], Colonoscopy, flexible; diagnostic, including                         collection of specimen(s) by brushing or washing, when                         performed (separate procedure) Diagnosis Code(s):     --- Professional ---                        K64.1, Second degree hemorrhoids                        D50.9, Iron deficiency anemia, unspecified CPT copyright 2019 [employer]. All rights reserved. The codes documented in this report are preliminary and upon coder review  may  be revised to meet current compliance requirements. Andrey Farmer MD, MD 02/01/2022 9:33:51 AM Number of Addenda: 0 Note Initiated On: 02/01/2022 8:19 AM Scope Withdrawal Time: 0 hours 3 minutes 2 seconds  Total Procedure Duration: 0 hours 16 minutes 23 seconds  Estimated Blood Loss:  Estimated blood loss: none.      [facility]

## 2022-02-01 NOTE — Op Note (Signed)
[facility] Patient Name: Holly [name] Procedure Date: 02/01/2022 8:19 AM MRN: [id number] Account #: [id number] Date of Birth: 08/11/1986 Admit Type: Inpatient Age: 36 Room: [facility] ENDO ROOM 4 Gender: Female Note Status: Finalized Instrument Name: Altamese Cabal Endoscope [id number] Procedure:             Upper GI endoscopy Indications:           Iron deficiency anemia Providers:             Andrey Farmer MD, MD Medicines:             Monitored Anesthesia Care Complications:         No immediate complications. Procedure:             Pre-Anesthesia Assessment:                        - Prior to the procedure, a History and Physical was                         performed, and patient medications and allergies were                         reviewed. The patient is competent. The risks and                         benefits of the procedure and the sedation options and                         risks were discussed with the patient. All questions                         were answered and informed consent was obtained.                         Patient identification and proposed procedure were                         verified by the physician, the nurse, the                         anesthesiologist, the anesthetist and the technician                         in the endoscopy suite. Mental Status Examination:                         alert and oriented. Airway Examination: normal                         oropharyngeal airway and neck mobility. Respiratory                         Examination: clear to auscultation. CV Examination:                         normal. Prophylactic Antibiotics: The patient does not                         require prophylactic antibiotics. Prior  Anticoagulants: The patient has taken no previous                         anticoagulant or antiplatelet agents. ASA Grade                         Assessment: III - A patient with severe  systemic                         disease. After reviewing the risks and benefits, the                         patient was deemed in satisfactory condition to                         undergo the procedure. The anesthesia plan was to use                         monitored anesthesia care (MAC). Immediately prior to                         administration of medications, the patient was                         re-assessed for adequacy to receive sedatives. The                         heart rate, respiratory rate, oxygen saturations,                         blood pressure, adequacy of pulmonary ventilation, and                         response to care were monitored throughout the                         procedure. The physical status of the patient was                         re-assessed after the procedure.                        After obtaining informed consent, the endoscope was                         passed under direct vision. Throughout the procedure,                         the patient's blood pressure, pulse, and oxygen                         saturations were monitored continuously. The Endoscope                         was introduced through the mouth, and advanced to the                         second part of duodenum. The upper GI endoscopy was  accomplished without difficulty. The patient tolerated                         the procedure well. Findings:      The examined esophagus was normal.      The entire examined stomach was normal.      The examined duodenum was normal. Impression:            - Normal esophagus.                        - Normal stomach.                        - Normal examined duodenum.                        - No specimens collected. Recommendation:        - Return patient to hospital ward for ongoing care.                        - Resume previous diet.                        - Continue present medications.                        -  Perform a colonoscopy today. Procedure Code(s):     --- Professional ---                        [phone], Esophagogastroduodenoscopy, flexible,                         transoral; diagnostic, including collection of                         specimen(s) by brushing or washing, when performed                         (separate procedure) Diagnosis Code(s):     --- Professional ---                        D50.9, Iron deficiency anemia, unspecified CPT copyright 2019 American Medical Association. All rights reserved. The codes documented in this report are preliminary and upon coder review may  be revised to meet current compliance requirements. Andrey Farmer MD, MD 02/01/2022 9:29:17 AM Number of Addenda: 0 Note Initiated On: 02/01/2022 8:19 AM Estimated Blood Loss:  Estimated blood loss: none.      [facility]

## 2022-02-01 NOTE — Assessment & Plan Note (Signed)
From receiving a unit of blood yesterday and did not urinate with Lasix.

## 2022-02-01 NOTE — Transfer of Care (Signed)
Immediate Anesthesia Transfer of Care Note  Patient: Holly [name]  Procedure(s) Performed: ESOPHAGOGASTRODUODENOSCOPY (EGD) WITH PROPOFOL COLONOSCOPY WITH PROPOFOL  Patient Location: ICU 8  Anesthesia Type:General  Level of Consciousness: sedated and responds to stimulation  Airway & Oxygen Therapy: Patient Spontanous Breathing  Post-op Assessment: Report given to RN and Post -op Vital signs reviewed and stable  Post vital signs: Reviewed and stable  Last Vitals:  Vitals Value Taken Time  BP 135/70 02/01/22 0940  Temp 36.2 C 02/01/22 0940  Pulse 86 02/01/22 0940  Resp 13 02/01/22 0940  SpO2 99 % 02/01/22 0940  Vitals shown include unvalidated device data.  Last Pain:  Vitals:   02/01/22 0836  TempSrc: Temporal  PainSc:       Patients Stated Pain Goal: 0 (72/62/03 5597)  Complications: No notable events documented.

## 2022-02-02 DIAGNOSIS — E8779 Other fluid overload: Secondary | ICD-10-CM | POA: Diagnosis not present

## 2022-02-02 DIAGNOSIS — N186 End stage renal disease: Secondary | ICD-10-CM | POA: Diagnosis not present

## 2022-02-02 DIAGNOSIS — Z992 Dependence on renal dialysis: Secondary | ICD-10-CM | POA: Diagnosis not present

## 2022-02-02 DIAGNOSIS — E875 Hyperkalemia: Secondary | ICD-10-CM | POA: Diagnosis not present

## 2022-02-02 DIAGNOSIS — D638 Anemia in other chronic diseases classified elsewhere: Secondary | ICD-10-CM | POA: Diagnosis not present

## 2022-02-02 DIAGNOSIS — E8770 Fluid overload, unspecified: Secondary | ICD-10-CM | POA: Diagnosis not present

## 2022-02-02 LAB — BASIC METABOLIC PANEL
Anion gap: 10 (ref 5–15)
BUN: 26 mg/dL — ABNORMAL HIGH (ref 6–20)
CO2: 27 mmol/L (ref 22–32)
Calcium: 8.4 mg/dL — ABNORMAL LOW (ref 8.9–10.3)
Chloride: 93 mmol/L — ABNORMAL LOW (ref 98–111)
Creatinine, Ser: 7.67 mg/dL — ABNORMAL HIGH (ref 0.44–1.00)
GFR, Estimated: 7 mL/min — ABNORMAL LOW (ref >60)
Glucose, Bld: 85 mg/dL (ref 70–99)
Potassium: 4.6 mmol/L (ref 3.5–5.1)
Sodium: 130 mmol/L — ABNORMAL LOW (ref 135–145)

## 2022-02-02 LAB — HEMOGLOBIN: Hemoglobin: 7.6 g/dL — ABNORMAL LOW (ref 12.0–15.0)

## 2022-02-02 MED ORDER — AMLODIPINE BESYLATE 10 MG PO TABS: 10.0000 mg | ORAL_TABLET | Freq: Every day | ORAL | 0 refills | Status: DC

## 2022-02-02 MED ORDER — AMLODIPINE BESYLATE 5 MG PO TABS
5.0000 mg | ORAL_TABLET | Freq: Once | ORAL | Status: AC
  Administered 2022-02-02: 5 mg via ORAL
  Filled 2022-02-02: qty 1

## 2022-02-02 MED ORDER — LOSARTAN POTASSIUM 50 MG PO TABS: 100.0000 mg | ORAL_TABLET | Freq: Every day | ORAL | Status: DC

## 2022-02-02 NOTE — Clinical Social Work Note (Signed)
  Transition of Care [facility]) Screening Note   Patient Details  Name: Holly [name] Date of Birth: 1985/10/26   Transition of Care [facility]) CM/SW Contact:    Eileen Stanford, LCSW Phone PQDIYM:[phone] 02/02/2022, 9:07 AM    Transition of Care Department [facility]) has reviewed patient and no TOC needs have been identified at this time. We will continue to monitor patient advancement through interdisciplinary progression rounds. If new patient transition needs arise, please place a TOC consult.

## 2022-02-02 NOTE — Progress Notes (Signed)
[facility]  PROGRESS NOTE   Subjective:   Patient sitting in the chair.  Anxious to go home. Hemoglobin is still low.  Patient states she had multiple transfusions previously. Would like to pursue colonoscopy as outpatient. She can do daily dialysis and reach the target weight.  Objective:  Vital signs: Blood pressure (!) 158/96, pulse 82, temperature 98.4 F (36.9 C), resp. rate 20, height '5\' 7"'$  (1.702 m), weight 96.4 kg, last menstrual period 01/12/2022, SpO2 100 %.  Intake/Output Summary (Last 24 hours) at 02/02/2022 1152 Last data filed at 02/01/2022 1734 Gross per 24 hour  Intake 240 ml  Output 0 ml  Net 240 ml   Filed Weights   01/30/22 1825 02/01/22 1427 02/01/22 1820  Weight: 94.9 kg 96.2 kg 96.4 kg     Physical Exam: General:  No acute distress  Head:  Normocephalic, atraumatic. Moist oral mucosal membranes  Eyes:  Anicteric  Neck:  Supple  Lungs:   Clear to auscultation, normal effort  Heart:  S1S2 no rubs  Abdomen:   Soft, nontender, bowel sounds present  Extremities:  peripheral edema.  Neurologic:  Awake, alert, following commands  Skin:  No lesions  Access:     Basic Metabolic Panel: Recent Labs  Lab 01/30/22 1253 01/30/22 1305 01/30/22 1409 01/30/22 1825 01/31/22 0343 01/31/22 1207 01/31/22 1655 01/31/22 2246 02/01/22 0642 02/02/22 0719  NA 130*  --  132*  --  133*  --   --   --  132* 130*  K 6.5*   < > 7.4*   < > 5.3* 5.8* 5.7* 5.9* 5.5* 4.6  CL 95*  --  98  --  100  --   --   --  94* 93*  CO2  --   --  24  --  28  --   --   --  26 27  GLUCOSE 77  --  81  --  77  --   --   --  79 85  BUN 54*  --  53*  --  25*  --   --   --  35* 26*  CREATININE 13.80*  --  11.78*  --  7.42*  --   --   --  9.88* 7.67*  CALCIUM  --    < > 9.5  --  8.8*  --   --   --  9.2 8.4*  MG  --   --  2.5*  --  2.0  --   --   --   --   --   PHOS  --   --  10.0*  --  6.9*  --   --   --   --   --    < > = values in this interval not displayed.     CBC: Recent Labs  Lab 01/30/22 1253 01/30/22 1409 01/31/22 0343 01/31/22 1526 02/01/22 0642 02/02/22 0719  WBC  --  5.3 3.8*  --  6.3  --   NEUTROABS  --   --  2.2  --   --   --   HGB 6.1* 6.6* 6.7* 8.0* 8.2* 7.6*  HCT 18.0* 19.5* 19.2* 23.1* 23.3*  --   MCV  --  92.4 89.3  --  87.6  --   PLT  --  181 154  --  155  --      Urinalysis: No results for input(s): "COLORURINE", "LABSPEC", "PHURINE", "GLUCOSEU", "HGBUR", "BILIRUBINUR", "KETONESUR", "PROTEINUR", "UROBILINOGEN", "NITRITE", "LEUKOCYTESUR"  in the last 72 hours.  Invalid input(s): "APPERANCEUR"    Imaging: No results found.   Medications:     amLODipine  5 mg Oral Daily   carvedilol  12.5 mg Oral BID WC   Chlorhexidine Gluconate Cloth  6 each Topical Q0600   cinacalcet  60 mg Oral Q supper   gabapentin  100 mg Oral TID   lanthanum  1,000 mg Oral TID WC   levothyroxine  200 mcg Oral QAC breakfast    Assessment/ Plan:     Principal Problem:   Hyperkalemia Active Problems:   Anemia of chronic disease   ESRD on dialysis [facility])   Essential hypertension   Hypothyroidism   Hyponatremia   Fluid overload  36 year old female with history of hypertension, hypothyroidism, GI bleed, end-stage renal disease on dialysis admitted to the hospital after labs showing severe hyperkalemia.  Patient had dialysis yesterday.  She has anemia and had endoscopy and an attempted colonoscopy done this morning.   #1: ESRD: Patient is on home hemodialysis treatments.  She is 4 kg over her dry weight.  Patient said she can manage fluid removal with a daily dialysis and reach the target.   #2: Anemia: Endoscopy was negative and patient is willing to have colonoscopy as outpatient.  Previously had multiple PRBC transfusions.   #3: Hyperkalemia: Patient is advised on the importance of dietary restriction of potassium.   #4: Hypertension: Continue amlodipine and carvedilol as ordered.   #5: Secondary hyperparathyroidism: We will  continue lanthanum 1 g 3 times a day along with Sensipar 60 mg daily.   Patient can be discharged home from renal standpoint today. She is advised to follow-up with Dr. Candiss Norse.   LOS: [place], MD [facility] 6/11/202311:52 AM

## 2022-02-02 NOTE — Discharge Summary (Signed)
Physician Discharge Summary   Patient: Holly [name] MRN: [id number] DOB: 1985/09/06  Admit date:     01/30/2022  Discharge date: 02/02/22  Discharge Physician: Loletha Grayer   PCP: Pcp, No   Recommendations at discharge:   Follow-up with your medical doctor which is your nephrologist Follow-up gastroenterology as outpatient in a few months  Discharge Diagnoses: Principal Problem:   Hyperkalemia Active Problems:   ESRD on dialysis [facility])   Fluid overload   Anemia of chronic disease   Essential hypertension   Hypothyroidism   Hyponatremia   Hospital Course: Patient was sent into the hospital on 01/30/2022 by gastroenterology team for hyperkalemia prior to GI work-up.  Potassium went as high as 7.4 and patient had peaked T waves.  She was treated urgently for hyperkalemia with medications and then had dialysis.  Potassium did come down to 3.8 but then went up again to 5.9.  She was given Lokelma.  On 02/01/2022 she had an EGD and colonoscopy.  EGD was normal.  Colonoscopy was poor prep and showed internal hemorrhoids.  The patient was given 2 units of packed red blood cells during the hospital course.  Patient did have signs of fluid overload and was dialyzed on the evening of 02/01/2022.  The patient stated that she does feel short of breath still.  The patient does have home dialysis and wanted to go home to do dialysis to remove some fluid.  Hemoglobin upon discharge 7.6.  She feels well.  I did not want to transfuse another unit of packed red blood cells without dialysis since she does not urinate.  Follow-up closely as outpatient for repeat blood counts and likely will need capsule study and repeat colonoscopy.  Assessment and Plan: * Hyperkalemia Potassium 7.4 on presentation with peaked T waves.  Was treated urgently and then had dialysis.  Came down to 3.8 after that.  Potassium went up to 5.9 last night.  Lokelma twice daily.  Potassium upon discharge 4.6.  Continue to hold ARB  for right now  Fluid overload Patient still has some symptoms of fluid overload with shortness of breath.  Seen by nephrology on the day of discharge and she will go home and have dialysis at home to remove fluid.  ESRD on dialysis [facility]) Had 2 dialysis sessions in the hospital.  Nephrology cleared to go home to do home dialysis.  Anemia of chronic disease Patient will have GI work-up while here.  Erythropoietin ordered by nephrology.  Received 2 units of packed red blood cells and today's hemoglobin 8.2.  EGD unremarkable.  Colonoscopy poor prep.  Will need outpatient repeat colonoscopy with a better prep and likely capsule endoscopy also.  Hemoglobin upon discharge 7.6.  I did not want to give a further transfusion without dialysis since the patient has signs of fluid overload.  Likely will have to go back to [facility] as outpatient.  Essential hypertension Blood pressure very high today.  Increase dose of Norvasc to 10 mg daily.  Continue Coreg.  Hold ARB with hyperkalemia on presentation  Hypothyroidism Continue levothyroxine  Hyponatremia Sodium 130 today.  Monitor with dialysis.         Consultants: Nephrology, gastroenterology Procedures performed: EGD and colonoscopy Disposition: Home Diet recommendation:  Renal diet DISCHARGE MEDICATION: Allergies as of 02/02/2022       Reactions   Bupropion Other (See Comments), Anaphylaxis   Caused seizures Caused seizures Medical Record H&P Medical Record H&P Contributed to seizures Other reaction(s): Other (See Comments) Medical  Record H&P Medical Record H&P Medical Record H&P   Ciprofloxacin Other (See Comments), Anaphylaxis   Caused Seizures  Caused Seizures  Seizures  Seizures  Other reaction(s): Other (See Comments) Seizures  Seizures  Seizures    Levetiracetam Other (See Comments), Anaphylaxis   Medical Record H&P (unknown reaction) Medical Record H&P (unknown reaction) Medical Record H&P Medical Record  H&P Other reaction(s): Other (See Comments) Medical Record H&P Medical Record H&P Medical Record H&P   Rituximab Anaphylaxis   Tramadol Other (See Comments), Anaphylaxis   Caused seizures Other reaction(s): Other (See Comments) Caused seizures Medical Record H&P Medical Record H&P Other reaction(s): Other (See Comments) Medical Record H&P Medical Record H&P Medical Record H&P   Fentanyl    Patient reports that with surgery she is difficult to wake up   Oxycodone-acetaminophen Nausea Only        Medication List     STOP taking these medications    furosemide 80 MG tablet Commonly known as: LASIX   losartan 100 MG tablet Commonly known as: COZAAR       TAKE these medications    acetaminophen 500 MG tablet Commonly known as: TYLENOL Take 1,000 mg by mouth every 6 (six) hours as needed for headache (pain).   acyclovir 200 MG capsule Commonly known as: ZOVIRAX Take 200 mg by mouth 2 (two) times daily as needed (breakouts).   amLODipine 10 MG tablet Commonly known as: NORVASC Take 1 tablet (10 mg total) by mouth daily. What changed:  medication strength how much to take when to take this   carvedilol 12.5 MG tablet Commonly known as: COREG Take 12.5 mg by mouth 2 (two) times daily with a meal.   cinacalcet 60 MG tablet Commonly known as: SENSIPAR Take 60 mg by mouth at bedtime.   diclofenac Sodium 1 % Gel Commonly known as: VOLTAREN Apply 1 application. topically 4 (four) times daily as needed (pain).   gabapentin 100 MG capsule Commonly known as: NEURONTIN Take 100 mg by mouth 3 (three) times daily.   HYDROcodone-acetaminophen 5-325 MG tablet Commonly known as: Norco Take 1 tablet by mouth every 6 (six) hours as needed for moderate pain or severe pain.   lanthanum 1000 MG chewable tablet Commonly known as: FOSRENOL Chew 1,000 mg by mouth 3 (three) times daily with meals.   levothyroxine 200 MCG tablet Commonly known as: SYNTHROID Take 200  mcg by mouth daily before breakfast.   ondansetron 4 MG tablet Commonly known as: Zofran Take 1 tablet (4 mg total) by mouth every 8 (eight) hours as needed for nausea or vomiting.        Follow-up Information     Murlean Iba, MD Follow up in 1 week(s).   Specialty: Nephrology Contact information: [place] [id number] [phone]                Discharge Exam: Danley Danker Weights   01/30/22 1825 02/01/22 1427 02/01/22 1820  Weight: 94.9 kg 96.2 kg 96.4 kg   Physical Exam HENT:     Head: Normocephalic.     Mouth/Throat:     Pharynx: No oropharyngeal exudate.  Eyes:     General: Lids are normal.     Conjunctiva/sclera: Conjunctivae normal.  Cardiovascular:     Rate and Rhythm: Normal rate and regular rhythm.     Heart sounds: Normal heart sounds, S1 normal and S2 normal.  Pulmonary:     Breath sounds: Examination of the right-lower field reveals decreased breath  sounds. Examination of the left-lower field reveals decreased breath sounds. Decreased breath sounds present. No wheezing, rhonchi or rales.  Abdominal:     Palpations: Abdomen is soft.     Tenderness: There is no abdominal tenderness.  Musculoskeletal:     Right lower leg: No swelling.     Left lower leg: No swelling.  Skin:    General: Skin is warm.     Findings: No rash.  Neurological:     Mental Status: She is alert and oriented to person, place, and time.      Condition at discharge: stable  The results of significant diagnostics from this hospitalization (including imaging, microbiology, ancillary and laboratory) are listed below for reference.     Microbiology: Results for orders placed or performed during the hospital encounter of 01/30/22  MRSA Next Gen by PCR, Nasal     Status: None   Collection Time: 01/30/22  5:25 PM   Specimen: Nasal Mucosa; Nasal Swab  Result Value Ref Range Status   MRSA by PCR Next Gen NOT DETECTED NOT DETECTED Final    Comment:  (NOTE) The GeneXpert MRSA Assay (FDA approved for NASAL specimens only), is one component of a comprehensive MRSA colonization surveillance program. It is not intended to diagnose MRSA infection nor to guide or monitor treatment for MRSA infections. Test performance is not FDA approved in patients less than 69 years old. Performed at [facility], [address] [place],  [address]     Labs: CBC: Recent Labs  Lab 01/30/22 1253 01/30/22 1409 01/31/22 0343 01/31/22 1526 02/01/22 0642 02/02/22 0719  WBC  --  5.3 3.8*  --  6.3  --   NEUTROABS  --   --  2.2  --   --   --   HGB 6.1* 6.6* 6.7* 8.0* 8.2* 7.6*  HCT 18.0* 19.5* 19.2* 23.1* 23.3*  --   MCV  --  92.4 89.3  --  87.6  --   PLT  --  181 154  --  155  --    Basic Metabolic Panel: Recent Labs  Lab 01/30/22 1253 01/30/22 1305 01/30/22 1409 01/30/22 1825 01/31/22 0343 01/31/22 1207 01/31/22 1655 01/31/22 2246 02/01/22 0642 02/02/22 0719  NA 130*  --  132*  --  133*  --   --   --  132* 130*  K 6.5*   < > 7.4*   < > 5.3* 5.8* 5.7* 5.9* 5.5* 4.6  CL 95*  --  98  --  100  --   --   --  94* 93*  CO2  --   --  24  --  28  --   --   --  26 27  GLUCOSE 77  --  81  --  77  --   --   --  79 85  BUN 54*  --  53*  --  25*  --   --   --  35* 26*  CREATININE 13.80*  --  11.78*  --  7.42*  --   --   --  9.88* 7.67*  CALCIUM  --   --  9.5  --  8.8*  --   --   --  9.2 8.4*  MG  --   --  2.5*  --  2.0  --   --   --   --   --   PHOS  --   --  10.0*  --  6.9*  --   --   --   --   --    < > =  values in this interval not displayed.   Liver Function Tests: Recent Labs  Lab 01/31/22 0343  AST 11*  ALT 8  ALKPHOS 49  BILITOT 0.7  PROT 6.0*  ALBUMIN 3.4*   CBG: Recent Labs  Lab 01/30/22 1544 01/30/22 1705 01/30/22 1720  GLUCAP 105* 112* 101*    Discharge time spent: greater than 30 minutes.  Signed: Loletha Grayer, MD [facility] 02/02/2022

## 2022-02-02 NOTE — Anesthesia Postprocedure Evaluation (Signed)
Anesthesia Post Note  Patient: Holly [name]  Procedure(s) Performed: ESOPHAGOGASTRODUODENOSCOPY (EGD) WITH PROPOFOL COLONOSCOPY WITH PROPOFOL  Patient location during evaluation: PACU Anesthesia Type: General Level of consciousness: awake and alert Pain management: pain level controlled Vital Signs Assessment: post-procedure vital signs reviewed and stable Respiratory status: spontaneous breathing, nonlabored ventilation and respiratory function stable Cardiovascular status: blood pressure returned to baseline and stable Postop Assessment: no apparent nausea or vomiting Anesthetic complications: no   No notable events documented.   Last Vitals:  Vitals:   02/02/22 0703 02/02/22 0726  BP: (!) 171/112 (!) 158/96  Pulse:  82  Resp:  20  Temp:  36.9 C  SpO2:  100%    Last Pain:  Vitals:   02/02/22 0621  TempSrc: Oral  PainSc:                  [place] Ouch

## 2022-02-03 ENCOUNTER — Encounter: Payer: Self-pay | Admitting: Gastroenterology

## 2022-02-03 ENCOUNTER — Encounter: Admission: RE | Payer: Self-pay | Source: Home / Self Care

## 2022-02-03 ENCOUNTER — Inpatient Hospital Stay
Admission: RE | Admit: 2022-02-03 | Payer: BC Managed Care – PPO | Source: Home / Self Care | Admitting: Gastroenterology

## 2022-02-03 DIAGNOSIS — N186 End stage renal disease: Secondary | ICD-10-CM | POA: Diagnosis not present

## 2022-02-03 DIAGNOSIS — Z992 Dependence on renal dialysis: Secondary | ICD-10-CM | POA: Diagnosis not present

## 2022-02-03 DIAGNOSIS — E8779 Other fluid overload: Secondary | ICD-10-CM | POA: Diagnosis not present

## 2022-02-03 LAB — TYPE AND SCREEN
ABO/RH(D): O POS
Antibody Screen: NEGATIVE
Donor AG Type: NEGATIVE
Donor AG Type: NEGATIVE
Unit division: 0
Unit division: 0
Unit division: 0
Unit division: 0

## 2022-02-03 LAB — BPAM RBC
Blood Product Expiration Date: 202306282359
Blood Product Expiration Date: 202306282359
Blood Product Expiration Date: 202307172359
Blood Product Expiration Date: 202307182359
ISSUE DATE / TIME: 202306081832
ISSUE DATE / TIME: 202306090948
Unit Type and Rh: 5100
Unit Type and Rh: 5100
Unit Type and Rh: 5100
Unit Type and Rh: 5100

## 2022-02-03 SURGERY — COLONOSCOPY
Anesthesia: General

## 2022-02-04 DIAGNOSIS — Z992 Dependence on renal dialysis: Secondary | ICD-10-CM | POA: Diagnosis not present

## 2022-02-04 DIAGNOSIS — E8779 Other fluid overload: Secondary | ICD-10-CM | POA: Diagnosis not present

## 2022-02-04 DIAGNOSIS — N186 End stage renal disease: Secondary | ICD-10-CM | POA: Diagnosis not present

## 2022-02-05 DIAGNOSIS — E8779 Other fluid overload: Secondary | ICD-10-CM | POA: Diagnosis not present

## 2022-02-05 DIAGNOSIS — Z992 Dependence on renal dialysis: Secondary | ICD-10-CM | POA: Diagnosis not present

## 2022-02-05 DIAGNOSIS — N186 End stage renal disease: Secondary | ICD-10-CM | POA: Diagnosis not present

## 2022-02-06 DIAGNOSIS — Z992 Dependence on renal dialysis: Secondary | ICD-10-CM | POA: Diagnosis not present

## 2022-02-06 DIAGNOSIS — E8779 Other fluid overload: Secondary | ICD-10-CM | POA: Diagnosis not present

## 2022-02-06 DIAGNOSIS — N186 End stage renal disease: Secondary | ICD-10-CM | POA: Diagnosis not present

## 2022-02-07 DIAGNOSIS — Z992 Dependence on renal dialysis: Secondary | ICD-10-CM | POA: Diagnosis not present

## 2022-02-07 DIAGNOSIS — E8779 Other fluid overload: Secondary | ICD-10-CM | POA: Diagnosis not present

## 2022-02-07 DIAGNOSIS — N186 End stage renal disease: Secondary | ICD-10-CM | POA: Diagnosis not present

## 2022-02-08 DIAGNOSIS — E8779 Other fluid overload: Secondary | ICD-10-CM | POA: Diagnosis not present

## 2022-02-08 DIAGNOSIS — N186 End stage renal disease: Secondary | ICD-10-CM | POA: Diagnosis not present

## 2022-02-08 DIAGNOSIS — Z992 Dependence on renal dialysis: Secondary | ICD-10-CM | POA: Diagnosis not present

## 2022-02-09 DIAGNOSIS — N186 End stage renal disease: Secondary | ICD-10-CM | POA: Diagnosis not present

## 2022-02-09 DIAGNOSIS — E8779 Other fluid overload: Secondary | ICD-10-CM | POA: Diagnosis not present

## 2022-02-09 DIAGNOSIS — Z992 Dependence on renal dialysis: Secondary | ICD-10-CM | POA: Diagnosis not present

## 2022-02-10 DIAGNOSIS — E8779 Other fluid overload: Secondary | ICD-10-CM | POA: Diagnosis not present

## 2022-02-10 DIAGNOSIS — N186 End stage renal disease: Secondary | ICD-10-CM | POA: Diagnosis not present

## 2022-02-10 DIAGNOSIS — Z992 Dependence on renal dialysis: Secondary | ICD-10-CM | POA: Diagnosis not present

## 2022-02-11 DIAGNOSIS — N186 End stage renal disease: Secondary | ICD-10-CM | POA: Diagnosis not present

## 2022-02-11 DIAGNOSIS — E8779 Other fluid overload: Secondary | ICD-10-CM | POA: Diagnosis not present

## 2022-02-11 DIAGNOSIS — Z992 Dependence on renal dialysis: Secondary | ICD-10-CM | POA: Diagnosis not present

## 2022-02-12 DIAGNOSIS — Z992 Dependence on renal dialysis: Secondary | ICD-10-CM | POA: Diagnosis not present

## 2022-02-12 DIAGNOSIS — E8779 Other fluid overload: Secondary | ICD-10-CM | POA: Diagnosis not present

## 2022-02-12 DIAGNOSIS — N186 End stage renal disease: Secondary | ICD-10-CM | POA: Diagnosis not present

## 2022-02-13 DIAGNOSIS — Z992 Dependence on renal dialysis: Secondary | ICD-10-CM | POA: Diagnosis not present

## 2022-02-13 DIAGNOSIS — E8779 Other fluid overload: Secondary | ICD-10-CM | POA: Diagnosis not present

## 2022-02-13 DIAGNOSIS — N186 End stage renal disease: Secondary | ICD-10-CM | POA: Diagnosis not present

## 2022-02-14 ENCOUNTER — Encounter: Payer: Self-pay | Admitting: Anesthesiology

## 2022-02-14 DIAGNOSIS — N186 End stage renal disease: Secondary | ICD-10-CM | POA: Diagnosis not present

## 2022-02-14 DIAGNOSIS — E8779 Other fluid overload: Secondary | ICD-10-CM | POA: Diagnosis not present

## 2022-02-14 DIAGNOSIS — Z992 Dependence on renal dialysis: Secondary | ICD-10-CM | POA: Diagnosis not present

## 2022-02-15 DIAGNOSIS — E8779 Other fluid overload: Secondary | ICD-10-CM | POA: Diagnosis not present

## 2022-02-15 DIAGNOSIS — N186 End stage renal disease: Secondary | ICD-10-CM | POA: Diagnosis not present

## 2022-02-15 DIAGNOSIS — Z992 Dependence on renal dialysis: Secondary | ICD-10-CM | POA: Diagnosis not present

## 2022-02-16 DIAGNOSIS — N186 End stage renal disease: Secondary | ICD-10-CM | POA: Diagnosis not present

## 2022-02-16 DIAGNOSIS — Z992 Dependence on renal dialysis: Secondary | ICD-10-CM | POA: Diagnosis not present

## 2022-02-16 DIAGNOSIS — E8779 Other fluid overload: Secondary | ICD-10-CM | POA: Diagnosis not present

## 2022-02-17 DIAGNOSIS — N186 End stage renal disease: Secondary | ICD-10-CM | POA: Diagnosis not present

## 2022-02-17 DIAGNOSIS — Z992 Dependence on renal dialysis: Secondary | ICD-10-CM | POA: Diagnosis not present

## 2022-02-17 DIAGNOSIS — E8779 Other fluid overload: Secondary | ICD-10-CM | POA: Diagnosis not present

## 2022-02-18 ENCOUNTER — Emergency Department (HOSPITAL_COMMUNITY): Payer: BC Managed Care – PPO

## 2022-02-18 ENCOUNTER — Observation Stay (HOSPITAL_COMMUNITY)
Admission: EM | Admit: 2022-02-18 | Discharge: 2022-02-19 | Disposition: A | Discharge status: Home / Self Care | Payer: BC Managed Care – PPO | Attending: Internal Medicine | Admitting: Internal Medicine

## 2022-02-18 ENCOUNTER — Encounter (HOSPITAL_COMMUNITY): Payer: Self-pay

## 2022-02-18 ENCOUNTER — Other Ambulatory Visit: Payer: Self-pay

## 2022-02-18 DIAGNOSIS — E039 Hypothyroidism, unspecified: Secondary | ICD-10-CM | POA: Diagnosis present

## 2022-02-18 DIAGNOSIS — D649 Anemia, unspecified: Secondary | ICD-10-CM | POA: Insufficient documentation

## 2022-02-18 DIAGNOSIS — N186 End stage renal disease: Secondary | ICD-10-CM | POA: Insufficient documentation

## 2022-02-18 DIAGNOSIS — Z992 Dependence on renal dialysis: Secondary | ICD-10-CM

## 2022-02-18 DIAGNOSIS — R55 Syncope and collapse: Secondary | ICD-10-CM | POA: Diagnosis not present

## 2022-02-18 DIAGNOSIS — G40909 Epilepsy, unspecified, not intractable, without status epilepticus: Secondary | ICD-10-CM | POA: Insufficient documentation

## 2022-02-18 DIAGNOSIS — R079 Chest pain, unspecified: Secondary | ICD-10-CM | POA: Diagnosis not present

## 2022-02-18 DIAGNOSIS — N83201 Unspecified ovarian cyst, right side: Secondary | ICD-10-CM | POA: Diagnosis not present

## 2022-02-18 DIAGNOSIS — Z79899 Other long term (current) drug therapy: Secondary | ICD-10-CM | POA: Insufficient documentation

## 2022-02-18 DIAGNOSIS — Z859 Personal history of malignant neoplasm, unspecified: Secondary | ICD-10-CM | POA: Insufficient documentation

## 2022-02-18 DIAGNOSIS — I120 Hypertensive chronic kidney disease with stage 5 chronic kidney disease or end stage renal disease: Secondary | ICD-10-CM | POA: Diagnosis not present

## 2022-02-18 DIAGNOSIS — N83202 Unspecified ovarian cyst, left side: Secondary | ICD-10-CM | POA: Diagnosis not present

## 2022-02-18 DIAGNOSIS — E8779 Other fluid overload: Secondary | ICD-10-CM | POA: Diagnosis not present

## 2022-02-18 DIAGNOSIS — R000 Tachycardia, unspecified: Secondary | ICD-10-CM | POA: Diagnosis not present

## 2022-02-18 DIAGNOSIS — D62 Acute posthemorrhagic anemia: Secondary | ICD-10-CM | POA: Diagnosis present

## 2022-02-18 DIAGNOSIS — N920 Excessive and frequent menstruation with regular cycle: Principal | ICD-10-CM | POA: Diagnosis present

## 2022-02-18 DIAGNOSIS — F17210 Nicotine dependence, cigarettes, uncomplicated: Secondary | ICD-10-CM | POA: Insufficient documentation

## 2022-02-18 DIAGNOSIS — I10 Essential (primary) hypertension: Secondary | ICD-10-CM | POA: Diagnosis present

## 2022-02-18 DIAGNOSIS — N939 Abnormal uterine and vaginal bleeding, unspecified: Secondary | ICD-10-CM | POA: Diagnosis not present

## 2022-02-18 HISTORY — DX: Encounter for other specified aftercare: Z51.89

## 2022-02-18 LAB — TROPONIN I (HIGH SENSITIVITY)
Troponin I (High Sensitivity): 8 ng/L (ref <18)
Troponin I (High Sensitivity): 7 ng/L (ref <18)

## 2022-02-18 LAB — COMPREHENSIVE METABOLIC PANEL
ALT: 12 U/L (ref 0–44)
AST: 11 U/L — ABNORMAL LOW (ref 15–41)
Albumin: 3.5 g/dL (ref 3.5–5.0)
Alkaline Phosphatase: 79 U/L (ref 38–126)
Anion gap: 16 — ABNORMAL HIGH (ref 5–15)
BUN: 43 mg/dL — ABNORMAL HIGH (ref 6–20)
CO2: 26 mmol/L (ref 22–32)
Calcium: 9.5 mg/dL (ref 8.9–10.3)
Chloride: 92 mmol/L — ABNORMAL LOW (ref 98–111)
Creatinine, Ser: 7.42 mg/dL — ABNORMAL HIGH (ref 0.44–1.00)
GFR, Estimated: 7 mL/min — ABNORMAL LOW (ref >60)
Glucose, Bld: 144 mg/dL — ABNORMAL HIGH (ref 70–99)
Potassium: 4.4 mmol/L (ref 3.5–5.1)
Sodium: 134 mmol/L — ABNORMAL LOW (ref 135–145)
Total Bilirubin: 0.5 mg/dL (ref 0.3–1.2)
Total Protein: 6.1 g/dL — ABNORMAL LOW (ref 6.5–8.1)

## 2022-02-18 LAB — CBC
HCT: 15.2 % — ABNORMAL LOW (ref 36.0–46.0)
Hemoglobin: 5.4 g/dL — CL (ref 12.0–15.0)
MCH: 32.1 pg (ref 26.0–34.0)
MCHC: 35.5 g/dL (ref 30.0–36.0)
MCV: 90.5 fL (ref 80.0–100.0)
Platelets: 235 10*3/uL (ref 150–400)
RBC: 1.68 MIL/uL — ABNORMAL LOW (ref 3.87–5.11)
RDW: 14.3 % (ref 11.5–15.5)
WBC: 8.8 10*3/uL (ref 4.0–10.5)
nRBC: 0 % (ref 0.0–0.2)

## 2022-02-18 LAB — URINALYSIS, ROUTINE W REFLEX MICROSCOPIC
Bilirubin Urine: NEGATIVE
Glucose, UA: 250 mg/dL — AB
Ketones, ur: NEGATIVE mg/dL
Nitrite: POSITIVE — AB
Protein, ur: >300 mg/dL — AB
Specific Gravity, Urine: 1.015 (ref 1.005–1.030)
pH: 8.5 — ABNORMAL HIGH (ref 5.0–8.0)

## 2022-02-18 LAB — URINALYSIS, MICROSCOPIC (REFLEX): RBC / HPF: >50 RBC/hpf (ref 0–5)

## 2022-02-18 LAB — PREPARE RBC (CROSSMATCH)

## 2022-02-18 LAB — PROTIME-INR
INR: 1.1 (ref 0.8–1.2)
Prothrombin Time: 14.4 seconds (ref 11.4–15.2)

## 2022-02-18 LAB — I-STAT BETA HCG BLOOD, ED (MC, WL, AP ONLY): I-stat hCG, quantitative: <5 m[IU]/mL (ref <5)

## 2022-02-18 MED ORDER — SODIUM CHLORIDE 0.9% IV SOLUTION: Freq: Once | INTRAVENOUS | Status: AC

## 2022-02-18 MED ORDER — MEGESTROL ACETATE 40 MG PO TABS
80.0000 mg | ORAL_TABLET | Freq: Every day | ORAL | Status: DC
  Administered 2022-02-18 – 2022-02-19 (×2): 80 mg via ORAL
  Filled 2022-02-18 (×2): qty 2

## 2022-02-18 NOTE — ED Triage Notes (Addendum)
Reports on cycle and has been bleeding through blue chux q 20 mins passing clots and now having trouble breathing, chest pain and dizziness.  REports it started getting bad yesterday.  Has been on cycle for 5 days, Patient does home hemo and just finished treatment 20 mins pta.  PAtient roommate reported she had LOC while doing hemodialysis at home.

## 2022-02-19 ENCOUNTER — Encounter (HOSPITAL_COMMUNITY): Payer: Self-pay | Admitting: Internal Medicine

## 2022-02-19 DIAGNOSIS — N186 End stage renal disease: Secondary | ICD-10-CM | POA: Diagnosis not present

## 2022-02-19 DIAGNOSIS — D62 Acute posthemorrhagic anemia: Secondary | ICD-10-CM | POA: Diagnosis not present

## 2022-02-19 DIAGNOSIS — E8779 Other fluid overload: Secondary | ICD-10-CM | POA: Diagnosis not present

## 2022-02-19 DIAGNOSIS — D649 Anemia, unspecified: Secondary | ICD-10-CM | POA: Diagnosis not present

## 2022-02-19 DIAGNOSIS — N920 Excessive and frequent menstruation with regular cycle: Secondary | ICD-10-CM

## 2022-02-19 DIAGNOSIS — E039 Hypothyroidism, unspecified: Secondary | ICD-10-CM

## 2022-02-19 DIAGNOSIS — Z992 Dependence on renal dialysis: Secondary | ICD-10-CM | POA: Diagnosis not present

## 2022-02-19 DIAGNOSIS — I10 Essential (primary) hypertension: Secondary | ICD-10-CM

## 2022-02-19 DIAGNOSIS — I120 Hypertensive chronic kidney disease with stage 5 chronic kidney disease or end stage renal disease: Secondary | ICD-10-CM | POA: Diagnosis not present

## 2022-02-19 LAB — BASIC METABOLIC PANEL
Anion gap: 13 (ref 5–15)
BUN: 49 mg/dL — ABNORMAL HIGH (ref 6–20)
CO2: 25 mmol/L (ref 22–32)
Calcium: 9 mg/dL (ref 8.9–10.3)
Chloride: 95 mmol/L — ABNORMAL LOW (ref 98–111)
Creatinine, Ser: 8.86 mg/dL — ABNORMAL HIGH (ref 0.44–1.00)
GFR, Estimated: 6 mL/min — ABNORMAL LOW (ref >60)
Glucose, Bld: 79 mg/dL (ref 70–99)
Potassium: 4.8 mmol/L (ref 3.5–5.1)
Sodium: 133 mmol/L — ABNORMAL LOW (ref 135–145)

## 2022-02-19 LAB — HEPATITIS B SURFACE ANTIGEN: Hepatitis B Surface Ag: NONREACTIVE

## 2022-02-19 LAB — CBC
HCT: 19.5 % — ABNORMAL LOW (ref 36.0–46.0)
Hemoglobin: 6.7 g/dL — CL (ref 12.0–15.0)
MCH: 30.7 pg (ref 26.0–34.0)
MCHC: 34.4 g/dL (ref 30.0–36.0)
MCV: 89.4 fL (ref 80.0–100.0)
Platelets: 191 10*3/uL (ref 150–400)
RBC: 2.18 MIL/uL — ABNORMAL LOW (ref 3.87–5.11)
RDW: 14.1 % (ref 11.5–15.5)
WBC: 9.6 10*3/uL (ref 4.0–10.5)
nRBC: 0 % (ref 0.0–0.2)

## 2022-02-19 LAB — HEMOGLOBIN AND HEMATOCRIT, BLOOD
HCT: 22.3 % — ABNORMAL LOW (ref 36.0–46.0)
Hemoglobin: 8 g/dL — ABNORMAL LOW (ref 12.0–15.0)

## 2022-02-19 LAB — PREPARE RBC (CROSSMATCH)

## 2022-02-19 LAB — PHOSPHORUS: Phosphorus: 6.3 mg/dL — ABNORMAL HIGH (ref 2.5–4.6)

## 2022-02-19 LAB — HEPATITIS B SURFACE ANTIBODY,QUALITATIVE: Hep B S Ab: REACTIVE — AB

## 2022-02-19 MED ORDER — SODIUM CHLORIDE 0.9% IV SOLUTION: Freq: Once | INTRAVENOUS | Status: AC

## 2022-02-19 MED ORDER — CINACALCET HCL 30 MG PO TABS
60.0000 mg | ORAL_TABLET | Freq: Every day | ORAL | Status: DC
  Filled 2022-02-19: qty 2

## 2022-02-19 MED ORDER — ACETAMINOPHEN 650 MG RE SUPP: 650.0000 mg | Freq: Four times a day (QID) | RECTAL | Status: DC | PRN

## 2022-02-19 MED ORDER — ACETAMINOPHEN 325 MG PO TABS: 650.0000 mg | ORAL_TABLET | Freq: Four times a day (QID) | ORAL | Status: DC | PRN

## 2022-02-19 MED ORDER — ONDANSETRON HCL 4 MG/2ML IJ SOLN: 4.0000 mg | Freq: Four times a day (QID) | INTRAMUSCULAR | Status: DC | PRN

## 2022-02-19 MED ORDER — SUCROFERRIC OXYHYDROXIDE 500 MG PO CHEW
1000.0000 mg | CHEWABLE_TABLET | Freq: Three times a day (TID) | ORAL | Status: DC
Start: 2022-02-19 — End: 2022-02-20
  Filled 2022-02-19 (×2): qty 2

## 2022-02-19 MED ORDER — MEGESTROL ACETATE 40 MG PO TABS: 80.0000 mg | ORAL_TABLET | Freq: Every day | ORAL | 0 refills | Status: DC

## 2022-02-19 MED ORDER — SUCROFERRIC OXYHYDROXIDE 500 MG PO CHEW
500.0000 mg | CHEWABLE_TABLET | Freq: Three times a day (TID) | ORAL | Status: DC
  Administered 2022-02-19 (×2): 500 mg via ORAL
  Filled 2022-02-19 (×3): qty 1

## 2022-02-19 MED ORDER — HEPARIN SODIUM (PORCINE) 1000 UNIT/ML IJ SOLN
INTRAMUSCULAR | Status: AC
  Administered 2022-02-19: 1000 [IU]
  Filled 2022-02-19: qty 4

## 2022-02-19 MED ORDER — CHLORHEXIDINE GLUCONATE CLOTH 2 % EX PADS: 6.0000 | MEDICATED_PAD | Freq: Every day | CUTANEOUS | Status: DC

## 2022-02-19 MED ORDER — CARVEDILOL 12.5 MG PO TABS
12.5000 mg | ORAL_TABLET | Freq: Two times a day (BID) | ORAL | Status: DC
  Administered 2022-02-19 (×2): 12.5 mg via ORAL
  Filled 2022-02-19 (×2): qty 1

## 2022-02-19 MED ORDER — GABAPENTIN 100 MG PO CAPS
100.0000 mg | ORAL_CAPSULE | Freq: Three times a day (TID) | ORAL | Status: DC
  Administered 2022-02-19 (×2): 100 mg via ORAL
  Filled 2022-02-19 (×3): qty 1

## 2022-02-19 MED ORDER — AMLODIPINE BESYLATE 5 MG PO TABS
10.0000 mg | ORAL_TABLET | Freq: Every day | ORAL | Status: DC
  Administered 2022-02-19: 10 mg via ORAL
  Filled 2022-02-19: qty 2

## 2022-02-19 MED ORDER — ACYCLOVIR 200 MG PO CAPS: 200.0000 mg | ORAL_CAPSULE | Freq: Two times a day (BID) | ORAL | Status: DC | PRN

## 2022-02-19 MED ORDER — ONDANSETRON HCL 4 MG PO TABS: 4.0000 mg | ORAL_TABLET | Freq: Four times a day (QID) | ORAL | Status: DC | PRN

## 2022-02-19 MED ORDER — LEVOTHYROXINE SODIUM 100 MCG PO TABS
200.0000 ug | ORAL_TABLET | Freq: Every day | ORAL | Status: DC
  Administered 2022-02-19: 200 ug via ORAL
  Filled 2022-02-19: qty 2

## 2022-02-19 MED ORDER — LORATADINE 10 MG PO TABS: 10.0000 mg | ORAL_TABLET | Freq: Every day | ORAL | Status: DC

## 2022-02-19 NOTE — ED Notes (Signed)
Pt in room A&O x4 Denies any pain. Reports feeling better after transfusions. Breakfast tray at bedside. Updated on plan of care.

## 2022-02-19 NOTE — Procedures (Signed)
I was present at this dialysis session. I have reviewed the session itself and made appropriate changes.   Patient tolerating well. Hospitalist notified that she may leave after HD.  Filed Weights   02/18/22 1701  Weight: 96.2 kg    Recent Labs  Lab 02/19/22 0408  NA 133*  K 4.8  CL 95*  CO2 25  GLUCOSE 79  BUN 49*  CREATININE 8.86*  CALCIUM 9.0    Recent Labs  Lab 02/18/22 1715 02/19/22 0408  WBC 8.8 9.6  HGB 5.4* 6.7*  HCT 15.2* 19.5*  MCV 90.5 89.4  PLT 235 191    Scheduled Meds:  amLODipine  10 mg Oral Daily   carvedilol  12.5 mg Oral BID WC   Chlorhexidine Gluconate Cloth  6 each Topical Q0600   cinacalcet  60 mg Oral QHS   gabapentin  100 mg Oral TID   levothyroxine  200 mcg Oral Q0600   loratadine  10 mg Oral QHS   megestrol  80 mg Oral Daily   sucroferric oxyhydroxide  1,000 mg Oral TID WC   Continuous Infusions: PRN Meds:.acetaminophen **OR** acetaminophen, acyclovir, ondansetron **OR** ondansetron (ZOFRAN) IV   Santiago Bumpers,  MD 02/19/2022, 12:32 PM

## 2022-02-19 NOTE — ED Notes (Signed)
Discharge instructions reviewed with patient. Patient verbalized understanding of instructions. Follow-up care and medications were reviewed. Patient ambulatory with steady gait. VSS upon discharge.  ?

## 2022-02-19 NOTE — Assessment & Plan Note (Signed)
Cont home synthroid. 

## 2022-02-19 NOTE — Progress Notes (Signed)
Received patient in bed, alert and oriented. Informed consent signed and in chart.  Time tx completed:1635  HD treatment completed. Patient tolerated well. HD catheter without signs and symptoms of complications. Patient transported back to the room, alert and orient and in no acute distress. Report given to bedside RN.  Total UF removed:2500  Medication given:n/a  Post HD VS:97.9,154/104,15,100%  Post HD weight: 96.1kg

## 2022-02-19 NOTE — Assessment & Plan Note (Signed)
ABLA from menorrhagia superimposed on anemia of CKD HGB 5.4 down from baseline 8.0 x3 weeks ago. 1. 2u PRBC transfusion ordered 2. Repeat CBC in AM 3. Tele monitor

## 2022-02-19 NOTE — Assessment & Plan Note (Signed)
BP still elevated, cont home BP meds

## 2022-02-19 NOTE — Assessment & Plan Note (Addendum)
1. OB/GYN consult in chart 1. Megace started per their recs. 2. TVUS negative 3. F/u outpt, no plans for child bearing so considering if hysterectomy is option.

## 2022-02-19 NOTE — Assessment & Plan Note (Signed)
Due to MPGN, currently on home HD nightly through a temp catheter it seems. 1. Spoke with Dr. Candiss Norse: 1. Call him back if she gets fluid overloaded with PRBC transfusions tonight 2. Otherwise planning on getting dialysis started later today

## 2022-02-19 NOTE — Discharge Summary (Signed)
Physician Discharge Summary   Patient: Holly [name] MRN: [id number] DOB: February 16, 1986  Admit date:     02/18/2022  Discharge date: 02/19/22  Discharge Physician: Jennye Boroughs   PCP: Pcp, No   Recommendations at discharge:   Follow-up with PCP in 1 week Follow-up with gynecologist in 1 to 2 weeks  Discharge Diagnoses: Principal Problem:   Menorrhagia Active Problems:   ABLA (acute blood loss anemia)   ESRD on dialysis [facility])   Essential hypertension   Hypothyroidism  Resolved Problems:   * No resolved hospital problems. [facility] Course:  Ms. 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  This occurred in the setting of heavy menstrual bleeding.  She was found to have severe anemia with a hemoglobin of 5.7.  She was transfused with 3 units of packed red blood cells.  She was evaluated by the gynecologist who recommended Megace for menorrhagia. Case discussed with Dr. Jeannetta Nap, gynecologist.  She said patient should be discharged on 80 mg daily.  This was discussed with patient.  Side effects of medication including risk of venous thromboembolism (DVT and pulmonary embolism) were discussed with the patient.  She said the medicine helped her menorrhagia and she thinks she will be okay.  She was evaluated by the nephrologist.  She underwent hemodialysis.  Her condition has improved and she is still stable for discharge to home today.        Consultants: Nephrologist, gynecologist Procedures performed: None Disposition: Home Diet recommendation:  Renal diet DISCHARGE MEDICATION: Allergies as of 02/19/2022       Reactions   Bupropion Other (See Comments), Anaphylaxis   Caused seizures Caused seizures Medical Record H&P Medical Record H&P Contributed to  seizures Other reaction(s): Other (See Comments) Medical Record H&P Medical Record H&P Medical Record H&P   Ciprofloxacin Other (See Comments), Anaphylaxis   Caused Seizures  Caused Seizures  Seizures  Seizures  Other reaction(s): Other (See Comments) Seizures  Seizures  Seizures    Levetiracetam Other (See Comments), Anaphylaxis   Medical Record H&P (unknown reaction) Medical Record H&P (unknown reaction) Medical Record H&P Medical Record H&P Other reaction(s): Other (See Comments) Medical Record H&P Medical Record H&P Medical Record H&P   Rituximab Anaphylaxis, Swelling   Tramadol Other (See Comments), Anaphylaxis   Caused seizures Other reaction(s): Other (See Comments) Caused seizures Medical Record H&P Medical Record H&P Other reaction(s): Other (See Comments) Medical Record H&P Medical Record H&P Medical Record H&P   Fentanyl    Patient reports that with surgery she is difficult to wake up   Oxycodone-acetaminophen Nausea Only        Medication List     TAKE these medications    acetaminophen 500 MG tablet Commonly known as: TYLENOL Take 1,000 mg by mouth every 6 (six) hours as needed for headache (pain).   acyclovir 200 MG capsule Commonly known as: ZOVIRAX Take 200 mg by mouth 2 (two) times daily as needed (out breaks).   amLODipine 10 MG tablet Commonly known as: NORVASC Take 1 tablet (10 mg total) by mouth daily.   carvedilol 12.5 MG tablet Commonly known as: COREG Take 12.5 mg by mouth 2 (two) times daily with a meal.   cetirizine 10 MG tablet Commonly known as: ZYRTEC Take 10 mg by mouth at bedtime.   cinacalcet 60  MG tablet Commonly known as: SENSIPAR Take 60 mg by mouth at bedtime.   diclofenac Sodium 1 % Gel Commonly known as: VOLTAREN Apply 1 application  topically 4 (four) times daily as needed (hand,feet,thighs and back pain).   gabapentin 100 MG capsule Commonly known as: NEURONTIN Take 100 mg by mouth 3 (three) times  daily.   levothyroxine 200 MCG tablet Commonly known as: SYNTHROID Take 200 mcg by mouth daily before breakfast.   megestrol 40 MG tablet Commonly known as: MEGACE Take 2 tablets (80 mg total) by mouth daily. Start taking on: February 20, 2022   ondansetron 4 MG disintegrating tablet Commonly known as: ZOFRAN-ODT Take 4 mg by mouth every 8 (eight) hours as needed for nausea or vomiting.   Velphoro 500 MG chewable tablet Generic drug: sucroferric oxyhydroxide Chew 500 mg by mouth 3 (three) times daily with meals.        Discharge Exam: Filed Weights   02/18/22 1701 02/19/22 1252 02/19/22 1632  Weight: 96.2 kg 98.7 kg 96.1 kg   GEN: NAD SKIN: Warm and dry.  Permacath on right upper chest EYES: Pale but anicteric ENT: MMM CV: RRR PULM: CTA B ABD: soft, obese, NT, +BS CNS: AAO x 3, non focal EXT: No edema or tenderness   Condition at discharge: good  The results of significant diagnostics from this hospitalization (including imaging, microbiology, ancillary and laboratory) are listed below for reference.   Imaging Studies: US Pelvis Complete  Result Date: 02/18/2022 CLINICAL DATA:  Vaginal bleeding EXAM: TRANSABDOMINAL AND TRANSVAGINAL ULTRASOUND OF PELVIS DOPPLER ULTRASOUND OF OVARIES TECHNIQUE: Both transabdominal and transvaginal ultrasound examinations of the pelvis were performed. Transabdominal technique was performed for global imaging of the pelvis including uterus, ovaries, adnexal regions, and pelvic cul-de-sac. It was necessary to proceed with endovaginal exam following the transabdominal exam to visualize the uterus, endometrium, ovaries and adnexa. Color and duplex Doppler ultrasound was utilized to evaluate blood flow to the ovaries. COMPARISON:  None Available. FINDINGS: Uterus Measurements: 7.4 x 3.8 x 6.0 cm = volume: 88 mL. No fibroids or other mass visualized. Endometrium Thickness: 12 mm in thickness.  No focal abnormality visualized. Right ovary  Measurements: 4.3 x 2.1 x 3.4 cm. = volume: 18 mL. 2.9 cm and 2.3 cm cysts or dominant follicles in the right ovary. Left ovary Measurements: 5.1 x 4.1 x 5.8 cm = volume: 56 mL. X 3.7 cm simple appearing cyst Pulsed Doppler evaluation of both ovaries demonstrates normal low-resistance arterial and venous waveforms. Other findings No abnormal free fluid. IMPRESSION: Bilateral ovarian cysts, the largest on the left measuring up to 3.7 cm. No evidence of torsion. No follow up imaging recommended. Note: This recommendation does not apply to premenarchal patients or to those with increased risk (genetic, family history, elevated tumor markers or other high-risk factors) of ovarian cancer. Reference: Radiology 2019 Nov; 293(2):359-371. Electronically Signed   By: Rolm Baptise M.D.   On: 02/18/2022 21:20   US Transvaginal Non-OB  Result Date: 02/18/2022 CLINICAL DATA:  Vaginal bleeding EXAM: TRANSABDOMINAL AND TRANSVAGINAL ULTRASOUND OF PELVIS DOPPLER ULTRASOUND OF OVARIES TECHNIQUE: Both transabdominal and transvaginal ultrasound examinations of the pelvis were performed. Transabdominal technique was performed for global imaging of the pelvis including uterus, ovaries, adnexal regions, and pelvic cul-de-sac. It was necessary to proceed with endovaginal exam following the transabdominal exam to visualize the uterus, endometrium, ovaries and adnexa. Color and duplex Doppler ultrasound was utilized to evaluate blood flow to the ovaries. COMPARISON:  None Available. FINDINGS: Uterus Measurements:  7.4 x 3.8 x 6.0 cm = volume: 88 mL. No fibroids or other mass visualized. Endometrium Thickness: 12 mm in thickness.  No focal abnormality visualized. Right ovary Measurements: 4.3 x 2.1 x 3.4 cm. = volume: 18 mL. 2.9 cm and 2.3 cm cysts or dominant follicles in the right ovary. Left ovary Measurements: 5.1 x 4.1 x 5.8 cm = volume: 56 mL. X 3.7 cm simple appearing cyst Pulsed Doppler evaluation of both ovaries demonstrates normal  low-resistance arterial and venous waveforms. Other findings No abnormal free fluid. IMPRESSION: Bilateral ovarian cysts, the largest on the left measuring up to 3.7 cm. No evidence of torsion. No follow up imaging recommended. Note: This recommendation does not apply to premenarchal patients or to those with increased risk (genetic, family history, elevated tumor markers or other high-risk factors) of ovarian cancer. Reference: Radiology 2019 Nov; 293(2):359-371. Electronically Signed   By: Rolm Baptise M.D.   On: 02/18/2022 21:20   [place] Art/Ven Flow Abd Pelv Doppler  Result Date: 02/18/2022 CLINICAL DATA:  Vaginal bleeding EXAM: TRANSABDOMINAL AND TRANSVAGINAL ULTRASOUND OF PELVIS DOPPLER ULTRASOUND OF OVARIES TECHNIQUE: Both transabdominal and transvaginal ultrasound examinations of the pelvis were performed. Transabdominal technique was performed for global imaging of the pelvis including uterus, ovaries, adnexal regions, and pelvic cul-de-sac. It was necessary to proceed with endovaginal exam following the transabdominal exam to visualize the uterus, endometrium, ovaries and adnexa. Color and duplex Doppler ultrasound was utilized to evaluate blood flow to the ovaries. COMPARISON:  None Available. FINDINGS: Uterus Measurements: 7.4 x 3.8 x 6.0 cm = volume: 88 mL. No fibroids or other mass visualized. Endometrium Thickness: 12 mm in thickness.  No focal abnormality visualized. Right ovary Measurements: 4.3 x 2.1 x 3.4 cm. = volume: 18 mL. 2.9 cm and 2.3 cm cysts or dominant follicles in the right ovary. Left ovary Measurements: 5.1 x 4.1 x 5.8 cm = volume: 56 mL. X 3.7 cm simple appearing cyst Pulsed Doppler evaluation of both ovaries demonstrates normal low-resistance arterial and venous waveforms. Other findings No abnormal free fluid. IMPRESSION: Bilateral ovarian cysts, the largest on the left measuring up to 3.7 cm. No evidence of torsion. No follow up imaging recommended. Note: This recommendation does  not apply to premenarchal patients or to those with increased risk (genetic, family history, elevated tumor markers or other high-risk factors) of ovarian cancer. Reference: Radiology 2019 Nov; 293(2):359-371. Electronically Signed   By: Rolm Baptise M.D.   On: 02/18/2022 21:20   DG Chest 2 View  Result Date: 02/18/2022 CLINICAL DATA:  Chest pain EXAM: CHEST - 2 VIEW COMPARISON:  11/09/2018 FINDINGS: Transverse diameter of heart is slightly increased. There are no signs of pulmonary edema or focal pulmonary consolidation. There is no pleural effusion or pneumothorax. Tip of dialysis catheter is seen in the right atrium. IMPRESSION: No active cardiopulmonary disease. Electronically Signed   By: Elmer Picker M.D.   On: 02/18/2022 18:23    Microbiology: Results for orders placed or performed during the hospital encounter of 01/30/22  MRSA Next Gen by PCR, Nasal     Status: None   Collection Time: 01/30/22  5:25 PM   Specimen: Nasal Mucosa; Nasal Swab  Result Value Ref Range Status   MRSA by PCR Next Gen NOT DETECTED NOT DETECTED Final    Comment: (NOTE) The GeneXpert MRSA Assay (FDA approved for NASAL specimens only), is one component of a comprehensive MRSA colonization surveillance program. It is not intended to diagnose MRSA infection nor to guide or  monitor treatment for MRSA infections. Test performance is not FDA approved in patients less than 10 years old. Performed at [facility], [address] [place], [place] [address]     Labs: CBC: Recent Labs  Lab 02/18/22 1715 02/19/22 0408 02/19/22 1819  WBC 8.8 9.6  --   HGB 5.4* 6.7* 8.0*  HCT 15.2* 19.5* 22.3*  MCV 90.5 89.4  --   PLT 235 191  --    Basic Metabolic Panel: Recent Labs  Lab 02/18/22 1715 02/19/22 0408  NA 134* 133*  K 4.4 4.8  CL 92* 95*  CO2 26 25  GLUCOSE 144* 79  BUN 43* 49*  CREATININE 7.42* 8.86*  CALCIUM 9.5 9.0  PHOS  --  6.3*   Liver Function Tests: Recent Labs  Lab  02/18/22 1715  AST 11*  ALT 12  ALKPHOS 79  BILITOT 0.5  PROT 6.1*  ALBUMIN 3.5   CBG: No results for input(s): "GLUCAP" in the last 168 hours.  Discharge time spent: greater than 30 minutes.  Signed: Jennye Boroughs, MD [facility] 02/19/2022

## 2022-02-19 NOTE — H&P (Signed)
History and Physical    Patient: Holly [name] HYW:[id number] DOB: 02-25-1986 DOA: 02/18/2022 DOS: the patient was seen and examined on 02/19/2022 PCP: Pcp, No  Patient coming from: Home  Chief Complaint:  Chief Complaint  Patient presents with   Near Syncope   HPI: Holly [name] is a 36 y.o. female with medical history significant of ESRD in context of MPGN.  Pt on home HD through a temp line, does home HD on a daily basis.  Pt presents to ED with CP, SOB, dizziness.  This occurring in setting of mensturation with very heavy bleeding.  Has been on cycle for 5 days now.  H/o menorrhagia in past it seems.  She does have monthly regular periods but about every 3 months she has an episode where the bleeding is much heavier.  Systemic symptoms of CP, SOB, and dizziness started today.  Review of Systems: As mentioned in the history of present illness. All other systems reviewed and are negative. Past Medical History:  Diagnosis Date   Anemia    Anxiety    Arthritis    hands   Blood transfusion without reported diagnosis    Cancer ([place])    Complication of anesthesia    with Fentanyl she is difficult to awake   Depression    Dialysis patient ([place])    Dyspnea    due to anemia   Epilepsy ([place])    Hashimoto's disease    History of kidney stones    HLD (hyperlipidemia)    Hypertension    Hypothyroidism    Pneumonia    Renal disorder    Thyroid disease    Past Surgical History:  Procedure Laterality Date   ABDOMINAL SURGERY     AV FISTULA PLACEMENT Left 01/17/2022   Procedure: ARTERIOVENOUS (AV) FISTULA CREATION ( RADIAL CEPHALIC);  Surgeon: Katha Cabal, MD;  Location: [facility] ORS;  Service: Vascular;  Laterality: Left;   CERVICAL BIOPSY  W/ LOOP ELECTRODE EXCISION  2018   COLONOSCOPY N/A 01/30/2022   Procedure: COLONOSCOPY;  Surgeon: Annamaria Helling, DO;  Location: [facility] ENDOSCOPY;  Service: Gastroenterology;  Laterality: N/A;   COLONOSCOPY WITH PROPOFOL N/A  02/01/2022   Procedure: COLONOSCOPY WITH PROPOFOL;  Surgeon: Lesly Rubenstein, MD;  Location: [facility] ENDOSCOPY;  Service: Endoscopy;  Laterality: N/A;   DIAGNOSTIC LAPAROSCOPY     due to removal of PD catheters   ESOPHAGOGASTRODUODENOSCOPY N/A 01/30/2022   Procedure: ESOPHAGOGASTRODUODENOSCOPY (EGD);  Surgeon: Annamaria Helling, DO;  Location: [facility] ENDOSCOPY;  Service: Gastroenterology;  Laterality: N/A;   ESOPHAGOGASTRODUODENOSCOPY (EGD) WITH PROPOFOL N/A 02/01/2022   Procedure: ESOPHAGOGASTRODUODENOSCOPY (EGD) WITH PROPOFOL;  Surgeon: Lesly Rubenstein, MD;  Location: [facility] ENDOSCOPY;  Service: Endoscopy;  Laterality: N/A;   RENAL BIOPSY     x 2   TUMOR REMOVAL     right face   Social History:  reports that she has been smoking cigarettes. She has never used smokeless tobacco. She reports that she does not drink alcohol and does not use drugs.  Allergies  Allergen Reactions   Bupropion Other (See Comments) and Anaphylaxis    Caused seizures Caused seizures Medical Record H&P Medical Record H&P Contributed to seizures Other reaction(s): Other (See Comments) Medical Record H&P Medical Record H&P Medical Record H&P   Ciprofloxacin Other (See Comments) and Anaphylaxis    Caused Seizures  Caused Seizures  Seizures  Seizures  Other reaction(s): Other (See Comments) Seizures  Seizures  Seizures    Levetiracetam Other (See Comments) and  Anaphylaxis    Medical Record H&P (unknown reaction) Medical Record H&P (unknown reaction) Medical Record H&P Medical Record H&P Other reaction(s): Other (See Comments) Medical Record H&P Medical Record H&P Medical Record H&P   Rituximab Anaphylaxis and Swelling   Tramadol Other (See Comments) and Anaphylaxis    Caused seizures Other reaction(s): Other (See Comments) Caused seizures Medical Record H&P Medical Record H&P Other reaction(s): Other (See Comments) Medical Record H&P Medical Record H&P Medical Record H&P   Fentanyl      Patient reports that with surgery she is difficult to wake up   Oxycodone-Acetaminophen Nausea Only    Family History  Problem Relation Age of Onset   Multiple sclerosis Mother    Cancer Father    COPD Father     Prior to Admission medications   Medication Sig Start Date End Date Taking? Authorizing Provider  acetaminophen (TYLENOL) 500 MG tablet Take 1,000 mg by mouth every 6 (six) hours as needed for headache (pain).   Yes [provider]  acyclovir (ZOVIRAX) 200 MG capsule Take 200 mg by mouth 2 (two) times daily as needed (out breaks). 08/14/21  Yes [provider]  amLODipine (NORVASC) 10 MG tablet Take 1 tablet (10 mg total) by mouth daily. 02/02/22  Yes Wieting, Richard, MD  carvedilol (COREG) 12.5 MG tablet Take 12.5 mg by mouth 2 (two) times daily with a meal.   Yes [provider]  cetirizine (ZYRTEC) 10 MG tablet Take 10 mg by mouth at bedtime.   Yes [provider]  cinacalcet (SENSIPAR) 60 MG tablet Take 60 mg by mouth at bedtime.   Yes [provider]  diclofenac Sodium (VOLTAREN) 1 % GEL Apply 1 application  topically 4 (four) times daily as needed (hand,feet,thighs and back pain).   Yes [provider]  gabapentin (NEURONTIN) 100 MG capsule Take 100 mg by mouth 3 (three) times daily. 01/15/22  Yes [provider]  levothyroxine (SYNTHROID) 200 MCG tablet Take 200 mcg by mouth daily before breakfast.   Yes [provider]  ondansetron (ZOFRAN-ODT) 4 MG disintegrating tablet Take 4 mg by mouth every 8 (eight) hours as needed for nausea or vomiting. 01/08/22  Yes [provider]  VELPHORO 500 MG chewable tablet Chew 500 mg by mouth 3 (three) times daily with meals. 02/12/22  Yes [provider]    Physical Exam: Vitals:   02/18/22 2245 02/18/22 2330 02/19/22 0015 02/19/22 0035  BP: (!) 131/106 (!) 165/99 133/83 (!) 156/97  Pulse: 97 (!) 104 (!) 101 (!) 102  Resp: '17 16 18 16  '$ Temp: 98.6  F (37 C)   98.7 F (37.1 C)  TempSrc: Oral   Oral  SpO2: 100% 100% 100% 100%  Weight:      Height:       Constitutional: NAD, calm, comfortable Eyes: PERRL, lids and conjunctivae normal ENMT: Mucous membranes are moist. Posterior pharynx clear of any exudate or lesions.Normal dentition.  Neck: normal, supple, no masses, no thyromegaly Respiratory: clear to auscultation bilaterally, no wheezing, no crackles. Normal respiratory effort. No accessory muscle use.  Cardiovascular: Tachy with HR 101 Abdomen: no tenderness, no masses palpated. No hepatosplenomegaly. Bowel sounds positive.  Musculoskeletal: no clubbing / cyanosis. No joint deformity upper and lower extremities. Good ROM, no contractures. Normal muscle tone.  Skin: Pale Neurologic: CN 2-12 grossly intact. Sensation intact, DTR normal. Strength 5/5 in all 4.  Psychiatric: Normal judgment and insight. Alert and oriented x 3. Normal mood.   Data  Reviewed:       Latest Ref Rng & Units 02/18/2022    5:15 PM 02/02/2022    7:19 AM 02/01/2022    6:42 AM  CBC  WBC 4.0 - 10.5 K/uL 8.8   6.3   Hemoglobin 12.0 - 15.0 g/dL 5.4  7.6  8.2   Hematocrit 36.0 - 46.0 % 15.2   23.3   Platelets 150 - 400 K/uL 235   155       Latest Ref Rng & Units 02/18/2022    5:15 PM 02/02/2022    7:19 AM 02/01/2022    6:42 AM  BMP  Glucose 70 - 99 mg/dL 144  85  79   BUN 6 - 20 mg/dL 43  26  35   Creatinine 0.44 - 1.00 mg/dL 7.42  7.67  9.88   Sodium 135 - 145 mmol/L 134  130  132   Potassium 3.5 - 5.1 mmol/L 4.4  4.6  5.5   Chloride 98 - 111 mmol/L 92  93  94   CO2 22 - 32 mmol/L '26  27  26   '$ Calcium 8.9 - 10.3 mg/dL 9.5  8.4  9.2    CXR = no acute process  Trop neg  Assessment and Plan: * Menorrhagia OB/GYN consult in chart Megace started per their recs. TVUS negative F/u outpt, no plans for child bearing so considering if hysterectomy is option.  ABLA (acute blood loss anemia) ABLA from menorrhagia superimposed on anemia of CKD HGB  5.4 down from baseline 8.0 x3 weeks ago. 2u PRBC transfusion ordered Repeat CBC in AM Tele monitor  ESRD on dialysis [facility]) Due to MPGN, currently on home HD nightly through a temp catheter it seems. Spoke with Dr. Candiss Norse: Call him back if she gets fluid overloaded with PRBC transfusions tonight Otherwise planning on getting dialysis started later today  Essential hypertension BP still elevated, cont home BP meds  Hypothyroidism Cont home synthroid      Advance Care Planning:   Code Status: Full Code  Consults: OB/GYN and Dr. Candiss Norse (nephrology).  Family Communication: No family in room  Severity of Illness: The appropriate patient status for this patient is OBSERVATION. Observation status is judged to be reasonable and necessary in order to provide the required intensity of service to ensure the patient's safety. The patient's presenting symptoms, physical exam findings, and initial radiographic and laboratory data in the context of their medical condition is felt to place them at decreased risk for further clinical deterioration. Furthermore, it is anticipated that the patient will be medically stable for discharge from the hospital within 2 midnights of admission.   Author: Etta Quill., DO 02/19/2022 1:14 AM  For on call review [email or web address].

## 2022-02-19 NOTE — ED Notes (Addendum)
Orders placed for this pt to be discharged by MD [facility]. This RN clarified with MD Poplawski to verify that pt will be discharged from ED. Md verified that patient does have an order for discharge and can be dispo out at this time.

## 2022-02-19 NOTE — Consult Note (Signed)
ESRD Consult Note  Requesting provider: Jennette Kettle Service requesting consult: Hospitalist Reason for consult: ESRD, provision of dialysis Indication for acute dialysis?: End Stage Renal Disease  Outpatient dialysis unit: Home w/ [facility]; Dr. Candiss Norse  Assessment/Recommendations: Holly [name] is a/an 36 y.o. female with a past medical history notable for ESRD on HD admitted with anemia.   # ESRD: HD today and then likely MWF while she is here. Will create prescription based on labs.  # Volume/ hypertension: EDW 92kg?Marland Kitchen Attempt to achieve EDW as tolerated. Hold home BP meds but can restart as she stabilizes  # Anemia of Chronic Kidney Disease: Also with blood loss from menorrhagia. OBGYN consulted and recommended Megace. Transfusions per primary team. Unclear outpatient ESA regimen.  # Secondary Hyperparathyroidism/Hyperphosphatemia: she is on lanthanum 1g TID and sensipar '50mg'$  daily here. Can continue  # Vascular access: TDC with no issues.   # Additional recommendations: - Dose all meds for creatinine clearance < 10 ml/min  - Unless absolutely necessary, no MRIs with gadolinium.  - Implement save arm precautions.  Prefer needle sticks in the dorsum of the hands or wrists.  No blood pressure measurements in arm. - If blood transfusion is requested during hemodialysis sessions, please alert [place] prior to the session.  - Use synthetic opioids (Fentanyl/Dilaudid) if needed  Recommendations were discussed with the primary team.   History of Present Illness: Holly [name] is a/an 36 y.o. female with a past medical history of ESRD who presents with menorrhagia.   Patient presented to the hospital because of some chest pain, shortness of breath, dizziness.  She has been having significant bleeding for some time related to menstruation.  She has been having some significant bleeding during her cycle at this time.  She has been on dialysis for about 3 years and does home dialysis.   She typically does about 1-1/2 to 2 hours every day to help her with her work schedule.  She does not have significant problems with dialysis.  Think she is about 2 to 3 L above where she needs to be. In the ER her hgb was 5.4.  Medications:  Current Facility-Administered Medications  Medication Dose Route Frequency Provider Last Rate Last Admin   acetaminophen (TYLENOL) tablet 650 mg  650 mg Oral Q6H PRN Etta Quill, DO       Or   acetaminophen (TYLENOL) suppository 650 mg  650 mg Rectal Q6H PRN Etta Quill, DO       acyclovir (ZOVIRAX) 200 MG capsule 200 mg  200 mg Oral BID PRN Etta Quill, DO       amLODipine (NORVASC) tablet 10 mg  10 mg Oral Daily Jennette Kettle M, DO   10 mg at 02/19/22 7425   carvedilol (COREG) tablet 12.5 mg  12.5 mg Oral BID WC Etta Quill, DO   12.5 mg at 02/19/22 0846   Chlorhexidine Gluconate Cloth 2 % PADS 6 each  6 each Topical Q0600 Reesa Chew, MD       cinacalcet [facility]) tablet 60 mg  60 mg Oral QHS Jennette Kettle M, DO       gabapentin (NEURONTIN) capsule 100 mg  100 mg Oral TID Etta Quill, DO   100 mg at 02/19/22 9563   levothyroxine (SYNTHROID) tablet 200 mcg  200 mcg Oral Q0600 Etta Quill, DO   200 mcg at 02/19/22 0529   loratadine (CLARITIN) tablet 10 mg  10 mg Oral QHS Etta Quill,  DO       megestrol (MEGACE) tablet 80 mg  80 mg Oral Daily Kommor, [place], MD   80 mg at 02/19/22 0923   ondansetron (ZOFRAN) tablet 4 mg  4 mg Oral Q6H PRN Etta Quill, DO       Or   ondansetron [facility]) injection 4 mg  4 mg Intravenous Q6H PRN Etta Quill, DO       sucroferric oxyhydroxide [facility]) chewable tablet 500 mg  500 mg Oral TID WC Etta Quill, DO   500 mg at 02/19/22 1109   Current Outpatient Medications  Medication Sig Dispense Refill   acetaminophen (TYLENOL) 500 MG tablet Take 1,000 mg by mouth every 6 (six) hours as needed for headache (pain).     acyclovir (ZOVIRAX) 200 MG capsule Take 200 mg by  mouth 2 (two) times daily as needed (out breaks).     amLODipine (NORVASC) 10 MG tablet Take 1 tablet (10 mg total) by mouth daily. 30 tablet 0   carvedilol (COREG) 12.5 MG tablet Take 12.5 mg by mouth 2 (two) times daily with a meal.     cetirizine (ZYRTEC) 10 MG tablet Take 10 mg by mouth at bedtime.     cinacalcet (SENSIPAR) 60 MG tablet Take 60 mg by mouth at bedtime.     diclofenac Sodium (VOLTAREN) 1 % GEL Apply 1 application  topically 4 (four) times daily as needed (hand,feet,thighs and back pain).     gabapentin (NEURONTIN) 100 MG capsule Take 100 mg by mouth 3 (three) times daily.     levothyroxine (SYNTHROID) 200 MCG tablet Take 200 mcg by mouth daily before breakfast.     ondansetron (ZOFRAN-ODT) 4 MG disintegrating tablet Take 4 mg by mouth every 8 (eight) hours as needed for nausea or vomiting.     VELPHORO 500 MG chewable tablet Chew 500 mg by mouth 3 (three) times daily with meals.       ALLERGIES Bupropion, Ciprofloxacin, Levetiracetam, Rituximab, Tramadol, Fentanyl, and Oxycodone-acetaminophen  MEDICAL HISTORY Past Medical History:  Diagnosis Date   Anemia    Anxiety    Arthritis    hands   Blood transfusion without reported diagnosis    Cancer ([place])    Complication of anesthesia    with Fentanyl she is difficult to awake   Depression    Dialysis patient ([place])    Dyspnea    due to anemia   Epilepsy ([place])    Hashimoto's disease    History of kidney stones    HLD (hyperlipidemia)    Hypertension    Hypothyroidism    Pneumonia    Renal disorder    Thyroid disease      SOCIAL HISTORY Social History   Socioeconomic History   Marital status: Significant Other    Spouse name: [name]   Number of children: Not on file   Years of education: Not on file   Highest education level: Not on file  Occupational History   Not on file  Tobacco Use   Smoking status: Some Days    Types: Cigarettes   Smokeless tobacco: Never  Vaping Use   Vaping Use: Never used   Substance and Sexual Activity   Alcohol use: Never   Drug use: Never   Sexual activity: Not on file  Other Topics Concern   Not on file  Social History Narrative   Not on file   Social Determinants of Health   Financial Resource Strain: Not on file  Food  Insecurity: Not on file  Transportation Needs: Not on file  Physical Activity: Not on file  Stress: Not on file  Social Connections: Not on file  Intimate Partner Violence: Not on file     FAMILY HISTORY Family History  Problem Relation Age of Onset   Multiple sclerosis Mother    Cancer Father    COPD Father      Review of Systems: 12 systems were reviewed and negative except per HPI  Physical Exam: Vitals:   02/19/22 1115 02/19/22 1115  BP: (!) 151/76 (!) 151/76  Pulse: 99 88  Resp: 19 16  Temp:  98.6 F (37 C)  SpO2: 98% 100%   Total I/O In: 415 [I.V.:100; Blood:315] Out: -   Intake/Output Summary (Last 24 hours) at 02/19/2022 1158 Last data filed at 02/19/2022 1114 Gross per 24 hour  Intake 415 ml  Output --  Net 415 ml   General: well-appearing, no acute distress HEENT: anicteric sclera, MMM CV: normal rate, no murmurs, trace edema Lungs: bilateral chest rise, normal wob Abd: soft, non-tender, non-distended Skin: no visible lesions or rashes Psych: alert, engaged, appropriate mood and affect Neuro: normal speech, no gross focal deficits   Test Results Reviewed Lab Results  Component Value Date   NA 133 (L) 02/19/2022   K 4.8 02/19/2022   CL 95 (L) 02/19/2022   CO2 25 02/19/2022   BUN 49 (H) 02/19/2022   CREATININE 8.86 (H) 02/19/2022   CALCIUM 9.0 02/19/2022   ALBUMIN 3.5 02/18/2022   PHOS 6.9 (H) 01/31/2022    I have reviewed relevant outside healthcare records

## 2022-02-20 DIAGNOSIS — E8779 Other fluid overload: Secondary | ICD-10-CM | POA: Diagnosis not present

## 2022-02-20 DIAGNOSIS — N186 End stage renal disease: Secondary | ICD-10-CM | POA: Diagnosis not present

## 2022-02-20 DIAGNOSIS — Z992 Dependence on renal dialysis: Secondary | ICD-10-CM | POA: Diagnosis not present

## 2022-02-20 LAB — URINE CULTURE

## 2022-02-20 LAB — HEPATITIS B SURFACE ANTIBODY, QUANTITATIVE: Hep B S AB Quant (Post): 21.3 m[IU]/mL (ref >9.9)

## 2022-02-21 ENCOUNTER — Other Ambulatory Visit (INDEPENDENT_AMBULATORY_CARE_PROVIDER_SITE_OTHER): Payer: Self-pay | Admitting: Vascular Surgery

## 2022-02-21 DIAGNOSIS — Z9889 Other specified postprocedural states: Secondary | ICD-10-CM

## 2022-02-21 DIAGNOSIS — Z992 Dependence on renal dialysis: Secondary | ICD-10-CM | POA: Diagnosis not present

## 2022-02-21 DIAGNOSIS — N186 End stage renal disease: Secondary | ICD-10-CM | POA: Diagnosis not present

## 2022-02-21 DIAGNOSIS — E8779 Other fluid overload: Secondary | ICD-10-CM | POA: Diagnosis not present

## 2022-02-21 LAB — TYPE AND SCREEN
ABO/RH(D): O POS
Antibody Screen: NEGATIVE
Donor AG Type: NEGATIVE
Donor AG Type: NEGATIVE
Donor AG Type: NEGATIVE
Donor AG Type: NEGATIVE
PT AG Type: NEGATIVE
Unit division: 0
Unit division: 0
Unit division: 0
Unit division: 0
Unit division: 0

## 2022-02-21 LAB — BPAM RBC
Blood Product Expiration Date: 202307192359
Blood Product Expiration Date: 202307192359
Blood Product Expiration Date: 202307242359
Blood Product Expiration Date: 202307252359
Blood Product Expiration Date: 202307262359
ISSUE DATE / TIME: 202306271848
ISSUE DATE / TIME: 202306272220
ISSUE DATE / TIME: 202306280926
Unit Type and Rh: 5100
Unit Type and Rh: 5100
Unit Type and Rh: 5100
Unit Type and Rh: 9500
Unit Type and Rh: 9500

## 2022-02-22 DIAGNOSIS — Z992 Dependence on renal dialysis: Secondary | ICD-10-CM | POA: Diagnosis not present

## 2022-02-22 DIAGNOSIS — E8779 Other fluid overload: Secondary | ICD-10-CM | POA: Diagnosis not present

## 2022-02-22 DIAGNOSIS — N186 End stage renal disease: Secondary | ICD-10-CM | POA: Diagnosis not present

## 2022-02-23 DIAGNOSIS — E8779 Other fluid overload: Secondary | ICD-10-CM | POA: Diagnosis not present

## 2022-02-23 DIAGNOSIS — N186 End stage renal disease: Secondary | ICD-10-CM | POA: Diagnosis not present

## 2022-02-23 DIAGNOSIS — Z992 Dependence on renal dialysis: Secondary | ICD-10-CM | POA: Diagnosis not present

## 2022-02-24 DIAGNOSIS — Z992 Dependence on renal dialysis: Secondary | ICD-10-CM | POA: Diagnosis not present

## 2022-02-24 DIAGNOSIS — E8779 Other fluid overload: Secondary | ICD-10-CM | POA: Diagnosis not present

## 2022-02-24 DIAGNOSIS — N186 End stage renal disease: Secondary | ICD-10-CM | POA: Diagnosis not present

## 2022-02-25 DIAGNOSIS — Z992 Dependence on renal dialysis: Secondary | ICD-10-CM | POA: Diagnosis not present

## 2022-02-25 DIAGNOSIS — N186 End stage renal disease: Secondary | ICD-10-CM | POA: Diagnosis not present

## 2022-02-25 DIAGNOSIS — E8779 Other fluid overload: Secondary | ICD-10-CM | POA: Diagnosis not present

## 2022-02-26 ENCOUNTER — Ambulatory Visit (INDEPENDENT_AMBULATORY_CARE_PROVIDER_SITE_OTHER): Payer: BC Managed Care – PPO

## 2022-02-26 ENCOUNTER — Encounter (INDEPENDENT_AMBULATORY_CARE_PROVIDER_SITE_OTHER): Payer: Self-pay | Admitting: Nurse Practitioner

## 2022-02-26 ENCOUNTER — Ambulatory Visit (INDEPENDENT_AMBULATORY_CARE_PROVIDER_SITE_OTHER): Payer: BC Managed Care – PPO | Admitting: Nurse Practitioner

## 2022-02-26 VITALS — BP 129/88 | HR 96 | Resp 16 | Wt 218.4 lb

## 2022-02-26 DIAGNOSIS — Z9889 Other specified postprocedural states: Secondary | ICD-10-CM

## 2022-02-26 DIAGNOSIS — N186 End stage renal disease: Secondary | ICD-10-CM

## 2022-02-26 DIAGNOSIS — E8779 Other fluid overload: Secondary | ICD-10-CM | POA: Diagnosis not present

## 2022-02-26 DIAGNOSIS — Z992 Dependence on renal dialysis: Secondary | ICD-10-CM | POA: Diagnosis not present

## 2022-02-26 DIAGNOSIS — I10 Essential (primary) hypertension: Secondary | ICD-10-CM

## 2022-02-27 DIAGNOSIS — Z992 Dependence on renal dialysis: Secondary | ICD-10-CM | POA: Diagnosis not present

## 2022-02-27 DIAGNOSIS — E8779 Other fluid overload: Secondary | ICD-10-CM | POA: Diagnosis not present

## 2022-02-27 DIAGNOSIS — N186 End stage renal disease: Secondary | ICD-10-CM | POA: Diagnosis not present

## 2022-02-28 DIAGNOSIS — Z992 Dependence on renal dialysis: Secondary | ICD-10-CM | POA: Diagnosis not present

## 2022-02-28 DIAGNOSIS — E8779 Other fluid overload: Secondary | ICD-10-CM | POA: Diagnosis not present

## 2022-02-28 DIAGNOSIS — N186 End stage renal disease: Secondary | ICD-10-CM | POA: Diagnosis not present

## 2022-03-01 DIAGNOSIS — N186 End stage renal disease: Secondary | ICD-10-CM | POA: Diagnosis not present

## 2022-03-01 DIAGNOSIS — E8779 Other fluid overload: Secondary | ICD-10-CM | POA: Diagnosis not present

## 2022-03-01 DIAGNOSIS — Z992 Dependence on renal dialysis: Secondary | ICD-10-CM | POA: Diagnosis not present

## 2022-03-02 DIAGNOSIS — N186 End stage renal disease: Secondary | ICD-10-CM | POA: Diagnosis not present

## 2022-03-02 DIAGNOSIS — Z992 Dependence on renal dialysis: Secondary | ICD-10-CM | POA: Diagnosis not present

## 2022-03-02 DIAGNOSIS — E8779 Other fluid overload: Secondary | ICD-10-CM | POA: Diagnosis not present

## 2022-03-03 DIAGNOSIS — E8779 Other fluid overload: Secondary | ICD-10-CM | POA: Diagnosis not present

## 2022-03-03 DIAGNOSIS — N186 End stage renal disease: Secondary | ICD-10-CM | POA: Diagnosis not present

## 2022-03-03 DIAGNOSIS — Z992 Dependence on renal dialysis: Secondary | ICD-10-CM | POA: Diagnosis not present

## 2022-03-04 DIAGNOSIS — D649 Anemia, unspecified: Secondary | ICD-10-CM | POA: Diagnosis not present

## 2022-03-04 DIAGNOSIS — E8779 Other fluid overload: Secondary | ICD-10-CM | POA: Diagnosis not present

## 2022-03-04 DIAGNOSIS — N921 Excessive and frequent menstruation with irregular cycle: Secondary | ICD-10-CM | POA: Diagnosis not present

## 2022-03-04 DIAGNOSIS — Z992 Dependence on renal dialysis: Secondary | ICD-10-CM | POA: Diagnosis not present

## 2022-03-04 DIAGNOSIS — N186 End stage renal disease: Secondary | ICD-10-CM | POA: Diagnosis not present

## 2022-03-05 DIAGNOSIS — Z992 Dependence on renal dialysis: Secondary | ICD-10-CM | POA: Diagnosis not present

## 2022-03-05 DIAGNOSIS — E8779 Other fluid overload: Secondary | ICD-10-CM | POA: Diagnosis not present

## 2022-03-05 DIAGNOSIS — N186 End stage renal disease: Secondary | ICD-10-CM | POA: Diagnosis not present

## 2022-03-06 ENCOUNTER — Telehealth: Payer: Self-pay | Admitting: Physician Assistant

## 2022-03-06 DIAGNOSIS — Z992 Dependence on renal dialysis: Secondary | ICD-10-CM | POA: Diagnosis not present

## 2022-03-06 DIAGNOSIS — E8779 Other fluid overload: Secondary | ICD-10-CM | POA: Diagnosis not present

## 2022-03-06 DIAGNOSIS — N186 End stage renal disease: Secondary | ICD-10-CM | POA: Diagnosis not present

## 2022-03-06 NOTE — Telephone Encounter (Signed)
Scheduled appt per 7/12 referral. Pt is aware of appt date and time. Pt is aware to arrive 15 mins prior to appt time and to bring and updated insurance card. Pt is aware of appt location.   

## 2022-03-07 DIAGNOSIS — E8779 Other fluid overload: Secondary | ICD-10-CM | POA: Diagnosis not present

## 2022-03-07 DIAGNOSIS — N186 End stage renal disease: Secondary | ICD-10-CM | POA: Diagnosis not present

## 2022-03-07 DIAGNOSIS — Z992 Dependence on renal dialysis: Secondary | ICD-10-CM | POA: Diagnosis not present

## 2022-03-08 DIAGNOSIS — E8779 Other fluid overload: Secondary | ICD-10-CM | POA: Diagnosis not present

## 2022-03-08 DIAGNOSIS — N186 End stage renal disease: Secondary | ICD-10-CM | POA: Diagnosis not present

## 2022-03-08 DIAGNOSIS — Z992 Dependence on renal dialysis: Secondary | ICD-10-CM | POA: Diagnosis not present

## 2022-03-09 DIAGNOSIS — E8779 Other fluid overload: Secondary | ICD-10-CM | POA: Diagnosis not present

## 2022-03-09 DIAGNOSIS — N186 End stage renal disease: Secondary | ICD-10-CM | POA: Diagnosis not present

## 2022-03-09 DIAGNOSIS — Z992 Dependence on renal dialysis: Secondary | ICD-10-CM | POA: Diagnosis not present

## 2022-03-10 ENCOUNTER — Encounter (INDEPENDENT_AMBULATORY_CARE_PROVIDER_SITE_OTHER): Payer: Self-pay | Admitting: Nurse Practitioner

## 2022-03-10 DIAGNOSIS — E8779 Other fluid overload: Secondary | ICD-10-CM | POA: Diagnosis not present

## 2022-03-10 DIAGNOSIS — Z992 Dependence on renal dialysis: Secondary | ICD-10-CM | POA: Diagnosis not present

## 2022-03-10 DIAGNOSIS — N186 End stage renal disease: Secondary | ICD-10-CM | POA: Diagnosis not present

## 2022-03-10 NOTE — Progress Notes (Signed)
Subjective:    Patient ID: Holly [name], female    DOB: Sep 14, 1985, 36 y.o.   MRN: [id number] Chief Complaint  Patient presents with   Follow-up    [facility] 3 week follow up    Holly [name] is a 36 year old female that returns today for follow-up wound evaluation after recent left radiocephalic AV fistula placement.  The patient has done well with no significant issues with dehiscence or signs and symptoms of steal syndrome.  However the patient's fistula is not easily palpable.  Today noninvasive study showed no evidence of significant stenosis.  There is a flow volume of 558.    Review of Systems  Hematological:  Bruises/bleeds easily.  All other systems reviewed and are negative.      Objective:   Physical Exam Vitals reviewed.  HENT:     Head: Normocephalic.  Cardiovascular:     Rate and Rhythm: Normal rate.     Pulses:          Radial pulses are 1+ on the left side.     Arteriovenous access: Left arteriovenous access is present.    Comments: Good thrill and bruit Pulmonary:     Effort: Pulmonary effort is normal.  Skin:    General: Skin is warm and dry.  Neurological:     Mental Status: She is alert and oriented to person, place, and time.  Psychiatric:        Mood and Affect: Mood normal.        Behavior: Behavior normal.        Thought Content: Thought content normal.        Judgment: Judgment normal.     BP 129/88 (BP Location: Right Arm)   Pulse 96   Resp 16   Wt 218 lb 6.4 oz (99.1 kg)   LMP 02/12/2022   BMI 34.21 kg/m   Past Medical History:  Diagnosis Date   Anemia    Anxiety    Arthritis    hands   Blood transfusion without reported diagnosis    Cancer ([place])    Complication of anesthesia    with Fentanyl she is difficult to awake   Depression    Dialysis patient ([place])    Dyspnea    due to anemia   Epilepsy ([place])    Hashimoto's disease    History of kidney stones    HLD (hyperlipidemia)    Hypertension    Hypothyroidism     Pneumonia    Renal disorder    Thyroid disease     Social History   Socioeconomic History   Marital status: Significant Other    Spouse name: [name]   Number of children: Not on file   Years of education: Not on file   Highest education level: Not on file  Occupational History   Not on file  Tobacco Use   Smoking status: Some Days    Types: Cigarettes   Smokeless tobacco: Never  Vaping Use   Vaping Use: Never used  Substance and Sexual Activity   Alcohol use: Never   Drug use: Never   Sexual activity: Not on file  Other Topics Concern   Not on file  Social History Narrative   Not on file   Social Determinants of Health   Financial Resource Strain: Not on file  Food Insecurity: Not on file  Transportation Needs: Not on file  Physical Activity: Not on file  Stress: Not on file  Social Connections: Not on file  Intimate Partner Violence: Not on file    Past Surgical History:  Procedure Laterality Date   ABDOMINAL SURGERY     AV FISTULA PLACEMENT Left 01/17/2022   Procedure: ARTERIOVENOUS (AV) FISTULA CREATION ( RADIAL CEPHALIC);  Surgeon: Katha Cabal, MD;  Location: [facility] ORS;  Service: Vascular;  Laterality: Left;   CERVICAL BIOPSY  W/ LOOP ELECTRODE EXCISION  2018   COLONOSCOPY N/A 01/30/2022   Procedure: COLONOSCOPY;  Surgeon: Annamaria Helling, DO;  Location: [facility] ENDOSCOPY;  Service: Gastroenterology;  Laterality: N/A;   COLONOSCOPY WITH PROPOFOL N/A 02/01/2022   Procedure: COLONOSCOPY WITH PROPOFOL;  Surgeon: Lesly Rubenstein, MD;  Location: [facility] ENDOSCOPY;  Service: Endoscopy;  Laterality: N/A;   DIAGNOSTIC LAPAROSCOPY     due to removal of PD catheters   ESOPHAGOGASTRODUODENOSCOPY N/A 01/30/2022   Procedure: ESOPHAGOGASTRODUODENOSCOPY (EGD);  Surgeon: Annamaria Helling, DO;  Location: [facility] ENDOSCOPY;  Service: Gastroenterology;  Laterality: N/A;   ESOPHAGOGASTRODUODENOSCOPY (EGD) WITH PROPOFOL N/A 02/01/2022   Procedure:  ESOPHAGOGASTRODUODENOSCOPY (EGD) WITH PROPOFOL;  Surgeon: Lesly Rubenstein, MD;  Location: [facility] ENDOSCOPY;  Service: Endoscopy;  Laterality: N/A;   RENAL BIOPSY     x 2   TUMOR REMOVAL     right face    Family History  Problem Relation Age of Onset   Multiple sclerosis Mother    Cancer Father    COPD Father     Allergies  Allergen Reactions   Bupropion Other (See Comments) and Anaphylaxis    Caused seizures Caused seizures Medical Record H&P Medical Record H&P Contributed to seizures Other reaction(s): Other (See Comments) Medical Record H&P Medical Record H&P Medical Record H&P   Ciprofloxacin Other (See Comments) and Anaphylaxis    Caused Seizures  Caused Seizures  Seizures  Seizures  Other reaction(s): Other (See Comments) Seizures  Seizures  Seizures    Levetiracetam Other (See Comments) and Anaphylaxis    Medical Record H&P (unknown reaction) Medical Record H&P (unknown reaction) Medical Record H&P Medical Record H&P Other reaction(s): Other (See Comments) Medical Record H&P Medical Record H&P Medical Record H&P   Rituximab Anaphylaxis and Swelling   Tramadol Other (See Comments) and Anaphylaxis    Caused seizures Other reaction(s): Other (See Comments) Caused seizures Medical Record H&P Medical Record H&P Other reaction(s): Other (See Comments) Medical Record H&P Medical Record H&P Medical Record H&P   Fentanyl     Patient reports that with surgery she is difficult to wake up   Oxycodone-Acetaminophen Nausea Only       Latest Ref Rng & Units 02/19/2022    6:19 PM 02/19/2022    4:08 AM 02/18/2022    5:15 PM  CBC  WBC 4.0 - 10.5 K/uL  9.6  8.8   Hemoglobin 12.0 - 15.0 g/dL 8.0  6.7  5.4   Hematocrit 36.0 - 46.0 % 22.3  19.5  15.2   Platelets 150 - 400 K/uL  191  235       CMP     Component Value Date/Time   NA 133 (L) 02/19/2022 0408   K 4.8 02/19/2022 0408   CL 95 (L) 02/19/2022 0408   CO2 25 02/19/2022 0408   GLUCOSE 79 02/19/2022  0408   BUN 49 (H) 02/19/2022 0408   CREATININE 8.86 (H) 02/19/2022 0408   CALCIUM 9.0 02/19/2022 0408   PROT 6.1 (L) 02/18/2022 1715   ALBUMIN 3.5 02/18/2022 1715   AST 11 (L) 02/18/2022 1715   ALT 12 02/18/2022 1715   ALKPHOS 79 02/18/2022  1715   BILITOT 0.5 02/18/2022 1715   GFRNONAA 6 (L) 02/19/2022 0408   GFRAA 3 (L) 02/19/2020 2302     No results found.     Assessment & Plan:   1. ESRD (end stage renal disease) ([place]) Today the patient's fistula has not fully matured.  Given that it is a radiocephalic AV fistula the phone volume is somewhat acceptable but it is not a largely palpable fistula.  However it has only been approximately 4 weeks.  We will have the patient return in 4 weeks for reevaluation. - VAS [place] (AVF, AVG)  2. Essential hypertension Continue antihypertensive medications as already ordered, these medications have been reviewed and there are no changes at this time.    Current Outpatient Medications on File Prior to Visit  Medication Sig Dispense Refill   acetaminophen (TYLENOL) 500 MG tablet Take 1,000 mg by mouth every 6 (six) hours as needed for headache (pain).     acyclovir (ZOVIRAX) 200 MG capsule Take 200 mg by mouth 2 (two) times daily as needed (out breaks).     amLODipine (NORVASC) 10 MG tablet Take 1 tablet (10 mg total) by mouth daily. 30 tablet 0   carvedilol (COREG) 12.5 MG tablet Take 12.5 mg by mouth 2 (two) times daily with a meal.     cetirizine (ZYRTEC) 10 MG tablet Take 10 mg by mouth at bedtime.     cinacalcet (SENSIPAR) 60 MG tablet Take 60 mg by mouth at bedtime.     diclofenac Sodium (VOLTAREN) 1 % GEL Apply 1 application  topically 4 (four) times daily as needed (hand,feet,thighs and back pain).     gabapentin (NEURONTIN) 100 MG capsule Take 100 mg by mouth 3 (three) times daily.     levothyroxine (SYNTHROID) 200 MCG tablet Take 200 mcg by mouth daily before breakfast.     megestrol (MEGACE) 40 MG tablet Take 2  tablets (80 mg total) by mouth daily. 30 tablet 0   ondansetron (ZOFRAN-ODT) 4 MG disintegrating tablet Take 4 mg by mouth every 8 (eight) hours as needed for nausea or vomiting.     VELPHORO 500 MG chewable tablet Chew 500 mg by mouth 3 (three) times daily with meals.     No current facility-administered medications on file prior to visit.    There are no Patient Instructions on file for this visit. No follow-ups on file.   Kris Hartmann, NP

## 2022-03-11 DIAGNOSIS — E8779 Other fluid overload: Secondary | ICD-10-CM | POA: Diagnosis not present

## 2022-03-11 DIAGNOSIS — Z992 Dependence on renal dialysis: Secondary | ICD-10-CM | POA: Diagnosis not present

## 2022-03-11 DIAGNOSIS — N186 End stage renal disease: Secondary | ICD-10-CM | POA: Diagnosis not present

## 2022-03-12 ENCOUNTER — Ambulatory Visit
Admission: RE | Admit: 2022-03-12 | Discharge: 2022-03-12 | Disposition: A | Discharge status: Home / Self Care | Payer: BC Managed Care – PPO | Source: Ambulatory Visit | Attending: Nephrology | Admitting: Nephrology

## 2022-03-12 VITALS — BP 132/81 | HR 88 | Temp 98.4°F | Resp 17

## 2022-03-12 DIAGNOSIS — N186 End stage renal disease: Secondary | ICD-10-CM | POA: Diagnosis not present

## 2022-03-12 DIAGNOSIS — D649 Anemia, unspecified: Secondary | ICD-10-CM | POA: Diagnosis not present

## 2022-03-12 DIAGNOSIS — E8779 Other fluid overload: Secondary | ICD-10-CM | POA: Diagnosis not present

## 2022-03-12 DIAGNOSIS — Z992 Dependence on renal dialysis: Secondary | ICD-10-CM | POA: Insufficient documentation

## 2022-03-12 LAB — PREPARE RBC (CROSSMATCH)

## 2022-03-12 MED ORDER — SODIUM CHLORIDE 0.9% IV SOLUTION: Freq: Once | INTRAVENOUS | Status: AC

## 2022-03-13 DIAGNOSIS — Z992 Dependence on renal dialysis: Secondary | ICD-10-CM | POA: Diagnosis not present

## 2022-03-13 DIAGNOSIS — E8779 Other fluid overload: Secondary | ICD-10-CM | POA: Diagnosis not present

## 2022-03-13 DIAGNOSIS — N186 End stage renal disease: Secondary | ICD-10-CM | POA: Diagnosis not present

## 2022-03-13 LAB — BPAM RBC
Blood Product Expiration Date: 202308072359
ISSUE DATE / TIME: 202307191436
Unit Type and Rh: 5100

## 2022-03-13 LAB — TYPE AND SCREEN
ABO/RH(D): O POS
Antibody Screen: NEGATIVE
Donor AG Type: NEGATIVE
Unit division: 0

## 2022-03-14 DIAGNOSIS — N186 End stage renal disease: Secondary | ICD-10-CM | POA: Diagnosis not present

## 2022-03-14 DIAGNOSIS — E8779 Other fluid overload: Secondary | ICD-10-CM | POA: Diagnosis not present

## 2022-03-14 DIAGNOSIS — Z992 Dependence on renal dialysis: Secondary | ICD-10-CM | POA: Diagnosis not present

## 2022-03-15 DIAGNOSIS — N186 End stage renal disease: Secondary | ICD-10-CM | POA: Diagnosis not present

## 2022-03-15 DIAGNOSIS — Z992 Dependence on renal dialysis: Secondary | ICD-10-CM | POA: Diagnosis not present

## 2022-03-15 DIAGNOSIS — E8779 Other fluid overload: Secondary | ICD-10-CM | POA: Diagnosis not present

## 2022-03-16 DIAGNOSIS — N186 End stage renal disease: Secondary | ICD-10-CM | POA: Diagnosis not present

## 2022-03-16 DIAGNOSIS — E8779 Other fluid overload: Secondary | ICD-10-CM | POA: Diagnosis not present

## 2022-03-16 DIAGNOSIS — Z992 Dependence on renal dialysis: Secondary | ICD-10-CM | POA: Diagnosis not present

## 2022-03-17 DIAGNOSIS — Z992 Dependence on renal dialysis: Secondary | ICD-10-CM | POA: Diagnosis not present

## 2022-03-17 DIAGNOSIS — N186 End stage renal disease: Secondary | ICD-10-CM | POA: Diagnosis not present

## 2022-03-17 DIAGNOSIS — E8779 Other fluid overload: Secondary | ICD-10-CM | POA: Diagnosis not present

## 2022-03-18 DIAGNOSIS — Z992 Dependence on renal dialysis: Secondary | ICD-10-CM | POA: Diagnosis not present

## 2022-03-18 DIAGNOSIS — N186 End stage renal disease: Secondary | ICD-10-CM | POA: Diagnosis not present

## 2022-03-18 DIAGNOSIS — E8779 Other fluid overload: Secondary | ICD-10-CM | POA: Diagnosis not present

## 2022-03-19 ENCOUNTER — Encounter: Payer: Self-pay | Admitting: Vascular Surgery

## 2022-03-19 DIAGNOSIS — N186 End stage renal disease: Secondary | ICD-10-CM | POA: Diagnosis not present

## 2022-03-19 DIAGNOSIS — E8779 Other fluid overload: Secondary | ICD-10-CM | POA: Diagnosis not present

## 2022-03-19 DIAGNOSIS — Z992 Dependence on renal dialysis: Secondary | ICD-10-CM | POA: Diagnosis not present

## 2022-03-20 DIAGNOSIS — Z992 Dependence on renal dialysis: Secondary | ICD-10-CM | POA: Diagnosis not present

## 2022-03-20 DIAGNOSIS — N186 End stage renal disease: Secondary | ICD-10-CM | POA: Diagnosis not present

## 2022-03-21 DIAGNOSIS — N186 End stage renal disease: Secondary | ICD-10-CM | POA: Diagnosis not present

## 2022-03-21 DIAGNOSIS — Z992 Dependence on renal dialysis: Secondary | ICD-10-CM | POA: Diagnosis not present

## 2022-03-21 DIAGNOSIS — E8779 Other fluid overload: Secondary | ICD-10-CM | POA: Diagnosis not present

## 2022-03-22 DIAGNOSIS — Z992 Dependence on renal dialysis: Secondary | ICD-10-CM | POA: Diagnosis not present

## 2022-03-22 DIAGNOSIS — N186 End stage renal disease: Secondary | ICD-10-CM | POA: Diagnosis not present

## 2022-03-22 DIAGNOSIS — E8779 Other fluid overload: Secondary | ICD-10-CM | POA: Diagnosis not present

## 2022-03-23 DIAGNOSIS — N186 End stage renal disease: Secondary | ICD-10-CM | POA: Diagnosis not present

## 2022-03-23 DIAGNOSIS — E8779 Other fluid overload: Secondary | ICD-10-CM | POA: Diagnosis not present

## 2022-03-23 DIAGNOSIS — Z992 Dependence on renal dialysis: Secondary | ICD-10-CM | POA: Diagnosis not present

## 2022-03-24 DIAGNOSIS — Z992 Dependence on renal dialysis: Secondary | ICD-10-CM | POA: Diagnosis not present

## 2022-03-24 DIAGNOSIS — N186 End stage renal disease: Secondary | ICD-10-CM | POA: Diagnosis not present

## 2022-03-24 DIAGNOSIS — E8779 Other fluid overload: Secondary | ICD-10-CM | POA: Diagnosis not present

## 2022-03-25 DIAGNOSIS — E8779 Other fluid overload: Secondary | ICD-10-CM | POA: Diagnosis not present

## 2022-03-25 DIAGNOSIS — N186 End stage renal disease: Secondary | ICD-10-CM | POA: Diagnosis not present

## 2022-03-25 DIAGNOSIS — Z992 Dependence on renal dialysis: Secondary | ICD-10-CM | POA: Diagnosis not present

## 2022-03-26 ENCOUNTER — Ambulatory Visit (INDEPENDENT_AMBULATORY_CARE_PROVIDER_SITE_OTHER): Payer: BC Managed Care – PPO | Admitting: Nurse Practitioner

## 2022-03-26 ENCOUNTER — Encounter (INDEPENDENT_AMBULATORY_CARE_PROVIDER_SITE_OTHER): Payer: Self-pay | Admitting: Nurse Practitioner

## 2022-03-26 VITALS — BP 112/76 | HR 98 | Resp 18 | Ht 67.0 in | Wt 218.0 lb

## 2022-03-26 DIAGNOSIS — Z992 Dependence on renal dialysis: Secondary | ICD-10-CM | POA: Diagnosis not present

## 2022-03-26 DIAGNOSIS — N186 End stage renal disease: Secondary | ICD-10-CM

## 2022-03-26 DIAGNOSIS — E8779 Other fluid overload: Secondary | ICD-10-CM | POA: Diagnosis not present

## 2022-03-26 DIAGNOSIS — D638 Anemia in other chronic diseases classified elsewhere: Secondary | ICD-10-CM

## 2022-03-27 ENCOUNTER — Other Ambulatory Visit: Payer: Self-pay | Admitting: Physician Assistant

## 2022-03-27 DIAGNOSIS — E8779 Other fluid overload: Secondary | ICD-10-CM | POA: Diagnosis not present

## 2022-03-27 DIAGNOSIS — D649 Anemia, unspecified: Secondary | ICD-10-CM

## 2022-03-27 DIAGNOSIS — Z992 Dependence on renal dialysis: Secondary | ICD-10-CM | POA: Diagnosis not present

## 2022-03-27 DIAGNOSIS — N186 End stage renal disease: Secondary | ICD-10-CM | POA: Diagnosis not present

## 2022-03-27 NOTE — Progress Notes (Signed)
[place] [phone]   [phone]) [phone]  CONSULT NOTE  REFERRING PHYSICIAN: Dr. Candiss Norse  REASON FOR CONSULTATION:  Anemia due to end-stage renal disease and menorrhagia  HPI Holly [name] is a 36 y.o. female with medical history significant for end-stage renal disease on dialysis secondary to glomerulonephritis (dx in 2012, dialysis since 2016), depression/anxiety, epilepsy hypothyroidism, hypertension, and menorrhagia is referred to the clinic for anemia.   The patient was referred by her nephrologist from the [facility].  She has required multiple blood transfusions and is receiving ESA's with Mircera twice a month since November 2022, her last injection was 2 weeks ago.  Despite this, she continues to have significant anemia.  She is scheduled for her next follow-up at her nephrologist office on Wednesday, 04/02/2022.  The patient reports that she previously was on a different ESA injection and had better control of her anemia while on that.  She also mentions that in November 2022 she had a staph infection.  Her more significant anemia has been occurring since November 2022.  The patient was also in the emergency room for symptomatic anemia from menorrhagia on 02/18/2022.  Her hemoglobin upon admission was 5.7.  She received 3 units of blood.  The patient denies any heavy menstrual cycles prior to this episode and states her cycles were "regular".  She still continues to have menstrual bleeding since 02/18/22.  Her GYN prescribed Megace which has "lightened" her cycle although it remains very heavy with associated clotting.  She reports that she is changing a super/super plus tampon every hour.  When she presented to the hospital in June, she reports that she was sitting on  chucks and changing them every 30 minutes due to her heavy cycles.  Because of her end-stage renal disease, the patient mentioned she is not a candidate for several medication  management options of her menorrhagia.  Therefore, her GYN referred her to a specialist at [facility].  Unfortunately, the patient's appointment is not scheduled until 04/25/22.  The patient is hoping to undergo a hysterectomy in the future. She is also hoping her anemia will improve so she can be considered for a kidney transplant.   Overall, the patient is feeling fairly well today considering all that she is experiencing.  She does have some ongoing fatigue.  She states she used to routinely obtain 18,000 steps a day and now she is struggling to even reach 6000 steps a day.  She has some dyspnea on exertion, particularly with stairs, and needs to rest in between activities.  She also had some lightheadedness prior to her hospitalization in June and syncopal episodes leading up to that admission.    The oldest records I have are from 2020.  The patient had mild anemia at that time with a hemoglobin of 11.4.  It appears the more significant anemia started in June 2021 and her hemoglobin was ~9.5 at that time.  She then had progressively worsening and severe anemia since February 2023 and her hemoglobin has typically been ranging between 5-8 majority of the time.  Her most recent CBC is from 02/19/2022 and her hemoglobin was 6.7 at that time.    The patient is currently not taking any iron supplements due to lack of benefit and GI upset.  She reports that she is taking a medication from her nephrologist that does contain iron.  She has tried prescription iron as well as prescription iron without any improvement.  She receives  Venofer infusions 100 mg once a month that her nephrologist office.  She denies have a personal history of fibroids as she recently had a transvaginal ultrasound by her GYN. She denies any other abnormal bleeding including gingival bleeding, hemoptysis, hematemesis, melena, hematochezia, or hematuria.  Denies any blood thinner use except she uses heparin to flush her port for her dialysis. Denies  any chills, night sweats, unexplained weight loss, or lymphadenopathy.  Denies any NSAID use.  .   Regarding her family's medical history, her mother has fibromyalgia, depression, anxiety.  The patient's father had renal cell carcinoma and COPD.  The patient has a brother with Hashimoto's disease, cholelithiasis, and nephrolithiasis.  The patient has a sister who had ovarian cancer at age 18.  The patient works in Insurance claims handler.  She is divorced.  She does not have any children.  Denies any alcohol or drug use.  The patient is a current smoker and is working on quitting.   HPI  Past Medical History:  Diagnosis Date   Anemia    Anxiety    Arthritis    hands   Blood transfusion without reported diagnosis    Cancer ([place])    Complication of anesthesia    with Fentanyl she is difficult to awake   Depression    Dialysis patient ([place])    Dyspnea    due to anemia   Epilepsy ([place])    Hashimoto's disease    History of kidney stones    HLD (hyperlipidemia)    Hypertension    Hypothyroidism    Pneumonia    Renal disorder    Thyroid disease     Past Surgical History:  Procedure Laterality Date   ABDOMINAL SURGERY     AV FISTULA PLACEMENT Left 01/17/2022   Procedure: ARTERIOVENOUS (AV) FISTULA CREATION ( RADIAL CEPHALIC);  Surgeon: Katha Cabal, MD;  Location: [facility] ORS;  Service: Vascular;  Laterality: Left;   CERVICAL BIOPSY  W/ LOOP ELECTRODE EXCISION  2018   COLONOSCOPY N/A 01/30/2022   Procedure: COLONOSCOPY;  Surgeon: Annamaria Helling, DO;  Location: [facility] ENDOSCOPY;  Service: Gastroenterology;  Laterality: N/A;   COLONOSCOPY WITH PROPOFOL N/A 02/01/2022   Procedure: COLONOSCOPY WITH PROPOFOL;  Surgeon: Lesly Rubenstein, MD;  Location: [facility] ENDOSCOPY;  Service: Endoscopy;  Laterality: N/A;   DIAGNOSTIC LAPAROSCOPY     due to removal of PD catheters   ESOPHAGOGASTRODUODENOSCOPY N/A 01/30/2022   Procedure: ESOPHAGOGASTRODUODENOSCOPY (EGD);  Surgeon: Annamaria Helling, DO;  Location: [facility] ENDOSCOPY;  Service: Gastroenterology;  Laterality: N/A;   ESOPHAGOGASTRODUODENOSCOPY (EGD) WITH PROPOFOL N/A 02/01/2022   Procedure: ESOPHAGOGASTRODUODENOSCOPY (EGD) WITH PROPOFOL;  Surgeon: Lesly Rubenstein, MD;  Location: [facility] ENDOSCOPY;  Service: Endoscopy;  Laterality: N/A;   RENAL BIOPSY     x 2   TUMOR REMOVAL     right face    Family History  Problem Relation Age of Onset   Multiple sclerosis Mother    Cancer Father    COPD Father     Social History Social History   Tobacco Use   Smoking status: Former    Types: Cigarettes   Smokeless tobacco: Never  Vaping Use   Vaping Use: Never used  Substance Use Topics   Alcohol use: Never   Drug use: Never    Allergies  Allergen Reactions   Bupropion Other (See Comments) and Anaphylaxis    Caused seizures Caused seizures Medical Record H&P Medical Record H&P Contributed to seizures Other reaction(s): Other (See Comments) Medical  Record H&P Medical Record H&P Medical Record H&P   Ciprofloxacin Other (See Comments) and Anaphylaxis    Caused Seizures  Caused Seizures  Seizures  Seizures  Other reaction(s): Other (See Comments) Seizures  Seizures  Seizures    Levetiracetam Other (See Comments) and Anaphylaxis    Medical Record H&P (unknown reaction) Medical Record H&P (unknown reaction) Medical Record H&P Medical Record H&P Other reaction(s): Other (See Comments) Medical Record H&P Medical Record H&P Medical Record H&P   Rituximab Anaphylaxis and Swelling   Tramadol Other (See Comments) and Anaphylaxis    Caused seizures Other reaction(s): Other (See Comments) Caused seizures Medical Record H&P Medical Record H&P Other reaction(s): Other (See Comments) Medical Record H&P Medical Record H&P Medical Record H&P   Fentanyl     Patient reports that with surgery she is difficult to wake up   Oxycodone-Acetaminophen Nausea Only    Current Outpatient Medications  Medication  Sig Dispense Refill   acetaminophen (TYLENOL) 500 MG tablet Take 1,000 mg by mouth every 6 (six) hours as needed for headache (pain).     acyclovir (ZOVIRAX) 200 MG capsule Take 200 mg by mouth 2 (two) times daily as needed (out breaks).     amLODipine (NORVASC) 10 MG tablet Take 1 tablet (10 mg total) by mouth daily. 30 tablet 0   carvedilol (COREG) 12.5 MG tablet Take 12.5 mg by mouth 2 (two) times daily with a meal.     cetirizine (ZYRTEC) 10 MG tablet Take 10 mg by mouth at bedtime.     cinacalcet (SENSIPAR) 60 MG tablet Take 60 mg by mouth at bedtime.     diclofenac Sodium (VOLTAREN) 1 % GEL Apply 1 application  topically 4 (four) times daily as needed (hand,feet,thighs and back pain).     gabapentin (NEURONTIN) 100 MG capsule Take 100 mg by mouth 3 (three) times daily.     levothyroxine (SYNTHROID) 200 MCG tablet Take 200 mcg by mouth daily before breakfast.     megestrol (MEGACE) 40 MG tablet Take 2 tablets (80 mg total) by mouth daily. 30 tablet 0   ondansetron (ZOFRAN-ODT) 4 MG disintegrating tablet Take 4 mg by mouth every 8 (eight) hours as needed for nausea or vomiting.     VELPHORO 500 MG chewable tablet Chew 500 mg by mouth 3 (three) times daily with meals.     No current facility-administered medications for this visit.    REVIEW OF SYSTEMS:   Review of Systems  Constitutional: Positive for fatigue.  Negative for appetite change, chills, fever and unexpected weight change.  HENT: Negative for mouth sores, nosebleeds, sore throat and trouble swallowing.   Eyes: Negative for eye problems and icterus.  Respiratory: Positive for dyspnea on exertion.  Negative for cough, hemoptysis,  and wheezing.   Cardiovascular: Negative for chest pain and leg swelling.  Gastrointestinal: Negative for abdominal pain, constipation, diarrhea, nausea and vomiting.  Genitourinary: Positive for menorrhagia. negative for bladder incontinence, difficulty urinating, dysuria, frequency and hematuria.    Musculoskeletal: Negative for back pain, gait problem, neck pain and neck stiffness.  Skin: Negative for itching and rash.  Neurological: Negative for dizziness, extremity weakness, gait problem, headaches, and seizures.  Hematological: Negative for adenopathy.  Psychiatric/Behavioral: Negative for confusion, depression and sleep disturbance. The patient is not nervous/anxious.     PHYSICAL EXAMINATION:  There were no vitals taken for this visit.  ECOG PERFORMANCE STATUS: 1  Physical Exam  Constitutional: Oriented to person, place, and time and well-developed, well-nourished, and in  no distress.  HENT:  Head: Normocephalic and atraumatic.  Mouth/Throat: Oropharynx is clear and moist. No oropharyngeal exudate.  Eyes: Conjunctivae are normal. Right eye exhibits no discharge. Left eye exhibits no discharge. No scleral icterus.  Neck: Normal range of motion. Neck supple.  Cardiovascular: Normal rate, regular rhythm, normal heart sounds and intact distal pulses.   Pulmonary/Chest: Effort normal and breath sounds normal. No respiratory distress. No wheezes. No rales.  Abdominal: Soft. Bowel sounds are normal. Exhibits no distension and no mass. There is no tenderness.  Musculoskeletal: Normal range of motion. Exhibits no edema.  Lymphadenopathy:    No cervical adenopathy.  Neurological: Alert and oriented to person, place, and time. Exhibits normal muscle tone. Gait normal. Coordination normal.  Skin: Skin is warm and dry. No rash noted. Not diaphoretic. No erythema. Positive for pallor.  Psychiatric: Mood, memory and judgment normal.  Vitals reviewed.  LABORATORY DATA: Lab Results  Component Value Date   WBC 9.6 02/19/2022   HGB 8.0 (L) 02/19/2022   HCT 22.3 (L) 02/19/2022   MCV 89.4 02/19/2022   PLT 191 02/19/2022      Chemistry      Component Value Date/Time   NA 133 (L) 02/19/2022 0408   K 4.8 02/19/2022 0408   CL 95 (L) 02/19/2022 0408   CO2 25 02/19/2022 0408   BUN 49  (H) 02/19/2022 0408   CREATININE 8.86 (H) 02/19/2022 0408      Component Value Date/Time   CALCIUM 9.0 02/19/2022 0408   ALKPHOS 79 02/18/2022 1715   AST 11 (L) 02/18/2022 1715   ALT 12 02/18/2022 1715   BILITOT 0.5 02/18/2022 1715       RADIOGRAPHIC STUDIES: VAS US DUPLEX DIALYSIS ACCESS (AVF, AVG)  Result Date: 03/03/2022 DIALYSIS ACCESS Patient Name:  RAEANA BLINN [facility]  Date of Exam:   02/26/2022 Medical Rec #: [id number]            Accession #:    [id number] Date of Birth: 1985-11-08            Patient Gender: F Patient Age:   83 years Exam Location:  [facility] Procedure:      VAS US DUPLEX DIALYSIS ACCESS (AVF, AVG) Referring Phys: [name] --------------------------------------------------------------------------------  Reason for Exam: First look AVF. Access Site: Left Upper Extremity. Access Type: Radial-cephalic AVF. History: Created 01/17/2022. Performing Technologist: Concha Norway RVT  Examination Guidelines: A complete evaluation includes B-mode imaging, spectral Doppler, color Doppler, and power Doppler as needed of all accessible portions of each vessel. Unilateral testing is considered an integral part of a complete examination. Limited examinations for reoccurring indications may be performed as noted.  Findings: +--------------------+----------+-----------------+--------+ AVF                 PSV (cm/s)Flow Vol (mL/min)Comments +--------------------+----------+-----------------+--------+ Native artery inflow   132           558                +--------------------+----------+-----------------+--------+ AVF Anastomosis        557                              +--------------------+----------+-----------------+--------+  +---------------+----------+-------------+----------+--------+ OUTFLOW VEIN   PSV (cm/s)Diameter (cm)Depth (cm)Describe +---------------+----------+-------------+----------+--------+ Subclavian vein   274                                     +---------------+----------+-------------+----------+--------+  Confluence        255                                    +---------------+----------+-------------+----------+--------+ Prox UA           124                                    +---------------+----------+-------------+----------+--------+ Mid UA            116                                    +---------------+----------+-------------+----------+--------+ Dist UA           107                                    +---------------+----------+-------------+----------+--------+ Prox Forearm      103                                    +---------------+----------+-------------+----------+--------+ Mid Forearm       114                                    +---------------+----------+-------------+----------+--------+ Dist Forearm      263                                    +---------------+----------+-------------+----------+--------+  +-------------------+-------------+---------+---------+----------+-------------+                    Diameter (cm)  Depth  BranchingPSV (cm/s) Flow Volume                                    (cm)                        (ml/min)    +-------------------+-------------+---------+---------+----------+-------------+ Lt Rad Art Dis to                                     53                  anast                                                                     +-------------------+-------------+---------+---------+----------+-------------+ retrograde                                                                +-------------------+-------------+---------+---------+----------+-------------+  Lt Rad Art pre                                       173                  anast                                                                     +-------------------+-------------+---------+---------+----------+-------------+ antegrade                                                                  +-------------------+-------------+---------+---------+----------+-------------+  Summary: Patent new left RadCeph AVF with ni evidence of significant stenosis.  *See table(s) above for measurements and observations.  Diagnosing physician: [name] MD Electronically signed by [name] MD on 03/03/2022 at 5:20:21 PM.   --------------------------------------------------------------------------------   Final     ASSESSMENT: This is a very pleasant 36 year old Caucasian female referred to the clinic for anemia secondary to end-stage renal disease on dialysis and menorrhagia  The patient had several lab studies performed today including a CBC, CMP, iron studies, ferritin,  T06, folic acid, and vitamin B12.  Her lab studies from today demonstrate significant anemia with a hemoglobin of 5.2.  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  The patient's blood is going to be transported from [place]. The patient CMP continues to demonstrate end-stage renal disease with a creatinine over 9.  The patient's folate and B12 are within normal limits.  The patient's ferritin is still pending.  The patient's iron is within normal limits.  Due to the patient's severe anemia, Dr. Julien Nordmann recommends that the patient be transferred to the emergency department.  Dr. Julien Nordmann does not feel comfortable sending the patient home with the severe anemia.   Unfortunately, Dr. Julien Nordmann discussed the patient's anemia is likely secondary to her menorrhagia and end-stage renal disease.  Treatment options are supportive of our standpoint including transfusion support, EPO injections, and iron infusions which she is already receiving.  The patient is receiving Mircera 150 every 2 weeks injections at her nephrology office.  Dr. Julien Nordmann  recommended trying to switch this back to Retacrit as the patient had improved response with alternative ESA injections in the past.  We will arrange for weekly Retacrit 30,000 units starting next week.  The patient is also receiving a low dose of Venofer at her nephrology office with the 100 mg.  Dr. Julien Nordmann recommends arranging for 500 mg weekly x2 starting next week.  We will continue to monitor the patient's labs closely on a weekly basis and arrange for transfusion support if needed.  In the meantime, hopeful that the patient's menorrhagia can improve under the guidance of GYN.  The patient has been having ongoing menstrual bleeding for 6 weeks.  Although her cycles have "lightened up", she is still  needing to change her tampon every hour.  The patient has an appointment with a specialist at [facility] on 04/25/2022.  The patient is hopeful that she can be considered for a hysterectomy with transfusion support prior to surgery.  For now, she will continue to take Megace as prescribed by her GYN  We will see her back for follow-up visit in 1 month for evaluation and repeat blood work.  She will continue to follow with Dr. Candiss Norse from the dialysis clinic.  The patient voices understanding of current disease status and treatment options and is in agreement with the current care plan.  All questions were answered. The patient knows to call the clinic with any problems, questions or concerns. We can certainly see the patient much sooner if necessary.  Thank you so much for allowing me to participate in the care of Holly [name]. I will continue to follow up the patient with you and assist in her care.   Disclaimer: This note was dictated with voice recognition software. Similar sounding words can inadvertently be transcribed and may not be corrected upon review.   Jo Cerone L Thula Stewart March 27, 2022, 7:52 AM  ADDENDUM: Hematology/Oncology Attending: I had a face-to-face encounter with the  patient today.  I reviewed her records, lab and recommended her care plan.  This is a very unfortunate 36 years old white female with end-stage renal disease and currently on hemodialysis secondary to glomerulonephritis diagnosed in 2012 and has been on dialysis since 2016.  The patient also has a history of depression/anxiety, epilepsy, hypothyroidism, hypertension as well as menorrhagia.  She is followed by nephrology for her dialysis and she received treatment with iron infusion with low-dose Venofer as well as Mircera twice a months since November 2022 but she has no improvement in her condition.  She continues to have menorrhagia and she is expected to see a gynecologist at [facility] for consideration of hysterectomy.  There was a discussion about treatment with hormonal therapy but the concern about clotting especially with her dialysis and the fistula was a concern. I had a lengthy discussion with the patient today about her condition.  Repeat CBC today showed hemoglobin of 5.2 and hematocrit 15.2%.  She has normal total white blood count as well as platelet counts.  Comprehensive metabolic panel showed the renal insufficiency with serum creatinine of 10.12.  Ferritin level was elevated at 494.  Serum iron was 56 with iron saturation of 19%.  She has normal vitamin B12 level as well as normal folate. Will try to arrange for the patient to receive PRBCs transfusion in the clinic today but unfortunately because of the autoantibodies will not be able to provide her with transfusion in the clinic in a timely manner. We recommended for the patient to go immediately to the emergency department for further evaluation and consideration of transfusion in the emergency department or admission for observation and treatment. Will arrange for the patient to receive iron infusion with Venofer 500 Mg IV weekly for 2 weeks in addition to Retacrit 30,000 units weekly. I think her blood count would improve after  resolution of the menorrhagia after her hysterectomy. Will see the patient back for follow-up visit in 1 months for evaluation and repeat blood work. She was advised to call immediately if she has any other concerning symptoms in the interval. The total time spent in the appointment was 60 minutes. Disclaimer: This note was dictated with voice recognition software. Similar sounding words can inadvertently be  transcribed and may be missed upon review. Eilleen Kempf, MD .

## 2022-03-28 DIAGNOSIS — Z992 Dependence on renal dialysis: Secondary | ICD-10-CM | POA: Diagnosis not present

## 2022-03-28 DIAGNOSIS — E8779 Other fluid overload: Secondary | ICD-10-CM | POA: Diagnosis not present

## 2022-03-28 DIAGNOSIS — N186 End stage renal disease: Secondary | ICD-10-CM | POA: Diagnosis not present

## 2022-03-29 DIAGNOSIS — E8779 Other fluid overload: Secondary | ICD-10-CM | POA: Diagnosis not present

## 2022-03-29 DIAGNOSIS — Z992 Dependence on renal dialysis: Secondary | ICD-10-CM | POA: Diagnosis not present

## 2022-03-29 DIAGNOSIS — N186 End stage renal disease: Secondary | ICD-10-CM | POA: Diagnosis not present

## 2022-03-30 DIAGNOSIS — N186 End stage renal disease: Secondary | ICD-10-CM | POA: Diagnosis not present

## 2022-03-30 DIAGNOSIS — E8779 Other fluid overload: Secondary | ICD-10-CM | POA: Diagnosis not present

## 2022-03-30 DIAGNOSIS — Z992 Dependence on renal dialysis: Secondary | ICD-10-CM | POA: Diagnosis not present

## 2022-03-31 ENCOUNTER — Other Ambulatory Visit: Payer: Self-pay

## 2022-03-31 ENCOUNTER — Inpatient Hospital Stay: Payer: Medicare Other | Attending: Physician Assistant | Admitting: Physician Assistant

## 2022-03-31 ENCOUNTER — Inpatient Hospital Stay: Payer: Medicare Other

## 2022-03-31 ENCOUNTER — Emergency Department (HOSPITAL_COMMUNITY)
Admission: EM | Admit: 2022-03-31 | Discharge: 2022-03-31 | Disposition: A | Discharge status: Home / Self Care | Payer: BC Managed Care – PPO | Attending: Emergency Medicine | Admitting: Emergency Medicine

## 2022-03-31 VITALS — BP 143/86 | HR 98 | Temp 97.9°F | Resp 16 | Wt 216.1 lb

## 2022-03-31 DIAGNOSIS — Z79899 Other long term (current) drug therapy: Secondary | ICD-10-CM | POA: Insufficient documentation

## 2022-03-31 DIAGNOSIS — Z992 Dependence on renal dialysis: Secondary | ICD-10-CM | POA: Diagnosis not present

## 2022-03-31 DIAGNOSIS — N920 Excessive and frequent menstruation with regular cycle: Secondary | ICD-10-CM | POA: Insufficient documentation

## 2022-03-31 DIAGNOSIS — D631 Anemia in chronic kidney disease: Secondary | ICD-10-CM | POA: Insufficient documentation

## 2022-03-31 DIAGNOSIS — E8779 Other fluid overload: Secondary | ICD-10-CM | POA: Diagnosis not present

## 2022-03-31 DIAGNOSIS — D649 Anemia, unspecified: Secondary | ICD-10-CM

## 2022-03-31 DIAGNOSIS — I120 Hypertensive chronic kidney disease with stage 5 chronic kidney disease or end stage renal disease: Secondary | ICD-10-CM | POA: Insufficient documentation

## 2022-03-31 DIAGNOSIS — D638 Anemia in other chronic diseases classified elsewhere: Secondary | ICD-10-CM | POA: Diagnosis not present

## 2022-03-31 DIAGNOSIS — N186 End stage renal disease: Secondary | ICD-10-CM | POA: Insufficient documentation

## 2022-03-31 DIAGNOSIS — E039 Hypothyroidism, unspecified: Secondary | ICD-10-CM | POA: Insufficient documentation

## 2022-03-31 LAB — CMP (CANCER CENTER ONLY)
ALT: 13 U/L (ref 0–44)
AST: 9 U/L — ABNORMAL LOW (ref 15–41)
Albumin: 3.9 g/dL (ref 3.5–5.0)
Alkaline Phosphatase: 64 U/L (ref 38–126)
Anion gap: 14 (ref 5–15)
BUN: 49 mg/dL — ABNORMAL HIGH (ref 6–20)
CO2: 26 mmol/L (ref 22–32)
Calcium: 9.4 mg/dL (ref 8.9–10.3)
Chloride: 97 mmol/L — ABNORMAL LOW (ref 98–111)
Creatinine: 9.97 mg/dL (ref 0.44–1.00)
GFR, Estimated: 5 mL/min — ABNORMAL LOW (ref >60)
Glucose, Bld: 115 mg/dL — ABNORMAL HIGH (ref 70–99)
Potassium: 5 mmol/L (ref 3.5–5.1)
Sodium: 137 mmol/L (ref 135–145)
Total Bilirubin: 0.3 mg/dL (ref 0.3–1.2)
Total Protein: 7.2 g/dL (ref 6.5–8.1)

## 2022-03-31 LAB — SAMPLE TO BLOOD BANK

## 2022-03-31 LAB — CBC WITH DIFFERENTIAL (CANCER CENTER ONLY)
Abs Immature Granulocytes: 0.02 10*3/uL (ref 0.00–0.07)
Basophils Absolute: 0 10*3/uL (ref 0.0–0.1)
Basophils Relative: 0 %
Eosinophils Absolute: 0.3 10*3/uL (ref 0.0–0.5)
Eosinophils Relative: 4 %
HCT: 15.2 % — ABNORMAL LOW (ref 36.0–46.0)
Hemoglobin: 5.2 g/dL — CL (ref 12.0–15.0)
Immature Granulocytes: 0 %
Lymphocytes Relative: 13 %
Lymphs Abs: 0.7 10*3/uL (ref 0.7–4.0)
MCH: 31.9 pg (ref 26.0–34.0)
MCHC: 34.2 g/dL (ref 30.0–36.0)
MCV: 93.3 fL (ref 80.0–100.0)
Monocytes Absolute: 0.5 10*3/uL (ref 0.1–1.0)
Monocytes Relative: 8 %
Neutro Abs: 4.3 10*3/uL (ref 1.7–7.7)
Neutrophils Relative %: 75 %
Platelet Count: 172 10*3/uL (ref 150–400)
RBC: 1.63 MIL/uL — ABNORMAL LOW (ref 3.87–5.11)
RDW: 14.6 % (ref 11.5–15.5)
WBC Count: 5.8 10*3/uL (ref 4.0–10.5)
nRBC: 0 % (ref 0.0–0.2)

## 2022-03-31 LAB — COMPREHENSIVE METABOLIC PANEL
ALT: 13 U/L (ref 0–44)
AST: 9 U/L — ABNORMAL LOW (ref 15–41)
Albumin: 3.9 g/dL (ref 3.5–5.0)
Alkaline Phosphatase: 59 U/L (ref 38–126)
Anion gap: 14 (ref 5–15)
BUN: 48 mg/dL — ABNORMAL HIGH (ref 6–20)
CO2: 25 mmol/L (ref 22–32)
Calcium: 9.4 mg/dL (ref 8.9–10.3)
Chloride: 99 mmol/L (ref 98–111)
Creatinine, Ser: 10.12 mg/dL — ABNORMAL HIGH (ref 0.44–1.00)
GFR, Estimated: 5 mL/min — ABNORMAL LOW (ref >60)
Glucose, Bld: 82 mg/dL (ref 70–99)
Potassium: 5.1 mmol/L (ref 3.5–5.1)
Sodium: 138 mmol/L (ref 135–145)
Total Bilirubin: 0.5 mg/dL (ref 0.3–1.2)
Total Protein: 6.8 g/dL (ref 6.5–8.1)

## 2022-03-31 LAB — CBC
HCT: 15.7 % — ABNORMAL LOW (ref 36.0–46.0)
Hemoglobin: 5.2 g/dL — CL (ref 12.0–15.0)
MCH: 31.9 pg (ref 26.0–34.0)
MCHC: 33.1 g/dL (ref 30.0–36.0)
MCV: 96.3 fL (ref 80.0–100.0)
Platelets: 190 10*3/uL (ref 150–400)
RBC: 1.63 MIL/uL — ABNORMAL LOW (ref 3.87–5.11)
RDW: 14.8 % (ref 11.5–15.5)
WBC: 5.8 10*3/uL (ref 4.0–10.5)
nRBC: 0 % (ref 0.0–0.2)

## 2022-03-31 LAB — VITAMIN B12: Vitamin B-12: 477 pg/mL (ref 180–914)

## 2022-03-31 LAB — I-STAT BETA HCG BLOOD, ED (MC, WL, AP ONLY): I-stat hCG, quantitative: 6.9 m[IU]/mL — ABNORMAL HIGH (ref <5)

## 2022-03-31 LAB — FOLATE: Folate: 6.2 ng/mL (ref >5.9)

## 2022-03-31 LAB — IRON AND IRON BINDING CAPACITY (CC-WL,HP ONLY)
Iron: 56 ug/dL (ref 28–170)
Saturation Ratios: 19 % (ref 10.4–31.8)
TIBC: 303 ug/dL (ref 250–450)
UIBC: 247 ug/dL

## 2022-03-31 LAB — PREPARE RBC (CROSSMATCH)

## 2022-03-31 LAB — FERRITIN: Ferritin: 494 ng/mL — ABNORMAL HIGH (ref 11–307)

## 2022-03-31 MED ORDER — SODIUM CHLORIDE 0.9 % IV SOLN: 10.0000 mL/h | Freq: Once | INTRAVENOUS | Status: DC

## 2022-03-31 MED ORDER — ACETAMINOPHEN 325 MG PO TABS
650.0000 mg | ORAL_TABLET | Freq: Once | ORAL | Status: AC
Start: 2022-03-31 — End: 2022-03-31
  Administered 2022-03-31: 650 mg via ORAL
  Filled 2022-03-31: qty 2

## 2022-03-31 NOTE — ED Notes (Signed)
Pt care taken, no complaints at this time. Blood infusing.

## 2022-03-31 NOTE — ED Triage Notes (Addendum)
Patient came in from cancer center due to to hemoglobin level of 5.2 HX of anemia and ESRD currently on home dialysis

## 2022-03-31 NOTE — ED Provider Notes (Signed)
[facility] DEPT Provider Note   CSN: [id number] Arrival date & time: 03/31/22  1520     History  Chief Complaint  Patient presents with   Anemia    Holly [name] is a 36 y.o. female.   Anemia  Patient sent from oncology/hematology for transfusion.  Hemoglobin of 5.2.  Recurrent needs for transfusion.  Has been feeling more short of breath and fatigue.  It appears that they plan to transfuse 2 units.  However does have antibodies and states blood has to sometimes come from [place].  Complicating factors patient is a home dialysis patient.  Mild shortness of breath.    Past Medical History:  Diagnosis Date   Anemia    Anxiety    Arthritis    hands   Blood transfusion without reported diagnosis    Cancer ([place])    Complication of anesthesia    with Fentanyl she is difficult to awake   Depression    Dialysis patient ([place])    Dyspnea    due to anemia   Epilepsy ([place])    Hashimoto's disease    History of kidney stones    HLD (hyperlipidemia)    Hypertension    Hypothyroidism    Pneumonia    Renal disorder    Thyroid disease     Home Medications Prior to Admission medications   Medication Sig Start Date End Date Taking? Authorizing Provider  acetaminophen (TYLENOL) 500 MG tablet Take 1,000 mg by mouth every 6 (six) hours as needed for headache (pain).    [provider]  acyclovir (ZOVIRAX) 200 MG capsule Take 200 mg by mouth 2 (two) times daily as needed (out breaks). 08/14/21   [provider]  amLODipine (NORVASC) 10 MG tablet Take 1 tablet (10 mg total) by mouth daily. 02/02/22   Loletha Grayer, MD  carvedilol (COREG) 12.5 MG tablet Take 12.5 mg by mouth 2 (two) times daily with a meal.    [provider]  cetirizine (ZYRTEC) 10 MG tablet Take 10 mg by mouth at bedtime.    [provider]  cinacalcet (SENSIPAR) 60 MG tablet Take 60 mg by mouth at bedtime.    [provider]   diclofenac Sodium (VOLTAREN) 1 % GEL Apply 1 application  topically 4 (four) times daily as needed (hand,feet,thighs and back pain).    [provider]  gabapentin (NEURONTIN) 100 MG capsule Take 100 mg by mouth 3 (three) times daily. 01/15/22   [provider]  levothyroxine (SYNTHROID) 200 MCG tablet Take 200 mcg by mouth daily before breakfast.    [provider]  megestrol (MEGACE) 40 MG tablet Take 2 tablets (80 mg total) by mouth daily. 02/20/22   Jennye Boroughs, MD  ondansetron (ZOFRAN-ODT) 4 MG disintegrating tablet Take 4 mg by mouth every 8 (eight) hours as needed for nausea or vomiting. 01/08/22   [provider]  VELPHORO 500 MG chewable tablet Chew 500 mg by mouth 3 (three) times daily with meals. 02/12/22   [provider]      Allergies    Bupropion, Ciprofloxacin, Levetiracetam, Rituximab, Tramadol, Fentanyl, and Oxycodone    Review of Systems   Review of Systems  Physical Exam Updated Vital Signs BP 112/67   Pulse 93   Temp 98.2 F (36.8 C)   Resp 20   Ht '5\' 7"'$  (1.702 m)   Wt 98 kg   LMP 03/31/2022 (Exact Date)   SpO2 97%   BMI 33.83 kg/m  Physical Exam Vitals and nursing note reviewed.  Cardiovascular:     Rate and Rhythm: Regular rhythm.  Pulmonary:     Effort: Pulmonary effort is normal.  Musculoskeletal:     Cervical back: Neck supple.  Skin:    General: Skin is warm.     Coloration: Skin is pale.  Neurological:     Mental Status: She is alert and oriented to person, place, and time.     ED Results / Procedures / Treatments   Labs (all labs ordered are listed, but only abnormal results are displayed) Labs Reviewed  COMPREHENSIVE METABOLIC PANEL - Abnormal; Notable for the following components:      Result Value   BUN 48 (*)    Creatinine, Ser 10.12 (*)    AST 9 (*)    GFR, Estimated 5 (*)    All other components within normal limits  CBC - Abnormal; Notable for the following components:   RBC 1.63  (*)    Hemoglobin 5.2 (*)    HCT 15.7 (*)    All other components within normal limits  I-STAT BETA HCG BLOOD, ED ([facility], [facility], [facility] ONLY) - Abnormal; Notable for the following components:   I-stat hCG, quantitative 6.9 (*)    All other components within normal limits    EKG None  Radiology No results found.  Procedures Procedures    Medications Ordered in ED Medications  0.9 %  sodium chloride infusion (has no administration in time range)  acetaminophen (TYLENOL) tablet 650 mg (650 mg Oral Given 03/31/22 2153)    ED Course/ Medical Decision Making/ A&P                           Medical Decision Making Amount and/or Complexity of Data Reviewed Labs: ordered.  Risk OTC drugs. Prescription drug management.   Patient with anemia.  Reviewed note from hematology although it is incomplete.  Sent in for blood transfusion.  Discussed with blood bank and does have antibodies but likely is able to get the blood from here and not have to come from [place].  Also is in end-stage renal disease patient does home dialysis.  Likely require dialysis after the units but feels if she can do it at home.  States that was not transfused at the cancer center because they were not able to have it all done by 5:00 when she would have to leave.  Patient has been transfused.  Discussed with lab findings.  I-STAT hCG just slightly above normal but doubt pregnancy.  Think this is more of a lab error.  Will discharge home.  Does not appear hypoxic or severely volume overloaded at this time         Final Clinical Impression(s) / ED Diagnoses Final diagnoses:  Anemia, unspecified type  End stage renal disease on dialysis [facility])    Rx / DC Orders ED Discharge Orders     None         Davonna Belling, MD 03/31/22 2336

## 2022-04-01 ENCOUNTER — Telehealth: Payer: Self-pay | Admitting: Physician Assistant

## 2022-04-01 DIAGNOSIS — E8779 Other fluid overload: Secondary | ICD-10-CM | POA: Diagnosis not present

## 2022-04-01 DIAGNOSIS — N186 End stage renal disease: Secondary | ICD-10-CM | POA: Diagnosis not present

## 2022-04-01 DIAGNOSIS — Z992 Dependence on renal dialysis: Secondary | ICD-10-CM | POA: Diagnosis not present

## 2022-04-01 LAB — TYPE AND SCREEN
ABO/RH(D): O POS
Antibody Screen: NEGATIVE
Donor AG Type: NEGATIVE
Donor AG Type: NEGATIVE
Unit division: 0
Unit division: 0

## 2022-04-01 LAB — BPAM RBC
Blood Product Expiration Date: 202309072359
Blood Product Expiration Date: 202309082359
ISSUE DATE / TIME: 202308071836
ISSUE DATE / TIME: 202308072058
Unit Type and Rh: 5100
Unit Type and Rh: 5100

## 2022-04-01 NOTE — Telephone Encounter (Signed)
Scheduled per 08/07 los, patient has been called and notified. 

## 2022-04-02 DIAGNOSIS — N186 End stage renal disease: Secondary | ICD-10-CM | POA: Diagnosis not present

## 2022-04-02 DIAGNOSIS — Z992 Dependence on renal dialysis: Secondary | ICD-10-CM | POA: Diagnosis not present

## 2022-04-02 DIAGNOSIS — E8779 Other fluid overload: Secondary | ICD-10-CM | POA: Diagnosis not present

## 2022-04-03 DIAGNOSIS — E8779 Other fluid overload: Secondary | ICD-10-CM | POA: Diagnosis not present

## 2022-04-03 DIAGNOSIS — N186 End stage renal disease: Secondary | ICD-10-CM | POA: Diagnosis not present

## 2022-04-03 DIAGNOSIS — Z992 Dependence on renal dialysis: Secondary | ICD-10-CM | POA: Diagnosis not present

## 2022-04-04 DIAGNOSIS — N186 End stage renal disease: Secondary | ICD-10-CM | POA: Diagnosis not present

## 2022-04-04 DIAGNOSIS — E8779 Other fluid overload: Secondary | ICD-10-CM | POA: Diagnosis not present

## 2022-04-04 DIAGNOSIS — Z992 Dependence on renal dialysis: Secondary | ICD-10-CM | POA: Diagnosis not present

## 2022-04-05 DIAGNOSIS — E8779 Other fluid overload: Secondary | ICD-10-CM | POA: Diagnosis not present

## 2022-04-05 DIAGNOSIS — N186 End stage renal disease: Secondary | ICD-10-CM | POA: Diagnosis not present

## 2022-04-05 DIAGNOSIS — Z992 Dependence on renal dialysis: Secondary | ICD-10-CM | POA: Diagnosis not present

## 2022-04-06 DIAGNOSIS — E8779 Other fluid overload: Secondary | ICD-10-CM | POA: Diagnosis not present

## 2022-04-06 DIAGNOSIS — N186 End stage renal disease: Secondary | ICD-10-CM | POA: Diagnosis not present

## 2022-04-06 DIAGNOSIS — Z992 Dependence on renal dialysis: Secondary | ICD-10-CM | POA: Diagnosis not present

## 2022-04-07 DIAGNOSIS — E8779 Other fluid overload: Secondary | ICD-10-CM | POA: Diagnosis not present

## 2022-04-07 DIAGNOSIS — Z992 Dependence on renal dialysis: Secondary | ICD-10-CM | POA: Diagnosis not present

## 2022-04-07 DIAGNOSIS — N186 End stage renal disease: Secondary | ICD-10-CM | POA: Diagnosis not present

## 2022-04-08 DIAGNOSIS — N186 End stage renal disease: Secondary | ICD-10-CM | POA: Diagnosis not present

## 2022-04-08 DIAGNOSIS — Z992 Dependence on renal dialysis: Secondary | ICD-10-CM | POA: Diagnosis not present

## 2022-04-09 DIAGNOSIS — Z992 Dependence on renal dialysis: Secondary | ICD-10-CM | POA: Diagnosis not present

## 2022-04-09 DIAGNOSIS — N186 End stage renal disease: Secondary | ICD-10-CM | POA: Diagnosis not present

## 2022-04-09 DIAGNOSIS — E8779 Other fluid overload: Secondary | ICD-10-CM | POA: Diagnosis not present

## 2022-04-10 DIAGNOSIS — N186 End stage renal disease: Secondary | ICD-10-CM | POA: Diagnosis not present

## 2022-04-10 DIAGNOSIS — Z992 Dependence on renal dialysis: Secondary | ICD-10-CM | POA: Diagnosis not present

## 2022-04-11 ENCOUNTER — Emergency Department (HOSPITAL_COMMUNITY): Payer: Medicare Other

## 2022-04-11 ENCOUNTER — Encounter (HOSPITAL_COMMUNITY): Payer: Self-pay

## 2022-04-11 ENCOUNTER — Emergency Department (HOSPITAL_COMMUNITY)
Admission: EM | Admit: 2022-04-11 | Discharge: 2022-04-11 | Disposition: A | Discharge status: Home / Self Care | Payer: Medicare Other | Attending: Emergency Medicine | Admitting: Emergency Medicine

## 2022-04-11 ENCOUNTER — Other Ambulatory Visit: Payer: Self-pay

## 2022-04-11 ENCOUNTER — Inpatient Hospital Stay: Payer: Medicare Other

## 2022-04-11 DIAGNOSIS — E8779 Other fluid overload: Secondary | ICD-10-CM | POA: Diagnosis not present

## 2022-04-11 DIAGNOSIS — R062 Wheezing: Secondary | ICD-10-CM | POA: Diagnosis not present

## 2022-04-11 DIAGNOSIS — Z20822 Contact with and (suspected) exposure to covid-19: Secondary | ICD-10-CM | POA: Insufficient documentation

## 2022-04-11 DIAGNOSIS — R0603 Acute respiratory distress: Secondary | ICD-10-CM | POA: Insufficient documentation

## 2022-04-11 DIAGNOSIS — R059 Cough, unspecified: Secondary | ICD-10-CM | POA: Diagnosis not present

## 2022-04-11 DIAGNOSIS — R0602 Shortness of breath: Secondary | ICD-10-CM | POA: Diagnosis not present

## 2022-04-11 DIAGNOSIS — R000 Tachycardia, unspecified: Secondary | ICD-10-CM | POA: Diagnosis not present

## 2022-04-11 DIAGNOSIS — R0902 Hypoxemia: Secondary | ICD-10-CM | POA: Insufficient documentation

## 2022-04-11 DIAGNOSIS — N186 End stage renal disease: Secondary | ICD-10-CM | POA: Insufficient documentation

## 2022-04-11 DIAGNOSIS — J45909 Unspecified asthma, uncomplicated: Secondary | ICD-10-CM | POA: Insufficient documentation

## 2022-04-11 DIAGNOSIS — Z992 Dependence on renal dialysis: Secondary | ICD-10-CM | POA: Insufficient documentation

## 2022-04-11 LAB — COMPREHENSIVE METABOLIC PANEL
ALT: 13 U/L (ref 0–44)
AST: 12 U/L — ABNORMAL LOW (ref 15–41)
Albumin: 4.1 g/dL (ref 3.5–5.0)
Alkaline Phosphatase: 71 U/L (ref 38–126)
Anion gap: 11 (ref 5–15)
BUN: 31 mg/dL — ABNORMAL HIGH (ref 6–20)
CO2: 26 mmol/L (ref 22–32)
Calcium: 10 mg/dL (ref 8.9–10.3)
Chloride: 100 mmol/L (ref 98–111)
Creatinine, Ser: 8.76 mg/dL — ABNORMAL HIGH (ref 0.44–1.00)
GFR, Estimated: 6 mL/min — ABNORMAL LOW (ref >60)
Glucose, Bld: 96 mg/dL (ref 70–99)
Potassium: 5.1 mmol/L (ref 3.5–5.1)
Sodium: 137 mmol/L (ref 135–145)
Total Bilirubin: 0.6 mg/dL (ref 0.3–1.2)
Total Protein: 6.7 g/dL (ref 6.5–8.1)

## 2022-04-11 LAB — CBC WITH DIFFERENTIAL/PLATELET
Abs Immature Granulocytes: 0.03 10*3/uL (ref 0.00–0.07)
Basophils Absolute: 0.1 10*3/uL (ref 0.0–0.1)
Basophils Relative: 1 %
Eosinophils Absolute: 0.4 10*3/uL (ref 0.0–0.5)
Eosinophils Relative: 5 %
HCT: 24 % — ABNORMAL LOW (ref 36.0–46.0)
Hemoglobin: 8.1 g/dL — ABNORMAL LOW (ref 12.0–15.0)
Immature Granulocytes: 0 %
Lymphocytes Relative: 12 %
Lymphs Abs: 1 10*3/uL (ref 0.7–4.0)
MCH: 31.5 pg (ref 26.0–34.0)
MCHC: 33.8 g/dL (ref 30.0–36.0)
MCV: 93.4 fL (ref 80.0–100.0)
Monocytes Absolute: 0.6 10*3/uL (ref 0.1–1.0)
Monocytes Relative: 7 %
Neutro Abs: 6.2 10*3/uL (ref 1.7–7.7)
Neutrophils Relative %: 75 %
Platelets: 190 10*3/uL (ref 150–400)
RBC: 2.57 MIL/uL — ABNORMAL LOW (ref 3.87–5.11)
RDW: 13.3 % (ref 11.5–15.5)
WBC: 8.3 10*3/uL (ref 4.0–10.5)
nRBC: 0 % (ref 0.0–0.2)

## 2022-04-11 LAB — RESP PANEL BY RT-PCR (FLU A&B, COVID) ARPGX2
Influenza A by PCR: NEGATIVE
Influenza B by PCR: NEGATIVE
SARS Coronavirus 2 by RT PCR: NEGATIVE

## 2022-04-11 LAB — I-STAT BETA HCG BLOOD, ED (MC, WL, AP ONLY): I-stat hCG, quantitative: 6.7 m[IU]/mL — ABNORMAL HIGH (ref <5)

## 2022-04-11 LAB — TROPONIN I (HIGH SENSITIVITY)
Troponin I (High Sensitivity): 7 ng/L (ref <18)
Troponin I (High Sensitivity): 7 ng/L (ref <18)

## 2022-04-11 LAB — BRAIN NATRIURETIC PEPTIDE: B Natriuretic Peptide: 106.7 pg/mL — ABNORMAL HIGH (ref 0.0–100.0)

## 2022-04-11 MED ORDER — ALBUTEROL SULFATE (2.5 MG/3ML) 0.083% IN NEBU
5.0000 mg | INHALATION_SOLUTION | Freq: Once | RESPIRATORY_TRACT | Status: AC
  Administered 2022-04-11: 5 mg via RESPIRATORY_TRACT
  Filled 2022-04-11: qty 6

## 2022-04-11 MED ORDER — IOHEXOL 350 MG/ML SOLN
80.0000 mL | Freq: Once | INTRAVENOUS | Status: AC | PRN
  Administered 2022-04-11: 80 mL via INTRAVENOUS

## 2022-04-11 MED ORDER — ALBUTEROL SULFATE HFA 108 (90 BASE) MCG/ACT IN AERS: 2.0000 | INHALATION_SPRAY | RESPIRATORY_TRACT | 0 refills | Status: DC | PRN

## 2022-04-11 NOTE — Discharge Instructions (Addendum)
You were treated with albuterol for your shortness of breath and had improvement. I am going to send you with a prescription for an albuterol inhaler to use as needed. I recommend you follow-up with either a primary care doctor or a pulmonologist. I do recommend that you at least call the pulmonologist listed and see if you are able to get in with them.   If you start to feel similar to the way you did today please make sure to come in immediately.

## 2022-04-11 NOTE — ED Triage Notes (Signed)
Pt BIB [facility] from home. Per EMS, pt started experiencing SOB since Wednesday w non-productive cough. Hx anemia. Multiple blood transfusions within the past few weeks. Is scheduled to get one transfusion today. Pt does hemodialysis at home, did 45 mins today at home, was not able to complete the whole treatment bc SOB. A&^O X4. Tachy w EMS. Pt does not wear O2 at home, EMS had pt on 4L en route.

## 2022-04-11 NOTE — ED Notes (Signed)
This RN attempted twice getting IVS for this pt. Unsuccessful.

## 2022-04-11 NOTE — ED Provider Notes (Signed)
[facility] EMERGENCY DEPARTMENT Provider Note   CSN: [id number] Arrival date & time: 04/11/22  1129     History  Chief Complaint  Patient presents with   Shortness of Breath    Holly [name] is a 36 y.o. female with history of ESRD on home HD (d/t [employer]PG[employer]), Anemia with menorrhagia presenting with shortness of breath. Patient notes that the shortness of breath started on Wednesday and has progressively gotten worse. She denies any significant sputum production but does note that she has difficulty with breathing worse with laying flat. Patient reports history of childhood asthma, does not have any home inhalers. She also notes 20 pack year history of smoking but no diagnosis of COPD.  She does have significant anemia with recent menorrhagia (6 week heavy period that just ended about 1 week ago), she has required multiple transfusions with the last one being 2 weeks ago and she is reportedly due for another one today. Her hgb has run in the 5s before getting her transfusions but have never had the symptoms she is now having.    Shortness of Breath Associated symptoms: cough (dry)   Associated symptoms: no abdominal pain, no chest pain, no diaphoresis and no vomiting        Home Medications Prior to Admission medications   Medication Sig Start Date End Date Taking? Authorizing Provider  albuterol (VENTOLIN HFA) 108 (90 Base) MCG/ACT inhaler Inhale 2-4 puffs into the lungs every 4 (four) hours as needed for wheezing (or cough). 04/11/22  Yes Remmi Armenteros, DO  acetaminophen (TYLENOL) 500 MG tablet Take 1,000 mg by mouth every 6 (six) hours as needed for headache (pain).    [provider]  acyclovir (ZOVIRAX) 200 MG capsule Take 200 mg by mouth 2 (two) times daily as needed (out breaks). 08/14/21   [provider]  amLODipine (NORVASC) 10 MG tablet Take 1 tablet (10 mg total) by mouth daily. 02/02/22   Loletha Grayer, MD  carvedilol (COREG) 12.5  MG tablet Take 12.5 mg by mouth 2 (two) times daily with a meal.    [provider]  cetirizine (ZYRTEC) 10 MG tablet Take 10 mg by mouth at bedtime.    [provider]  cinacalcet (SENSIPAR) 60 MG tablet Take 60 mg by mouth at bedtime.    [provider]  diclofenac Sodium (VOLTAREN) 1 % GEL Apply 1 application  topically 4 (four) times daily as needed (hand,feet,thighs and back pain).    [provider]  gabapentin (NEURONTIN) 100 MG capsule Take 100 mg by mouth 3 (three) times daily. 01/15/22   [provider]  levothyroxine (SYNTHROID) 200 MCG tablet Take 200 mcg by mouth daily before breakfast.    [provider]  megestrol (MEGACE) 40 MG tablet Take 2 tablets (80 mg total) by mouth daily. 02/20/22   Jennye Boroughs, MD  ondansetron (ZOFRAN-ODT) 4 MG disintegrating tablet Take 4 mg by mouth every 8 (eight) hours as needed for nausea or vomiting. 01/08/22   [provider]  VELPHORO 500 MG chewable tablet Chew 500 mg by mouth 3 (three) times daily with meals. 02/12/22   [provider]      Allergies    Bupropion, Ciprofloxacin, Levetiracetam, Rituximab, Tramadol, Fentanyl, and Oxycodone    Review of Systems   Review of Systems  Constitutional:  Positive for activity change and fatigue. Negative for diaphoresis.  HENT:  Positive for sinus pressure. Negative for congestion and trouble swallowing.   Eyes:  Negative for visual disturbance.  Respiratory:  Positive for cough (dry), chest tightness and shortness of breath.   Cardiovascular:  Negative for chest pain, palpitations and leg swelling.  Gastrointestinal:  Negative for abdominal pain, constipation, diarrhea, nausea and vomiting.  Genitourinary:  Negative for difficulty urinating and hematuria.  Musculoskeletal:  Negative for arthralgias.  Neurological:  Positive for weakness.    Physical Exam Updated Vital Signs BP (!) 143/81 (BP Location: Right Arm)   Pulse (!)  103   Temp 98 F (36.7 C) (Oral)   Resp 18   LMP 03/31/2022 (Exact Date) Comment: pt bleeding last 6 weeks with recent multiple transfusions  SpO2 96%  Physical Exam Constitutional:      Appearance: She is obese.     Comments: Appears tired but not in acute distress  HENT:     Head: Normocephalic and atraumatic.     Mouth/Throat:     Mouth: Mucous membranes are moist.  Eyes:     Extraocular Movements: Extraocular movements intact.     Pupils: Pupils are equal, round, and reactive to light.  Cardiovascular:     Rate and Rhythm: Regular rhythm. Tachycardia present.  Pulmonary:     Effort: Respiratory distress (mild) present. No tachypnea or accessory muscle usage.     Breath sounds: Wheezing (diffusely) present. No rhonchi or rales.  Chest:     Chest wall: No tenderness or edema.  Abdominal:     General: Bowel sounds are normal.     Palpations: Abdomen is soft.     Tenderness: There is no abdominal tenderness. There is no guarding.  Musculoskeletal:        General: Normal range of motion.     Cervical back: Normal range of motion and neck supple.  Skin:    General: Skin is warm and dry.     Capillary Refill: Capillary refill takes less than 2 seconds.  Neurological:     General: No focal deficit present.     Mental Status: She is alert.     ED Results / Procedures / Treatments   Labs (all labs ordered are listed, but only abnormal results are displayed) Labs Reviewed  COMPREHENSIVE METABOLIC PANEL - Abnormal; Notable for the following components:      Result Value   BUN 31 (*)    Creatinine, Ser 8.76 (*)    AST 12 (*)    GFR, Estimated 6 (*)    All other components within normal limits  CBC WITH DIFFERENTIAL/PLATELET - Abnormal; Notable for the following components:   RBC 2.57 (*)    Hemoglobin 8.1 (*)    HCT 24.0 (*)    All other components within normal limits  BRAIN NATRIURETIC PEPTIDE - Abnormal; Notable for the following components:   B Natriuretic Peptide  106.7 (*)    All other components within normal limits  I-STAT BETA HCG BLOOD, ED ([facility], [facility], [facility] ONLY) - Abnormal; Notable for the following components:   I-stat hCG, quantitative 6.7 (*)    All other components within normal limits  RESP PANEL BY RT-PCR (FLU A&B, COVID) ARPGX2  TROPONIN I (HIGH SENSITIVITY)  TROPONIN I (HIGH SENSITIVITY)    EKG None  Radiology CT Angio Chest PE W/Cm &/Or Wo Cm  Result Date: 04/11/2022 CLINICAL DATA:  Shortness of breath, cough EXAM: CT ANGIOGRAPHY CHEST WITH CONTRAST TECHNIQUE: Multidetector CT imaging of the chest was performed using the standard protocol during bolus administration of intravenous contrast. Multiplanar CT image reconstructions and MIPs were obtained  to evaluate the vascular anatomy. RADIATION DOSE REDUCTION: This exam was performed according to the departmental dose-optimization program which includes automated exposure control, adjustment of the mA and/or kV according to patient size and/or use of iterative reconstruction technique. CONTRAST:  44m OMNIPAQUE IOHEXOL 350 MG/ML SOLN COMPARISON:  11/08/2021 FINDINGS: Cardiovascular: There is homogeneous enhancement in thoracic aorta. There are no intraluminal filling defects in central pulmonary artery branches. Evaluation of small peripheral branches is limited by motion artifacts. Minimal pericardial effusion is present. Mediastinum/Nodes: No significant lymphadenopathy seen. Lungs/Pleura: There is no focal pulmonary consolidation. There is no pleural effusion or pneumothorax. Upper Abdomen: There is 1.7 cm cyst in the left lobe of liver. Musculoskeletal: Unremarkable. Review of the MIP images confirms the above findings. IMPRESSION: There is no evidence of central pulmonary artery embolism. There is no evidence of thoracic aortic dissection. There is no focal pulmonary consolidation. Minimal pericardial effusion.  Hepatic cyst. Electronically Signed   By: PElmer PickerM.D.   On: 04/11/2022  14:47   DG Chest 1 View  Result Date: 04/11/2022 CLINICAL DATA:  Cough and shortness of breath for the past 2 days. EXAM: CHEST  1 VIEW COMPARISON:  02/18/2022 FINDINGS: Normal sized heart. Clear lungs with normal vascularity. Stable right jugular catheter with its tip at the superior cavoatrial junction. Mild scoliosis. IMPRESSION: No active disease. Electronically Signed   By: SClaudie ReveringM.D.   On: 04/11/2022 12:04    Procedures Procedures   Medications Ordered in ED Medications  albuterol (PROVENTIL) (2.5 MG/3ML) 0.083% nebulizer solution 5 mg (has no administration in time range)  albuterol (PROVENTIL) (2.5 MG/3ML) 0.083% nebulizer solution 5 mg (5 mg Nebulization Given 04/11/22 1241)  iohexol (OMNIPAQUE) 350 MG/ML injection 80 mL (80 mLs Intravenous Contrast Given 04/11/22 1428)    ED Course/ Medical Decision Making/ A&P                           Medical Decision Making Amount and/or Complexity of Data Reviewed Labs: ordered. Radiology: ordered.  Risk Prescription drug management.    MKissa Campoyis a 36y.o. female with history of ESRD on home HD (d/t MPGN), Anemia with menorrhagia presenting with shortness of breath. Patient with acute and worsening shortness of breath with patient's medical conditions has broad differential of anemia, COPD/asthma, PE, MI (though less likely), volume overload.   Labs ordered including CMP, CBC, BNP, Troponin, respiratory panel. EKG largely reassuringly. Given acute onset in a young patient with tachycardia and new oxygen requirement, will obtain CTA PE study.   CTA PE study negative, labs with hgb 8.1, mildly elevated BNP and creatinine that are consistent with ESRD on HD. Unclear etiology of patient's acute shortness of breath, but did have improvement after albuterol administration. Patient was still requiring 4L O2 via [place] so trial of weaning to room air done.   3:25 PM Patient was tolerating room air well, but wheezing again upon  examination. Another albuterol nebulizer was ordered to be given prior to discharge. Patient discharged with albuterol inhaler prescription and recommendation to follow-up with pulmonology for presumed asthma exacerbation but will need further outpatient evaluation. Strict return precautions discussed    Final Clinical Impression(s) / ED Diagnoses Final diagnoses:  Hypoxia  SOB (shortness of breath)  Wheezing    Rx / DC Orders ED Discharge Orders          Ordered    albuterol (VENTOLIN HFA) 108 (90 Base) MCG/ACT inhaler  Every 4 hours PRN        04/11/22 1523              Rise Patience, DO 04/11/22 1525    Carmin Muskrat, MD 04/11/22 1550

## 2022-04-12 DIAGNOSIS — N186 End stage renal disease: Secondary | ICD-10-CM | POA: Diagnosis not present

## 2022-04-12 DIAGNOSIS — Z992 Dependence on renal dialysis: Secondary | ICD-10-CM | POA: Diagnosis not present

## 2022-04-13 ENCOUNTER — Encounter (INDEPENDENT_AMBULATORY_CARE_PROVIDER_SITE_OTHER): Payer: Self-pay | Admitting: Nurse Practitioner

## 2022-04-13 DIAGNOSIS — E8779 Other fluid overload: Secondary | ICD-10-CM | POA: Diagnosis not present

## 2022-04-13 DIAGNOSIS — Z992 Dependence on renal dialysis: Secondary | ICD-10-CM | POA: Diagnosis not present

## 2022-04-13 DIAGNOSIS — N186 End stage renal disease: Secondary | ICD-10-CM | POA: Diagnosis not present

## 2022-04-13 NOTE — Progress Notes (Signed)
Subjective:    Patient ID: Holly [name], female    DOB: [birth year], 36 y.o.   MRN: [id number] Chief Complaint  Patient presents with   Follow-up    4 week no studies    The patient returns today for evaluation of her dialysis access.  The patient initially had follow-up which showed adequate flow volume however the fistula is not easily palpable.  Following several more weeks the fistula is much more palpable with a stronger thrill and bruit.  The patient still continues to have issues with anemia.    Review of Systems  All other systems reviewed and are negative.      Objective:   Physical Exam Vitals reviewed.  HENT:     Head: Normocephalic.  Cardiovascular:     Rate and Rhythm: Normal rate.     Pulses: Normal pulses.     Arteriovenous access: Left arteriovenous access is present.    Comments: Good thrill and bruit Pulmonary:     Effort: Pulmonary effort is normal.  Skin:    General: Skin is warm and dry.  Neurological:     Mental Status: She is alert and oriented to person, place, and time.  Psychiatric:        Mood and Affect: Mood normal.        Behavior: Behavior normal.        Thought Content: Thought content normal.        Judgment: Judgment normal.     BP 112/76 (BP Location: Right Arm)   Pulse 98   Resp 18   Ht '5\' 7"'$  (1.702 m)   Wt 218 lb (98.9 kg)   BMI 34.14 kg/m   Past Medical History:  Diagnosis Date   Anemia    Anxiety    Arthritis    hands   Blood transfusion without reported diagnosis    Cancer (HCC)    Complication of anesthesia    with Fentanyl she is difficult to awake   Depression    Dialysis patient ([facility])    Dyspnea    due to anemia   Epilepsy ([place])    Hashimoto's disease    History of kidney stones    HLD (hyperlipidemia)    Hypertension    Hypothyroidism    Pneumonia    Renal disorder    Thyroid disease     Social History   Socioeconomic History   Marital status: Significant Other    Spouse name: [name]    Number of children: Not on file   Years of education: Not on file   Highest education level: Not on file  Occupational History   Not on file  Tobacco Use   Smoking status: Former    Types: Cigarettes   Smokeless tobacco: Never  Vaping Use   Vaping Use: Never used  Substance and Sexual Activity   Alcohol use: Never   Drug use: Never   Sexual activity: Not on file  Other Topics Concern   Not on file  Social History Narrative   Not on file   Social Determinants of Health   Financial Resource Strain: Not on file  Food Insecurity: Not on file  Transportation Needs: Not on file  Physical Activity: Not on file  Stress: Not on file  Social Connections: Not on file  Intimate Partner Violence: Not on file    Past Surgical History:  Procedure Laterality Date   ABDOMINAL SURGERY     AV FISTULA PLACEMENT Left 01/17/2022   Procedure:  ARTERIOVENOUS (AV) FISTULA CREATION ( RADIAL CEPHALIC);  Surgeon: Katha Cabal, MD;  Location: [facility] ORS;  Service: Vascular;  Laterality: Left;   CERVICAL BIOPSY  W/ LOOP ELECTRODE EXCISION  2018   COLONOSCOPY N/A 01/30/2022   Procedure: COLONOSCOPY;  Surgeon: Annamaria Helling, DO;  Location: [facility] ENDOSCOPY;  Service: Gastroenterology;  Laterality: N/A;   COLONOSCOPY WITH PROPOFOL N/A 02/01/2022   Procedure: COLONOSCOPY WITH PROPOFOL;  Surgeon: Lesly Rubenstein, MD;  Location: [facility] ENDOSCOPY;  Service: Endoscopy;  Laterality: N/A;   DIAGNOSTIC LAPAROSCOPY     due to removal of PD catheters   ESOPHAGOGASTRODUODENOSCOPY N/A 01/30/2022   Procedure: ESOPHAGOGASTRODUODENOSCOPY (EGD);  Surgeon: Annamaria Helling, DO;  Location[facility] ENDOSCOPY;  Service: Gastroenterology;  Laterality: N/A;   ESOPHAGOGASTRODUODENOSCOPY (EGD) WITH PROPOFOL N/A 02/01/2022   Procedure: ESOPHAGOGASTRODUODENOSCOPY (EGD) WITH PROPOFOL;  Surgeon: Lesly Rubenstein, MD;  Location: [facility] ENDOSCOPY;  Service: Endoscopy;  Laterality: N/A;   RENAL BIOPSY     x 2   TUMOR  REMOVAL     right face    Family History  Problem Relation Age of Onset   Multiple sclerosis Mother    Cancer Father    COPD Father     Allergies  Allergen Reactions   Bupropion Other (See Comments) and Anaphylaxis    Caused seizures Caused seizures Medical Record H&P Medical Record H&P Contributed to seizures Other reaction(s): Other (See Comments) Medical Record H&P Medical Record H&P Medical Record H&P   Ciprofloxacin Other (See Comments) and Anaphylaxis    Caused Seizures  Caused Seizures  Seizures  Seizures  Other reaction(s): Other (See Comments) Seizures  Seizures  Seizures    Levetiracetam Other (See Comments) and Anaphylaxis    Medical Record H&P (unknown reaction) Medical Record H&P (unknown reaction) Medical Record H&P Medical Record H&P Other reaction(s): Other (See Comments) Medical Record H&P Medical Record H&P Medical Record H&P   Rituximab Anaphylaxis and Swelling   Tramadol Other (See Comments) and Anaphylaxis    Caused seizures Other reaction(s): Other (See Comments) Caused seizures Medical Record H&P Medical Record H&P Other reaction(s): Other (See Comments) Medical Record H&P Medical Record H&P Medical Record H&P   Fentanyl     Patient reports that with surgery she is difficult to wake up   Oxycodone Nausea And Vomiting       Latest Ref Rng & Units 04/11/2022   12:20 PM 03/31/2022    3:29 PM 03/31/2022    1:10 PM  CBC  WBC 4.0 - 10.5 K/uL 8.3  5.8  5.8   Hemoglobin 12.0 - 15.0 g/dL 8.1  5.2  5.2   Hematocrit 36.0 - 46.0 % 24.0  15.7  15.2   Platelets 150 - 400 K/uL 190  190  172       CMP     Component Value Date/Time   NA 137 04/11/2022 1220   K 5.1 04/11/2022 1220   CL 100 04/11/2022 1220   CO2 26 04/11/2022 1220   GLUCOSE 96 04/11/2022 1220   BUN 31 (H) 04/11/2022 1220   CREATININE 8.76 (H) 04/11/2022 1220   CREATININE 9.97 (HH) 03/31/2022 1310   CALCIUM 10.0 04/11/2022 1220   PROT 6.7 04/11/2022 1220   ALBUMIN 4.1  04/11/2022 1220   AST 12 (L) 04/11/2022 1220   AST 9 (L) 03/31/2022 1310   ALT 13 04/11/2022 1220   ALT 13 03/31/2022 1310   ALKPHOS 71 04/11/2022 1220   BILITOT 0.6 04/11/2022 1220   BILITOT 0.3 03/31/2022 1310  GFRNONAA 6 (L) 04/11/2022 1220   GFRNONAA 5 (L) 03/31/2022 1310   GFRAA 3 (L) 02/19/2020 2302     No results found.     Assessment & Plan:   1. ESRD (end stage renal disease) ([place]) Today the patient has a good thrill and bruit and her fistula is more readily palpable.  We will have the patient begin using her access.  We will plan on having her return in 3 months with a HDA or sooner if issues arise.  2. Anemia of chronic disease The patient's recurrent anemia are concerning for the patient due to the concern of associated hypotension.  Continued hypotension may threaten her access.  She will continue to have it managed by nephrology.   Current Outpatient Medications on File Prior to Visit  Medication Sig Dispense Refill   acetaminophen (TYLENOL) 500 MG tablet Take 1,000 mg by mouth every 6 (six) hours as needed for headache (pain).     acyclovir (ZOVIRAX) 200 MG capsule Take 200 mg by mouth 2 (two) times daily as needed (out breaks).     amLODipine (NORVASC) 10 MG tablet Take 1 tablet (10 mg total) by mouth daily. 30 tablet 0   carvedilol (COREG) 12.5 MG tablet Take 12.5 mg by mouth 2 (two) times daily with a meal.     cetirizine (ZYRTEC) 10 MG tablet Take 10 mg by mouth at bedtime.     cinacalcet (SENSIPAR) 60 MG tablet Take 60 mg by mouth at bedtime.     diclofenac Sodium (VOLTAREN) 1 % GEL Apply 1 application  topically 4 (four) times daily as needed (hand,feet,thighs and back pain).     gabapentin (NEURONTIN) 100 MG capsule Take 100 mg by mouth 3 (three) times daily.     levothyroxine (SYNTHROID) 200 MCG tablet Take 200 mcg by mouth daily before breakfast.     megestrol (MEGACE) 40 MG tablet Take 2 tablets (80 mg total) by mouth daily. 30 tablet 0   ondansetron  (ZOFRAN-ODT) 4 MG disintegrating tablet Take 4 mg by mouth every 8 (eight) hours as needed for nausea or vomiting.     VELPHORO 500 MG chewable tablet Chew 500 mg by mouth 3 (three) times daily with meals.     No current facility-administered medications on file prior to visit.    There are no Patient Instructions on file for this visit. No follow-ups on file.   Kris Hartmann, NP

## 2022-04-14 DIAGNOSIS — N186 End stage renal disease: Secondary | ICD-10-CM | POA: Diagnosis not present

## 2022-04-14 DIAGNOSIS — E8779 Other fluid overload: Secondary | ICD-10-CM | POA: Diagnosis not present

## 2022-04-14 DIAGNOSIS — Z992 Dependence on renal dialysis: Secondary | ICD-10-CM | POA: Diagnosis not present

## 2022-04-15 DIAGNOSIS — Z992 Dependence on renal dialysis: Secondary | ICD-10-CM | POA: Diagnosis not present

## 2022-04-15 DIAGNOSIS — N186 End stage renal disease: Secondary | ICD-10-CM | POA: Diagnosis not present

## 2022-04-16 DIAGNOSIS — E8779 Other fluid overload: Secondary | ICD-10-CM | POA: Diagnosis not present

## 2022-04-16 DIAGNOSIS — N186 End stage renal disease: Secondary | ICD-10-CM | POA: Diagnosis not present

## 2022-04-16 DIAGNOSIS — Z992 Dependence on renal dialysis: Secondary | ICD-10-CM | POA: Diagnosis not present

## 2022-04-17 DIAGNOSIS — Z992 Dependence on renal dialysis: Secondary | ICD-10-CM | POA: Diagnosis not present

## 2022-04-17 DIAGNOSIS — N186 End stage renal disease: Secondary | ICD-10-CM | POA: Diagnosis not present

## 2022-04-18 ENCOUNTER — Other Ambulatory Visit: Payer: Self-pay

## 2022-04-18 ENCOUNTER — Inpatient Hospital Stay: Payer: Medicare Other

## 2022-04-18 VITALS — BP 147/91 | HR 95 | Temp 97.9°F | Resp 18

## 2022-04-18 DIAGNOSIS — D631 Anemia in chronic kidney disease: Secondary | ICD-10-CM | POA: Diagnosis not present

## 2022-04-18 DIAGNOSIS — E8779 Other fluid overload: Secondary | ICD-10-CM | POA: Diagnosis not present

## 2022-04-18 DIAGNOSIS — D638 Anemia in other chronic diseases classified elsewhere: Secondary | ICD-10-CM

## 2022-04-18 DIAGNOSIS — N186 End stage renal disease: Secondary | ICD-10-CM | POA: Diagnosis not present

## 2022-04-18 DIAGNOSIS — Z992 Dependence on renal dialysis: Secondary | ICD-10-CM | POA: Diagnosis not present

## 2022-04-18 DIAGNOSIS — I120 Hypertensive chronic kidney disease with stage 5 chronic kidney disease or end stage renal disease: Secondary | ICD-10-CM | POA: Diagnosis not present

## 2022-04-18 DIAGNOSIS — N920 Excessive and frequent menstruation with regular cycle: Secondary | ICD-10-CM | POA: Diagnosis not present

## 2022-04-18 DIAGNOSIS — D649 Anemia, unspecified: Secondary | ICD-10-CM

## 2022-04-18 LAB — CBC WITH DIFFERENTIAL (CANCER CENTER ONLY)
Abs Immature Granulocytes: 0.02 10*3/uL (ref 0.00–0.07)
Basophils Absolute: 0 10*3/uL (ref 0.0–0.1)
Basophils Relative: 1 %
Eosinophils Absolute: 0.4 10*3/uL (ref 0.0–0.5)
Eosinophils Relative: 6 %
HCT: 20 % — ABNORMAL LOW (ref 36.0–46.0)
Hemoglobin: 7.1 g/dL — ABNORMAL LOW (ref 12.0–15.0)
Immature Granulocytes: 0 %
Lymphocytes Relative: 15 %
Lymphs Abs: 0.8 10*3/uL (ref 0.7–4.0)
MCH: 31.6 pg (ref 26.0–34.0)
MCHC: 35.5 g/dL (ref 30.0–36.0)
MCV: 88.9 fL (ref 80.0–100.0)
Monocytes Absolute: 0.6 10*3/uL (ref 0.1–1.0)
Monocytes Relative: 10 %
Neutro Abs: 3.9 10*3/uL (ref 1.7–7.7)
Neutrophils Relative %: 68 %
Platelet Count: 191 10*3/uL (ref 150–400)
RBC: 2.25 MIL/uL — ABNORMAL LOW (ref 3.87–5.11)
RDW: 13.2 % (ref 11.5–15.5)
WBC Count: 5.7 10*3/uL (ref 4.0–10.5)
nRBC: 0 % (ref 0.0–0.2)

## 2022-04-18 LAB — SAMPLE TO BLOOD BANK

## 2022-04-18 MED ORDER — DIPHENHYDRAMINE HCL 25 MG PO CAPS
50.0000 mg | ORAL_CAPSULE | Freq: Once | ORAL | Status: AC
  Administered 2022-04-18: 50 mg via ORAL
  Filled 2022-04-18: qty 2

## 2022-04-18 MED ORDER — ACETAMINOPHEN 325 MG PO TABS
650.0000 mg | ORAL_TABLET | Freq: Once | ORAL | Status: AC
  Administered 2022-04-18: 650 mg via ORAL
  Filled 2022-04-18: qty 2

## 2022-04-18 MED ORDER — SODIUM CHLORIDE 0.9 % IV SOLN: Freq: Once | INTRAVENOUS | Status: AC

## 2022-04-18 MED ORDER — EPOETIN ALFA-EPBX 40000 UNIT/ML IJ SOLN: 30000.0000 [IU] | Freq: Once | INTRAMUSCULAR | Status: DC

## 2022-04-18 MED ORDER — SODIUM CHLORIDE 0.9 % IV SOLN
400.0000 mg | Freq: Once | INTRAVENOUS | Status: AC
  Administered 2022-04-18: 400 mg via INTRAVENOUS
  Filled 2022-04-18: qty 20

## 2022-04-18 NOTE — Patient Instructions (Signed)

## 2022-04-18 NOTE — Progress Notes (Signed)
BP (!) 147/91 (BP Location: Right Arm, Patient Position: Sitting)   Pulse 95   Temp 97.9 F (36.6 C) (Oral)   Resp 18   LMP 03/31/2022 (Exact Date) Comment: pt bleeding last 6 weeks with recent multiple transfusions  SpO2 99%  Patient has had venofer previously through HD and decided not to stay for a 30 minute first time observation. Patient very stable and has higher medical knowledge due to chronic illness. Aware that if she has a reaction that she is to go to the ER.

## 2022-04-19 DIAGNOSIS — N186 End stage renal disease: Secondary | ICD-10-CM | POA: Diagnosis not present

## 2022-04-19 DIAGNOSIS — Z992 Dependence on renal dialysis: Secondary | ICD-10-CM | POA: Diagnosis not present

## 2022-04-20 DIAGNOSIS — Z992 Dependence on renal dialysis: Secondary | ICD-10-CM | POA: Diagnosis not present

## 2022-04-20 DIAGNOSIS — N186 End stage renal disease: Secondary | ICD-10-CM | POA: Diagnosis not present

## 2022-04-20 DIAGNOSIS — E8779 Other fluid overload: Secondary | ICD-10-CM | POA: Diagnosis not present

## 2022-04-21 ENCOUNTER — Telehealth: Payer: Self-pay | Admitting: Physician Assistant

## 2022-04-21 ENCOUNTER — Other Ambulatory Visit: Payer: Self-pay | Admitting: Physician Assistant

## 2022-04-21 DIAGNOSIS — N186 End stage renal disease: Secondary | ICD-10-CM | POA: Diagnosis not present

## 2022-04-21 DIAGNOSIS — Z992 Dependence on renal dialysis: Secondary | ICD-10-CM | POA: Diagnosis not present

## 2022-04-21 DIAGNOSIS — D649 Anemia, unspecified: Secondary | ICD-10-CM

## 2022-04-21 NOTE — Telephone Encounter (Signed)
I called the patient this morning about requiring 1 unit of blood. Left a detailed voicemail to call us back so we can make arrangements.

## 2022-04-21 NOTE — Telephone Encounter (Signed)
I called the patient to see what her availability looks like this week for 1 unit of blood. She can come in tomorrow for a type and screen and then come in Wednesday for 1 unit of blood. I have sent a message to scheduling to facilitate this.

## 2022-04-21 NOTE — Telephone Encounter (Signed)
Contacted patient to scheduled appointments. Patient is aware of appointments that are scheduled.   

## 2022-04-22 ENCOUNTER — Other Ambulatory Visit: Payer: Self-pay | Admitting: Physician Assistant

## 2022-04-22 ENCOUNTER — Inpatient Hospital Stay: Payer: Medicare Other

## 2022-04-22 ENCOUNTER — Other Ambulatory Visit: Payer: Self-pay

## 2022-04-22 ENCOUNTER — Encounter: Payer: Self-pay | Admitting: Physician Assistant

## 2022-04-22 DIAGNOSIS — N186 End stage renal disease: Secondary | ICD-10-CM | POA: Diagnosis not present

## 2022-04-22 DIAGNOSIS — D638 Anemia in other chronic diseases classified elsewhere: Secondary | ICD-10-CM

## 2022-04-22 DIAGNOSIS — Z992 Dependence on renal dialysis: Secondary | ICD-10-CM | POA: Diagnosis not present

## 2022-04-22 DIAGNOSIS — N920 Excessive and frequent menstruation with regular cycle: Secondary | ICD-10-CM | POA: Diagnosis not present

## 2022-04-22 DIAGNOSIS — D649 Anemia, unspecified: Secondary | ICD-10-CM

## 2022-04-22 DIAGNOSIS — D631 Anemia in chronic kidney disease: Secondary | ICD-10-CM | POA: Diagnosis not present

## 2022-04-22 DIAGNOSIS — I120 Hypertensive chronic kidney disease with stage 5 chronic kidney disease or end stage renal disease: Secondary | ICD-10-CM | POA: Diagnosis not present

## 2022-04-22 LAB — CBC WITH DIFFERENTIAL (CANCER CENTER ONLY)
Abs Immature Granulocytes: 0.03 10*3/uL (ref 0.00–0.07)
Basophils Absolute: 0.1 10*3/uL (ref 0.0–0.1)
Basophils Relative: 1 %
Eosinophils Absolute: 0.4 10*3/uL (ref 0.0–0.5)
Eosinophils Relative: 6 %
HCT: 20.7 % — ABNORMAL LOW (ref 36.0–46.0)
Hemoglobin: 7.4 g/dL — ABNORMAL LOW (ref 12.0–15.0)
Immature Granulocytes: 0 %
Lymphocytes Relative: 11 %
Lymphs Abs: 0.8 10*3/uL (ref 0.7–4.0)
MCH: 31.5 pg (ref 26.0–34.0)
MCHC: 35.7 g/dL (ref 30.0–36.0)
MCV: 88.1 fL (ref 80.0–100.0)
Monocytes Absolute: 0.4 10*3/uL (ref 0.1–1.0)
Monocytes Relative: 5 %
Neutro Abs: 5.6 10*3/uL (ref 1.7–7.7)
Neutrophils Relative %: 77 %
Platelet Count: 253 10*3/uL (ref 150–400)
RBC: 2.35 MIL/uL — ABNORMAL LOW (ref 3.87–5.11)
RDW: 13.6 % (ref 11.5–15.5)
WBC Count: 7.3 10*3/uL (ref 4.0–10.5)
nRBC: 0 % (ref 0.0–0.2)

## 2022-04-22 LAB — SAMPLE TO BLOOD BANK

## 2022-04-22 LAB — PREPARE RBC (CROSSMATCH)

## 2022-04-23 ENCOUNTER — Inpatient Hospital Stay: Payer: Medicare Other

## 2022-04-23 ENCOUNTER — Other Ambulatory Visit: Payer: Self-pay | Admitting: Family Medicine

## 2022-04-23 VITALS — BP 136/88 | HR 81 | Temp 97.8°F | Resp 16

## 2022-04-23 DIAGNOSIS — E8779 Other fluid overload: Secondary | ICD-10-CM | POA: Diagnosis not present

## 2022-04-23 DIAGNOSIS — Z992 Dependence on renal dialysis: Secondary | ICD-10-CM | POA: Diagnosis not present

## 2022-04-23 DIAGNOSIS — D649 Anemia, unspecified: Secondary | ICD-10-CM

## 2022-04-23 DIAGNOSIS — N186 End stage renal disease: Secondary | ICD-10-CM | POA: Diagnosis not present

## 2022-04-23 DIAGNOSIS — D631 Anemia in chronic kidney disease: Secondary | ICD-10-CM | POA: Diagnosis not present

## 2022-04-23 DIAGNOSIS — D500 Iron deficiency anemia secondary to blood loss (chronic): Secondary | ICD-10-CM

## 2022-04-23 DIAGNOSIS — N920 Excessive and frequent menstruation with regular cycle: Secondary | ICD-10-CM | POA: Diagnosis not present

## 2022-04-23 DIAGNOSIS — I120 Hypertensive chronic kidney disease with stage 5 chronic kidney disease or end stage renal disease: Secondary | ICD-10-CM | POA: Diagnosis not present

## 2022-04-23 LAB — TYPE AND SCREEN
ABO/RH(D): O POS
Antibody Screen: NEGATIVE
Donor AG Type: NEGATIVE
Donor AG Type: NEGATIVE
Unit division: 0
Unit division: 0

## 2022-04-23 LAB — BPAM RBC
Blood Product Expiration Date: 202309172359
Blood Product Expiration Date: 202310022359
Unit Type and Rh: 5100
Unit Type and Rh: 5100

## 2022-04-23 LAB — SAMPLE TO BLOOD BANK

## 2022-04-23 MED ORDER — SODIUM CHLORIDE 0.9% IV SOLUTION
250.0000 mL | Freq: Once | INTRAVENOUS | Status: AC
  Administered 2022-04-23: 250 mL via INTRAVENOUS

## 2022-04-23 MED ORDER — EPOETIN ALFA-EPBX 40000 UNIT/ML IJ SOLN: 30000.0000 [IU] | Freq: Once | INTRAMUSCULAR | Status: DC

## 2022-04-23 MED ORDER — DIPHENHYDRAMINE HCL 25 MG PO CAPS
25.0000 mg | ORAL_CAPSULE | Freq: Once | ORAL | Status: AC
  Administered 2022-04-23: 25 mg via ORAL
  Filled 2022-04-23: qty 1

## 2022-04-23 MED ORDER — EPOETIN ALFA-EPBX 20000 UNIT/ML IJ SOLN
20000.0000 [IU] | Freq: Once | INTRAMUSCULAR | Status: AC
  Administered 2022-04-23: 20000 [IU] via SUBCUTANEOUS
  Filled 2022-04-23: qty 1

## 2022-04-23 MED ORDER — ACETAMINOPHEN 325 MG PO TABS
650.0000 mg | ORAL_TABLET | Freq: Once | ORAL | Status: AC
  Administered 2022-04-23: 650 mg via ORAL
  Filled 2022-04-23: qty 2

## 2022-04-23 MED ORDER — EPOETIN ALFA-EPBX 10000 UNIT/ML IJ SOLN
10000.0000 [IU] | Freq: Once | INTRAMUSCULAR | Status: AC
  Administered 2022-04-23: 10000 [IU] via SUBCUTANEOUS
  Filled 2022-04-23: qty 1

## 2022-04-24 ENCOUNTER — Inpatient Hospital Stay: Payer: Medicare Other

## 2022-04-24 DIAGNOSIS — Z992 Dependence on renal dialysis: Secondary | ICD-10-CM | POA: Diagnosis not present

## 2022-04-24 DIAGNOSIS — N186 End stage renal disease: Secondary | ICD-10-CM | POA: Diagnosis not present

## 2022-04-24 LAB — BPAM RBC
Blood Product Expiration Date: 202309172359
ISSUE DATE / TIME: 202308301240
Unit Type and Rh: 5100

## 2022-04-24 LAB — TYPE AND SCREEN
ABO/RH(D): O POS
Antibody Screen: NEGATIVE
Donor AG Type: NEGATIVE
Unit division: 0

## 2022-04-25 DIAGNOSIS — D649 Anemia, unspecified: Secondary | ICD-10-CM | POA: Diagnosis not present

## 2022-04-25 DIAGNOSIS — N921 Excessive and frequent menstruation with irregular cycle: Secondary | ICD-10-CM | POA: Diagnosis not present

## 2022-04-25 DIAGNOSIS — E8779 Other fluid overload: Secondary | ICD-10-CM | POA: Diagnosis not present

## 2022-04-25 DIAGNOSIS — N186 End stage renal disease: Secondary | ICD-10-CM | POA: Diagnosis not present

## 2022-04-25 DIAGNOSIS — Z992 Dependence on renal dialysis: Secondary | ICD-10-CM | POA: Diagnosis not present

## 2022-04-26 DIAGNOSIS — Z992 Dependence on renal dialysis: Secondary | ICD-10-CM | POA: Diagnosis not present

## 2022-04-26 DIAGNOSIS — E8770 Fluid overload, unspecified: Secondary | ICD-10-CM | POA: Diagnosis not present

## 2022-04-26 DIAGNOSIS — N186 End stage renal disease: Secondary | ICD-10-CM | POA: Diagnosis not present

## 2022-04-27 DIAGNOSIS — E8779 Other fluid overload: Secondary | ICD-10-CM | POA: Diagnosis not present

## 2022-04-27 DIAGNOSIS — Z992 Dependence on renal dialysis: Secondary | ICD-10-CM | POA: Diagnosis not present

## 2022-04-27 DIAGNOSIS — N186 End stage renal disease: Secondary | ICD-10-CM | POA: Diagnosis not present

## 2022-04-28 DIAGNOSIS — E8779 Other fluid overload: Secondary | ICD-10-CM | POA: Diagnosis not present

## 2022-04-28 DIAGNOSIS — N186 End stage renal disease: Secondary | ICD-10-CM | POA: Diagnosis not present

## 2022-04-28 DIAGNOSIS — Z992 Dependence on renal dialysis: Secondary | ICD-10-CM | POA: Diagnosis not present

## 2022-04-29 DIAGNOSIS — Z992 Dependence on renal dialysis: Secondary | ICD-10-CM | POA: Diagnosis not present

## 2022-04-29 DIAGNOSIS — E8770 Fluid overload, unspecified: Secondary | ICD-10-CM | POA: Diagnosis not present

## 2022-04-29 DIAGNOSIS — N186 End stage renal disease: Secondary | ICD-10-CM | POA: Diagnosis not present

## 2022-04-30 DIAGNOSIS — Z992 Dependence on renal dialysis: Secondary | ICD-10-CM | POA: Diagnosis not present

## 2022-04-30 DIAGNOSIS — N946 Dysmenorrhea, unspecified: Secondary | ICD-10-CM | POA: Diagnosis not present

## 2022-04-30 DIAGNOSIS — N921 Excessive and frequent menstruation with irregular cycle: Secondary | ICD-10-CM | POA: Diagnosis not present

## 2022-04-30 DIAGNOSIS — N186 End stage renal disease: Secondary | ICD-10-CM | POA: Diagnosis not present

## 2022-04-30 DIAGNOSIS — E8779 Other fluid overload: Secondary | ICD-10-CM | POA: Diagnosis not present

## 2022-04-30 DIAGNOSIS — D649 Anemia, unspecified: Secondary | ICD-10-CM | POA: Diagnosis not present

## 2022-05-01 ENCOUNTER — Inpatient Hospital Stay: Payer: BC Managed Care – PPO | Attending: Physician Assistant

## 2022-05-01 ENCOUNTER — Inpatient Hospital Stay: Payer: BC Managed Care – PPO

## 2022-05-01 VITALS — BP 138/87 | HR 100 | Temp 98.6°F | Resp 18

## 2022-05-01 DIAGNOSIS — D649 Anemia, unspecified: Secondary | ICD-10-CM

## 2022-05-01 DIAGNOSIS — N186 End stage renal disease: Secondary | ICD-10-CM | POA: Diagnosis not present

## 2022-05-01 DIAGNOSIS — D638 Anemia in other chronic diseases classified elsewhere: Secondary | ICD-10-CM

## 2022-05-01 DIAGNOSIS — N920 Excessive and frequent menstruation with regular cycle: Secondary | ICD-10-CM | POA: Diagnosis not present

## 2022-05-01 DIAGNOSIS — I120 Hypertensive chronic kidney disease with stage 5 chronic kidney disease or end stage renal disease: Secondary | ICD-10-CM | POA: Diagnosis not present

## 2022-05-01 DIAGNOSIS — E8779 Other fluid overload: Secondary | ICD-10-CM | POA: Diagnosis not present

## 2022-05-01 DIAGNOSIS — D631 Anemia in chronic kidney disease: Secondary | ICD-10-CM | POA: Diagnosis not present

## 2022-05-01 DIAGNOSIS — Z992 Dependence on renal dialysis: Secondary | ICD-10-CM | POA: Diagnosis not present

## 2022-05-01 LAB — CBC WITH DIFFERENTIAL (CANCER CENTER ONLY)
Abs Immature Granulocytes: 0.02 10*3/uL (ref 0.00–0.07)
Basophils Absolute: 0 10*3/uL (ref 0.0–0.1)
Basophils Relative: 1 %
Eosinophils Absolute: 0.3 10*3/uL (ref 0.0–0.5)
Eosinophils Relative: 4 %
HCT: 25.4 % — ABNORMAL LOW (ref 36.0–46.0)
Hemoglobin: 9.1 g/dL — ABNORMAL LOW (ref 12.0–15.0)
Immature Granulocytes: 0 %
Lymphocytes Relative: 11 %
Lymphs Abs: 0.8 10*3/uL (ref 0.7–4.0)
MCH: 32.4 pg (ref 26.0–34.0)
MCHC: 35.8 g/dL (ref 30.0–36.0)
MCV: 90.4 fL (ref 80.0–100.0)
Monocytes Absolute: 0.6 10*3/uL (ref 0.1–1.0)
Monocytes Relative: 8 %
Neutro Abs: 5.7 10*3/uL (ref 1.7–7.7)
Neutrophils Relative %: 76 %
Platelet Count: 217 10*3/uL (ref 150–400)
RBC: 2.81 MIL/uL — ABNORMAL LOW (ref 3.87–5.11)
RDW: 15.3 % (ref 11.5–15.5)
WBC Count: 7.4 10*3/uL (ref 4.0–10.5)
nRBC: 0 % (ref 0.0–0.2)

## 2022-05-01 LAB — SAMPLE TO BLOOD BANK

## 2022-05-01 MED ORDER — EPOETIN ALFA-EPBX 20000 UNIT/ML IJ SOLN
20000.0000 [IU] | Freq: Once | INTRAMUSCULAR | Status: AC
  Administered 2022-05-01: 20000 [IU] via SUBCUTANEOUS
  Filled 2022-05-01: qty 1

## 2022-05-01 MED ORDER — EPOETIN ALFA-EPBX 10000 UNIT/ML IJ SOLN
10000.0000 [IU] | Freq: Once | INTRAMUSCULAR | Status: AC
  Administered 2022-05-01: 10000 [IU] via SUBCUTANEOUS
  Filled 2022-05-01: qty 1

## 2022-05-02 DIAGNOSIS — Z992 Dependence on renal dialysis: Secondary | ICD-10-CM | POA: Diagnosis not present

## 2022-05-02 DIAGNOSIS — N939 Abnormal uterine and vaginal bleeding, unspecified: Secondary | ICD-10-CM | POA: Diagnosis not present

## 2022-05-02 DIAGNOSIS — N8003 Adenomyosis of the uterus: Secondary | ICD-10-CM | POA: Diagnosis not present

## 2022-05-02 DIAGNOSIS — E8779 Other fluid overload: Secondary | ICD-10-CM | POA: Diagnosis not present

## 2022-05-02 DIAGNOSIS — N186 End stage renal disease: Secondary | ICD-10-CM | POA: Diagnosis not present

## 2022-05-02 DIAGNOSIS — R55 Syncope and collapse: Secondary | ICD-10-CM | POA: Diagnosis not present

## 2022-05-02 DIAGNOSIS — D250 Submucous leiomyoma of uterus: Secondary | ICD-10-CM | POA: Diagnosis not present

## 2022-05-03 DIAGNOSIS — N186 End stage renal disease: Secondary | ICD-10-CM | POA: Diagnosis not present

## 2022-05-03 DIAGNOSIS — E8779 Other fluid overload: Secondary | ICD-10-CM | POA: Diagnosis not present

## 2022-05-03 DIAGNOSIS — Z992 Dependence on renal dialysis: Secondary | ICD-10-CM | POA: Diagnosis not present

## 2022-05-04 DIAGNOSIS — N186 End stage renal disease: Secondary | ICD-10-CM | POA: Diagnosis not present

## 2022-05-04 DIAGNOSIS — E8779 Other fluid overload: Secondary | ICD-10-CM | POA: Diagnosis not present

## 2022-05-04 DIAGNOSIS — Z992 Dependence on renal dialysis: Secondary | ICD-10-CM | POA: Diagnosis not present

## 2022-05-04 HISTORY — PX: UTERINE ARTERY EMBOLIZATION: SHX2629

## 2022-05-05 DIAGNOSIS — Z992 Dependence on renal dialysis: Secondary | ICD-10-CM | POA: Diagnosis not present

## 2022-05-05 DIAGNOSIS — N186 End stage renal disease: Secondary | ICD-10-CM | POA: Diagnosis not present

## 2022-05-05 DIAGNOSIS — E8779 Other fluid overload: Secondary | ICD-10-CM | POA: Diagnosis not present

## 2022-05-06 DIAGNOSIS — Z992 Dependence on renal dialysis: Secondary | ICD-10-CM | POA: Diagnosis not present

## 2022-05-06 DIAGNOSIS — E8779 Other fluid overload: Secondary | ICD-10-CM | POA: Diagnosis not present

## 2022-05-06 DIAGNOSIS — N186 End stage renal disease: Secondary | ICD-10-CM | POA: Diagnosis not present

## 2022-05-07 DIAGNOSIS — E8779 Other fluid overload: Secondary | ICD-10-CM | POA: Diagnosis not present

## 2022-05-07 DIAGNOSIS — N186 End stage renal disease: Secondary | ICD-10-CM | POA: Diagnosis not present

## 2022-05-07 DIAGNOSIS — Z992 Dependence on renal dialysis: Secondary | ICD-10-CM | POA: Diagnosis not present

## 2022-05-07 NOTE — Progress Notes (Signed)
[place] OFFICE PROGRESS NOTE  Pcp, No No address on file  DIAGNOSIS: Anemia secondary to end-stage renal disease and menorrhagia  PRIOR THERAPY: IR embolization performed in September 2023  CURRENT THERAPY: 1) blood transfusions as needed most recent on 04/23/22 2) Retacrit injections weekly 3) as needed IV iron with Venofer most recent 04/18/22  INTERVAL HISTORY: Holly [name] 36 y.o. female returns to the clinic for a follow-up visit.  The patient was first in the clinic on 03/31/2022.  The patient was referred due to significant anemia.  The patient has end-stage renal disease for which she is followed by nephrology.  She was previously receiving ESA injections with Micera without appreciable response.  Therefore, we started her on weekly Retacrit.  She seems to be responding to Retacrit better and has needed a lot less blood transfusions in the interval.  The patient is wondering if we can write a letter to her nephrology office to see if they could consider changing her back to Retacrit to avoid having to go to multiple doctors offices.  She also has been experiencing menorrhagia.  She was referred to a specialist at [facility] recently had an appointment with them.  They performed an IR embolization which has lightened up her menstrual bleeding.  The patient desires a hysterectomy although they felt that would be dangerous and with significant risk involved due to her significant anemia at this time.  She reports that they are planning to do a pelvic MRI soon and they suspect endometriosis.  She did not have any fibroids per patient report.  Also discussed that they may consider another IUD.  She previously bled out for IUDs in the past.   Since last being seen, she received a blood transfusion on 04/23/22.  She is only required 3 blood transfusions since being seen on 03/31/2022, 2 of which were performed due to labs drawn at that appointment.  She received IV iron with venofer  500 mg x2. Her fatigue is slightly improved.  She reports she still has fatigue but it may be secondary to the pain medication (Dilaudid) that she is prescribed from her recent procedure.  Previously had some dyspnea on exertion, particularly with stairs, but reports this has been better recently.  She also denies any recent lightheadedness or syncope. The patient is currently not taking any iron supplements due to lack of benefit and GI upset.  She reports that she is taking a medication from her nephrologist that does contain iron.  She has tried prescription iron as well as prescription iron without any improvement.   She denies any other abnormal bleeding including gingival bleeding, hemoptysis, hematemesis, or hematuria.  Whenever, she mentions that she sometimes has rectal bleeding when she has menstrual cycles.  Hoping that this is evaluated on upcoming pelvic MRI.  Denies any blood thinner use except she uses heparin to flush her port for her dialysis. Denies any chills, night sweats, unexplained weight loss, or lymphadenopathy.  Denies any NSAID use. She is here for evaluation and repeat lab work.     MEDICAL HISTORY: Past Medical History:  Diagnosis Date   Anemia    Anxiety    Arthritis    hands   Blood transfusion without reported diagnosis    Cancer ([place])    Complication of anesthesia    with Fentanyl she is difficult to awake   Depression    Dialysis patient ([place])    Dyspnea    due to anemia  Epilepsy ([place])    Hashimoto's disease    History of kidney stones    HLD (hyperlipidemia)    Hypertension    Hypothyroidism    Pneumonia    Renal disorder    Thyroid disease     ALLERGIES:  is allergic to bupropion, ciprofloxacin, levetiracetam, rituximab, tramadol, fentanyl, and oxycodone.  MEDICATIONS:  Current Outpatient Medications  Medication Sig Dispense Refill   acetaminophen (TYLENOL) 500 MG tablet Take 1,000 mg by mouth every 6 (six) hours as needed for headache (pain).      acyclovir (ZOVIRAX) 200 MG capsule Take 200 mg by mouth 2 (two) times daily as needed (out breaks).     albuterol (VENTOLIN HFA) 108 (90 Base) MCG/ACT inhaler Inhale 2-4 puffs into the lungs every 4 (four) hours as needed for wheezing (or cough). 18 g 0   amLODipine (NORVASC) 10 MG tablet Take 1 tablet (10 mg total) by mouth daily. 30 tablet 0   carvedilol (COREG) 12.5 MG tablet Take 12.5 mg by mouth 2 (two) times daily with a meal.     cetirizine (ZYRTEC) 10 MG tablet Take 10 mg by mouth at bedtime.     cinacalcet (SENSIPAR) 60 MG tablet Take 60 mg by mouth at bedtime.     diclofenac Sodium (VOLTAREN) 1 % GEL Apply 1 application  topically 4 (four) times daily as needed (hand,feet,thighs and back pain).     gabapentin (NEURONTIN) 100 MG capsule Take 100 mg by mouth 3 (three) times daily.     levothyroxine (SYNTHROID) 200 MCG tablet Take 200 mcg by mouth daily before breakfast.     megestrol (MEGACE) 40 MG tablet Take 2 tablets (80 mg total) by mouth daily. 30 tablet 0   ondansetron (ZOFRAN-ODT) 4 MG disintegrating tablet Take 4 mg by mouth every 8 (eight) hours as needed for nausea or vomiting.     VELPHORO 500 MG chewable tablet Chew 500 mg by mouth 3 (three) times daily with meals.     No current facility-administered medications for this visit.    SURGICAL HISTORY:  Past Surgical History:  Procedure Laterality Date   ABDOMINAL SURGERY     AV FISTULA PLACEMENT Left 01/17/2022   Procedure: ARTERIOVENOUS (AV) FISTULA CREATION ( RADIAL CEPHALIC);  Surgeon: Katha Cabal, MD;  Location: [facility] ORS;  Service: Vascular;  Laterality: Left;   CERVICAL BIOPSY  W/ LOOP ELECTRODE EXCISION  2018   COLONOSCOPY N/A 01/30/2022   Procedure: COLONOSCOPY;  Surgeon: Annamaria Helling, DO;  Location: [facility] ENDOSCOPY;  Service: Gastroenterology;  Laterality: N/A;   COLONOSCOPY WITH PROPOFOL N/A 02/01/2022   Procedure: COLONOSCOPY WITH PROPOFOL;  Surgeon: Lesly Rubenstein, MD;  Location: [facility]  ENDOSCOPY;  Service: Endoscopy;  Laterality: N/A;   DIAGNOSTIC LAPAROSCOPY     due to removal of PD catheters   ESOPHAGOGASTRODUODENOSCOPY N/A 01/30/2022   Procedure: ESOPHAGOGASTRODUODENOSCOPY (EGD);  Surgeon: Annamaria Helling, DO;  Location: [facility] ENDOSCOPY;  Service: Gastroenterology;  Laterality: N/A;   ESOPHAGOGASTRODUODENOSCOPY (EGD) WITH PROPOFOL N/A 02/01/2022   Procedure: ESOPHAGOGASTRODUODENOSCOPY (EGD) WITH PROPOFOL;  Surgeon: Lesly Rubenstein, MD;  Location: [facility] ENDOSCOPY;  Service: Endoscopy;  Laterality: N/A;   RENAL BIOPSY     x 2   TUMOR REMOVAL     right face    REVIEW OF SYSTEMS:   Constitutional: Positive for stable/slightly improved fatigue.  Negative for appetite change, chills, fever and unexpected weight change.  HENT: Negative for mouth sores, nosebleeds, sore throat and trouble swallowing.   Eyes: Negative for  eye problems and icterus.  Respiratory:  Negative for cough, hemoptysis, dyspnea on exertion and wheezing.   Cardiovascular: Negative for chest pain and leg swelling.  Gastrointestinal: Negative for abdominal pain, constipation, diarrhea, nausea and vomiting.  Genitourinary: Positive for menorrhagia (improving). negative for bladder incontinence, difficulty urinating, dysuria, frequency and hematuria.   Musculoskeletal: Negative for back pain, gait problem, neck pain and neck stiffness.  Skin: Negative for itching and rash.  Neurological: Negative for dizziness, extremity weakness, gait problem, headaches, and seizures.  Hematological: Negative for adenopathy.  Psychiatric/Behavioral: Negative for confusion, depression and sleep disturbance. The patient is not nervous/anxious.       PHYSICAL EXAMINATION:  Last menstrual period 03/31/2022.  ECOG PERFORMANCE STATUS: 1  Physical Exam  Constitutional: Oriented to person, place, and time and well-developed, well-nourished, and in no distress.  HENT:  Head: Normocephalic and atraumatic.   Mouth/Throat: Oropharynx is clear and moist. No oropharyngeal exudate.  Eyes: Conjunctivae are normal. Right eye exhibits no discharge. Left eye exhibits no discharge. No scleral icterus.  Neck: Normal range of motion. Neck supple.  Cardiovascular: Normal rate, regular rhythm, normal heart sounds and intact distal pulses.   Pulmonary/Chest: Effort normal and breath sounds normal. No respiratory distress. No wheezes. No rales.  Abdominal: Soft. Bowel sounds are normal. Exhibits no distension and no mass. There is no tenderness.  Musculoskeletal: Normal range of motion. Exhibits no edema.  Lymphadenopathy:    No cervical adenopathy.  Neurological: Alert and oriented to person, place, and time. Exhibits normal muscle tone. Gait normal. Coordination normal.  Skin: Skin is warm and dry. No rash noted. Not diaphoretic. No erythema. No pallor.  Psychiatric: Mood, memory and judgment normal.  Vitals reviewed.  LABORATORY DATA: Lab Results  Component Value Date   WBC 7.4 05/01/2022   HGB 9.1 (L) 05/01/2022   HCT 25.4 (L) 05/01/2022   MCV 90.4 05/01/2022   PLT 217 05/01/2022      Chemistry      Component Value Date/Time   NA 137 04/11/2022 1220   K 5.1 04/11/2022 1220   CL 100 04/11/2022 1220   CO2 26 04/11/2022 1220   BUN 31 (H) 04/11/2022 1220   CREATININE 8.76 (H) 04/11/2022 1220   CREATININE 9.97 (HH) 03/31/2022 1310      Component Value Date/Time   CALCIUM 10.0 04/11/2022 1220   ALKPHOS 71 04/11/2022 1220   AST 12 (L) 04/11/2022 1220   AST 9 (L) 03/31/2022 1310   ALT 13 04/11/2022 1220   ALT 13 03/31/2022 1310   BILITOT 0.6 04/11/2022 1220   BILITOT 0.3 03/31/2022 1310       RADIOGRAPHIC STUDIES:  CT Angio Chest PE W/Cm &/Or Wo Cm  Result Date: 04/11/2022 CLINICAL DATA:  Shortness of breath, cough EXAM: CT ANGIOGRAPHY CHEST WITH CONTRAST TECHNIQUE: Multidetector CT imaging of the chest was performed using the standard protocol during bolus administration of  intravenous contrast. Multiplanar CT image reconstructions and MIPs were obtained to evaluate the vascular anatomy. RADIATION DOSE REDUCTION: This exam was performed according to the departmental dose-optimization program which includes automated exposure control, adjustment of the mA and/or kV according to patient size and/or use of iterative reconstruction technique. CONTRAST:  56m OMNIPAQUE IOHEXOL 350 MG/ML SOLN COMPARISON:  11/08/2021 FINDINGS: Cardiovascular: There is homogeneous enhancement in thoracic aorta. There are no intraluminal filling defects in central pulmonary artery branches. Evaluation of small peripheral branches is limited by motion artifacts. Minimal pericardial effusion is present. Mediastinum/Nodes: No significant lymphadenopathy seen. Lungs/Pleura: There is  no focal pulmonary consolidation. There is no pleural effusion or pneumothorax. Upper Abdomen: There is 1.7 cm cyst in the left lobe of liver. Musculoskeletal: Unremarkable. Review of the MIP images confirms the above findings. IMPRESSION: There is no evidence of central pulmonary artery embolism. There is no evidence of thoracic aortic dissection. There is no focal pulmonary consolidation. Minimal pericardial effusion.  Hepatic cyst. Electronically Signed   By: Elmer Picker M.D.   On: 04/11/2022 14:47   DG Chest 1 View  Result Date: 04/11/2022 CLINICAL DATA:  Cough and shortness of breath for the past 2 days. EXAM: CHEST  1 VIEW COMPARISON:  02/18/2022 FINDINGS: Normal sized heart. Clear lungs with normal vascularity. Stable right jugular catheter with its tip at the superior cavoatrial junction. Mild scoliosis. IMPRESSION: No active disease. Electronically Signed   By: Claudie Revering M.D.   On: 04/11/2022 12:04     ASSESSMENT/PLAN:  This is a very pleasant 36 year old Caucasian female referred to the clinic for anemia secondary to end-stage renal disease on dialysis and menorrhagia   The patient was first seen on  03/31/2022 Dr. Julien Nordmann discussed that the patient's anemia is likely secondary to menorrhagia and end-stage renal disease.  Treatment from our standpoint is supportive with EPO injections with Retacrit weekly.  She is also given Venofer 500 milligrams x2.  She also receives blood transfusions as needed. Her last blood transfusion was on 04/23/22.   The patient had repeat labs today.  Her labs show globin of 8.0.  Patient is hoping to avoid multiple doctors offices and she is hoping that she can receive Retacrit at her nephrology office.  She is requesting that we write a letter asking them to switch her from [place] to [place] as she has responded to Retacrit and has not required as many blood transfusions in the interval compared to prior.  We will write this letter as requested.  We will continue to monitor her closely until she can arrange appointments to be followed at her nephrology office.  We will continue to arrange for Retacrit injections weekly at the cancer center.  She does not need a blood transfusion at this time but may require a blood transfusion in the next few weeks.  I will arrange for a lab appointment next week and we will arrange for blood transfusion if her hemoglobin is less than 8.  We will see her back for follow-up visit in 1 month for labs, follow-up visit, and Retacrit injection.  I reviewed the plan with Dr. Julien Nordmann who is in agreement.   She will continue to follow with [facility] regarding her menorrhagia.  She recently had a IR embolization which has lightened up her menstrual periods.  She is going to have a pelvic MRI soon.  She will continue to follow with Dr.Singh from the dialysis clinic.  The patient was advised to call immediately if she has any concerning symptoms in the interval. The patient voices understanding of current disease status and treatment options and is in agreement with the current care plan. All questions were answered. The patient knows to call the  clinic with any problems, questions or concerns. We can certainly see the patient much sooner if necessary      No orders of the defined types were placed in this encounter.    The total time spent in the appointment was 20-29 minutes   Albi Rappaport L Sarahelizabeth Conway, PA-C 05/07/22

## 2022-05-08 ENCOUNTER — Inpatient Hospital Stay: Payer: BC Managed Care – PPO

## 2022-05-08 ENCOUNTER — Other Ambulatory Visit: Payer: Self-pay

## 2022-05-08 ENCOUNTER — Inpatient Hospital Stay (HOSPITAL_BASED_OUTPATIENT_CLINIC_OR_DEPARTMENT_OTHER): Payer: BC Managed Care – PPO | Admitting: Physician Assistant

## 2022-05-08 VITALS — BP 137/79 | HR 82 | Temp 98.0°F | Resp 14 | Wt 218.2 lb

## 2022-05-08 DIAGNOSIS — Z992 Dependence on renal dialysis: Secondary | ICD-10-CM

## 2022-05-08 DIAGNOSIS — D631 Anemia in chronic kidney disease: Secondary | ICD-10-CM | POA: Diagnosis not present

## 2022-05-08 DIAGNOSIS — D638 Anemia in other chronic diseases classified elsewhere: Secondary | ICD-10-CM | POA: Diagnosis not present

## 2022-05-08 DIAGNOSIS — N920 Excessive and frequent menstruation with regular cycle: Secondary | ICD-10-CM | POA: Diagnosis not present

## 2022-05-08 DIAGNOSIS — D649 Anemia, unspecified: Secondary | ICD-10-CM

## 2022-05-08 DIAGNOSIS — N186 End stage renal disease: Secondary | ICD-10-CM | POA: Diagnosis not present

## 2022-05-08 DIAGNOSIS — E8779 Other fluid overload: Secondary | ICD-10-CM | POA: Diagnosis not present

## 2022-05-08 DIAGNOSIS — I120 Hypertensive chronic kidney disease with stage 5 chronic kidney disease or end stage renal disease: Secondary | ICD-10-CM | POA: Diagnosis not present

## 2022-05-08 LAB — SAMPLE TO BLOOD BANK

## 2022-05-08 LAB — CBC WITH DIFFERENTIAL (CANCER CENTER ONLY)
Abs Immature Granulocytes: 0.04 10*3/uL (ref 0.00–0.07)
Basophils Absolute: 0 10*3/uL (ref 0.0–0.1)
Basophils Relative: 0 %
Eosinophils Absolute: 0.3 10*3/uL (ref 0.0–0.5)
Eosinophils Relative: 4 %
HCT: 24.2 % — ABNORMAL LOW (ref 36.0–46.0)
Hemoglobin: 8 g/dL — ABNORMAL LOW (ref 12.0–15.0)
Immature Granulocytes: 1 %
Lymphocytes Relative: 13 %
Lymphs Abs: 1.1 10*3/uL (ref 0.7–4.0)
MCH: 32.1 pg (ref 26.0–34.0)
MCHC: 33.1 g/dL (ref 30.0–36.0)
MCV: 97.2 fL (ref 80.0–100.0)
Monocytes Absolute: 0.6 10*3/uL (ref 0.1–1.0)
Monocytes Relative: 7 %
Neutro Abs: 6.3 10*3/uL (ref 1.7–7.7)
Neutrophils Relative %: 75 %
Platelet Count: 185 10*3/uL (ref 150–400)
RBC: 2.49 MIL/uL — ABNORMAL LOW (ref 3.87–5.11)
RDW: 16 % — ABNORMAL HIGH (ref 11.5–15.5)
WBC Count: 8.3 10*3/uL (ref 4.0–10.5)
nRBC: 0 % (ref 0.0–0.2)

## 2022-05-08 MED ORDER — EPOETIN ALFA-EPBX 20000 UNIT/ML IJ SOLN
20000.0000 [IU] | Freq: Once | INTRAMUSCULAR | Status: AC
  Administered 2022-05-08: 20000 [IU] via SUBCUTANEOUS
  Filled 2022-05-08: qty 1

## 2022-05-08 MED ORDER — EPOETIN ALFA-EPBX 10000 UNIT/ML IJ SOLN
10000.0000 [IU] | Freq: Once | INTRAMUSCULAR | Status: AC
  Administered 2022-05-08: 10000 [IU] via SUBCUTANEOUS
  Filled 2022-05-08: qty 1

## 2022-05-08 NOTE — Patient Instructions (Signed)

## 2022-05-09 DIAGNOSIS — E8779 Other fluid overload: Secondary | ICD-10-CM | POA: Diagnosis not present

## 2022-05-09 DIAGNOSIS — Z992 Dependence on renal dialysis: Secondary | ICD-10-CM | POA: Diagnosis not present

## 2022-05-09 DIAGNOSIS — N186 End stage renal disease: Secondary | ICD-10-CM | POA: Diagnosis not present

## 2022-05-10 DIAGNOSIS — Z992 Dependence on renal dialysis: Secondary | ICD-10-CM | POA: Diagnosis not present

## 2022-05-10 DIAGNOSIS — E8779 Other fluid overload: Secondary | ICD-10-CM | POA: Diagnosis not present

## 2022-05-10 DIAGNOSIS — N186 End stage renal disease: Secondary | ICD-10-CM | POA: Diagnosis not present

## 2022-05-11 DIAGNOSIS — N186 End stage renal disease: Secondary | ICD-10-CM | POA: Diagnosis not present

## 2022-05-11 DIAGNOSIS — E8779 Other fluid overload: Secondary | ICD-10-CM | POA: Diagnosis not present

## 2022-05-11 DIAGNOSIS — Z992 Dependence on renal dialysis: Secondary | ICD-10-CM | POA: Diagnosis not present

## 2022-05-12 DIAGNOSIS — E8779 Other fluid overload: Secondary | ICD-10-CM | POA: Diagnosis not present

## 2022-05-12 DIAGNOSIS — N186 End stage renal disease: Secondary | ICD-10-CM | POA: Diagnosis not present

## 2022-05-12 DIAGNOSIS — Z992 Dependence on renal dialysis: Secondary | ICD-10-CM | POA: Diagnosis not present

## 2022-05-13 DIAGNOSIS — Z992 Dependence on renal dialysis: Secondary | ICD-10-CM | POA: Diagnosis not present

## 2022-05-13 DIAGNOSIS — E8779 Other fluid overload: Secondary | ICD-10-CM | POA: Diagnosis not present

## 2022-05-13 DIAGNOSIS — N186 End stage renal disease: Secondary | ICD-10-CM | POA: Diagnosis not present

## 2022-05-14 DIAGNOSIS — Z992 Dependence on renal dialysis: Secondary | ICD-10-CM | POA: Diagnosis not present

## 2022-05-14 DIAGNOSIS — N186 End stage renal disease: Secondary | ICD-10-CM | POA: Diagnosis not present

## 2022-05-14 DIAGNOSIS — E8779 Other fluid overload: Secondary | ICD-10-CM | POA: Diagnosis not present

## 2022-05-15 ENCOUNTER — Inpatient Hospital Stay: Payer: BC Managed Care – PPO

## 2022-05-15 ENCOUNTER — Other Ambulatory Visit: Payer: Self-pay | Admitting: Physician Assistant

## 2022-05-15 VITALS — BP 134/94 | HR 79 | Temp 98.7°F | Resp 16

## 2022-05-15 DIAGNOSIS — I120 Hypertensive chronic kidney disease with stage 5 chronic kidney disease or end stage renal disease: Secondary | ICD-10-CM | POA: Diagnosis not present

## 2022-05-15 DIAGNOSIS — E8779 Other fluid overload: Secondary | ICD-10-CM | POA: Diagnosis not present

## 2022-05-15 DIAGNOSIS — N186 End stage renal disease: Secondary | ICD-10-CM | POA: Diagnosis not present

## 2022-05-15 DIAGNOSIS — D638 Anemia in other chronic diseases classified elsewhere: Secondary | ICD-10-CM

## 2022-05-15 DIAGNOSIS — D631 Anemia in chronic kidney disease: Secondary | ICD-10-CM | POA: Diagnosis not present

## 2022-05-15 DIAGNOSIS — Z992 Dependence on renal dialysis: Secondary | ICD-10-CM | POA: Diagnosis not present

## 2022-05-15 DIAGNOSIS — D649 Anemia, unspecified: Secondary | ICD-10-CM

## 2022-05-15 DIAGNOSIS — N920 Excessive and frequent menstruation with regular cycle: Secondary | ICD-10-CM | POA: Diagnosis not present

## 2022-05-15 LAB — CBC WITH DIFFERENTIAL (CANCER CENTER ONLY)
Abs Immature Granulocytes: 0.01 10*3/uL (ref 0.00–0.07)
Basophils Absolute: 0 10*3/uL (ref 0.0–0.1)
Basophils Relative: 0 %
Eosinophils Absolute: 0.2 10*3/uL (ref 0.0–0.5)
Eosinophils Relative: 3 %
HCT: 24 % — ABNORMAL LOW (ref 36.0–46.0)
Hemoglobin: 8.1 g/dL — ABNORMAL LOW (ref 12.0–15.0)
Immature Granulocytes: 0 %
Lymphocytes Relative: 16 %
Lymphs Abs: 1.2 10*3/uL (ref 0.7–4.0)
MCH: 32.4 pg (ref 26.0–34.0)
MCHC: 33.8 g/dL (ref 30.0–36.0)
MCV: 96 fL (ref 80.0–100.0)
Monocytes Absolute: 0.5 10*3/uL (ref 0.1–1.0)
Monocytes Relative: 7 %
Neutro Abs: 5.6 10*3/uL (ref 1.7–7.7)
Neutrophils Relative %: 74 %
Platelet Count: 126 10*3/uL — ABNORMAL LOW (ref 150–400)
RBC: 2.5 MIL/uL — ABNORMAL LOW (ref 3.87–5.11)
RDW: 15.8 % — ABNORMAL HIGH (ref 11.5–15.5)
WBC Count: 7.6 10*3/uL (ref 4.0–10.5)
nRBC: 0 % (ref 0.0–0.2)

## 2022-05-15 LAB — SAMPLE TO BLOOD BANK

## 2022-05-15 MED ORDER — EPOETIN ALFA-EPBX 10000 UNIT/ML IJ SOLN
10000.0000 [IU] | Freq: Once | INTRAMUSCULAR | Status: AC
  Administered 2022-05-15: 10000 [IU] via SUBCUTANEOUS
  Filled 2022-05-15: qty 1

## 2022-05-15 MED ORDER — EPOETIN ALFA-EPBX 40000 UNIT/ML IJ SOLN: 30000.0000 [IU] | Freq: Once | INTRAMUSCULAR | Status: DC

## 2022-05-15 MED ORDER — EPOETIN ALFA-EPBX 20000 UNIT/ML IJ SOLN
20000.0000 [IU] | Freq: Once | INTRAMUSCULAR | Status: AC
  Administered 2022-05-15: 20000 [IU] via SUBCUTANEOUS
  Filled 2022-05-15: qty 1

## 2022-05-16 DIAGNOSIS — Z992 Dependence on renal dialysis: Secondary | ICD-10-CM | POA: Diagnosis not present

## 2022-05-16 DIAGNOSIS — E8779 Other fluid overload: Secondary | ICD-10-CM | POA: Diagnosis not present

## 2022-05-16 DIAGNOSIS — N186 End stage renal disease: Secondary | ICD-10-CM | POA: Diagnosis not present

## 2022-05-17 DIAGNOSIS — E8779 Other fluid overload: Secondary | ICD-10-CM | POA: Diagnosis not present

## 2022-05-17 DIAGNOSIS — Z992 Dependence on renal dialysis: Secondary | ICD-10-CM | POA: Diagnosis not present

## 2022-05-17 DIAGNOSIS — N186 End stage renal disease: Secondary | ICD-10-CM | POA: Diagnosis not present

## 2022-05-18 DIAGNOSIS — Z992 Dependence on renal dialysis: Secondary | ICD-10-CM | POA: Diagnosis not present

## 2022-05-18 DIAGNOSIS — N186 End stage renal disease: Secondary | ICD-10-CM | POA: Diagnosis not present

## 2022-05-18 DIAGNOSIS — E8779 Other fluid overload: Secondary | ICD-10-CM | POA: Diagnosis not present

## 2022-05-19 DIAGNOSIS — E8779 Other fluid overload: Secondary | ICD-10-CM | POA: Diagnosis not present

## 2022-05-19 DIAGNOSIS — N186 End stage renal disease: Secondary | ICD-10-CM | POA: Diagnosis not present

## 2022-05-19 DIAGNOSIS — Z992 Dependence on renal dialysis: Secondary | ICD-10-CM | POA: Diagnosis not present

## 2022-05-20 DIAGNOSIS — Z992 Dependence on renal dialysis: Secondary | ICD-10-CM | POA: Diagnosis not present

## 2022-05-20 DIAGNOSIS — Z6833 Body mass index (BMI) 33.0-33.9, adult: Secondary | ICD-10-CM | POA: Diagnosis not present

## 2022-05-20 DIAGNOSIS — N186 End stage renal disease: Secondary | ICD-10-CM | POA: Diagnosis not present

## 2022-05-20 DIAGNOSIS — N946 Dysmenorrhea, unspecified: Secondary | ICD-10-CM | POA: Diagnosis not present

## 2022-05-20 DIAGNOSIS — E8779 Other fluid overload: Secondary | ICD-10-CM | POA: Diagnosis not present

## 2022-05-20 DIAGNOSIS — N921 Excessive and frequent menstruation with irregular cycle: Secondary | ICD-10-CM | POA: Diagnosis not present

## 2022-05-20 DIAGNOSIS — D649 Anemia, unspecified: Secondary | ICD-10-CM | POA: Diagnosis not present

## 2022-05-21 ENCOUNTER — Encounter: Payer: Self-pay | Admitting: Internal Medicine

## 2022-05-21 ENCOUNTER — Other Ambulatory Visit: Payer: Self-pay | Admitting: Medical Oncology

## 2022-05-21 ENCOUNTER — Encounter: Payer: Self-pay | Admitting: Medical Oncology

## 2022-05-21 DIAGNOSIS — N186 End stage renal disease: Secondary | ICD-10-CM | POA: Diagnosis not present

## 2022-05-21 DIAGNOSIS — Z992 Dependence on renal dialysis: Secondary | ICD-10-CM | POA: Diagnosis not present

## 2022-05-21 DIAGNOSIS — E8779 Other fluid overload: Secondary | ICD-10-CM | POA: Diagnosis not present

## 2022-05-21 NOTE — Progress Notes (Signed)
Pt requested lab due to heavy menstrual bleeding again. Labs ordered.

## 2022-05-22 ENCOUNTER — Inpatient Hospital Stay: Payer: BC Managed Care – PPO

## 2022-05-22 ENCOUNTER — Other Ambulatory Visit: Payer: Self-pay | Admitting: Medical Oncology

## 2022-05-22 ENCOUNTER — Other Ambulatory Visit: Payer: Self-pay

## 2022-05-22 ENCOUNTER — Ambulatory Visit: Payer: Medicare Other

## 2022-05-22 ENCOUNTER — Telehealth: Payer: Self-pay | Admitting: Medical Oncology

## 2022-05-22 VITALS — BP 152/91 | HR 81 | Temp 98.4°F | Resp 18

## 2022-05-22 DIAGNOSIS — D500 Iron deficiency anemia secondary to blood loss (chronic): Secondary | ICD-10-CM

## 2022-05-22 DIAGNOSIS — D631 Anemia in chronic kidney disease: Secondary | ICD-10-CM | POA: Diagnosis not present

## 2022-05-22 DIAGNOSIS — D638 Anemia in other chronic diseases classified elsewhere: Secondary | ICD-10-CM

## 2022-05-22 DIAGNOSIS — E8779 Other fluid overload: Secondary | ICD-10-CM | POA: Diagnosis not present

## 2022-05-22 DIAGNOSIS — Z992 Dependence on renal dialysis: Secondary | ICD-10-CM | POA: Diagnosis not present

## 2022-05-22 DIAGNOSIS — D649 Anemia, unspecified: Secondary | ICD-10-CM

## 2022-05-22 DIAGNOSIS — N920 Excessive and frequent menstruation with regular cycle: Secondary | ICD-10-CM | POA: Diagnosis not present

## 2022-05-22 DIAGNOSIS — I120 Hypertensive chronic kidney disease with stage 5 chronic kidney disease or end stage renal disease: Secondary | ICD-10-CM | POA: Diagnosis not present

## 2022-05-22 DIAGNOSIS — N186 End stage renal disease: Secondary | ICD-10-CM | POA: Diagnosis not present

## 2022-05-22 LAB — CBC WITH DIFFERENTIAL (CANCER CENTER ONLY)
Abs Immature Granulocytes: 0.02 10*3/uL (ref 0.00–0.07)
Basophils Absolute: 0 10*3/uL (ref 0.0–0.1)
Basophils Relative: 1 %
Eosinophils Absolute: 0.2 10*3/uL (ref 0.0–0.5)
Eosinophils Relative: 2 %
HCT: 18.8 % — ABNORMAL LOW (ref 36.0–46.0)
Hemoglobin: 6.7 g/dL — CL (ref 12.0–15.0)
Immature Granulocytes: 0 %
Lymphocytes Relative: 13 %
Lymphs Abs: 1 10*3/uL (ref 0.7–4.0)
MCH: 33.7 pg (ref 26.0–34.0)
MCHC: 35.6 g/dL (ref 30.0–36.0)
MCV: 94.5 fL (ref 80.0–100.0)
Monocytes Absolute: 0.6 10*3/uL (ref 0.1–1.0)
Monocytes Relative: 7 %
Neutro Abs: 5.9 10*3/uL (ref 1.7–7.7)
Neutrophils Relative %: 77 %
Platelet Count: 154 10*3/uL (ref 150–400)
RBC: 1.99 MIL/uL — ABNORMAL LOW (ref 3.87–5.11)
RDW: 15.9 % — ABNORMAL HIGH (ref 11.5–15.5)
WBC Count: 7.7 10*3/uL (ref 4.0–10.5)
nRBC: 0 % (ref 0.0–0.2)

## 2022-05-22 LAB — IRON AND IRON BINDING CAPACITY (CC-WL,HP ONLY)
Iron: 29 ug/dL (ref 28–170)
Saturation Ratios: 11 % (ref 10.4–31.8)
TIBC: 267 ug/dL (ref 250–450)
UIBC: 238 ug/dL (ref 148–442)

## 2022-05-22 LAB — FERRITIN: Ferritin: 298 ng/mL (ref 11–307)

## 2022-05-22 LAB — SAMPLE TO BLOOD BANK

## 2022-05-22 MED ORDER — EPOETIN ALFA-EPBX 40000 UNIT/ML IJ SOLN: 30000.0000 [IU] | Freq: Once | INTRAMUSCULAR | Status: DC

## 2022-05-22 MED ORDER — EPOETIN ALFA-EPBX 20000 UNIT/ML IJ SOLN
20000.0000 [IU] | Freq: Once | INTRAMUSCULAR | Status: AC
  Administered 2022-05-22: 20000 [IU] via SUBCUTANEOUS
  Filled 2022-05-22: qty 1

## 2022-05-22 MED ORDER — EPOETIN ALFA-EPBX 10000 UNIT/ML IJ SOLN
10000.0000 [IU] | Freq: Once | INTRAMUSCULAR | Status: AC
  Administered 2022-05-22: 10000 [IU] via SUBCUTANEOUS
  Filled 2022-05-22: qty 1

## 2022-05-22 NOTE — Progress Notes (Signed)
Blood transfusion orders placed. Blood bank notified.

## 2022-05-22 NOTE — Telephone Encounter (Signed)
HGB 6.7- Jacinta notified of low hgb and Mohamed ordered blood transfusion. Per Julien Nordmann I instructed her to contact her GYN. Pt stated she is going to [facility] today for f/u monorrhagia and fibroids

## 2022-05-22 NOTE — Patient Instructions (Signed)

## 2022-05-23 ENCOUNTER — Inpatient Hospital Stay: Payer: BC Managed Care – PPO

## 2022-05-23 DIAGNOSIS — N186 End stage renal disease: Secondary | ICD-10-CM | POA: Diagnosis not present

## 2022-05-23 DIAGNOSIS — N920 Excessive and frequent menstruation with regular cycle: Secondary | ICD-10-CM | POA: Diagnosis not present

## 2022-05-23 DIAGNOSIS — I120 Hypertensive chronic kidney disease with stage 5 chronic kidney disease or end stage renal disease: Secondary | ICD-10-CM | POA: Diagnosis not present

## 2022-05-23 DIAGNOSIS — Z992 Dependence on renal dialysis: Secondary | ICD-10-CM | POA: Diagnosis not present

## 2022-05-23 DIAGNOSIS — E8779 Other fluid overload: Secondary | ICD-10-CM | POA: Diagnosis not present

## 2022-05-23 DIAGNOSIS — D631 Anemia in chronic kidney disease: Secondary | ICD-10-CM | POA: Diagnosis not present

## 2022-05-23 DIAGNOSIS — D649 Anemia, unspecified: Secondary | ICD-10-CM

## 2022-05-23 LAB — PREPARE RBC (CROSSMATCH)

## 2022-05-23 MED ORDER — ACETAMINOPHEN 325 MG PO TABS
650.0000 mg | ORAL_TABLET | Freq: Once | ORAL | Status: AC
  Administered 2022-05-23: 650 mg via ORAL
  Filled 2022-05-23: qty 2

## 2022-05-23 MED ORDER — SODIUM CHLORIDE 0.9% IV SOLUTION
250.0000 mL | Freq: Once | INTRAVENOUS | Status: AC
  Administered 2022-05-23: 250 mL via INTRAVENOUS

## 2022-05-23 MED ORDER — DIPHENHYDRAMINE HCL 25 MG PO CAPS
25.0000 mg | ORAL_CAPSULE | Freq: Once | ORAL | Status: AC
  Administered 2022-05-23: 25 mg via ORAL
  Filled 2022-05-23: qty 1

## 2022-05-23 NOTE — Patient Instructions (Signed)

## 2022-05-24 DIAGNOSIS — Z992 Dependence on renal dialysis: Secondary | ICD-10-CM | POA: Diagnosis not present

## 2022-05-24 DIAGNOSIS — N186 End stage renal disease: Secondary | ICD-10-CM | POA: Diagnosis not present

## 2022-05-24 DIAGNOSIS — E8779 Other fluid overload: Secondary | ICD-10-CM | POA: Diagnosis not present

## 2022-05-24 LAB — TYPE AND SCREEN
ABO/RH(D): O POS
Antibody Screen: NEGATIVE
Unit division: 0
Unit division: 0

## 2022-05-24 LAB — BPAM RBC
Blood Product Expiration Date: 202310272359
Blood Product Expiration Date: 202311062359
ISSUE DATE / TIME: 202309291315
ISSUE DATE / TIME: 202309291315
Unit Type and Rh: 5100
Unit Type and Rh: 5100

## 2022-05-25 DIAGNOSIS — Z992 Dependence on renal dialysis: Secondary | ICD-10-CM | POA: Diagnosis not present

## 2022-05-25 DIAGNOSIS — E8779 Other fluid overload: Secondary | ICD-10-CM | POA: Diagnosis not present

## 2022-05-25 DIAGNOSIS — N186 End stage renal disease: Secondary | ICD-10-CM | POA: Diagnosis not present

## 2022-05-26 ENCOUNTER — Encounter (INDEPENDENT_AMBULATORY_CARE_PROVIDER_SITE_OTHER): Payer: Self-pay

## 2022-05-26 DIAGNOSIS — N186 End stage renal disease: Secondary | ICD-10-CM | POA: Diagnosis not present

## 2022-05-26 DIAGNOSIS — Z992 Dependence on renal dialysis: Secondary | ICD-10-CM | POA: Diagnosis not present

## 2022-05-26 NOTE — Telephone Encounter (Signed)
Patient was offered 05/27/22 but was not able to take appointment. I informed patient that I will have the surgery scheduler reach out to schedule procedure.

## 2022-05-26 NOTE — Telephone Encounter (Signed)
Let's schedule her for a perm cath exchange

## 2022-05-27 DIAGNOSIS — D649 Anemia, unspecified: Secondary | ICD-10-CM | POA: Diagnosis not present

## 2022-05-27 DIAGNOSIS — N921 Excessive and frequent menstruation with irregular cycle: Secondary | ICD-10-CM | POA: Diagnosis not present

## 2022-05-27 DIAGNOSIS — N939 Abnormal uterine and vaginal bleeding, unspecified: Secondary | ICD-10-CM | POA: Diagnosis not present

## 2022-05-27 DIAGNOSIS — Z87891 Personal history of nicotine dependence: Secondary | ICD-10-CM | POA: Diagnosis not present

## 2022-05-27 DIAGNOSIS — Z992 Dependence on renal dialysis: Secondary | ICD-10-CM | POA: Diagnosis not present

## 2022-05-27 DIAGNOSIS — E8779 Other fluid overload: Secondary | ICD-10-CM | POA: Diagnosis not present

## 2022-05-27 DIAGNOSIS — Z3202 Encounter for pregnancy test, result negative: Secondary | ICD-10-CM | POA: Diagnosis not present

## 2022-05-27 DIAGNOSIS — N186 End stage renal disease: Secondary | ICD-10-CM | POA: Diagnosis not present

## 2022-05-28 ENCOUNTER — Telehealth (INDEPENDENT_AMBULATORY_CARE_PROVIDER_SITE_OTHER): Payer: Self-pay

## 2022-05-28 ENCOUNTER — Telehealth: Payer: Self-pay | Admitting: Medical Oncology

## 2022-05-28 ENCOUNTER — Telehealth: Payer: Self-pay | Admitting: Internal Medicine

## 2022-05-28 DIAGNOSIS — N186 End stage renal disease: Secondary | ICD-10-CM | POA: Diagnosis not present

## 2022-05-28 DIAGNOSIS — Z992 Dependence on renal dialysis: Secondary | ICD-10-CM | POA: Diagnosis not present

## 2022-05-28 DIAGNOSIS — E8779 Other fluid overload: Secondary | ICD-10-CM | POA: Diagnosis not present

## 2022-05-28 NOTE — Telephone Encounter (Signed)
Spoke with the patient and per her she was given Activase and it seems to be working so she dose not need to have a Oceanographer.

## 2022-05-28 NOTE — Telephone Encounter (Signed)
Per 10/4 secure chat called and left message for pt about appointment change

## 2022-05-28 NOTE — Telephone Encounter (Signed)
Hysterectomy scheduled for 10/31. Surgeon wants her hgb to be at 9. Would Mohamed consider transfusing me ?  Pt has lab tomorrow. Will wait on CBC result and have Mohamed advise.

## 2022-05-29 ENCOUNTER — Inpatient Hospital Stay: Payer: BC Managed Care – PPO

## 2022-05-29 ENCOUNTER — Inpatient Hospital Stay: Payer: BC Managed Care – PPO | Attending: Internal Medicine

## 2022-05-29 ENCOUNTER — Other Ambulatory Visit: Payer: Self-pay

## 2022-05-29 VITALS — BP 156/94 | HR 94 | Temp 99.1°F | Resp 18

## 2022-05-29 DIAGNOSIS — D638 Anemia in other chronic diseases classified elsewhere: Secondary | ICD-10-CM

## 2022-05-29 DIAGNOSIS — N186 End stage renal disease: Secondary | ICD-10-CM | POA: Insufficient documentation

## 2022-05-29 DIAGNOSIS — D631 Anemia in chronic kidney disease: Secondary | ICD-10-CM | POA: Insufficient documentation

## 2022-05-29 DIAGNOSIS — D649 Anemia, unspecified: Secondary | ICD-10-CM

## 2022-05-29 DIAGNOSIS — D500 Iron deficiency anemia secondary to blood loss (chronic): Secondary | ICD-10-CM

## 2022-05-29 DIAGNOSIS — E8779 Other fluid overload: Secondary | ICD-10-CM | POA: Diagnosis not present

## 2022-05-29 DIAGNOSIS — Z992 Dependence on renal dialysis: Secondary | ICD-10-CM | POA: Diagnosis not present

## 2022-05-29 LAB — CBC WITH DIFFERENTIAL (CANCER CENTER ONLY)
Abs Immature Granulocytes: 0.02 10*3/uL (ref 0.00–0.07)
Basophils Absolute: 0 10*3/uL (ref 0.0–0.1)
Basophils Relative: 0 %
Eosinophils Absolute: 0.3 10*3/uL (ref 0.0–0.5)
Eosinophils Relative: 3 %
HCT: 20.6 % — ABNORMAL LOW (ref 36.0–46.0)
Hemoglobin: 7.1 g/dL — ABNORMAL LOW (ref 12.0–15.0)
Immature Granulocytes: 0 %
Lymphocytes Relative: 11 %
Lymphs Abs: 0.9 10*3/uL (ref 0.7–4.0)
MCH: 30.6 pg (ref 26.0–34.0)
MCHC: 34.5 g/dL (ref 30.0–36.0)
MCV: 88.8 fL (ref 80.0–100.0)
Monocytes Absolute: 0.6 10*3/uL (ref 0.1–1.0)
Monocytes Relative: 7 %
Neutro Abs: 6.4 10*3/uL (ref 1.7–7.7)
Neutrophils Relative %: 79 %
Platelet Count: 180 10*3/uL (ref 150–400)
RBC: 2.32 MIL/uL — ABNORMAL LOW (ref 3.87–5.11)
RDW: 17.8 % — ABNORMAL HIGH (ref 11.5–15.5)
WBC Count: 8.1 10*3/uL (ref 4.0–10.5)
nRBC: 0 % (ref 0.0–0.2)

## 2022-05-29 LAB — SAMPLE TO BLOOD BANK

## 2022-05-29 LAB — PREPARE RBC (CROSSMATCH)

## 2022-05-29 MED ORDER — EPOETIN ALFA-EPBX 20000 UNIT/ML IJ SOLN
20000.0000 [IU] | Freq: Once | INTRAMUSCULAR | Status: AC
  Administered 2022-05-29: 20000 [IU] via SUBCUTANEOUS
  Filled 2022-05-29: qty 1

## 2022-05-29 MED ORDER — EPOETIN ALFA-EPBX 10000 UNIT/ML IJ SOLN
10000.0000 [IU] | Freq: Once | INTRAMUSCULAR | Status: AC
  Administered 2022-05-29: 10000 [IU] via SUBCUTANEOUS
  Filled 2022-05-29: qty 1

## 2022-05-29 MED ORDER — EPOETIN ALFA-EPBX 40000 UNIT/ML IJ SOLN: 30000.0000 [IU] | Freq: Once | INTRAMUSCULAR | Status: DC

## 2022-05-30 ENCOUNTER — Inpatient Hospital Stay: Payer: BC Managed Care – PPO

## 2022-05-30 DIAGNOSIS — D649 Anemia, unspecified: Secondary | ICD-10-CM

## 2022-05-30 DIAGNOSIS — D631 Anemia in chronic kidney disease: Secondary | ICD-10-CM | POA: Diagnosis not present

## 2022-05-30 DIAGNOSIS — Z992 Dependence on renal dialysis: Secondary | ICD-10-CM | POA: Diagnosis not present

## 2022-05-30 DIAGNOSIS — N186 End stage renal disease: Secondary | ICD-10-CM | POA: Diagnosis not present

## 2022-05-30 DIAGNOSIS — D500 Iron deficiency anemia secondary to blood loss (chronic): Secondary | ICD-10-CM

## 2022-05-30 DIAGNOSIS — E8779 Other fluid overload: Secondary | ICD-10-CM | POA: Diagnosis not present

## 2022-05-30 DIAGNOSIS — D638 Anemia in other chronic diseases classified elsewhere: Secondary | ICD-10-CM

## 2022-05-30 MED ORDER — ACETAMINOPHEN 325 MG PO TABS
650.0000 mg | ORAL_TABLET | Freq: Once | ORAL | Status: AC
  Administered 2022-05-30: 650 mg via ORAL
  Filled 2022-05-30: qty 2

## 2022-05-30 MED ORDER — DIPHENHYDRAMINE HCL 25 MG PO CAPS
25.0000 mg | ORAL_CAPSULE | Freq: Once | ORAL | Status: AC
  Administered 2022-05-30: 25 mg via ORAL
  Filled 2022-05-30: qty 1

## 2022-05-30 MED ORDER — SODIUM CHLORIDE 0.9% IV SOLUTION
250.0000 mL | Freq: Once | INTRAVENOUS | Status: AC
  Administered 2022-05-30: 250 mL via INTRAVENOUS

## 2022-05-30 NOTE — Patient Instructions (Signed)

## 2022-05-31 DIAGNOSIS — E8779 Other fluid overload: Secondary | ICD-10-CM | POA: Diagnosis not present

## 2022-05-31 DIAGNOSIS — N186 End stage renal disease: Secondary | ICD-10-CM | POA: Diagnosis not present

## 2022-05-31 DIAGNOSIS — Z992 Dependence on renal dialysis: Secondary | ICD-10-CM | POA: Diagnosis not present

## 2022-06-01 DIAGNOSIS — N186 End stage renal disease: Secondary | ICD-10-CM | POA: Diagnosis not present

## 2022-06-01 DIAGNOSIS — Z992 Dependence on renal dialysis: Secondary | ICD-10-CM | POA: Diagnosis not present

## 2022-06-01 DIAGNOSIS — E8779 Other fluid overload: Secondary | ICD-10-CM | POA: Diagnosis not present

## 2022-06-02 DIAGNOSIS — Z992 Dependence on renal dialysis: Secondary | ICD-10-CM | POA: Diagnosis not present

## 2022-06-02 DIAGNOSIS — N186 End stage renal disease: Secondary | ICD-10-CM | POA: Diagnosis not present

## 2022-06-02 DIAGNOSIS — E8779 Other fluid overload: Secondary | ICD-10-CM | POA: Diagnosis not present

## 2022-06-02 LAB — TYPE AND SCREEN
ABO/RH(D): O POS
Antibody Screen: NEGATIVE
Donor AG Type: NEGATIVE
Donor AG Type: NEGATIVE
Unit division: 0
Unit division: 0

## 2022-06-02 LAB — BPAM RBC
Blood Product Expiration Date: 202311032359
Blood Product Expiration Date: 202311052359
ISSUE DATE / TIME: 202310060830
ISSUE DATE / TIME: 202310060830
Unit Type and Rh: 5100
Unit Type and Rh: 5100

## 2022-06-03 ENCOUNTER — Encounter: Payer: Self-pay | Admitting: Physician Assistant

## 2022-06-03 DIAGNOSIS — N186 End stage renal disease: Secondary | ICD-10-CM | POA: Diagnosis not present

## 2022-06-03 DIAGNOSIS — E8779 Other fluid overload: Secondary | ICD-10-CM | POA: Diagnosis not present

## 2022-06-03 DIAGNOSIS — Z992 Dependence on renal dialysis: Secondary | ICD-10-CM | POA: Diagnosis not present

## 2022-06-04 ENCOUNTER — Telehealth (INDEPENDENT_AMBULATORY_CARE_PROVIDER_SITE_OTHER): Payer: Self-pay

## 2022-06-04 DIAGNOSIS — E8779 Other fluid overload: Secondary | ICD-10-CM | POA: Diagnosis not present

## 2022-06-04 DIAGNOSIS — N186 End stage renal disease: Secondary | ICD-10-CM | POA: Diagnosis not present

## 2022-06-04 DIAGNOSIS — Z992 Dependence on renal dialysis: Secondary | ICD-10-CM | POA: Diagnosis not present

## 2022-06-04 NOTE — Telephone Encounter (Signed)
Let's get her in for a permcath exchange

## 2022-06-04 NOTE — Telephone Encounter (Signed)
Patient is using permacath

## 2022-06-04 NOTE — Telephone Encounter (Signed)
Is she using her permcath or her fistula ?

## 2022-06-05 ENCOUNTER — Telehealth (INDEPENDENT_AMBULATORY_CARE_PROVIDER_SITE_OTHER): Payer: Self-pay

## 2022-06-05 ENCOUNTER — Inpatient Hospital Stay: Payer: BC Managed Care – PPO

## 2022-06-05 ENCOUNTER — Inpatient Hospital Stay: Payer: BC Managed Care – PPO | Admitting: Internal Medicine

## 2022-06-05 DIAGNOSIS — N186 End stage renal disease: Secondary | ICD-10-CM | POA: Diagnosis not present

## 2022-06-05 DIAGNOSIS — Z992 Dependence on renal dialysis: Secondary | ICD-10-CM | POA: Diagnosis not present

## 2022-06-05 DIAGNOSIS — E8779 Other fluid overload: Secondary | ICD-10-CM | POA: Diagnosis not present

## 2022-06-05 NOTE — Telephone Encounter (Signed)
I attempted to contact the patient to schedule her for a permcath exchange. The patient's number is her home and mobile as well and the voicemail box is full. I was unable to leave a message. I then contacted her significant other and spoke with him to have the patient to return my call.

## 2022-06-06 ENCOUNTER — Telehealth (INDEPENDENT_AMBULATORY_CARE_PROVIDER_SITE_OTHER): Payer: Self-pay

## 2022-06-06 DIAGNOSIS — E8779 Other fluid overload: Secondary | ICD-10-CM | POA: Diagnosis not present

## 2022-06-06 DIAGNOSIS — N186 End stage renal disease: Secondary | ICD-10-CM | POA: Diagnosis not present

## 2022-06-06 DIAGNOSIS — Z992 Dependence on renal dialysis: Secondary | ICD-10-CM | POA: Diagnosis not present

## 2022-06-06 NOTE — Telephone Encounter (Signed)
Pt LVM and wants you to return her call @ [phone]

## 2022-06-06 NOTE — Telephone Encounter (Signed)
Spoke with the patient and she is scheduled with Dr. Lucky Cowboy on 06/09/22 with a 7:45 am arrival time to the MM. Pre-procedure instructions were discussed and patient stated she understood.

## 2022-06-08 DIAGNOSIS — E8779 Other fluid overload: Secondary | ICD-10-CM | POA: Diagnosis not present

## 2022-06-08 DIAGNOSIS — N186 End stage renal disease: Secondary | ICD-10-CM | POA: Diagnosis not present

## 2022-06-08 DIAGNOSIS — Z992 Dependence on renal dialysis: Secondary | ICD-10-CM | POA: Diagnosis not present

## 2022-06-09 ENCOUNTER — Encounter: Payer: Self-pay | Admitting: Vascular Surgery

## 2022-06-09 ENCOUNTER — Ambulatory Visit
Admission: RE | Admit: 2022-06-09 | Discharge: 2022-06-09 | Disposition: A | Discharge status: Home / Self Care | Payer: BC Managed Care – PPO | Attending: Vascular Surgery | Admitting: Vascular Surgery

## 2022-06-09 ENCOUNTER — Other Ambulatory Visit: Payer: Self-pay

## 2022-06-09 ENCOUNTER — Encounter
Admission: RE | Disposition: A | Discharge status: Home / Self Care | Payer: Self-pay | Source: Home / Self Care | Attending: Vascular Surgery

## 2022-06-09 DIAGNOSIS — N186 End stage renal disease: Secondary | ICD-10-CM | POA: Diagnosis not present

## 2022-06-09 DIAGNOSIS — T8249XA Other complication of vascular dialysis catheter, initial encounter: Secondary | ICD-10-CM | POA: Insufficient documentation

## 2022-06-09 DIAGNOSIS — Z87891 Personal history of nicotine dependence: Secondary | ICD-10-CM | POA: Insufficient documentation

## 2022-06-09 DIAGNOSIS — Z992 Dependence on renal dialysis: Secondary | ICD-10-CM | POA: Diagnosis not present

## 2022-06-09 DIAGNOSIS — I120 Hypertensive chronic kidney disease with stage 5 chronic kidney disease or end stage renal disease: Secondary | ICD-10-CM | POA: Insufficient documentation

## 2022-06-09 DIAGNOSIS — Y841 Kidney dialysis as the cause of abnormal reaction of the patient, or of later complication, without mention of misadventure at the time of the procedure: Secondary | ICD-10-CM | POA: Insufficient documentation

## 2022-06-09 DIAGNOSIS — E8779 Other fluid overload: Secondary | ICD-10-CM | POA: Diagnosis not present

## 2022-06-09 HISTORY — PX: DIALYSIS/PERMA CATHETER INSERTION: CATH118288

## 2022-06-09 LAB — POTASSIUM (ARMC VASCULAR LAB ONLY): Potassium (ARMC vascular lab): 5.2 mmol/L — ABNORMAL HIGH (ref 3.5–5.1)

## 2022-06-09 SURGERY — DIALYSIS/PERMA CATHETER INSERTION
Anesthesia: Moderate Sedation

## 2022-06-09 MED ORDER — SODIUM CHLORIDE 0.9 % IV SOLN: INTRAVENOUS | Status: DC

## 2022-06-09 MED ORDER — HEPARIN SODIUM (PORCINE) 10000 UNIT/ML IJ SOLN
INTRAMUSCULAR | Status: AC
  Filled 2022-06-09: qty 1

## 2022-06-09 MED ORDER — CEFAZOLIN SODIUM-DEXTROSE 1-4 GM/50ML-% IV SOLN
INTRAVENOUS | Status: AC
  Administered 2022-06-09: 1 g via INTRAVENOUS
  Filled 2022-06-09: qty 50

## 2022-06-09 MED ORDER — METHYLPREDNISOLONE SODIUM SUCC 125 MG IJ SOLR: 125.0000 mg | Freq: Once | INTRAMUSCULAR | Status: DC | PRN

## 2022-06-09 MED ORDER — HYDROMORPHONE HCL 1 MG/ML IJ SOLN: 1.0000 mg | Freq: Once | INTRAMUSCULAR | Status: DC | PRN

## 2022-06-09 MED ORDER — CEFAZOLIN SODIUM-DEXTROSE 1-4 GM/50ML-% IV SOLN: 1.0000 g | INTRAVENOUS | Status: AC

## 2022-06-09 MED ORDER — FAMOTIDINE 20 MG PO TABS: 40.0000 mg | ORAL_TABLET | Freq: Once | ORAL | Status: DC | PRN

## 2022-06-09 MED ORDER — MIDAZOLAM HCL 2 MG/2ML IJ SOLN
INTRAMUSCULAR | Status: DC | PRN
  Administered 2022-06-09: 2 mg via INTRAVENOUS

## 2022-06-09 MED ORDER — DIPHENHYDRAMINE HCL 50 MG/ML IJ SOLN: 50.0000 mg | Freq: Once | INTRAMUSCULAR | Status: DC | PRN

## 2022-06-09 MED ORDER — ONDANSETRON HCL 4 MG/2ML IJ SOLN: 4.0000 mg | Freq: Four times a day (QID) | INTRAMUSCULAR | Status: DC | PRN

## 2022-06-09 MED ORDER — MIDAZOLAM HCL 2 MG/2ML IJ SOLN
INTRAMUSCULAR | Status: AC
  Filled 2022-06-09: qty 2

## 2022-06-09 MED ORDER — MIDAZOLAM HCL 2 MG/ML PO SYRP: 8.0000 mg | ORAL_SOLUTION | Freq: Once | ORAL | Status: DC | PRN

## 2022-06-09 SURGICAL SUPPLY — 7 items
BIOPATCH RED 1 DISK 7.0 (GAUZE/BANDAGES/DRESSINGS) IMPLANT
CATH PALINDROME-P 19CM W/VT (CATHETERS) IMPLANT
GUIDEWIRE SUPER STIFF .035X180 (WIRE) IMPLANT
PACK ANGIOGRAPHY (CUSTOM PROCEDURE TRAY) IMPLANT
SUT MNCRL AB 4-0 PS2 18 (SUTURE) IMPLANT
SUT PROLENE 0 CT 1 30 (SUTURE) IMPLANT
TOWEL OR 17X26 4PK STRL BLUE (TOWEL DISPOSABLE) IMPLANT

## 2022-06-09 NOTE — H&P (Signed)
[facility] Admission History & Physical  MRN : [id number]  Holly [name] is a 36 y.o. (1986/07/14) female who presents with chief complaint of No chief complaint on file. Marland Kitchen  History of Present Illness: Patient presents with a poorly functioning PermCath with her end-stage renal disease.  This needs to be exchanged.  No fevers or chills.  Current Facility-Administered Medications  Medication Dose Route Frequency Provider Last Rate Last Admin   0.9 %  sodium chloride infusion   Intravenous Continuous Kris Hartmann, NP       ceFAZolin (ANCEF) IVPB 1 g/50 mL premix  1 g Intravenous 30 min Pre-Op Kris Hartmann, NP       diphenhydrAMINE (BENADRYL) injection 50 mg  50 mg Intravenous Once PRN Kris Hartmann, NP       famotidine (PEPCID) tablet 40 mg  40 mg Oral Once PRN Kris Hartmann, NP       HYDROmorphone (DILAUDID) injection 1 mg  1 mg Intravenous Once PRN Kris Hartmann, NP       methylPREDNISolone sodium succinate (SOLU-MEDROL) 125 mg/2 mL injection 125 mg  125 mg Intravenous Once PRN Kris Hartmann, NP       midazolam (VERSED) 2 MG/ML syrup 8 mg  8 mg Oral Once PRN Kris Hartmann, NP       ondansetron [facility]) injection 4 mg  4 mg Intravenous Q6H PRN Kris Hartmann, NP        Past Medical History:  Diagnosis Date   Anemia    Anxiety    Arthritis    hands   Blood transfusion without reported diagnosis    Cancer ([place])    Complication of anesthesia    with Fentanyl she is difficult to awake   Depression    Dialysis patient ([place])    Dyspnea    due to anemia   Epilepsy ([place])    Hashimoto's disease    History of kidney stones    HLD (hyperlipidemia)    Hypertension    Hypothyroidism    Pneumonia    Renal disorder    Thyroid disease     Past Surgical History:  Procedure Laterality Date   ABDOMINAL SURGERY     AV FISTULA PLACEMENT Left 01/17/2022   Procedure: ARTERIOVENOUS (AV) FISTULA CREATION ( RADIAL CEPHALIC);  Surgeon: Katha Cabal, MD;  Location: [facility] ORS;  Service: Vascular;  Laterality: Left;   CERVICAL BIOPSY  W/ LOOP ELECTRODE EXCISION  2018   COLONOSCOPY N/A 01/30/2022   Procedure: COLONOSCOPY;  Surgeon: Annamaria Helling, DO;  Location: [facility] ENDOSCOPY;  Service: Gastroenterology;  Laterality: N/A;   COLONOSCOPY WITH PROPOFOL N/A 02/01/2022   Procedure: COLONOSCOPY WITH PROPOFOL;  Surgeon: Lesly Rubenstein, MD;  Location: [facility] ENDOSCOPY;  Service: Endoscopy;  Laterality: N/A;   DIAGNOSTIC LAPAROSCOPY     due to removal of PD catheters   ESOPHAGOGASTRODUODENOSCOPY N/A 01/30/2022   Procedure: ESOPHAGOGASTRODUODENOSCOPY (EGD);  Surgeon: Annamaria Helling, DO;  Location: [facility] ENDOSCOPY;  Service: Gastroenterology;  Laterality: N/A;   ESOPHAGOGASTRODUODENOSCOPY (EGD) WITH PROPOFOL N/A 02/01/2022   Procedure: ESOPHAGOGASTRODUODENOSCOPY (EGD) WITH PROPOFOL;  Surgeon: Lesly Rubenstein, MD;  Location: [facility] ENDOSCOPY;  Service: Endoscopy;  Laterality: N/A;   RENAL BIOPSY     x 2   TUMOR REMOVAL     right face     Social History   Tobacco Use   Smoking status: Former    Types: Cigarettes   Smokeless tobacco: Never  Vaping Use   Vaping Use: Never used  Substance Use Topics   Alcohol use: Never   Drug use: Never     Family History  Problem Relation Age of Onset   Multiple sclerosis Mother    Cancer Father    COPD Father     Allergies  Allergen Reactions   Rituximab Anaphylaxis and Swelling     REVIEW OF SYSTEMS (Negative unless checked)  Constitutional: '[]'$ Weight loss  '[]'$ Fever  '[]'$ Chills Cardiac: '[]'$ Chest pain   '[]'$ Chest pressure   '[]'$ Palpitations   '[]'$ Shortness of breath when laying flat   '[]'$ Shortness of breath at rest   '[]'$ Shortness of breath with exertion. Vascular:  '[]'$ Pain in legs with walking   '[]'$ Pain in legs at rest   '[]'$ Pain in legs when laying flat   '[]'$ Claudication   '[]'$ Pain in feet when walking  '[]'$ Pain in feet at rest  '[]'$ Pain in feet when laying flat   '[]'$ History of DVT   '[]'$ Phlebitis    '[]'$ Swelling in legs   '[]'$ Varicose veins   '[]'$ Non-healing ulcers Pulmonary:   '[]'$ Uses home oxygen   '[]'$ Productive cough   '[]'$ Hemoptysis   '[]'$ Wheeze  '[]'$ COPD   '[]'$ Asthma Neurologic:  '[]'$ Dizziness  '[]'$ Blackouts   '[x]'$ Seizures   '[]'$ History of stroke   '[]'$ History of TIA  '[]'$ Aphasia   '[]'$ Temporary blindness   '[]'$ Dysphagia   '[]'$ Weakness or numbness in arms   '[]'$ Weakness or numbness in legs Musculoskeletal:  '[x]'$ Arthritis   '[]'$ Joint swelling   '[]'$ Joint pain   '[]'$ Low back pain Hematologic:  '[]'$ Easy bruising  '[]'$ Easy bleeding   '[]'$ Hypercoagulable state   '[x]'$ Anemic  '[]'$ Hepatitis Gastrointestinal:  '[]'$ Blood in stool   '[]'$ Vomiting blood  '[]'$ Gastroesophageal reflux/heartburn   '[]'$ Difficulty swallowing. Genitourinary:  '[x]'$ Chronic kidney disease   '[]'$ Difficult urination  '[]'$ Frequent urination  '[]'$ Burning with urination   '[]'$ Blood in urine Skin:  '[]'$ Rashes   '[]'$ Ulcers   '[]'$ Wounds Psychological:  '[x]'$ History of anxiety   '[]'$  History of major depression.  Physical Examination  There were no vitals filed for this visit. There is no height or weight on file to calculate BMI. Gen: WD/WN, NAD. Appears older than stated age. Head: Flushing/AT, No temporalis wasting.  Ear/Nose/Throat: Hearing grossly intact, nares w/o erythema or drainage, oropharynx w/o Erythema/Exudate,  Eyes: Conjunctiva clear, sclera non-icteric Neck: Trachea midline.  No JVD.  Pulmonary:  Good air movement, respirations not labored, no use of accessory muscles.  Cardiac: RRR, normal S1, S2. Vascular:  Vessel Right Left  Radial Palpable Palpable           Musculoskeletal: M/S 5/5 throughout.  Extremities without ischemic changes.  No deformity or atrophy.  Neurologic: Sensation grossly intact in extremities.  Symmetrical.  Speech is fluent. Motor exam as listed above. Psychiatric: Judgment intact, Mood & affect appropriate for pt's clinical situation. Dermatologic: No rashes or ulcers noted.  No cellulitis or open wounds.      CBC Lab Results  Component Value Date   WBC 8.1  05/29/2022   HGB 7.1 (L) 05/29/2022   HCT 20.6 (L) 05/29/2022   MCV 88.8 05/29/2022   PLT 180 05/29/2022    BMET    Component Value Date/Time   NA 137 04/11/2022 1220   K 5.1 04/11/2022 1220   CL 100 04/11/2022 1220   CO2 26 04/11/2022 1220   GLUCOSE 96 04/11/2022 1220   BUN 31 (H) 04/11/2022 1220   CREATININE 8.76 (H) 04/11/2022 1220   CREATININE 9.97 (HH) 03/31/2022 1310   CALCIUM 10.0 04/11/2022 1220   GFRNONAA 6 (L) 04/11/2022  1220   GFRNONAA 5 (L) 03/31/2022 1310   GFRAA 3 (L) 02/19/2020 2302   CrCl cannot be calculated (Patient's most recent lab result is older than the maximum 21 days allowed.).  COAG Lab Results  Component Value Date   INR 1.1 02/18/2022    Radiology No results found.   Assessment/Plan 1. ESRD.  Needs permcath exchange today.  Risks and benefits are discussed 2.  Hypertension.  Stable on outpatient medications and blood pressure control important in reducing the progression of atherosclerotic disease. On appropriate oral medications.    Leotis Pain, MD  06/09/2022 8:09 AM

## 2022-06-09 NOTE — Op Note (Signed)
OPERATIVE NOTE    PRE-OPERATIVE DIAGNOSIS: 1. ESRD 2. Non-functional permcath  POST-OPERATIVE DIAGNOSIS: same as above  PROCEDURE: Fluoroscopic guidance for placement of catheter Placement of a 19 cm tip to cuff tunneled hemodialysis catheter via the right internal jugular vein and removal of previous catheter  SURGEON: Leotis Pain, MD  ANESTHESIA:  Local with moderate conscious sedation for 20 minutes using 2 mg of Versed   ESTIMATED BLOOD LOSS: 3 cc  FINDING(S): none  SPECIMEN(S):  None  INDICATIONS:   Patient is a 36 y.o.female who presents with non-functional dialysis catheter and ESRD.  The patient needs long term dialysis access for their ESRD, and a Permcath is necessary.  Risks and benefits are discussed and informed consent is obtained.    DESCRIPTION: After obtaining full informed written consent, the patient was brought back to the vascular suite. The patient received moderate conscious sedation during a face-to-face encounter with me present throughout the entire procedure and supervising the RN monitoring the vital signs, pulse oximetry, telemetry, and mental status throughout the entire procedure. The patient's existing catheter, right neck and chest were sterilely prepped and draped in a sterile surgical field was created.  The existing catheter was dissected free from the fibrous sheath securing the cuff with hemostats and blunt dissection.  A wire was placed. The existing catheter was then removed and the wire used to keep venous access. I selected a 19 cm tip to cuff tunneled dialysis catheter.  Using fluoroscopic guidance the catheter tips were parked in the right atrium. The appropriate distal connectors were placed. It withdrew blood well and flushed easily with heparinized saline and a concentrated heparin solution was then placed. It was secured to the chest wall with 2 Prolene sutures. A 4-0 Monocryl pursestring suture was placed around the exit site. Sterile  dressings were placed. The patient tolerated the procedure well and was taken to the recovery room in stable condition.  COMPLICATIONS: None  CONDITION: Stable  Leotis Pain 06/09/2022 9:35 AM   This note was created with Dragon Medical transcription system. Any errors in dictation are purely unintentional.

## 2022-06-10 ENCOUNTER — Other Ambulatory Visit: Payer: Self-pay

## 2022-06-10 ENCOUNTER — Telehealth: Payer: Self-pay

## 2022-06-10 DIAGNOSIS — E8779 Other fluid overload: Secondary | ICD-10-CM | POA: Diagnosis not present

## 2022-06-10 DIAGNOSIS — N186 End stage renal disease: Secondary | ICD-10-CM | POA: Diagnosis not present

## 2022-06-10 DIAGNOSIS — Z992 Dependence on renal dialysis: Secondary | ICD-10-CM | POA: Diagnosis not present

## 2022-06-10 NOTE — Telephone Encounter (Signed)
This nurse received a call from [place] with [facility] who states that he is trying to get this patient set up to receive her weekly injections at the Dialysis center versus the patient making a separate trip to the infusion center.  He states that he needs a clarification due to them being under the impression that the patient was getting Aranesp but the letter received from the doctor states Retacrit.  He also request to know dosage and frequency.  This nurse advised of the patients treatment plan order of Retacrit and the last dose was 30,000 units weekly.  He acknowledged understanding and no further questions noted at this time.  States that he will call back to this clinic if there are any other questions or concerns.

## 2022-06-11 DIAGNOSIS — Z992 Dependence on renal dialysis: Secondary | ICD-10-CM | POA: Diagnosis not present

## 2022-06-11 DIAGNOSIS — N186 End stage renal disease: Secondary | ICD-10-CM | POA: Diagnosis not present

## 2022-06-11 DIAGNOSIS — E8779 Other fluid overload: Secondary | ICD-10-CM | POA: Diagnosis not present

## 2022-06-12 ENCOUNTER — Telehealth (INDEPENDENT_AMBULATORY_CARE_PROVIDER_SITE_OTHER): Payer: Self-pay

## 2022-06-12 DIAGNOSIS — E8779 Other fluid overload: Secondary | ICD-10-CM | POA: Diagnosis not present

## 2022-06-12 DIAGNOSIS — Z992 Dependence on renal dialysis: Secondary | ICD-10-CM | POA: Diagnosis not present

## 2022-06-12 DIAGNOSIS — N186 End stage renal disease: Secondary | ICD-10-CM | POA: Diagnosis not present

## 2022-06-12 NOTE — Telephone Encounter (Signed)
Patient was made aware with medical recommendations and verbalized understanding 

## 2022-06-13 DIAGNOSIS — E8779 Other fluid overload: Secondary | ICD-10-CM | POA: Diagnosis not present

## 2022-06-13 DIAGNOSIS — N186 End stage renal disease: Secondary | ICD-10-CM | POA: Diagnosis not present

## 2022-06-13 DIAGNOSIS — Z992 Dependence on renal dialysis: Secondary | ICD-10-CM | POA: Diagnosis not present

## 2022-06-14 DIAGNOSIS — E8779 Other fluid overload: Secondary | ICD-10-CM | POA: Diagnosis not present

## 2022-06-14 DIAGNOSIS — N186 End stage renal disease: Secondary | ICD-10-CM | POA: Diagnosis not present

## 2022-06-14 DIAGNOSIS — Z992 Dependence on renal dialysis: Secondary | ICD-10-CM | POA: Diagnosis not present

## 2022-06-15 DIAGNOSIS — N186 End stage renal disease: Secondary | ICD-10-CM | POA: Diagnosis not present

## 2022-06-15 DIAGNOSIS — E8779 Other fluid overload: Secondary | ICD-10-CM | POA: Diagnosis not present

## 2022-06-15 DIAGNOSIS — Z992 Dependence on renal dialysis: Secondary | ICD-10-CM | POA: Diagnosis not present

## 2022-06-16 DIAGNOSIS — E8779 Other fluid overload: Secondary | ICD-10-CM | POA: Diagnosis not present

## 2022-06-16 DIAGNOSIS — N186 End stage renal disease: Secondary | ICD-10-CM | POA: Diagnosis not present

## 2022-06-16 DIAGNOSIS — Z992 Dependence on renal dialysis: Secondary | ICD-10-CM | POA: Diagnosis not present

## 2022-06-17 DIAGNOSIS — E8779 Other fluid overload: Secondary | ICD-10-CM | POA: Diagnosis not present

## 2022-06-17 DIAGNOSIS — Z992 Dependence on renal dialysis: Secondary | ICD-10-CM | POA: Diagnosis not present

## 2022-06-17 DIAGNOSIS — N186 End stage renal disease: Secondary | ICD-10-CM | POA: Diagnosis not present

## 2022-06-18 DIAGNOSIS — E8779 Other fluid overload: Secondary | ICD-10-CM | POA: Diagnosis not present

## 2022-06-18 DIAGNOSIS — N186 End stage renal disease: Secondary | ICD-10-CM | POA: Diagnosis not present

## 2022-06-18 DIAGNOSIS — Z992 Dependence on renal dialysis: Secondary | ICD-10-CM | POA: Diagnosis not present

## 2022-06-19 DIAGNOSIS — E8779 Other fluid overload: Secondary | ICD-10-CM | POA: Diagnosis not present

## 2022-06-19 DIAGNOSIS — I10 Essential (primary) hypertension: Secondary | ICD-10-CM | POA: Diagnosis not present

## 2022-06-19 DIAGNOSIS — J45909 Unspecified asthma, uncomplicated: Secondary | ICD-10-CM | POA: Diagnosis not present

## 2022-06-19 DIAGNOSIS — E039 Hypothyroidism, unspecified: Secondary | ICD-10-CM | POA: Diagnosis not present

## 2022-06-19 DIAGNOSIS — N186 End stage renal disease: Secondary | ICD-10-CM | POA: Diagnosis not present

## 2022-06-19 DIAGNOSIS — Z992 Dependence on renal dialysis: Secondary | ICD-10-CM | POA: Diagnosis not present

## 2022-06-20 DIAGNOSIS — E8779 Other fluid overload: Secondary | ICD-10-CM | POA: Diagnosis not present

## 2022-06-20 DIAGNOSIS — N186 End stage renal disease: Secondary | ICD-10-CM | POA: Diagnosis not present

## 2022-06-20 DIAGNOSIS — Z992 Dependence on renal dialysis: Secondary | ICD-10-CM | POA: Diagnosis not present

## 2022-06-21 DIAGNOSIS — Z992 Dependence on renal dialysis: Secondary | ICD-10-CM | POA: Diagnosis not present

## 2022-06-21 DIAGNOSIS — E8779 Other fluid overload: Secondary | ICD-10-CM | POA: Diagnosis not present

## 2022-06-21 DIAGNOSIS — N186 End stage renal disease: Secondary | ICD-10-CM | POA: Diagnosis not present

## 2022-06-22 DIAGNOSIS — N186 End stage renal disease: Secondary | ICD-10-CM | POA: Diagnosis not present

## 2022-06-22 DIAGNOSIS — E8779 Other fluid overload: Secondary | ICD-10-CM | POA: Diagnosis not present

## 2022-06-22 DIAGNOSIS — Z992 Dependence on renal dialysis: Secondary | ICD-10-CM | POA: Diagnosis not present

## 2022-06-23 ENCOUNTER — Other Ambulatory Visit (INDEPENDENT_AMBULATORY_CARE_PROVIDER_SITE_OTHER): Payer: Self-pay | Admitting: Nurse Practitioner

## 2022-06-23 ENCOUNTER — Encounter (INDEPENDENT_AMBULATORY_CARE_PROVIDER_SITE_OTHER): Payer: Self-pay

## 2022-06-23 DIAGNOSIS — E8779 Other fluid overload: Secondary | ICD-10-CM | POA: Diagnosis not present

## 2022-06-23 DIAGNOSIS — Z992 Dependence on renal dialysis: Secondary | ICD-10-CM | POA: Diagnosis not present

## 2022-06-23 DIAGNOSIS — N186 End stage renal disease: Secondary | ICD-10-CM

## 2022-06-24 DIAGNOSIS — E875 Hyperkalemia: Secondary | ICD-10-CM | POA: Diagnosis not present

## 2022-06-24 DIAGNOSIS — N838 Other noninflammatory disorders of ovary, fallopian tube and broad ligament: Secondary | ICD-10-CM | POA: Diagnosis not present

## 2022-06-24 DIAGNOSIS — D500 Iron deficiency anemia secondary to blood loss (chronic): Secondary | ICD-10-CM | POA: Diagnosis not present

## 2022-06-24 DIAGNOSIS — N186 End stage renal disease: Secondary | ICD-10-CM | POA: Diagnosis not present

## 2022-06-24 DIAGNOSIS — Z992 Dependence on renal dialysis: Secondary | ICD-10-CM | POA: Diagnosis not present

## 2022-06-24 DIAGNOSIS — D62 Acute posthemorrhagic anemia: Secondary | ICD-10-CM | POA: Diagnosis not present

## 2022-06-24 DIAGNOSIS — N921 Excessive and frequent menstruation with irregular cycle: Secondary | ICD-10-CM | POA: Diagnosis not present

## 2022-06-24 DIAGNOSIS — E039 Hypothyroidism, unspecified: Secondary | ICD-10-CM | POA: Diagnosis not present

## 2022-06-24 DIAGNOSIS — N2581 Secondary hyperparathyroidism of renal origin: Secondary | ICD-10-CM | POA: Diagnosis not present

## 2022-06-24 DIAGNOSIS — E871 Hypo-osmolality and hyponatremia: Secondary | ICD-10-CM | POA: Diagnosis not present

## 2022-06-24 DIAGNOSIS — E8770 Fluid overload, unspecified: Secondary | ICD-10-CM | POA: Diagnosis not present

## 2022-06-24 DIAGNOSIS — N939 Abnormal uterine and vaginal bleeding, unspecified: Secondary | ICD-10-CM | POA: Diagnosis not present

## 2022-06-24 DIAGNOSIS — D631 Anemia in chronic kidney disease: Secondary | ICD-10-CM | POA: Diagnosis not present

## 2022-06-24 DIAGNOSIS — G8918 Other acute postprocedural pain: Secondary | ICD-10-CM | POA: Diagnosis not present

## 2022-06-24 DIAGNOSIS — D219 Benign neoplasm of connective and other soft tissue, unspecified: Secondary | ICD-10-CM | POA: Diagnosis not present

## 2022-06-24 DIAGNOSIS — I10 Essential (primary) hypertension: Secondary | ICD-10-CM | POA: Diagnosis not present

## 2022-06-24 DIAGNOSIS — N736 Female pelvic peritoneal adhesions (postinfective): Secondary | ICD-10-CM | POA: Diagnosis not present

## 2022-06-24 DIAGNOSIS — D259 Leiomyoma of uterus, unspecified: Secondary | ICD-10-CM | POA: Diagnosis not present

## 2022-06-24 DIAGNOSIS — E8779 Other fluid overload: Secondary | ICD-10-CM | POA: Diagnosis not present

## 2022-06-24 DIAGNOSIS — N055 Unspecified nephritic syndrome with diffuse mesangiocapillary glomerulonephritis: Secondary | ICD-10-CM | POA: Diagnosis not present

## 2022-06-25 DIAGNOSIS — N2581 Secondary hyperparathyroidism of renal origin: Secondary | ICD-10-CM | POA: Diagnosis not present

## 2022-06-25 DIAGNOSIS — E8770 Fluid overload, unspecified: Secondary | ICD-10-CM | POA: Diagnosis not present

## 2022-06-25 DIAGNOSIS — N838 Other noninflammatory disorders of ovary, fallopian tube and broad ligament: Secondary | ICD-10-CM | POA: Diagnosis not present

## 2022-06-25 DIAGNOSIS — D62 Acute posthemorrhagic anemia: Secondary | ICD-10-CM | POA: Diagnosis not present

## 2022-06-25 DIAGNOSIS — N186 End stage renal disease: Secondary | ICD-10-CM | POA: Diagnosis not present

## 2022-06-25 DIAGNOSIS — N736 Female pelvic peritoneal adhesions (postinfective): Secondary | ICD-10-CM | POA: Diagnosis not present

## 2022-06-25 DIAGNOSIS — E875 Hyperkalemia: Secondary | ICD-10-CM | POA: Diagnosis not present

## 2022-06-25 DIAGNOSIS — N921 Excessive and frequent menstruation with irregular cycle: Secondary | ICD-10-CM | POA: Diagnosis not present

## 2022-06-25 DIAGNOSIS — G8918 Other acute postprocedural pain: Secondary | ICD-10-CM | POA: Diagnosis not present

## 2022-06-25 DIAGNOSIS — I10 Essential (primary) hypertension: Secondary | ICD-10-CM | POA: Diagnosis not present

## 2022-06-25 DIAGNOSIS — Z992 Dependence on renal dialysis: Secondary | ICD-10-CM | POA: Diagnosis not present

## 2022-06-25 DIAGNOSIS — N055 Unspecified nephritic syndrome with diffuse mesangiocapillary glomerulonephritis: Secondary | ICD-10-CM | POA: Diagnosis not present

## 2022-06-25 DIAGNOSIS — D259 Leiomyoma of uterus, unspecified: Secondary | ICD-10-CM | POA: Diagnosis not present

## 2022-06-25 DIAGNOSIS — E871 Hypo-osmolality and hyponatremia: Secondary | ICD-10-CM | POA: Diagnosis not present

## 2022-06-25 DIAGNOSIS — D631 Anemia in chronic kidney disease: Secondary | ICD-10-CM | POA: Diagnosis not present

## 2022-06-26 ENCOUNTER — Encounter (INDEPENDENT_AMBULATORY_CARE_PROVIDER_SITE_OTHER): Payer: BC Managed Care – PPO

## 2022-06-26 ENCOUNTER — Ambulatory Visit (INDEPENDENT_AMBULATORY_CARE_PROVIDER_SITE_OTHER): Payer: BC Managed Care – PPO | Admitting: Nurse Practitioner

## 2022-06-26 DIAGNOSIS — N186 End stage renal disease: Secondary | ICD-10-CM | POA: Diagnosis not present

## 2022-06-26 DIAGNOSIS — Z992 Dependence on renal dialysis: Secondary | ICD-10-CM | POA: Diagnosis not present

## 2022-06-26 DIAGNOSIS — E8779 Other fluid overload: Secondary | ICD-10-CM | POA: Diagnosis not present

## 2022-06-27 DIAGNOSIS — Z992 Dependence on renal dialysis: Secondary | ICD-10-CM | POA: Diagnosis not present

## 2022-06-27 DIAGNOSIS — N186 End stage renal disease: Secondary | ICD-10-CM | POA: Diagnosis not present

## 2022-06-27 DIAGNOSIS — E8779 Other fluid overload: Secondary | ICD-10-CM | POA: Diagnosis not present

## 2022-06-28 DIAGNOSIS — N186 End stage renal disease: Secondary | ICD-10-CM | POA: Diagnosis not present

## 2022-06-28 DIAGNOSIS — E8779 Other fluid overload: Secondary | ICD-10-CM | POA: Diagnosis not present

## 2022-06-28 DIAGNOSIS — Z992 Dependence on renal dialysis: Secondary | ICD-10-CM | POA: Diagnosis not present

## 2022-06-29 DIAGNOSIS — Z992 Dependence on renal dialysis: Secondary | ICD-10-CM | POA: Diagnosis not present

## 2022-06-29 DIAGNOSIS — E8779 Other fluid overload: Secondary | ICD-10-CM | POA: Diagnosis not present

## 2022-06-29 DIAGNOSIS — N186 End stage renal disease: Secondary | ICD-10-CM | POA: Diagnosis not present

## 2022-06-30 ENCOUNTER — Emergency Department (HOSPITAL_COMMUNITY): Payer: BC Managed Care – PPO

## 2022-06-30 ENCOUNTER — Other Ambulatory Visit: Payer: Self-pay

## 2022-06-30 ENCOUNTER — Encounter (HOSPITAL_COMMUNITY): Payer: Self-pay | Admitting: Emergency Medicine

## 2022-06-30 ENCOUNTER — Ambulatory Visit (HOSPITAL_COMMUNITY)
Admission: EM | Admit: 2022-06-30 | Discharge: 2022-06-30 | Disposition: A | Discharge status: Home / Self Care | Payer: BC Managed Care – PPO | Attending: Emergency Medicine | Admitting: Emergency Medicine

## 2022-06-30 ENCOUNTER — Encounter (HOSPITAL_COMMUNITY)
Admission: EM | Disposition: A | Discharge status: Home / Self Care | Payer: Self-pay | Source: Home / Self Care | Attending: Emergency Medicine

## 2022-06-30 ENCOUNTER — Encounter: Payer: Self-pay | Admitting: Physician Assistant

## 2022-06-30 DIAGNOSIS — N185 Chronic kidney disease, stage 5: Secondary | ICD-10-CM | POA: Diagnosis not present

## 2022-06-30 DIAGNOSIS — Z0389 Encounter for observation for other suspected diseases and conditions ruled out: Secondary | ICD-10-CM | POA: Diagnosis not present

## 2022-06-30 DIAGNOSIS — T8242XA Displacement of vascular dialysis catheter, initial encounter: Secondary | ICD-10-CM | POA: Diagnosis not present

## 2022-06-30 DIAGNOSIS — E8779 Other fluid overload: Secondary | ICD-10-CM | POA: Diagnosis not present

## 2022-06-30 DIAGNOSIS — Y848 Other medical procedures as the cause of abnormal reaction of the patient, or of later complication, without mention of misadventure at the time of the procedure: Secondary | ICD-10-CM | POA: Insufficient documentation

## 2022-06-30 DIAGNOSIS — Z992 Dependence on renal dialysis: Secondary | ICD-10-CM | POA: Insufficient documentation

## 2022-06-30 DIAGNOSIS — N186 End stage renal disease: Secondary | ICD-10-CM | POA: Diagnosis not present

## 2022-06-30 DIAGNOSIS — T82898A Other specified complication of vascular prosthetic devices, implants and grafts, initial encounter: Secondary | ICD-10-CM | POA: Diagnosis not present

## 2022-06-30 DIAGNOSIS — I120 Hypertensive chronic kidney disease with stage 5 chronic kidney disease or end stage renal disease: Secondary | ICD-10-CM | POA: Insufficient documentation

## 2022-06-30 DIAGNOSIS — Z789 Other specified health status: Secondary | ICD-10-CM

## 2022-06-30 DIAGNOSIS — Z79899 Other long term (current) drug therapy: Secondary | ICD-10-CM | POA: Diagnosis not present

## 2022-06-30 DIAGNOSIS — I739 Peripheral vascular disease, unspecified: Secondary | ICD-10-CM | POA: Diagnosis not present

## 2022-06-30 HISTORY — PX: DIALYSIS/PERMA CATHETER INSERTION: CATH118288

## 2022-06-30 LAB — COMPREHENSIVE METABOLIC PANEL
ALT: <5 U/L (ref 0–44)
AST: 9 U/L — ABNORMAL LOW (ref 15–41)
Albumin: 3.3 g/dL — ABNORMAL LOW (ref 3.5–5.0)
Alkaline Phosphatase: 66 U/L (ref 38–126)
Anion gap: 18 — ABNORMAL HIGH (ref 5–15)
BUN: 64 mg/dL — ABNORMAL HIGH (ref 6–20)
CO2: 25 mmol/L (ref 22–32)
Calcium: 9.6 mg/dL (ref 8.9–10.3)
Chloride: 89 mmol/L — ABNORMAL LOW (ref 98–111)
Creatinine, Ser: 12.87 mg/dL — ABNORMAL HIGH (ref 0.44–1.00)
GFR, Estimated: 4 mL/min — ABNORMAL LOW (ref >60)
Glucose, Bld: 111 mg/dL — ABNORMAL HIGH (ref 70–99)
Potassium: 4.6 mmol/L (ref 3.5–5.1)
Sodium: 132 mmol/L — ABNORMAL LOW (ref 135–145)
Total Bilirubin: 0.4 mg/dL (ref 0.3–1.2)
Total Protein: 6.1 g/dL — ABNORMAL LOW (ref 6.5–8.1)

## 2022-06-30 LAB — CBC WITH DIFFERENTIAL/PLATELET
Abs Immature Granulocytes: 0.04 10*3/uL (ref 0.00–0.07)
Basophils Absolute: 0 10*3/uL (ref 0.0–0.1)
Basophils Relative: 0 %
Eosinophils Absolute: 0.4 10*3/uL (ref 0.0–0.5)
Eosinophils Relative: 5 %
HCT: 19.9 % — ABNORMAL LOW (ref 36.0–46.0)
Hemoglobin: 6.8 g/dL — CL (ref 12.0–15.0)
Immature Granulocytes: 1 %
Lymphocytes Relative: 11 %
Lymphs Abs: 0.8 10*3/uL (ref 0.7–4.0)
MCH: 31.3 pg (ref 26.0–34.0)
MCHC: 34.2 g/dL (ref 30.0–36.0)
MCV: 91.7 fL (ref 80.0–100.0)
Monocytes Absolute: 0.7 10*3/uL (ref 0.1–1.0)
Monocytes Relative: 9 %
Neutro Abs: 5.3 10*3/uL (ref 1.7–7.7)
Neutrophils Relative %: 74 %
Platelets: 205 10*3/uL (ref 150–400)
RBC: 2.17 MIL/uL — ABNORMAL LOW (ref 3.87–5.11)
RDW: 14.4 % (ref 11.5–15.5)
WBC: 7.2 10*3/uL (ref 4.0–10.5)
nRBC: 0 % (ref 0.0–0.2)

## 2022-06-30 SURGERY — DIALYSIS/PERMA CATHETER INSERTION
Anesthesia: LOCAL

## 2022-06-30 MED ORDER — LIDOCAINE HCL (PF) 1 % IJ SOLN
INTRAMUSCULAR | Status: DC | PRN
  Administered 2022-06-30: 15 mL

## 2022-06-30 MED ORDER — HEPARIN SODIUM (PORCINE) 1000 UNIT/ML IJ SOLN
INTRAMUSCULAR | Status: DC | PRN
  Administered 2022-06-30 (×2): 1600 [IU] via INTRAVENOUS

## 2022-06-30 MED ORDER — HEPARIN (PORCINE) IN NACL 1000-0.9 UT/500ML-% IV SOLN
INTRAVENOUS | Status: AC
  Filled 2022-06-30: qty 500

## 2022-06-30 MED ORDER — LIDOCAINE HCL (PF) 1 % IJ SOLN
INTRAMUSCULAR | Status: AC
  Filled 2022-06-30: qty 30

## 2022-06-30 MED ORDER — HYDROCODONE-ACETAMINOPHEN 5-325 MG PO TABS
1.0000 | ORAL_TABLET | Freq: Once | ORAL | Status: AC
  Administered 2022-06-30: 1 via ORAL
  Filled 2022-06-30: qty 1

## 2022-06-30 MED ORDER — HEPARIN (PORCINE) IN NACL 1000-0.9 UT/500ML-% IV SOLN
INTRAVENOUS | Status: DC | PRN
  Administered 2022-06-30: 500 mL

## 2022-06-30 SURGICAL SUPPLY — 6 items
CATH PALINDROME-P 19CM W/VT (CATHETERS) IMPLANT
COVER DOME SNAP 22 D (MISCELLANEOUS) IMPLANT
PROTECTION STATION PRESSURIZED (MISCELLANEOUS) ×1
SHEATH PROBE COVER 6X72 (BAG) IMPLANT
STATION PROTECTION PRESSURIZED (MISCELLANEOUS) IMPLANT
TRAY PV CATH (CUSTOM PROCEDURE TRAY) ×1 IMPLANT

## 2022-06-30 NOTE — Op Note (Signed)
Date: June 30, 2022  Pre-operative diagnosis: End-stage renal disease with dislodged right IJ tunneled dialysis catheter  Post-operative diagnosis: Same  Procedure: Exchange of right IJ tunneled dialysis catheter (19 cm Palindrome)  Surgeon: Dr. Marty Heck, MD  Assistant: Cath Lab staff  Indications: 36 year old female with end-stage renal disease that was seen in the ED today with a dislodged right IJ tunneled dialysis catheter.  She presents today for exchange of her tunneled dialysis catheter after risks benefits discussed.  Findings: The tip of the existing right IJ TDC was in the superior vena cava.  The catheter was transected and wire was passed passed into the right atrium and we exchanged for a new 19 cm tunneled dialysis catheter that flushed and aspirated easily.  Anesthesia: Local  Details: Patient was taken to the Cath Lab after risks benefits discussed and informed consent was obtained.  The right IJ catheter and chest wall were prepped and draped in usual sterile fashion.  I then checked under fluoroscopy and the tip of the existing catheter was in the superior vena cava.  I then transected the catheter and advanced a wire through the existing catheter into the right atrium.  I then remove the remainder of the existing catheter that had had been dislodged and this was passed off the field.  I then placed a new 19 cm tunnel dialysis catheter over the wire under fluoroscopy with the tip into the right atrium making sure the cuff was under the skin.  This was secured with multiple 3-0 nylons.  Flushed and aspirated easily.  Secured with sterile dressings and loaded with heparin according to manufacturer.    Complication: None  Condition: Stable  Marty Heck, MD [facility] Office: [place]

## 2022-06-30 NOTE — ED Triage Notes (Signed)
Pt reports dialysis home daily.  Pt had surgery on the 1st.  Last dialysis at home on Saturday.  Pt reports "cuff is showing".  Pt reports pain due to not having medication from surgery.    Pt states she was trying to access cath by removing bandage that the hospital placed when it cath started to come off.

## 2022-06-30 NOTE — ED Notes (Signed)
Critical from [facility] in the lab of Hgb 6.8.

## 2022-06-30 NOTE — ED Provider Notes (Signed)
[facility] EMERGENCY DEPARTMENT Provider Note   CSN: [id number] Arrival date & time: 06/30/22  0543     History  Chief Complaint  Patient presents with   Vascular Access Problem   HPI Holly [name] is a 36 y.o. female with ESRD and hypertension presenting for vascular access problem. States that she had a recent hysterectomy on Nov 1.  Was removing a bandage on Saturday around her catheter site when she felt catheter "moved". Yesterday evening she reinspected her catheter site and noted that the cuff was exposed this prompted her to be evaluated today in the emergency department. Patient states that she feels that she is "volume up".  States she is short of breath, denies chest pain.  States that her abdomen "feels full" and ankles are more swollen.  She receives dialysis daily at home.  States that her last dialysis was Saturday because of her catheter malfunction.       Home Medications Prior to Admission medications   Medication Sig Start Date End Date Taking? Authorizing Provider  acetaminophen (TYLENOL) 500 MG tablet Take 1,000 mg by mouth every 6 (six) hours as needed for headache (pain).    [provider]  acyclovir (ZOVIRAX) 200 MG capsule Take 200 mg by mouth 2 (two) times daily as needed (out breaks). 08/14/21   [provider]  albuterol (VENTOLIN HFA) 108 (90 Base) MCG/ACT inhaler Inhale 2-4 puffs into the lungs every 4 (four) hours as needed for wheezing (or cough). 04/11/22   Lilland, Alana, DO  amLODipine (NORVASC) 10 MG tablet Take 1 tablet (10 mg total) by mouth daily. 02/02/22   Loletha Grayer, MD  carvedilol (COREG) 12.5 MG tablet Take 12.5 mg by mouth 2 (two) times daily with a meal.    [provider]  cetirizine (ZYRTEC) 10 MG tablet Take 10 mg by mouth at bedtime.    [provider]  cinacalcet (SENSIPAR) 60 MG tablet Take 60 mg by mouth at bedtime.    [provider]  diclofenac Sodium  (VOLTAREN) 1 % GEL Apply 1 application  topically 4 (four) times daily as needed (hand,feet,thighs and back pain).    [provider]  gabapentin (NEURONTIN) 100 MG capsule Take 100 mg by mouth 3 (three) times daily. 01/15/22   [provider]  levothyroxine (SYNTHROID) 200 MCG tablet Take 200 mcg by mouth daily before breakfast.    [provider]  megestrol (MEGACE) 40 MG tablet Take 2 tablets (80 mg total) by mouth daily. 02/20/22   Jennye Boroughs, MD  ondansetron (ZOFRAN-ODT) 4 MG disintegrating tablet Take 4 mg by mouth every 8 (eight) hours as needed for nausea or vomiting. 01/08/22   [provider]  VELPHORO 500 MG chewable tablet Chew 500 mg by mouth 3 (three) times daily with meals. 02/12/22   [provider]      Allergies    Rituximab    Review of Systems   Review of Systems  Respiratory:  Positive for shortness of breath.     Physical Exam Updated Vital Signs BP (!) 143/88 (BP Location: Right Arm)   Pulse 79   Temp 97.8 F (36.6 C) (Oral)   Resp 16   Ht '5\' 7"'$  (1.702 m)   Wt 102.5 kg   SpO2 96%   BMI 35.40 kg/m  Physical Exam Vitals and nursing note reviewed.  HENT:     Head: Normocephalic and atraumatic.     Mouth/Throat:     Mouth: Mucous  membranes are moist.  Eyes:     General:        Right eye: No discharge.        Left eye: No discharge.     Conjunctiva/sclera: Conjunctivae normal.  Cardiovascular:     Rate and Rhythm: Normal rate and regular rhythm.     Pulses: Normal pulses.          Dorsalis pedis pulses are 2+ on the right side and 2+ on the left side.     Heart sounds: Normal heart sounds.  Pulmonary:     Effort: Pulmonary effort is normal.     Breath sounds: Normal breath sounds.  Abdominal:     General: Abdomen is flat and protuberant.     Palpations: Abdomen is soft.  Musculoskeletal:     Comments: Bilateral ankle 1+ swelling  Skin:    General: Skin is warm and dry.     Comments: Catheter  visualized with cuff located approximately 4 cm from the insertion site  Neurological:     General: No focal deficit present.  Psychiatric:        Mood and Affect: Mood normal.     ED Results / Procedures / Treatments   Labs (all labs ordered are listed, but only abnormal results are displayed) Labs Reviewed  COMPREHENSIVE METABOLIC PANEL - Abnormal; Notable for the following components:      Result Value   Sodium 132 (*)    Chloride 89 (*)    Glucose, Bld 111 (*)    BUN 64 (*)    Creatinine, Ser 12.87 (*)    Total Protein 6.1 (*)    Albumin 3.3 (*)    AST 9 (*)    GFR, Estimated 4 (*)    Anion gap 18 (*)    All other components within normal limits  CBC WITH DIFFERENTIAL/PLATELET - Abnormal; Notable for the following components:   RBC 2.17 (*)    Hemoglobin 6.8 (*)    HCT 19.9 (*)    All other components within normal limits    EKG None  Radiology DG Chest 2 View  Result Date: 06/30/2022 CLINICAL DATA:  Displaced dialysis catheter. EXAM: CHEST - 2 VIEW COMPARISON:  Chest radiograph 04/11/2022 FINDINGS: The right-sided dialysis catheter has been retracted in the tip now projects in the mid to upper SVC. The catheter is intact. The cardiomediastinal silhouette is stable. There is no focal consolidation or pulmonary edema. There is no pleural effusion or pneumothorax There is no acute osseous abnormality. IMPRESSION: Interval retraction of the dialysis catheter with the tip now projecting over the mid to upper SVC. Electronically Signed   By: Valetta Mole M.D.   On: 06/30/2022 10:35    Procedures Procedures    Medications Ordered in ED Medications  HYDROcodone-acetaminophen (NORCO/VICODIN) 5-325 MG per tablet 1 tablet (1 tablet Oral Given 06/30/22 1014)    ED Course/ Medical Decision Making/ A&P Clinical Course as of 06/30/22 1349  Mon Jun 30, 2022  1313 Contacted vascular surgery.  Stated that they will be able to do a catheter exchange procedure this afternoon [JR]     Clinical Course User Index [JR] Harriet Pho, PA-C                           Medical Decision Making Amount and/or Complexity of Data Reviewed Labs: ordered.   Patient presented for vascular access problem.  Patient has ESRD and has dialysis at home.  States that her new CVC was placed on October 16.  Patient was concerned that on Saturday she had dislodged the catheter and on Sunday she actually did see the cuff which was above the skin.  On exam, I saw the cuff was indeed out approximately 4 cm from the insertion site.  Considered volume overload given that she has not received dialysis since Saturday.  Clinically, she did not appear in respiratory distress, lung sounds were clear and equal, x-ray also did not reveal concern for significant pulmonary edema or effusion.  Did note mild bilateral ankle edema but overall was not concern for significant volume overload given her clinical presentation.  Also considered electrolyte derangement, BUN was elevated above baseline.  Considered treating but patient's mental status was normal with no nausea vomiting or diarrhea, advised patient to restart dialysis as soon as possible at home.  Also considered hypokalemia but CMP revealed potassium was in normal limits.  Consulted vascular surgery.  Dr. Donzetta Matters stated that he would be able to do the catheter exchange this afternoon and plan to discharge her after the procedure.         Final Clinical Impression(s) / ED Diagnoses Final diagnoses:  Problem with vascular access    Rx / DC Orders ED Discharge Orders     None         Harriet Pho, PA-C 06/30/22 1520    Long, Wonda Old[facility], MD 07/08/22 [phone]

## 2022-06-30 NOTE — H&P (Signed)
H+P   History of Present Illness: This is a 36 y.o. female history of end-stage renal disease dialyzing via right IJ tunneled dialysis catheter.  Today she noticed that the catheter was hanging out she was unable to use for dialysis.  She usually dialyzes at home but did miss dialysis today.  She does not have any other complaints.  Past Medical History:  Diagnosis Date   Anemia    Anxiety    Arthritis    hands   Blood transfusion without reported diagnosis    Cancer ([place])    Complication of anesthesia    with Fentanyl she is difficult to awake   Depression    Dialysis patient ([place])    Dyspnea    due to anemia   Epilepsy ([place])    Hashimoto's disease    History of kidney stones    HLD (hyperlipidemia)    Hypertension    Hypothyroidism    Pneumonia    Renal disorder    Thyroid disease     Past Surgical History:  Procedure Laterality Date   ABDOMINAL SURGERY     AV FISTULA PLACEMENT Left 01/17/2022   Procedure: ARTERIOVENOUS (AV) FISTULA CREATION ( RADIAL CEPHALIC);  Surgeon: Katha Cabal, MD;  Location: [facility] ORS;  Service: Vascular;  Laterality: Left;   CERVICAL BIOPSY  W/ LOOP ELECTRODE EXCISION  2018   COLONOSCOPY N/A 01/30/2022   Procedure: COLONOSCOPY;  Surgeon: Annamaria Helling, DO;  Location: [facility] ENDOSCOPY;  Service: Gastroenterology;  Laterality: N/A;   COLONOSCOPY WITH PROPOFOL N/A 02/01/2022   Procedure: COLONOSCOPY WITH PROPOFOL;  Surgeon: Lesly Rubenstein, MD;  Location: [facility] ENDOSCOPY;  Service: Endoscopy;  Laterality: N/A;   DIAGNOSTIC LAPAROSCOPY     due to removal of PD catheters   DIALYSIS/PERMA CATHETER INSERTION N/A 06/09/2022   Procedure: DIALYSIS/PERMA CATHETER INSERTION;  Surgeon: Algernon Huxley, MD;  Location: [place] CV LAB;  Service: Cardiovascular;  Laterality: N/A;   ESOPHAGOGASTRODUODENOSCOPY N/A 01/30/2022   Procedure: ESOPHAGOGASTRODUODENOSCOPY (EGD);  Surgeon: Annamaria Helling, DO;  Location: [facility] ENDOSCOPY;  Service:  Gastroenterology;  Laterality: N/A;   ESOPHAGOGASTRODUODENOSCOPY (EGD) WITH PROPOFOL N/A 02/01/2022   Procedure: ESOPHAGOGASTRODUODENOSCOPY (EGD) WITH PROPOFOL;  Surgeon: Lesly Rubenstein, MD;  Location: [facility] ENDOSCOPY;  Service: Endoscopy;  Laterality: N/A;   RENAL BIOPSY     x 2   TUMOR REMOVAL     right face   UTERINE ARTERY EMBOLIZATION  05/04/2022    Allergies  Allergen Reactions   Rituximab Anaphylaxis and Swelling    Prior to Admission medications   Medication Sig Start Date End Date Taking? Authorizing Provider  acetaminophen (TYLENOL) 500 MG tablet Take 1,000 mg by mouth every 6 (six) hours as needed for headache (pain).    [provider]  acyclovir (ZOVIRAX) 200 MG capsule Take 200 mg by mouth 2 (two) times daily as needed (out breaks). 08/14/21   [provider]  albuterol (VENTOLIN HFA) 108 (90 Base) MCG/ACT inhaler Inhale 2-4 puffs into the lungs every 4 (four) hours as needed for wheezing (or cough). 04/11/22   Lilland, Alana, DO  amLODipine (NORVASC) 10 MG tablet Take 1 tablet (10 mg total) by mouth daily. 02/02/22   Loletha Grayer, MD  carvedilol (COREG) 12.5 MG tablet Take 12.5 mg by mouth 2 (two) times daily with a meal.    [provider]  cetirizine (ZYRTEC) 10 MG tablet Take 10 mg by mouth at bedtime.    [provider]  cinacalcet (SENSIPAR) 60 MG  tablet Take 60 mg by mouth at bedtime.    [provider]  diclofenac Sodium (VOLTAREN) 1 % GEL Apply 1 application  topically 4 (four) times daily as needed (hand,feet,thighs and back pain).    [provider]  gabapentin (NEURONTIN) 100 MG capsule Take 100 mg by mouth 3 (three) times daily. 01/15/22   [provider]  levothyroxine (SYNTHROID) 200 MCG tablet Take 200 mcg by mouth daily before breakfast.    [provider]  megestrol (MEGACE) 40 MG tablet Take 2 tablets (80 mg total) by mouth daily. 02/20/22   Jennye Boroughs, MD  ondansetron  (ZOFRAN-ODT) 4 MG disintegrating tablet Take 4 mg by mouth every 8 (eight) hours as needed for nausea or vomiting. 01/08/22   [provider]  VELPHORO 500 MG chewable tablet Chew 500 mg by mouth 3 (three) times daily with meals. 02/12/22   [provider]    Social History   Socioeconomic History   Marital status: Significant Other    Spouse name: [name]   Number of children: Not on file   Years of education: Not on file   Highest education level: Not on file  Occupational History   Not on file  Tobacco Use   Smoking status: Former    Types: Cigarettes   Smokeless tobacco: Never  Vaping Use   Vaping Use: Never used  Substance and Sexual Activity   Alcohol use: Never   Drug use: Never   Sexual activity: Not on file  Other Topics Concern   Not on file  Social History Narrative   Not on file   Social Determinants of Health   Financial Resource Strain: Not on file  Food Insecurity: Not on file  Transportation Needs: Not on file  Physical Activity: Not on file  Stress: Not on file  Social Connections: Not on file  Intimate Partner Violence: Not on file     Family History  Problem Relation Age of Onset   Multiple sclerosis Mother    Cancer Father    COPD Father     ROS: Dialysis catheter is dislodged  Physical Examination  Vitals:   06/30/22 1300 06/30/22 1330  BP: (!) 159/100 (!) 143/88  Pulse: 84 79  Resp: 18 16  Temp:    SpO2: 97% 96%   Body mass index is 35.4 kg/m.  Awake alert oriented Nonlabored respirations Right IJ tunneled dialysis catheter cuff is readily apparent, there is no evidence of hematoma  CBC    Component Value Date/Time   WBC 7.2 06/30/2022 1217   RBC 2.17 (L) 06/30/2022 1217   HGB 6.8 (LL) 06/30/2022 1217   HGB 7.1 (L) 05/29/2022 1314   HCT 19.9 (L) 06/30/2022 1217   PLT 205 06/30/2022 1217   PLT 180 05/29/2022 1314   MCV 91.7 06/30/2022 1217   MCH 31.3 06/30/2022 1217   MCHC 34.2 06/30/2022 1217   RDW  14.4 06/30/2022 1217   LYMPHSABS 0.8 06/30/2022 1217   MONOABS 0.7 06/30/2022 1217   EOSABS 0.4 06/30/2022 1217   BASOSABS 0.0 06/30/2022 1217    BMET    Component Value Date/Time   NA 132 (L) 06/30/2022 1217   K 4.6 06/30/2022 1217   CL 89 (L) 06/30/2022 1217   CO2 25 06/30/2022 1217   GLUCOSE 111 (H) 06/30/2022 1217   BUN 64 (H) 06/30/2022 1217   CREATININE 12.87 (H) 06/30/2022 1217   CREATININE 9.97 (HH) 03/31/2022 1310   CALCIUM 9.6 06/30/2022 1217   GFRNONAA  4 (L) 06/30/2022 1217   GFRNONAA 5 (L) 03/31/2022 1310   GFRAA 3 (L) 02/19/2020 2302    COAGS: Lab Results  Component Value Date   INR 1.1 02/18/2022    ASSESSMENT/PLAN: This is a 36 y.o. female with end-stage renal disease currently dialyzing via right IJ tunneled dialysis catheter that became dislodged earlier today.  Plan will be to replace today in the Cath Lab and then she can return to home to dialyze either later today or tomorrow.    Patient is noted to be anemic but states this is stable for her and she is asymptomatic from the standpoint.  Nelson Noone C. Donzetta Matters, MD [facility] Office: [phone] Pager: [phone]

## 2022-06-30 NOTE — ED Provider Triage Note (Signed)
Emergency Medicine Provider Triage Evaluation Note  Holly [name] , a 36 y.o. female  was evaluated in triage.  Pt complains of recently tugging on her HD catheter from the right chest wall.  When she remove the bandage this morning it was excellently adhered to the catheter itself and she removed it approximately 10 cm.  Cuff visible on the chest wall insertion site.  No pain or bleeding from the site.  Does home hemodialysis. One week s/p hysterectomy.   Review of Systems  Positive:  Negative: As above as above  Physical Exam  BP (!) 155/86 (BP Location: Right Arm)   Pulse 98   Temp 97.9 F (36.6 C) (Oral)   Resp 17   Ht '5\' 7"'$  (1.702 m)   Wt 102.5 kg   SpO2 100%   BMI 35.40 kg/m  Gen:   Awake, no distress   Resp:  Normal effort  MSK:   Moves extremities without difficulty  Other:    Medical Decision Making  Medically screening exam initiated at 6:35 AM.  Appropriate orders placed.  Paralee Cancel was informed that the remainder of the evaluation will be completed by another provider, this initial triage assessment does not replace that evaluation, and the importance of remaining in the ED until their evaluation is complete.  Patient will require IR consult once day team arrives.  This chart was dictated using voice recognition software, Dragon. Despite the best efforts of this provider to proofread and correct errors, errors may still occur which can change documentation meaning.    Emeline Darling, PA-C 06/30/22 [phone]

## 2022-07-01 ENCOUNTER — Encounter (HOSPITAL_COMMUNITY): Payer: Self-pay | Admitting: Vascular Surgery

## 2022-07-01 DIAGNOSIS — E8779 Other fluid overload: Secondary | ICD-10-CM | POA: Diagnosis not present

## 2022-07-01 DIAGNOSIS — Z992 Dependence on renal dialysis: Secondary | ICD-10-CM | POA: Diagnosis not present

## 2022-07-01 DIAGNOSIS — N186 End stage renal disease: Secondary | ICD-10-CM | POA: Diagnosis not present

## 2022-07-02 DIAGNOSIS — Z992 Dependence on renal dialysis: Secondary | ICD-10-CM | POA: Diagnosis not present

## 2022-07-02 DIAGNOSIS — N186 End stage renal disease: Secondary | ICD-10-CM | POA: Diagnosis not present

## 2022-07-02 DIAGNOSIS — E8779 Other fluid overload: Secondary | ICD-10-CM | POA: Diagnosis not present

## 2022-07-03 DIAGNOSIS — E8779 Other fluid overload: Secondary | ICD-10-CM | POA: Diagnosis not present

## 2022-07-03 DIAGNOSIS — Z992 Dependence on renal dialysis: Secondary | ICD-10-CM | POA: Diagnosis not present

## 2022-07-03 DIAGNOSIS — N186 End stage renal disease: Secondary | ICD-10-CM | POA: Diagnosis not present

## 2022-07-04 DIAGNOSIS — Z992 Dependence on renal dialysis: Secondary | ICD-10-CM | POA: Diagnosis not present

## 2022-07-04 DIAGNOSIS — N186 End stage renal disease: Secondary | ICD-10-CM | POA: Diagnosis not present

## 2022-07-04 DIAGNOSIS — E8779 Other fluid overload: Secondary | ICD-10-CM | POA: Diagnosis not present

## 2022-07-05 DIAGNOSIS — N186 End stage renal disease: Secondary | ICD-10-CM | POA: Diagnosis not present

## 2022-07-05 DIAGNOSIS — Z992 Dependence on renal dialysis: Secondary | ICD-10-CM | POA: Diagnosis not present

## 2022-07-05 DIAGNOSIS — E8779 Other fluid overload: Secondary | ICD-10-CM | POA: Diagnosis not present

## 2022-07-06 DIAGNOSIS — E8779 Other fluid overload: Secondary | ICD-10-CM | POA: Diagnosis not present

## 2022-07-06 DIAGNOSIS — Z992 Dependence on renal dialysis: Secondary | ICD-10-CM | POA: Diagnosis not present

## 2022-07-06 DIAGNOSIS — N186 End stage renal disease: Secondary | ICD-10-CM | POA: Diagnosis not present

## 2022-07-07 DIAGNOSIS — N186 End stage renal disease: Secondary | ICD-10-CM | POA: Diagnosis not present

## 2022-07-07 DIAGNOSIS — E8779 Other fluid overload: Secondary | ICD-10-CM | POA: Diagnosis not present

## 2022-07-07 DIAGNOSIS — Z992 Dependence on renal dialysis: Secondary | ICD-10-CM | POA: Diagnosis not present

## 2022-07-08 DIAGNOSIS — N186 End stage renal disease: Secondary | ICD-10-CM | POA: Diagnosis not present

## 2022-07-08 DIAGNOSIS — Z992 Dependence on renal dialysis: Secondary | ICD-10-CM | POA: Diagnosis not present

## 2022-07-08 DIAGNOSIS — E8779 Other fluid overload: Secondary | ICD-10-CM | POA: Diagnosis not present

## 2022-07-09 ENCOUNTER — Encounter (HOSPITAL_COMMUNITY): Payer: Self-pay | Admitting: Vascular Surgery

## 2022-07-09 DIAGNOSIS — N186 End stage renal disease: Secondary | ICD-10-CM | POA: Diagnosis not present

## 2022-07-09 DIAGNOSIS — Z992 Dependence on renal dialysis: Secondary | ICD-10-CM | POA: Diagnosis not present

## 2022-07-09 DIAGNOSIS — E8779 Other fluid overload: Secondary | ICD-10-CM | POA: Diagnosis not present

## 2022-07-10 DIAGNOSIS — Z992 Dependence on renal dialysis: Secondary | ICD-10-CM | POA: Diagnosis not present

## 2022-07-10 DIAGNOSIS — E8779 Other fluid overload: Secondary | ICD-10-CM | POA: Diagnosis not present

## 2022-07-10 DIAGNOSIS — N186 End stage renal disease: Secondary | ICD-10-CM | POA: Diagnosis not present

## 2022-07-11 DIAGNOSIS — Z87891 Personal history of nicotine dependence: Secondary | ICD-10-CM | POA: Diagnosis not present

## 2022-07-11 DIAGNOSIS — Z888 Allergy status to other drugs, medicaments and biological substances status: Secondary | ICD-10-CM | POA: Diagnosis not present

## 2022-07-11 DIAGNOSIS — M898X8 Other specified disorders of bone, other site: Secondary | ICD-10-CM | POA: Diagnosis not present

## 2022-07-11 DIAGNOSIS — T8140XA Infection following a procedure, unspecified, initial encounter: Secondary | ICD-10-CM | POA: Diagnosis not present

## 2022-07-11 DIAGNOSIS — Z992 Dependence on renal dialysis: Secondary | ICD-10-CM | POA: Diagnosis not present

## 2022-07-11 DIAGNOSIS — Z9189 Other specified personal risk factors, not elsewhere classified: Secondary | ICD-10-CM | POA: Diagnosis not present

## 2022-07-11 DIAGNOSIS — E8779 Other fluid overload: Secondary | ICD-10-CM | POA: Diagnosis not present

## 2022-07-11 DIAGNOSIS — N3000 Acute cystitis without hematuria: Secondary | ICD-10-CM | POA: Diagnosis not present

## 2022-07-11 DIAGNOSIS — Z881 Allergy status to other antibiotic agents status: Secondary | ICD-10-CM | POA: Diagnosis not present

## 2022-07-11 DIAGNOSIS — N200 Calculus of kidney: Secondary | ICD-10-CM | POA: Diagnosis not present

## 2022-07-11 DIAGNOSIS — K388 Other specified diseases of appendix: Secondary | ICD-10-CM | POA: Diagnosis not present

## 2022-07-11 DIAGNOSIS — N3090 Cystitis, unspecified without hematuria: Secondary | ICD-10-CM | POA: Diagnosis not present

## 2022-07-11 DIAGNOSIS — Z90710 Acquired absence of both cervix and uterus: Secondary | ICD-10-CM | POA: Diagnosis not present

## 2022-07-11 DIAGNOSIS — N951 Menopausal and female climacteric states: Secondary | ICD-10-CM | POA: Diagnosis not present

## 2022-07-11 DIAGNOSIS — E039 Hypothyroidism, unspecified: Secondary | ICD-10-CM | POA: Diagnosis not present

## 2022-07-11 DIAGNOSIS — T8149XA Infection following a procedure, other surgical site, initial encounter: Secondary | ICD-10-CM | POA: Diagnosis not present

## 2022-07-11 DIAGNOSIS — Z79890 Hormone replacement therapy (postmenopausal): Secondary | ICD-10-CM | POA: Diagnosis not present

## 2022-07-11 DIAGNOSIS — N186 End stage renal disease: Secondary | ICD-10-CM | POA: Diagnosis not present

## 2022-07-11 DIAGNOSIS — Z885 Allergy status to narcotic agent status: Secondary | ICD-10-CM | POA: Diagnosis not present

## 2022-07-12 DIAGNOSIS — E8779 Other fluid overload: Secondary | ICD-10-CM | POA: Diagnosis not present

## 2022-07-12 DIAGNOSIS — Z992 Dependence on renal dialysis: Secondary | ICD-10-CM | POA: Diagnosis not present

## 2022-07-12 DIAGNOSIS — N186 End stage renal disease: Secondary | ICD-10-CM | POA: Diagnosis not present

## 2022-07-13 DIAGNOSIS — Z992 Dependence on renal dialysis: Secondary | ICD-10-CM | POA: Diagnosis not present

## 2022-07-13 DIAGNOSIS — E8779 Other fluid overload: Secondary | ICD-10-CM | POA: Diagnosis not present

## 2022-07-13 DIAGNOSIS — N186 End stage renal disease: Secondary | ICD-10-CM | POA: Diagnosis not present

## 2022-07-13 IMAGING — CT CT ABD-PELV W/O CM
2 of 4 series · 15 of 46 positions shown, 17 images · non-contrast
Comparison: None.

CLINICAL DATA: Purulence drainage at peritoneal dialysis

EXAM:
CT ABDOMEN AND PELVIS WITHOUT CONTRAST
TECHNIQUE: Multidetector CT imaging of the abdomen and pelvis was performed
following the standard protocol without IV contrast.

[Series 2: axial st · axial · 0.84mm/px · z∈[+928,+1333]mm · 12 of 93 slices shown, 14 images]
[im 6/93  soft-tissue]
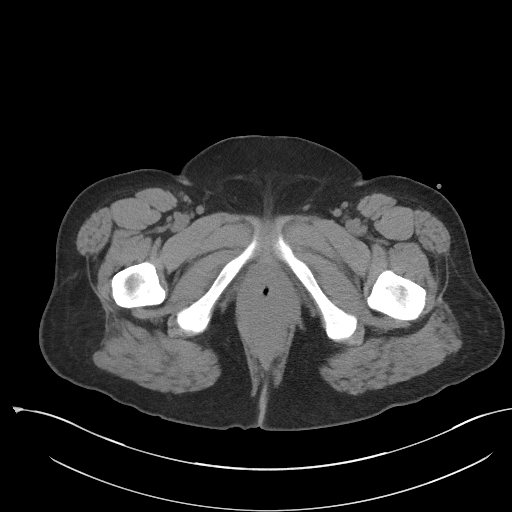
[im 6/93  bone]
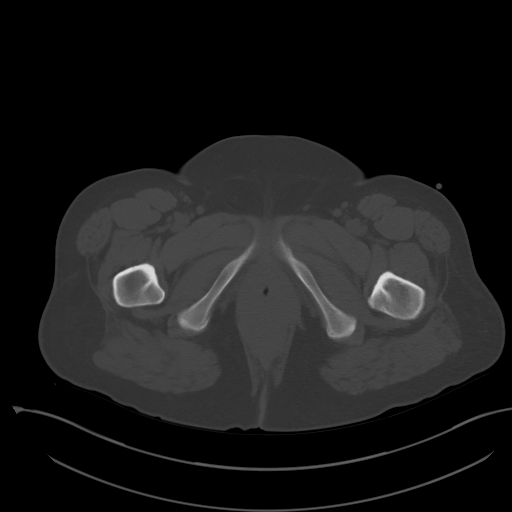
[im 17/93  soft-tissue]
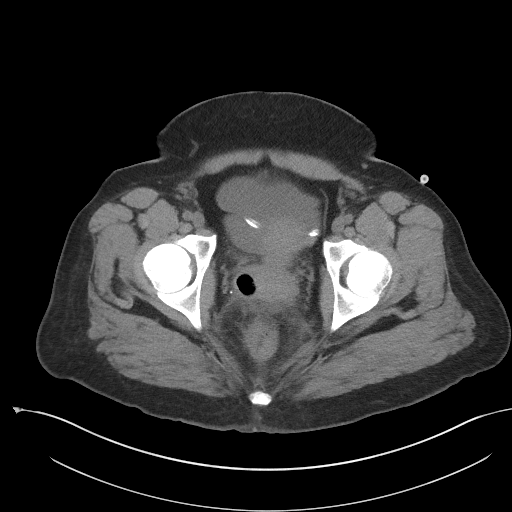
[im 22/93  soft-tissue]
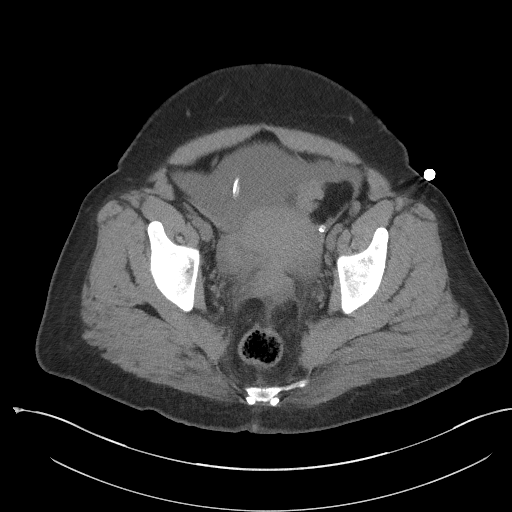
[im 28/93  soft-tissue]
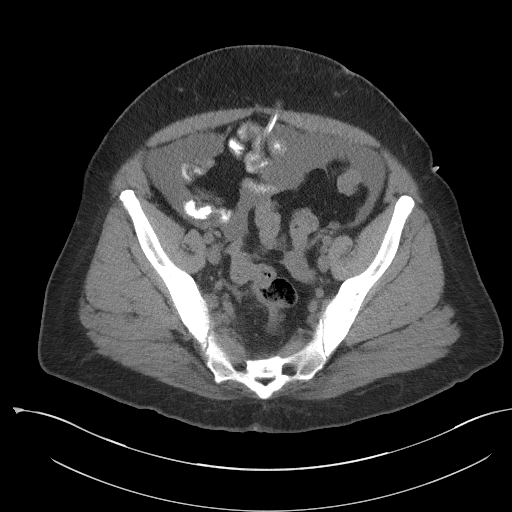
[im 38/93  soft-tissue]
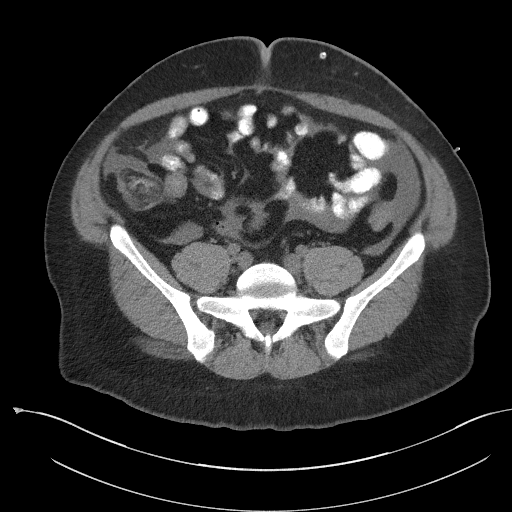
[im 44/93  soft-tissue]
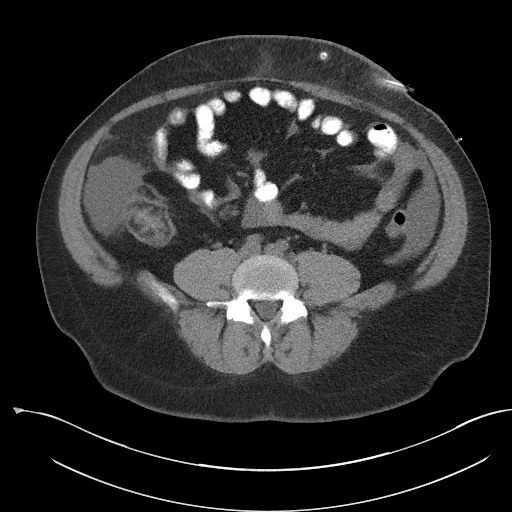
[im 49/93  soft-tissue]
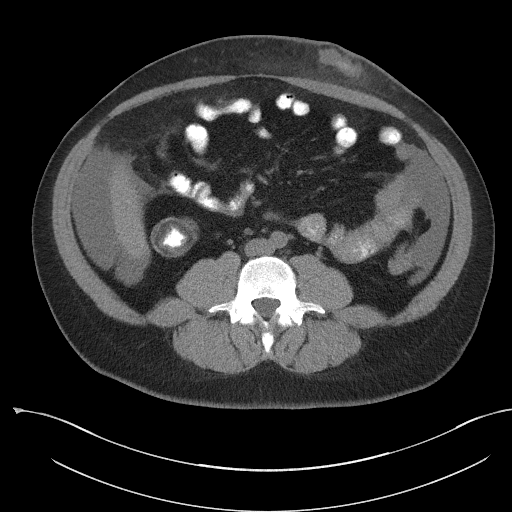
[im 60/93  soft-tissue]
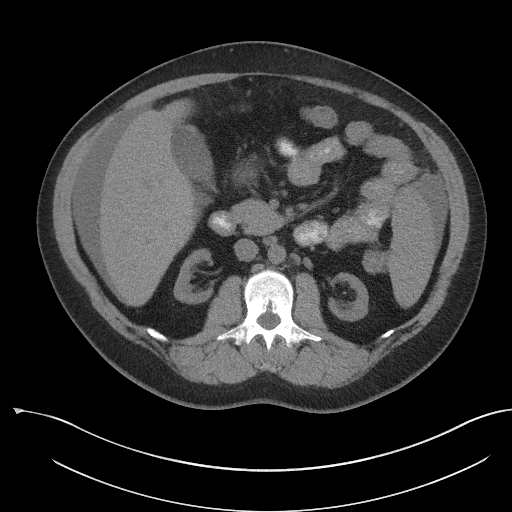
[im 65/93  soft-tissue]
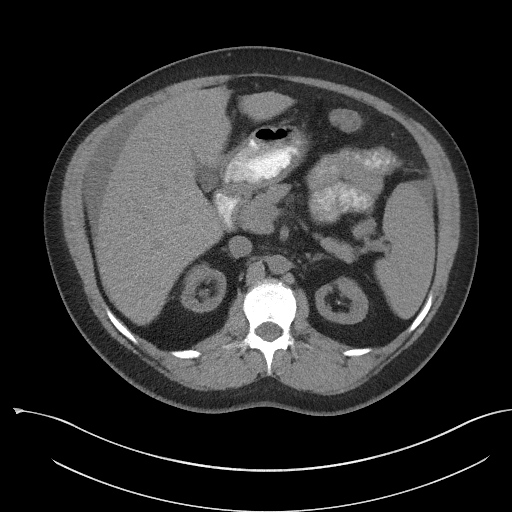
[im 65/93  bone]
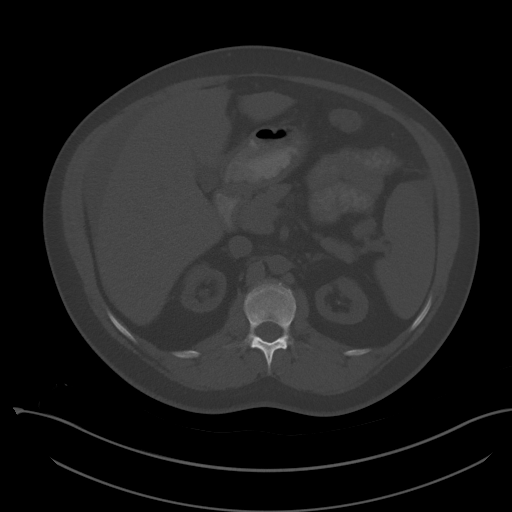
[im 71/93  soft-tissue]
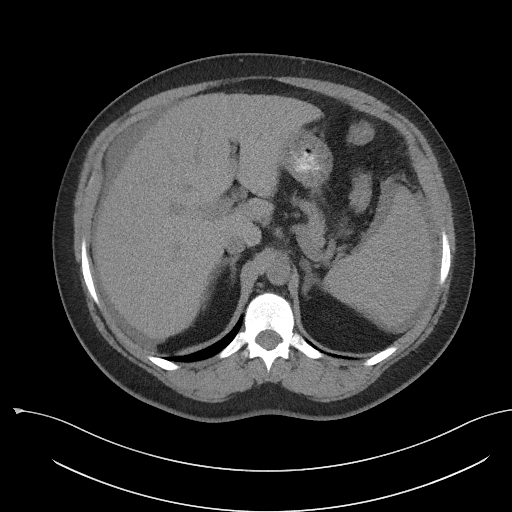
[im 82/93  soft-tissue]
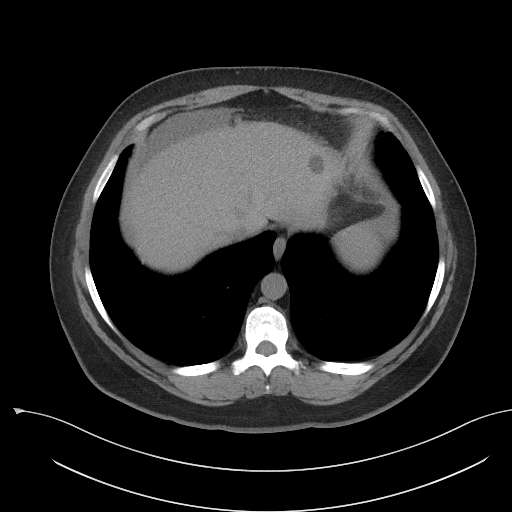
[im 87/93  soft-tissue]
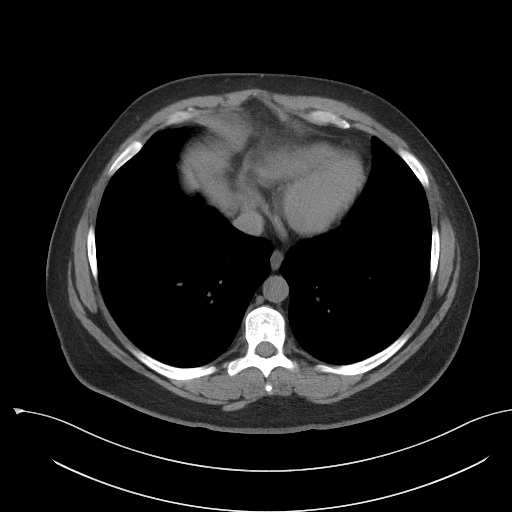

[Series 5: coronal st · coronal · 0.86mm/px · 3 of 111 slices shown]
[im 37/111  soft-tissue]
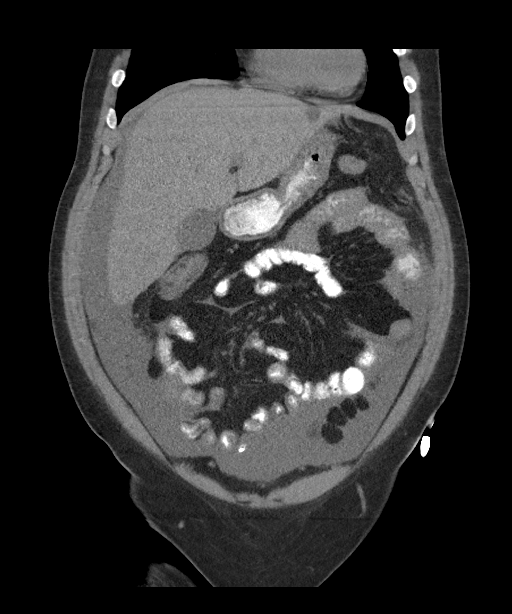
[im 49/111  soft-tissue]
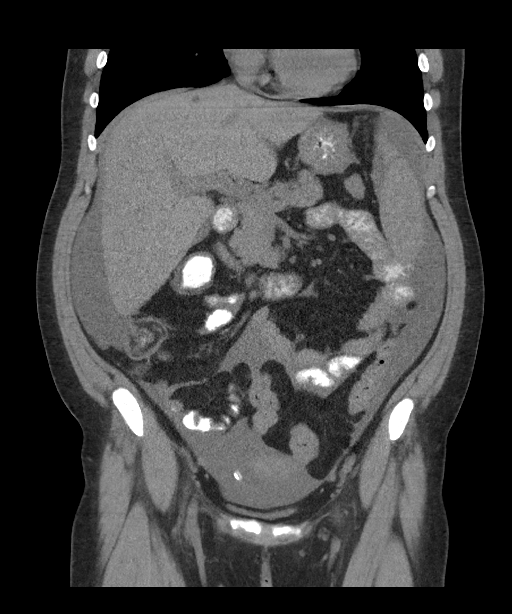
[im 62/111  soft-tissue]
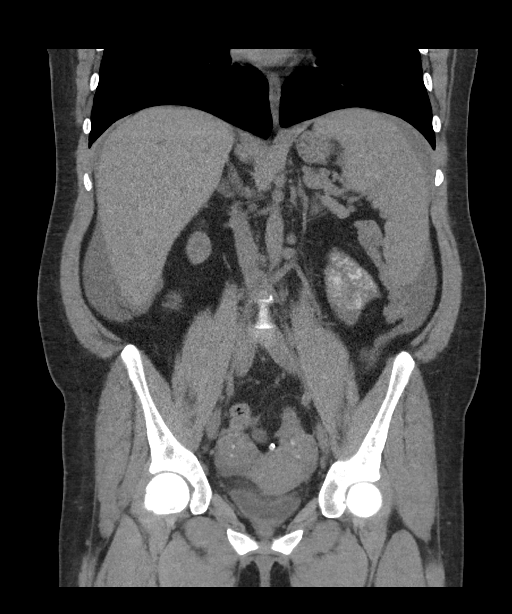

[15 of 46 positions shown; findings below may reference images not displayed]

FINDINGS: Lower chest: No acute abnormality.

Hepatobiliary: Left lobe of liver cyst measures 1.6 cm, image [DATE].
No suspicious liver abnormality identified. Gallbladder is
unremarkable. No biliary dilatation.

Pancreas: Unremarkable. No pancreatic ductal dilatation or
surrounding inflammatory changes.

Spleen: The spleen measures 10.7 x 6.5 by 15.5 cm (volume = 560
cm^3). No focal splenic lesion noted.

Adrenals/Urinary Tract: Normal adrenal glands. Bilateral renal
cortical atrophy noted. Nonobstructing stones identified within the
upper pole collecting system of the left kidney measuring up to 4
mm. No mass or hydronephrosis identified bilaterally. Urinary
bladder is unremarkable.

Stomach/Bowel: Stomach appears normal. Nonspecific intramural fatty
deposition and wall thickening involving the ascending colon
identified. No abnormal bowel distension

Vascular/Lymphatic: Normal appearance of the abdominal aorta. No
abdominopelvic adenopathy identified.

Reproductive: Uterus and bilateral adnexa are unremarkable.

Other: Percutaneous peritoneal dialysis catheter enters from the
abdomen within the left ventral abdominal wall. The distal in
terminates in the left iliac region. There is abnormal increased
soft tissue thickening along the subcutaneous dialysis catheter
tract with surrounding fat stranding and mild skin thickening
concerning for tract infection.

Moderate volume of free fluid is noted within the abdomen and pelvis
consistent with peritoneal dialysate. No loculated fluid collections
identified.

Musculoskeletal: Bilateral sacroiliitis is noted, right greater than
left. No acute or suspicious osseous findings.
IMPRESSION: 1. Abnormal increased soft tissue thickening along the subcutaneous
dialysis catheter tract within the left ventral abdominal. There is
wall surrounding fat stranding and mild skin thickening. Imaging
findings are concerning for catheter tract infection.
2. Moderate volume of free fluid is noted within the abdomen and
pelvis consistent with peritoneal dialysate. No loculated fluid
collections identified.
3. Splenomegaly.
4. Bilateral sacroiliitis, right greater than left.
5. Nonspecific intramural fatty deposition and wall thickening
involving the ascending colon. Correlate for any clinical signs or
symptoms of colitis.
6. Nonobstructing left renal calculi.

## 2022-07-14 DIAGNOSIS — E8779 Other fluid overload: Secondary | ICD-10-CM | POA: Diagnosis not present

## 2022-07-14 DIAGNOSIS — Z992 Dependence on renal dialysis: Secondary | ICD-10-CM | POA: Diagnosis not present

## 2022-07-14 DIAGNOSIS — N186 End stage renal disease: Secondary | ICD-10-CM | POA: Diagnosis not present

## 2022-07-15 DIAGNOSIS — N186 End stage renal disease: Secondary | ICD-10-CM | POA: Diagnosis not present

## 2022-07-15 DIAGNOSIS — Z992 Dependence on renal dialysis: Secondary | ICD-10-CM | POA: Diagnosis not present

## 2022-07-15 DIAGNOSIS — E8779 Other fluid overload: Secondary | ICD-10-CM | POA: Diagnosis not present

## 2022-07-16 DIAGNOSIS — N186 End stage renal disease: Secondary | ICD-10-CM | POA: Diagnosis not present

## 2022-07-16 DIAGNOSIS — Z992 Dependence on renal dialysis: Secondary | ICD-10-CM | POA: Diagnosis not present

## 2022-07-16 DIAGNOSIS — E8779 Other fluid overload: Secondary | ICD-10-CM | POA: Diagnosis not present

## 2022-07-17 DIAGNOSIS — E8779 Other fluid overload: Secondary | ICD-10-CM | POA: Diagnosis not present

## 2022-07-17 DIAGNOSIS — N186 End stage renal disease: Secondary | ICD-10-CM | POA: Diagnosis not present

## 2022-07-17 DIAGNOSIS — Z992 Dependence on renal dialysis: Secondary | ICD-10-CM | POA: Diagnosis not present

## 2022-07-18 DIAGNOSIS — E8779 Other fluid overload: Secondary | ICD-10-CM | POA: Diagnosis not present

## 2022-07-18 DIAGNOSIS — Z992 Dependence on renal dialysis: Secondary | ICD-10-CM | POA: Diagnosis not present

## 2022-07-18 DIAGNOSIS — N186 End stage renal disease: Secondary | ICD-10-CM | POA: Diagnosis not present

## 2022-07-19 DIAGNOSIS — I10 Essential (primary) hypertension: Secondary | ICD-10-CM | POA: Diagnosis not present

## 2022-07-19 DIAGNOSIS — N200 Calculus of kidney: Secondary | ICD-10-CM | POA: Diagnosis not present

## 2022-07-19 DIAGNOSIS — N99840 Postprocedural hematoma of a genitourinary system organ or structure following a genitourinary system procedure: Secondary | ICD-10-CM | POA: Diagnosis not present

## 2022-07-19 DIAGNOSIS — E039 Hypothyroidism, unspecified: Secondary | ICD-10-CM | POA: Diagnosis not present

## 2022-07-19 DIAGNOSIS — R1032 Left lower quadrant pain: Secondary | ICD-10-CM | POA: Diagnosis not present

## 2022-07-19 DIAGNOSIS — R188 Other ascites: Secondary | ICD-10-CM | POA: Insufficient documentation

## 2022-07-19 DIAGNOSIS — N186 End stage renal disease: Secondary | ICD-10-CM | POA: Diagnosis not present

## 2022-07-19 DIAGNOSIS — R112 Nausea with vomiting, unspecified: Secondary | ICD-10-CM | POA: Diagnosis not present

## 2022-07-19 DIAGNOSIS — R1031 Right lower quadrant pain: Secondary | ICD-10-CM | POA: Diagnosis not present

## 2022-07-19 DIAGNOSIS — E8779 Other fluid overload: Secondary | ICD-10-CM | POA: Diagnosis not present

## 2022-07-19 DIAGNOSIS — Z992 Dependence on renal dialysis: Secondary | ICD-10-CM | POA: Diagnosis not present

## 2022-07-20 DIAGNOSIS — D631 Anemia in chronic kidney disease: Secondary | ICD-10-CM | POA: Diagnosis not present

## 2022-07-20 DIAGNOSIS — R1030 Lower abdominal pain, unspecified: Secondary | ICD-10-CM | POA: Diagnosis not present

## 2022-07-20 DIAGNOSIS — N186 End stage renal disease: Secondary | ICD-10-CM | POA: Diagnosis not present

## 2022-07-20 DIAGNOSIS — E039 Hypothyroidism, unspecified: Secondary | ICD-10-CM | POA: Diagnosis not present

## 2022-07-20 DIAGNOSIS — I10 Essential (primary) hypertension: Secondary | ICD-10-CM | POA: Diagnosis not present

## 2022-07-20 DIAGNOSIS — Z992 Dependence on renal dialysis: Secondary | ICD-10-CM | POA: Diagnosis not present

## 2022-07-20 DIAGNOSIS — R188 Other ascites: Secondary | ICD-10-CM | POA: Diagnosis not present

## 2022-07-20 DIAGNOSIS — E211 Secondary hyperparathyroidism, not elsewhere classified: Secondary | ICD-10-CM | POA: Diagnosis not present

## 2022-07-21 DIAGNOSIS — R1030 Lower abdominal pain, unspecified: Secondary | ICD-10-CM | POA: Diagnosis not present

## 2022-07-21 DIAGNOSIS — Z992 Dependence on renal dialysis: Secondary | ICD-10-CM | POA: Diagnosis not present

## 2022-07-21 DIAGNOSIS — N186 End stage renal disease: Secondary | ICD-10-CM | POA: Diagnosis not present

## 2022-07-21 DIAGNOSIS — D631 Anemia in chronic kidney disease: Secondary | ICD-10-CM | POA: Diagnosis not present

## 2022-07-22 DIAGNOSIS — N186 End stage renal disease: Secondary | ICD-10-CM | POA: Diagnosis not present

## 2022-07-22 DIAGNOSIS — Z992 Dependence on renal dialysis: Secondary | ICD-10-CM | POA: Diagnosis not present

## 2022-07-22 DIAGNOSIS — R1030 Lower abdominal pain, unspecified: Secondary | ICD-10-CM | POA: Diagnosis not present

## 2022-07-22 DIAGNOSIS — E039 Hypothyroidism, unspecified: Secondary | ICD-10-CM | POA: Diagnosis not present

## 2022-07-22 DIAGNOSIS — E8779 Other fluid overload: Secondary | ICD-10-CM | POA: Diagnosis not present

## 2022-07-22 DIAGNOSIS — I10 Essential (primary) hypertension: Secondary | ICD-10-CM | POA: Diagnosis not present

## 2022-07-22 DIAGNOSIS — R188 Other ascites: Secondary | ICD-10-CM | POA: Diagnosis not present

## 2022-07-23 DIAGNOSIS — N186 End stage renal disease: Secondary | ICD-10-CM | POA: Diagnosis not present

## 2022-07-23 DIAGNOSIS — Z992 Dependence on renal dialysis: Secondary | ICD-10-CM | POA: Diagnosis not present

## 2022-07-23 DIAGNOSIS — E8779 Other fluid overload: Secondary | ICD-10-CM | POA: Diagnosis not present

## 2022-07-24 DIAGNOSIS — N186 End stage renal disease: Secondary | ICD-10-CM | POA: Diagnosis not present

## 2022-07-24 DIAGNOSIS — Z992 Dependence on renal dialysis: Secondary | ICD-10-CM | POA: Diagnosis not present

## 2022-07-25 DIAGNOSIS — E8779 Other fluid overload: Secondary | ICD-10-CM | POA: Diagnosis not present

## 2022-07-25 DIAGNOSIS — N186 End stage renal disease: Secondary | ICD-10-CM | POA: Diagnosis not present

## 2022-07-25 DIAGNOSIS — Z992 Dependence on renal dialysis: Secondary | ICD-10-CM | POA: Diagnosis not present

## 2022-07-26 DIAGNOSIS — N186 End stage renal disease: Secondary | ICD-10-CM | POA: Diagnosis not present

## 2022-07-26 DIAGNOSIS — Z992 Dependence on renal dialysis: Secondary | ICD-10-CM | POA: Diagnosis not present

## 2022-07-26 DIAGNOSIS — E8779 Other fluid overload: Secondary | ICD-10-CM | POA: Diagnosis not present

## 2022-07-27 DIAGNOSIS — Z992 Dependence on renal dialysis: Secondary | ICD-10-CM | POA: Diagnosis not present

## 2022-07-27 DIAGNOSIS — E8779 Other fluid overload: Secondary | ICD-10-CM | POA: Diagnosis not present

## 2022-07-27 DIAGNOSIS — N186 End stage renal disease: Secondary | ICD-10-CM | POA: Diagnosis not present

## 2022-07-28 DIAGNOSIS — Z992 Dependence on renal dialysis: Secondary | ICD-10-CM | POA: Diagnosis not present

## 2022-07-28 DIAGNOSIS — E8779 Other fluid overload: Secondary | ICD-10-CM | POA: Diagnosis not present

## 2022-07-28 DIAGNOSIS — N186 End stage renal disease: Secondary | ICD-10-CM | POA: Diagnosis not present

## 2022-07-29 DIAGNOSIS — N186 End stage renal disease: Secondary | ICD-10-CM | POA: Diagnosis not present

## 2022-07-29 DIAGNOSIS — Z992 Dependence on renal dialysis: Secondary | ICD-10-CM | POA: Diagnosis not present

## 2022-07-29 DIAGNOSIS — E8779 Other fluid overload: Secondary | ICD-10-CM | POA: Diagnosis not present

## 2022-07-30 DIAGNOSIS — N186 End stage renal disease: Secondary | ICD-10-CM | POA: Diagnosis not present

## 2022-07-30 DIAGNOSIS — Z992 Dependence on renal dialysis: Secondary | ICD-10-CM | POA: Diagnosis not present

## 2022-07-30 DIAGNOSIS — E8779 Other fluid overload: Secondary | ICD-10-CM | POA: Diagnosis not present

## 2022-07-31 DIAGNOSIS — N186 End stage renal disease: Secondary | ICD-10-CM | POA: Diagnosis not present

## 2022-07-31 DIAGNOSIS — E8779 Other fluid overload: Secondary | ICD-10-CM | POA: Diagnosis not present

## 2022-07-31 DIAGNOSIS — Z992 Dependence on renal dialysis: Secondary | ICD-10-CM | POA: Diagnosis not present

## 2022-08-01 DIAGNOSIS — N186 End stage renal disease: Secondary | ICD-10-CM | POA: Diagnosis not present

## 2022-08-01 DIAGNOSIS — Z992 Dependence on renal dialysis: Secondary | ICD-10-CM | POA: Diagnosis not present

## 2022-08-01 DIAGNOSIS — E8779 Other fluid overload: Secondary | ICD-10-CM | POA: Diagnosis not present

## 2022-08-02 DIAGNOSIS — E8779 Other fluid overload: Secondary | ICD-10-CM | POA: Diagnosis not present

## 2022-08-02 DIAGNOSIS — Z992 Dependence on renal dialysis: Secondary | ICD-10-CM | POA: Diagnosis not present

## 2022-08-02 DIAGNOSIS — N186 End stage renal disease: Secondary | ICD-10-CM | POA: Diagnosis not present

## 2022-08-03 DIAGNOSIS — E8779 Other fluid overload: Secondary | ICD-10-CM | POA: Diagnosis not present

## 2022-08-03 DIAGNOSIS — Z992 Dependence on renal dialysis: Secondary | ICD-10-CM | POA: Diagnosis not present

## 2022-08-03 DIAGNOSIS — N186 End stage renal disease: Secondary | ICD-10-CM | POA: Diagnosis not present

## 2022-08-04 DIAGNOSIS — E8779 Other fluid overload: Secondary | ICD-10-CM | POA: Diagnosis not present

## 2022-08-04 DIAGNOSIS — Z992 Dependence on renal dialysis: Secondary | ICD-10-CM | POA: Diagnosis not present

## 2022-08-04 DIAGNOSIS — N186 End stage renal disease: Secondary | ICD-10-CM | POA: Diagnosis not present

## 2022-08-05 ENCOUNTER — Encounter: Payer: Self-pay | Admitting: Physician Assistant

## 2022-08-05 DIAGNOSIS — E8779 Other fluid overload: Secondary | ICD-10-CM | POA: Diagnosis not present

## 2022-08-05 DIAGNOSIS — N186 End stage renal disease: Secondary | ICD-10-CM | POA: Diagnosis not present

## 2022-08-05 DIAGNOSIS — Z992 Dependence on renal dialysis: Secondary | ICD-10-CM | POA: Diagnosis not present

## 2022-08-06 DIAGNOSIS — Z992 Dependence on renal dialysis: Secondary | ICD-10-CM | POA: Diagnosis not present

## 2022-08-06 DIAGNOSIS — E8779 Other fluid overload: Secondary | ICD-10-CM | POA: Diagnosis not present

## 2022-08-06 DIAGNOSIS — N186 End stage renal disease: Secondary | ICD-10-CM | POA: Diagnosis not present

## 2022-08-07 ENCOUNTER — Ambulatory Visit (INDEPENDENT_AMBULATORY_CARE_PROVIDER_SITE_OTHER): Payer: BC Managed Care – PPO

## 2022-08-07 ENCOUNTER — Encounter (INDEPENDENT_AMBULATORY_CARE_PROVIDER_SITE_OTHER): Payer: Self-pay | Admitting: Nurse Practitioner

## 2022-08-07 ENCOUNTER — Ambulatory Visit (INDEPENDENT_AMBULATORY_CARE_PROVIDER_SITE_OTHER): Payer: BC Managed Care – PPO | Admitting: Nurse Practitioner

## 2022-08-07 VITALS — BP 127/79 | HR 99 | Resp 18 | Ht 67.0 in | Wt 218.0 lb

## 2022-08-07 DIAGNOSIS — I10 Essential (primary) hypertension: Secondary | ICD-10-CM | POA: Diagnosis not present

## 2022-08-07 DIAGNOSIS — E8779 Other fluid overload: Secondary | ICD-10-CM | POA: Diagnosis not present

## 2022-08-07 DIAGNOSIS — N186 End stage renal disease: Secondary | ICD-10-CM | POA: Diagnosis not present

## 2022-08-07 DIAGNOSIS — Z992 Dependence on renal dialysis: Secondary | ICD-10-CM | POA: Diagnosis not present

## 2022-08-08 DIAGNOSIS — E8779 Other fluid overload: Secondary | ICD-10-CM | POA: Diagnosis not present

## 2022-08-08 DIAGNOSIS — N186 End stage renal disease: Secondary | ICD-10-CM | POA: Diagnosis not present

## 2022-08-08 DIAGNOSIS — Z992 Dependence on renal dialysis: Secondary | ICD-10-CM | POA: Diagnosis not present

## 2022-08-09 DIAGNOSIS — Z992 Dependence on renal dialysis: Secondary | ICD-10-CM | POA: Diagnosis not present

## 2022-08-09 DIAGNOSIS — E8779 Other fluid overload: Secondary | ICD-10-CM | POA: Diagnosis not present

## 2022-08-09 DIAGNOSIS — N186 End stage renal disease: Secondary | ICD-10-CM | POA: Diagnosis not present

## 2022-08-10 DIAGNOSIS — Z992 Dependence on renal dialysis: Secondary | ICD-10-CM | POA: Diagnosis not present

## 2022-08-10 DIAGNOSIS — E8779 Other fluid overload: Secondary | ICD-10-CM | POA: Diagnosis not present

## 2022-08-10 DIAGNOSIS — N186 End stage renal disease: Secondary | ICD-10-CM | POA: Diagnosis not present

## 2022-08-11 DIAGNOSIS — N186 End stage renal disease: Secondary | ICD-10-CM | POA: Diagnosis not present

## 2022-08-11 DIAGNOSIS — E8779 Other fluid overload: Secondary | ICD-10-CM | POA: Diagnosis not present

## 2022-08-11 DIAGNOSIS — Z992 Dependence on renal dialysis: Secondary | ICD-10-CM | POA: Diagnosis not present

## 2022-08-12 DIAGNOSIS — E8779 Other fluid overload: Secondary | ICD-10-CM | POA: Diagnosis not present

## 2022-08-12 DIAGNOSIS — N186 End stage renal disease: Secondary | ICD-10-CM | POA: Diagnosis not present

## 2022-08-12 DIAGNOSIS — Z992 Dependence on renal dialysis: Secondary | ICD-10-CM | POA: Diagnosis not present

## 2022-08-13 DIAGNOSIS — Z992 Dependence on renal dialysis: Secondary | ICD-10-CM | POA: Diagnosis not present

## 2022-08-13 DIAGNOSIS — N186 End stage renal disease: Secondary | ICD-10-CM | POA: Diagnosis not present

## 2022-08-13 DIAGNOSIS — E8779 Other fluid overload: Secondary | ICD-10-CM | POA: Diagnosis not present

## 2022-08-14 DIAGNOSIS — Z992 Dependence on renal dialysis: Secondary | ICD-10-CM | POA: Diagnosis not present

## 2022-08-14 DIAGNOSIS — E8779 Other fluid overload: Secondary | ICD-10-CM | POA: Diagnosis not present

## 2022-08-14 DIAGNOSIS — N186 End stage renal disease: Secondary | ICD-10-CM | POA: Diagnosis not present

## 2022-08-15 DIAGNOSIS — E8779 Other fluid overload: Secondary | ICD-10-CM | POA: Diagnosis not present

## 2022-08-15 DIAGNOSIS — N186 End stage renal disease: Secondary | ICD-10-CM | POA: Diagnosis not present

## 2022-08-15 DIAGNOSIS — Z992 Dependence on renal dialysis: Secondary | ICD-10-CM | POA: Diagnosis not present

## 2022-08-16 DIAGNOSIS — E8779 Other fluid overload: Secondary | ICD-10-CM | POA: Diagnosis not present

## 2022-08-16 DIAGNOSIS — N186 End stage renal disease: Secondary | ICD-10-CM | POA: Diagnosis not present

## 2022-08-16 DIAGNOSIS — Z992 Dependence on renal dialysis: Secondary | ICD-10-CM | POA: Diagnosis not present

## 2022-08-17 DIAGNOSIS — Z992 Dependence on renal dialysis: Secondary | ICD-10-CM | POA: Diagnosis not present

## 2022-08-17 DIAGNOSIS — E8779 Other fluid overload: Secondary | ICD-10-CM | POA: Diagnosis not present

## 2022-08-17 DIAGNOSIS — N186 End stage renal disease: Secondary | ICD-10-CM | POA: Diagnosis not present

## 2022-08-18 DIAGNOSIS — N186 End stage renal disease: Secondary | ICD-10-CM | POA: Diagnosis not present

## 2022-08-18 DIAGNOSIS — Z992 Dependence on renal dialysis: Secondary | ICD-10-CM | POA: Diagnosis not present

## 2022-08-18 DIAGNOSIS — E8779 Other fluid overload: Secondary | ICD-10-CM | POA: Diagnosis not present

## 2022-08-19 DIAGNOSIS — Z992 Dependence on renal dialysis: Secondary | ICD-10-CM | POA: Diagnosis not present

## 2022-08-19 DIAGNOSIS — N186 End stage renal disease: Secondary | ICD-10-CM | POA: Diagnosis not present

## 2022-08-19 DIAGNOSIS — E8779 Other fluid overload: Secondary | ICD-10-CM | POA: Diagnosis not present

## 2022-08-20 ENCOUNTER — Encounter (INDEPENDENT_AMBULATORY_CARE_PROVIDER_SITE_OTHER): Payer: Self-pay | Admitting: Nurse Practitioner

## 2022-08-20 DIAGNOSIS — E8779 Other fluid overload: Secondary | ICD-10-CM | POA: Diagnosis not present

## 2022-08-20 DIAGNOSIS — N186 End stage renal disease: Secondary | ICD-10-CM | POA: Diagnosis not present

## 2022-08-20 DIAGNOSIS — Z992 Dependence on renal dialysis: Secondary | ICD-10-CM | POA: Diagnosis not present

## 2022-08-20 NOTE — Progress Notes (Signed)
Subjective:    Patient ID: Holly [name], female    DOB: 03-13-1986, 36 y.o.   MRN: [id number] Chief Complaint  Patient presents with   Follow-up    ultrasound    The patient returns to the office for follow up regarding a problem with their dialysis access.  The patient has a left radiocephalic AV fistula with has not matured well and she is currently maintained via PermCath.  She does dialysis at home.  The patient denies redness or swelling at the access site. The patient denies fever or chills at home or while on dialysis.  No recent shortening of the patient's walking distance or new symptoms consistent with claudication.  No history of rest pain symptoms. No new ulcers or wounds of the lower extremities have occurred.  The patient denies amaurosis fugax or recent TIA symptoms. There are no recent neurological changes noted. There is no history of DVT, PE or superficial thrombophlebitis. No recent episodes of angina or shortness of breath documented.   Duplex ultrasound of the AV access shows a patent access.  The previously noted stenosis is significantly increased compared to last study.  Flow volume today is 259 cc/min (previous flow volume was 558 cc/min)    Review of Systems     Objective:   Physical Exam  BP 127/79 (BP Location: Right Arm)   Pulse 99   Resp 18   Ht '5\' 7"'$  (1.702 m)   Wt 218 lb (98.9 kg)   BMI 34.14 kg/m   Past Medical History:  Diagnosis Date   Anemia    Anxiety    Arthritis    hands   Blood transfusion without reported diagnosis    Cancer ([facility])    Complication of anesthesia    with Fentanyl she is difficult to awake   Depression    Dialysis patient ([name])    Dyspnea    due to anemia   Epilepsy ([name])    Hashimoto's disease    History of kidney stones    HLD (hyperlipidemia)    Hypertension    Hypothyroidism    Pneumonia    Renal disorder    Thyroid disease     Social History   Socioeconomic History   Marital status:  Significant Other    Spouse name: Holly [name]   Number of children: Not on file   Years of education: Not on file   Highest education level: Not on file  Occupational History   Not on file  Tobacco Use   Smoking status: Former    Types: Cigarettes   Smokeless tobacco: Never  Vaping Use   Vaping Use: Never used  Substance and Sexual Activity   Alcohol use: Never   Drug use: Never   Sexual activity: Not on file  Other Topics Concern   Not on file  Social History Narrative   Not on file   Social Determinants of Health   Financial Resource Strain: Not on file  Food Insecurity: Not on file  Transportation Needs: Not on file  Physical Activity: Not on file  Stress: Not on file  Social Connections: Not on file  Intimate Partner Violence: Not on file    Past Surgical History:  Procedure Laterality Date   ABDOMINAL HYSTERECTOMY     ABDOMINAL SURGERY     AV FISTULA PLACEMENT Left 01/17/2022   Procedure: ARTERIOVENOUS (AV) FISTULA CREATION ( RADIAL CEPHALIC);  Surgeon: Katha Cabal, MD;  Location: [facility] ORS;  Service: Vascular;  Laterality: Left;  CERVICAL BIOPSY  W/ LOOP ELECTRODE EXCISION  2018   COLONOSCOPY N/A 01/30/2022   Procedure: COLONOSCOPY;  Surgeon: Annamaria Helling, DO;  Location: [facility] ENDOSCOPY;  Service: Gastroenterology;  Laterality: N/A;   COLONOSCOPY WITH PROPOFOL N/A 02/01/2022   Procedure: COLONOSCOPY WITH PROPOFOL;  Surgeon: Lesly Rubenstein, MD;  Location: [facility] ENDOSCOPY;  Service: Endoscopy;  Laterality: N/A;   DIAGNOSTIC LAPAROSCOPY     due to removal of PD catheters   DIALYSIS/PERMA CATHETER INSERTION N/A 06/09/2022   Procedure: DIALYSIS/PERMA CATHETER INSERTION;  Surgeon: Algernon Huxley, MD;  Location: [place] CV LAB;  Service: Cardiovascular;  Laterality: N/A;   DIALYSIS/PERMA CATHETER INSERTION N/A 06/30/2022   Procedure: DIALYSIS/PERMA CATHETER INSERTION;  Surgeon: Marty Heck, MD;  Location: [place] CV LAB;  Service:  Cardiovascular;  Laterality: N/A;   ESOPHAGOGASTRODUODENOSCOPY N/A 01/30/2022   Procedure: ESOPHAGOGASTRODUODENOSCOPY (EGD);  Surgeon: Annamaria Helling, DO;  Location: [facility] ENDOSCOPY;  Service: Gastroenterology;  Laterality: N/A;   ESOPHAGOGASTRODUODENOSCOPY (EGD) WITH PROPOFOL N/A 02/01/2022   Procedure: ESOPHAGOGASTRODUODENOSCOPY (EGD) WITH PROPOFOL;  Surgeon: Lesly Rubenstein, MD;  Location: [facility] ENDOSCOPY;  Service: Endoscopy;  Laterality: N/A;   RENAL BIOPSY     x 2   TUMOR REMOVAL     right face   UTERINE ARTERY EMBOLIZATION  05/04/2022    Family History  Problem Relation Age of Onset   Multiple sclerosis Mother    Cancer Father    COPD Father     Allergies  Allergen Reactions   Rituximab Anaphylaxis and Swelling       Latest Ref Rng & Units 06/30/2022   12:17 PM 05/29/2022    1:14 PM 05/22/2022   10:05 AM  CBC  WBC 4.0 - 10.5 K/uL 7.2  8.1  7.7   Hemoglobin 12.0 - 15.0 g/dL 6.8  7.1  6.7   Hematocrit 36.0 - 46.0 % 19.9  20.6  18.8   Platelets 150 - 400 K/uL 205  180  154       CMP     Component Value Date/Time   NA 132 (L) 06/30/2022 1217   K 4.6 06/30/2022 1217   CL 89 (L) 06/30/2022 1217   CO2 25 06/30/2022 1217   GLUCOSE 111 (H) 06/30/2022 1217   BUN 64 (H) 06/30/2022 1217   CREATININE 12.87 (H) 06/30/2022 1217   CREATININE 9.97 (HH) 03/31/2022 1310   CALCIUM 9.6 06/30/2022 1217   PROT 6.1 (L) 06/30/2022 1217   ALBUMIN 3.3 (L) 06/30/2022 1217   AST 9 (L) 06/30/2022 1217   AST 9 (L) 03/31/2022 1310   ALT <5 06/30/2022 1217   ALT 13 03/31/2022 1310   ALKPHOS 66 06/30/2022 1217   BILITOT 0.4 06/30/2022 1217   BILITOT 0.3 03/31/2022 1310   GFRNONAA 4 (L) 06/30/2022 1217   GFRNONAA 5 (L) 03/31/2022 1310   GFRAA 3 (L) 02/19/2020 2302     No results found.     Assessment & Plan:   1. ESRD (end stage renal disease) ([place]) Recommend:  The patient is experiencing increasing problems with their dialysis access.  Patient should have a  fistulagram with the intention for intervention.  The intention for intervention is to restore appropriate flow and prevent thrombosis and possible loss of the access.  As well as improve the quality of dialysis therapy.  The risks, benefits and alternative therapies were reviewed in detail with the patient.  All questions were answered.  The patient agrees to proceed with angio/intervention.    The  patient will follow up with me in the office after the procedure.   2. Essential hypertension Continue antihypertensive medications as already ordered, these medications have been reviewed and there are no changes at this time.   Current Outpatient Medications on File Prior to Visit  Medication Sig Dispense Refill   acetaminophen (TYLENOL) 500 MG tablet Take 1,000 mg by mouth every 6 (six) hours as needed for headache (pain).     acyclovir (ZOVIRAX) 200 MG capsule Take 200 mg by mouth 2 (two) times daily as needed (out breaks).     albuterol (VENTOLIN HFA) 108 (90 Base) MCG/ACT inhaler Inhale 2-4 puffs into the lungs every 4 (four) hours as needed for wheezing (or cough). 18 g 0   amLODipine (NORVASC) 10 MG tablet Take 1 tablet (10 mg total) by mouth daily. 30 tablet 0   carvedilol (COREG) 12.5 MG tablet Take 12.5 mg by mouth 2 (two) times daily with a meal.     cetirizine (ZYRTEC) 10 MG tablet Take 10 mg by mouth at bedtime.     cinacalcet (SENSIPAR) 60 MG tablet Take 60 mg by mouth at bedtime.     diclofenac Sodium (VOLTAREN) 1 % GEL Apply 1 application  topically 4 (four) times daily as needed (hand,feet,thighs and back pain).     gabapentin (NEURONTIN) 100 MG capsule Take 100 mg by mouth 3 (three) times daily.     levothyroxine (SYNTHROID) 200 MCG tablet Take 200 mcg by mouth daily before breakfast.     megestrol (MEGACE) 40 MG tablet Take 2 tablets (80 mg total) by mouth daily. 30 tablet 0   ondansetron (ZOFRAN-ODT) 4 MG disintegrating tablet Take 4 mg by mouth every 8 (eight) hours as needed  for nausea or vomiting.     oxyCODONE (OXY IR/ROXICODONE) 5 MG immediate release tablet Take 5 mg by mouth every 6 (six) hours as needed for moderate pain or severe pain.     VELPHORO 500 MG chewable tablet Chew 500 mg by mouth 3 (three) times daily with meals.     No current facility-administered medications on file prior to visit.    There are no Patient Instructions on file for this visit. No follow-ups on file.   Kris Hartmann, NP

## 2022-08-21 ENCOUNTER — Encounter: Payer: Self-pay | Admitting: Family Medicine

## 2022-08-21 ENCOUNTER — Ambulatory Visit (INDEPENDENT_AMBULATORY_CARE_PROVIDER_SITE_OTHER): Payer: BC Managed Care – PPO

## 2022-08-21 ENCOUNTER — Telehealth: Payer: Self-pay

## 2022-08-21 ENCOUNTER — Ambulatory Visit (INDEPENDENT_AMBULATORY_CARE_PROVIDER_SITE_OTHER): Payer: BC Managed Care – PPO | Admitting: Family Medicine

## 2022-08-21 VITALS — BP 138/80 | HR 95 | Temp 97.8°F | Ht 67.0 in | Wt 221.0 lb

## 2022-08-21 DIAGNOSIS — J4520 Mild intermittent asthma, uncomplicated: Secondary | ICD-10-CM | POA: Diagnosis not present

## 2022-08-21 DIAGNOSIS — R509 Fever, unspecified: Secondary | ICD-10-CM | POA: Diagnosis not present

## 2022-08-21 DIAGNOSIS — R058 Other specified cough: Secondary | ICD-10-CM

## 2022-08-21 DIAGNOSIS — N186 End stage renal disease: Secondary | ICD-10-CM

## 2022-08-21 DIAGNOSIS — F419 Anxiety disorder, unspecified: Secondary | ICD-10-CM

## 2022-08-21 DIAGNOSIS — E039 Hypothyroidism, unspecified: Secondary | ICD-10-CM

## 2022-08-21 DIAGNOSIS — Z992 Dependence on renal dialysis: Secondary | ICD-10-CM

## 2022-08-21 DIAGNOSIS — R059 Cough, unspecified: Secondary | ICD-10-CM | POA: Diagnosis not present

## 2022-08-21 DIAGNOSIS — R0602 Shortness of breath: Secondary | ICD-10-CM | POA: Diagnosis not present

## 2022-08-21 DIAGNOSIS — I10 Essential (primary) hypertension: Secondary | ICD-10-CM

## 2022-08-21 DIAGNOSIS — Z7689 Persons encountering health services in other specified circumstances: Secondary | ICD-10-CM | POA: Diagnosis not present

## 2022-08-21 DIAGNOSIS — F32A Depression, unspecified: Secondary | ICD-10-CM

## 2022-08-21 DIAGNOSIS — R062 Wheezing: Secondary | ICD-10-CM | POA: Diagnosis not present

## 2022-08-21 DIAGNOSIS — E8779 Other fluid overload: Secondary | ICD-10-CM | POA: Diagnosis not present

## 2022-08-21 LAB — CBC WITH DIFFERENTIAL/PLATELET
Basophils Absolute: 0.1 10*3/uL (ref 0.0–0.1)
Basophils Relative: 1.3 % (ref 0.0–3.0)
Eosinophils Absolute: 0.4 10*3/uL (ref 0.0–0.7)
Eosinophils Relative: 8.3 % — ABNORMAL HIGH (ref 0.0–5.0)
HCT: 17.2 % — CL (ref 36.0–46.0)
Hemoglobin: 6 g/dL — CL (ref 12.0–15.0)
Lymphocytes Relative: 7.8 % — ABNORMAL LOW (ref 12.0–46.0)
Lymphs Abs: 0.4 10*3/uL — ABNORMAL LOW (ref 0.7–4.0)
MCHC: 34.6 g/dL (ref 30.0–36.0)
MCV: 90.5 fl (ref 78.0–100.0)
Monocytes Absolute: 0.5 10*3/uL (ref 0.1–1.0)
Monocytes Relative: 8.7 % (ref 3.0–12.0)
Neutro Abs: 3.9 10*3/uL (ref 1.4–7.7)
Neutrophils Relative %: 73.9 % (ref 43.0–77.0)
Platelets: 169 10*3/uL (ref 150.0–400.0)
RBC: 1.91 Mil/uL — ABNORMAL LOW (ref 3.87–5.11)
RDW: 15.4 % (ref 11.5–15.5)
WBC: 5.3 10*3/uL (ref 4.0–10.5)

## 2022-08-21 LAB — POCT INFLUENZA A/B
Influenza A, POC: NEGATIVE
Influenza B, POC: NEGATIVE

## 2022-08-21 LAB — POC COVID19 BINAXNOW: SARS Coronavirus 2 Ag: NEGATIVE

## 2022-08-21 LAB — POCT RESPIRATORY SYNCYTIAL VIRUS: RSV Rapid Ag: NEGATIVE

## 2022-08-21 MED ORDER — CEFDINIR 125 MG/5ML PO SUSR: 125.0000 mg | Freq: Two times a day (BID) | ORAL | 0 refills | Status: DC

## 2022-08-21 MED ORDER — METHYLPREDNISOLONE ACETATE 80 MG/ML IJ SUSP
80.0000 mg | Freq: Once | INTRAMUSCULAR | Status: AC
  Administered 2022-08-21: 80 mg via INTRAMUSCULAR

## 2022-08-21 NOTE — Telephone Encounter (Signed)
LM for pt to return my call

## 2022-08-21 NOTE — Patient Instructions (Addendum)
You received methylprednisone 80 mg IM injection in the office today.  This is a steroid.  Start the antibiotic as prescribed.  Take this for 7 days.  Follow-up with me next week.  If you are getting worse over the weekend then you will need to be seen at an urgent care or emergency department.  You can call to schedule your appointment with a psychiatrist.  A few offices are listed below for you to call.      [facility] [address] [place], [place] [address]  Phone: [place] [facility]  [address], [place], [place] [address]  Phone: [phone]

## 2022-08-21 NOTE — Progress Notes (Signed)
Subjective:  Holly [name] is a 36 y.o. female who presents to establish care.   Moved from [place] in 2019.   Anxiety and depression- Effexor and Valium in the past  Hx of suicidal attempts   She is establishing care with me today.   Other providers: Nephrologist-   [facility] Hematology - anemia of chronic disease   Recent hysterectomy   Does home hemodialysis now. ESRD.   Recently smoked smoking   Symptoms include a 1 wk hx of fever, chills, body aches, fatigue, nasal congestion, rhinorrhea, productive cough, wheezing and shortness of breath.   She has underlying asthma. Using albuterol inhaler.   No nausea, vomiting or diarrhea.        08/21/2022   10:51 AM  Depression screen PHQ 2/9  Decreased Interest 2  Down, Depressed, Hopeless 3  PHQ - 2 Score 5  Altered sleeping 2  Tired, decreased energy 3  Change in appetite 3  Feeling bad or failure about yourself  2  Trouble concentrating 2  Moving slowly or fidgety/restless 3  Suicidal thoughts 0  PHQ-9 Score 20  Difficult doing work/chores Very difficult     ROS as in subjective.   Objective: Vitals:   08/21/22 1046  BP: 138/80  Pulse: 95  Temp: 97.8 F (36.6 C)  SpO2: 98%    General appearance: Alert, WD/WN, no distress, mildly ill appearing                             Skin: warm, no rash                           Head: no sinus tenderness                            Eyes: conjunctiva normal, corneas clear, PERRLA                            Ears: pearly TMs, external ear canals normal                                                 Neck: supple, no adenopathy, no thyromegaly, nontender                          Heart: RRR                         Lungs: scattered expiratory wheezes, no rales, or rhonchi      Assessment: Productive cough - Plan: CBC with Differential/Platelet, DG Chest 2 View, POC COVID-19, POCT Influenza A/B, POCT respiratory syncytial virus, CBC with  Differential/Platelet, cefdinir (OMNICEF) 125 MG/5ML suspension, methylPREDNISolone acetate (DEPO-MEDROL) injection 80 mg  Encounter to establish care  Anxiety and depression - Plan: Ambulatory referral to Psychiatry  Mild intermittent asthma without complication - Plan: methylPREDNISolone acetate (DEPO-MEDROL) injection 80 mg  Shortness of breath - Plan: POC COVID-19, POCT Influenza A/B, POCT respiratory syncytial virus, methylPREDNISolone acetate (DEPO-MEDROL) injection 80 mg  ESRD on dialysis ([place])  Essential hypertension  Hypothyroidism, unspecified type - Plan: TSH, TSH  Fever and chills - Plan: CBC with  Differential/Platelet, DG Chest 2 View, POC COVID-19, POCT Influenza A/B, POCT respiratory syncytial virus, CBC with Differential/Platelet, cefdinir (OMNICEF) 125 MG/5ML suspension, methylPREDNISolone acetate (DEPO-MEDROL) injection 80 mg   Plan: Here to establish care.  Negative Covid, flu and RSV 1 wk hx of viral URI and now having shortness of breath with yellow sputum. Stat CXR and CBC ordered.  Underlying asthma and using albuterol often.   CXR negative for pneumonia or fluid overload.  Steroid injection given in office. Omnicef low dose prescribed.  Discussed symptomatic management.  Follow up pending labs.   Psych referral for anxiety and depression with hx of SI  Follow up here next week.

## 2022-08-21 NOTE — Telephone Encounter (Signed)
CRITICAL VALUE STICKER  CRITICAL VALUE: Hemoglobin 6.0 and Hematocrit 17.2  RECEIVER (on-site recipient of call): Jarrett Soho  DATE & TIME NOTIFIED: 08/21/22 11:58 am  MESSENGER (representative from lab): Santiago Glad  MD NOTIFIED: Harland Dingwall

## 2022-08-22 DIAGNOSIS — Z992 Dependence on renal dialysis: Secondary | ICD-10-CM | POA: Diagnosis not present

## 2022-08-22 DIAGNOSIS — E8779 Other fluid overload: Secondary | ICD-10-CM | POA: Diagnosis not present

## 2022-08-22 DIAGNOSIS — N186 End stage renal disease: Secondary | ICD-10-CM | POA: Diagnosis not present

## 2022-08-22 LAB — TSH: TSH: 13.89 u[IU]/mL — ABNORMAL HIGH (ref 0.35–5.50)

## 2022-08-22 NOTE — Progress Notes (Signed)
Her TSH is high. We will recheck this and discuss it next week at her follow up. Did she do something about her severe anemia? Please check

## 2022-08-23 DIAGNOSIS — N186 End stage renal disease: Secondary | ICD-10-CM | POA: Diagnosis not present

## 2022-08-23 DIAGNOSIS — Z992 Dependence on renal dialysis: Secondary | ICD-10-CM | POA: Diagnosis not present

## 2022-08-23 DIAGNOSIS — E8779 Other fluid overload: Secondary | ICD-10-CM | POA: Diagnosis not present

## 2022-08-24 ENCOUNTER — Other Ambulatory Visit: Payer: Self-pay

## 2022-08-24 ENCOUNTER — Observation Stay (HOSPITAL_COMMUNITY)
Admission: EM | Admit: 2022-08-24 | Discharge: 2022-08-26 | Disposition: A | Discharge status: Home / Self Care | Payer: BC Managed Care – PPO | Attending: Internal Medicine | Admitting: Internal Medicine

## 2022-08-24 DIAGNOSIS — N186 End stage renal disease: Secondary | ICD-10-CM | POA: Diagnosis not present

## 2022-08-24 DIAGNOSIS — Z79899 Other long term (current) drug therapy: Secondary | ICD-10-CM | POA: Insufficient documentation

## 2022-08-24 DIAGNOSIS — E8779 Other fluid overload: Secondary | ICD-10-CM | POA: Diagnosis not present

## 2022-08-24 DIAGNOSIS — N83201 Unspecified ovarian cyst, right side: Secondary | ICD-10-CM | POA: Diagnosis not present

## 2022-08-24 DIAGNOSIS — I120 Hypertensive chronic kidney disease with stage 5 chronic kidney disease or end stage renal disease: Principal | ICD-10-CM | POA: Insufficient documentation

## 2022-08-24 DIAGNOSIS — E039 Hypothyroidism, unspecified: Secondary | ICD-10-CM | POA: Insufficient documentation

## 2022-08-24 DIAGNOSIS — N83202 Unspecified ovarian cyst, left side: Secondary | ICD-10-CM | POA: Diagnosis not present

## 2022-08-24 DIAGNOSIS — I10 Essential (primary) hypertension: Secondary | ICD-10-CM | POA: Diagnosis present

## 2022-08-24 DIAGNOSIS — D649 Anemia, unspecified: Secondary | ICD-10-CM | POA: Diagnosis present

## 2022-08-24 DIAGNOSIS — M546 Pain in thoracic spine: Secondary | ICD-10-CM | POA: Diagnosis not present

## 2022-08-24 DIAGNOSIS — Z87891 Personal history of nicotine dependence: Secondary | ICD-10-CM | POA: Insufficient documentation

## 2022-08-24 DIAGNOSIS — D631 Anemia in chronic kidney disease: Secondary | ICD-10-CM | POA: Insufficient documentation

## 2022-08-24 DIAGNOSIS — N9489 Other specified conditions associated with female genital organs and menstrual cycle: Secondary | ICD-10-CM

## 2022-08-24 DIAGNOSIS — Z992 Dependence on renal dialysis: Secondary | ICD-10-CM | POA: Insufficient documentation

## 2022-08-24 DIAGNOSIS — D638 Anemia in other chronic diseases classified elsewhere: Secondary | ICD-10-CM | POA: Diagnosis present

## 2022-08-24 DIAGNOSIS — Z859 Personal history of malignant neoplasm, unspecified: Secondary | ICD-10-CM | POA: Diagnosis not present

## 2022-08-24 DIAGNOSIS — R109 Unspecified abdominal pain: Secondary | ICD-10-CM | POA: Diagnosis not present

## 2022-08-24 LAB — CBC WITH DIFFERENTIAL/PLATELET
Abs Immature Granulocytes: 0.03 10*3/uL (ref 0.00–0.07)
Basophils Absolute: 0 10*3/uL (ref 0.0–0.1)
Basophils Relative: 1 %
Eosinophils Absolute: 0.4 10*3/uL (ref 0.0–0.5)
Eosinophils Relative: 5 %
HCT: 16.1 % — ABNORMAL LOW (ref 36.0–46.0)
Hemoglobin: 5.4 g/dL — CL (ref 12.0–15.0)
Immature Granulocytes: 0 %
Lymphocytes Relative: 9 %
Lymphs Abs: 0.7 10*3/uL (ref 0.7–4.0)
MCH: 31.4 pg (ref 26.0–34.0)
MCHC: 33.5 g/dL (ref 30.0–36.0)
MCV: 93.6 fL (ref 80.0–100.0)
Monocytes Absolute: 0.5 10*3/uL (ref 0.1–1.0)
Monocytes Relative: 7 %
Neutro Abs: 6 10*3/uL (ref 1.7–7.7)
Neutrophils Relative %: 78 %
Platelets: 209 10*3/uL (ref 150–400)
RBC: 1.72 MIL/uL — ABNORMAL LOW (ref 3.87–5.11)
RDW: 14.7 % (ref 11.5–15.5)
WBC: 7.7 10*3/uL (ref 4.0–10.5)
nRBC: 0 % (ref 0.0–0.2)

## 2022-08-24 LAB — COMPREHENSIVE METABOLIC PANEL
ALT: 11 U/L (ref 0–44)
AST: 10 U/L — ABNORMAL LOW (ref 15–41)
Albumin: 3.6 g/dL (ref 3.5–5.0)
Alkaline Phosphatase: 63 U/L (ref 38–126)
Anion gap: 16 — ABNORMAL HIGH (ref 5–15)
BUN: 50 mg/dL — ABNORMAL HIGH (ref 6–20)
CO2: 23 mmol/L (ref 22–32)
Calcium: 9.4 mg/dL (ref 8.9–10.3)
Chloride: 92 mmol/L — ABNORMAL LOW (ref 98–111)
Creatinine, Ser: 11.83 mg/dL — ABNORMAL HIGH (ref 0.44–1.00)
GFR, Estimated: 4 mL/min — ABNORMAL LOW (ref >60)
Glucose, Bld: 91 mg/dL (ref 70–99)
Potassium: 4.9 mmol/L (ref 3.5–5.1)
Sodium: 131 mmol/L — ABNORMAL LOW (ref 135–145)
Total Bilirubin: 0.6 mg/dL (ref 0.3–1.2)
Total Protein: 6.9 g/dL (ref 6.5–8.1)

## 2022-08-24 LAB — LIPASE, BLOOD: Lipase: 32 U/L (ref 11–51)

## 2022-08-24 MED ORDER — OXYCODONE-ACETAMINOPHEN 5-325 MG PO TABS
1.0000 | ORAL_TABLET | Freq: Once | ORAL | Status: AC
  Administered 2022-08-24: 1 via ORAL
  Filled 2022-08-24: qty 1

## 2022-08-24 MED ORDER — SODIUM CHLORIDE 0.9% IV SOLUTION: Freq: Once | INTRAVENOUS | Status: DC

## 2022-08-24 NOTE — ED Triage Notes (Signed)
Patient arrived with EMS from home reports left flank pain this morning , no hematuria or dysuria . , mild nausea .

## 2022-08-24 NOTE — ED Provider Triage Note (Signed)
  Emergency Medicine Provider Triage Evaluation Note  MRN:  [id number]  Arrival date & time: 08/24/22    Medically screening exam initiated at 10:52 PM.   CC:   Flank Pain    HPI:  Holly [name] is a 36 y.o. year-old female presents to the ED with chief complaint of left flank pain.  Onset 2 hours ago.  Has had URI symptoms.  Does daily hemodialysis at home.   History provided by patient. ROS:  -As included in HPI PE:  There were no vitals filed for this visit.  Uncomfortable appearing No respiratory distress  MDM:  Abdominal pain and flank pain I've ordered labs and imaging in triage to expedite lab/diagnostic workup.  Patient was informed that the remainder of the evaluation will be completed by another provider, this initial triage assessment does not replace that evaluation, and the importance of remaining in the ED until their evaluation is complete.

## 2022-08-25 ENCOUNTER — Emergency Department (HOSPITAL_COMMUNITY): Payer: BC Managed Care – PPO

## 2022-08-25 ENCOUNTER — Encounter: Payer: Self-pay | Admitting: Family Medicine

## 2022-08-25 DIAGNOSIS — N83201 Unspecified ovarian cyst, right side: Secondary | ICD-10-CM | POA: Diagnosis not present

## 2022-08-25 DIAGNOSIS — D631 Anemia in chronic kidney disease: Secondary | ICD-10-CM | POA: Diagnosis not present

## 2022-08-25 DIAGNOSIS — N3289 Other specified disorders of bladder: Secondary | ICD-10-CM | POA: Diagnosis not present

## 2022-08-25 DIAGNOSIS — I120 Hypertensive chronic kidney disease with stage 5 chronic kidney disease or end stage renal disease: Secondary | ICD-10-CM | POA: Diagnosis not present

## 2022-08-25 DIAGNOSIS — E039 Hypothyroidism, unspecified: Secondary | ICD-10-CM | POA: Diagnosis not present

## 2022-08-25 DIAGNOSIS — Z992 Dependence on renal dialysis: Secondary | ICD-10-CM | POA: Diagnosis not present

## 2022-08-25 DIAGNOSIS — Z87891 Personal history of nicotine dependence: Secondary | ICD-10-CM | POA: Diagnosis not present

## 2022-08-25 DIAGNOSIS — Z859 Personal history of malignant neoplasm, unspecified: Secondary | ICD-10-CM | POA: Diagnosis not present

## 2022-08-25 DIAGNOSIS — Z79899 Other long term (current) drug therapy: Secondary | ICD-10-CM | POA: Diagnosis not present

## 2022-08-25 DIAGNOSIS — N83202 Unspecified ovarian cyst, left side: Secondary | ICD-10-CM | POA: Diagnosis not present

## 2022-08-25 DIAGNOSIS — R109 Unspecified abdominal pain: Secondary | ICD-10-CM | POA: Diagnosis not present

## 2022-08-25 DIAGNOSIS — N200 Calculus of kidney: Secondary | ICD-10-CM | POA: Diagnosis not present

## 2022-08-25 DIAGNOSIS — D649 Anemia, unspecified: Secondary | ICD-10-CM | POA: Diagnosis present

## 2022-08-25 DIAGNOSIS — N261 Atrophy of kidney (terminal): Secondary | ICD-10-CM | POA: Diagnosis not present

## 2022-08-25 DIAGNOSIS — K7689 Other specified diseases of liver: Secondary | ICD-10-CM | POA: Diagnosis not present

## 2022-08-25 DIAGNOSIS — N186 End stage renal disease: Secondary | ICD-10-CM | POA: Diagnosis not present

## 2022-08-25 LAB — PREPARE RBC (CROSSMATCH)

## 2022-08-25 LAB — I-STAT BETA HCG BLOOD, ED (MC, WL, AP ONLY): I-stat hCG, quantitative: 59.5 m[IU]/mL — ABNORMAL HIGH (ref <5)

## 2022-08-25 LAB — HCG, QUANTITATIVE, PREGNANCY: hCG, Beta Chain, Quant, S: 7 m[IU]/mL — ABNORMAL HIGH (ref <5)

## 2022-08-25 LAB — HEMOGLOBIN AND HEMATOCRIT, BLOOD
HCT: 16.2 % — ABNORMAL LOW (ref 36.0–46.0)
Hemoglobin: 5.2 g/dL — CL (ref 12.0–15.0)

## 2022-08-25 MED ORDER — GABAPENTIN 100 MG PO CAPS
100.0000 mg | ORAL_CAPSULE | Freq: Three times a day (TID) | ORAL | Status: DC
  Administered 2022-08-25 – 2022-08-26 (×2): 100 mg via ORAL
  Filled 2022-08-25 (×2): qty 1

## 2022-08-25 MED ORDER — ALBUTEROL SULFATE HFA 108 (90 BASE) MCG/ACT IN AERS: 2.0000 | INHALATION_SPRAY | RESPIRATORY_TRACT | Status: DC | PRN

## 2022-08-25 MED ORDER — BISACODYL 5 MG PO TBEC: 5.0000 mg | DELAYED_RELEASE_TABLET | Freq: Every day | ORAL | Status: DC | PRN

## 2022-08-25 MED ORDER — LEVOTHYROXINE SODIUM 100 MCG PO TABS
200.0000 ug | ORAL_TABLET | Freq: Every day | ORAL | Status: DC
  Administered 2022-08-26: 200 ug via ORAL
  Filled 2022-08-25: qty 2

## 2022-08-25 MED ORDER — HYDROMORPHONE HCL 1 MG/ML IJ SOLN
1.0000 mg | Freq: Once | INTRAMUSCULAR | Status: AC
  Administered 2022-08-25: 1 mg via INTRAVENOUS
  Filled 2022-08-25: qty 1

## 2022-08-25 MED ORDER — ACETAMINOPHEN 650 MG RE SUPP: 650.0000 mg | Freq: Four times a day (QID) | RECTAL | Status: DC | PRN

## 2022-08-25 MED ORDER — ONDANSETRON HCL 4 MG/2ML IJ SOLN
4.0000 mg | Freq: Once | INTRAMUSCULAR | Status: AC
  Administered 2022-08-25: 4 mg via INTRAVENOUS
  Filled 2022-08-25: qty 2

## 2022-08-25 MED ORDER — ACETAMINOPHEN 500 MG PO TABS: 1000.0000 mg | ORAL_TABLET | Freq: Four times a day (QID) | ORAL | Status: DC | PRN

## 2022-08-25 MED ORDER — ACETAMINOPHEN 325 MG PO TABS: 650.0000 mg | ORAL_TABLET | Freq: Four times a day (QID) | ORAL | Status: DC | PRN

## 2022-08-25 MED ORDER — AMLODIPINE BESYLATE 5 MG PO TABS
10.0000 mg | ORAL_TABLET | Freq: Every day | ORAL | Status: DC
  Administered 2022-08-25 – 2022-08-26 (×2): 10 mg via ORAL
  Filled 2022-08-25 (×2): qty 2

## 2022-08-25 MED ORDER — OXYCODONE HCL 5 MG PO TABS
5.0000 mg | ORAL_TABLET | Freq: Once | ORAL | Status: AC
  Administered 2022-08-25: 5 mg via ORAL
  Filled 2022-08-25: qty 1

## 2022-08-25 MED ORDER — SODIUM CHLORIDE 0.9% IV SOLUTION: Freq: Once | INTRAVENOUS | Status: DC

## 2022-08-25 MED ORDER — LORATADINE 10 MG PO TABS
10.0000 mg | ORAL_TABLET | Freq: Every day | ORAL | Status: DC
  Administered 2022-08-25 – 2022-08-26 (×2): 10 mg via ORAL
  Filled 2022-08-25 (×2): qty 1

## 2022-08-25 MED ORDER — CHLORHEXIDINE GLUCONATE CLOTH 2 % EX PADS: 6.0000 | MEDICATED_PAD | Freq: Every day | CUTANEOUS | Status: DC

## 2022-08-25 MED ORDER — CINACALCET HCL 30 MG PO TABS: 60.0000 mg | ORAL_TABLET | Freq: Every day | ORAL | Status: DC

## 2022-08-25 MED ORDER — OXYCODONE HCL 5 MG PO TABS
5.0000 mg | ORAL_TABLET | ORAL | Status: DC | PRN
  Administered 2022-08-25 – 2022-08-26 (×5): 5 mg via ORAL
  Filled 2022-08-25 (×5): qty 1

## 2022-08-25 MED ORDER — HYDROMORPHONE HCL 1 MG/ML IJ SOLN
0.5000 mg | INTRAMUSCULAR | Status: DC | PRN
  Administered 2022-08-25: 0.5 mg via INTRAVENOUS
  Filled 2022-08-25: qty 1

## 2022-08-25 MED ORDER — CEFDINIR 250 MG/5ML PO SUSR
125.0000 mg | Freq: Two times a day (BID) | ORAL | Status: DC
  Administered 2022-08-25: 125 mg via ORAL
  Filled 2022-08-25 (×3): qty 2.5
  Filled 2022-08-25: qty 5

## 2022-08-25 MED ORDER — ONDANSETRON 4 MG PO TBDP
4.0000 mg | ORAL_TABLET | Freq: Three times a day (TID) | ORAL | Status: DC | PRN
  Administered 2022-08-26: 4 mg via ORAL
  Filled 2022-08-25: qty 1

## 2022-08-25 MED ORDER — CARVEDILOL 12.5 MG PO TABS
12.5000 mg | ORAL_TABLET | Freq: Two times a day (BID) | ORAL | Status: DC
  Administered 2022-08-25: 12.5 mg via ORAL
  Filled 2022-08-25: qty 1

## 2022-08-25 MED ORDER — SUCROFERRIC OXYHYDROXIDE 500 MG PO CHEW
500.0000 mg | CHEWABLE_TABLET | Freq: Three times a day (TID) | ORAL | Status: DC
  Filled 2022-08-25 (×4): qty 1

## 2022-08-25 MED ORDER — ALBUTEROL SULFATE (2.5 MG/3ML) 0.083% IN NEBU: 2.5000 mg | INHALATION_SOLUTION | RESPIRATORY_TRACT | Status: DC | PRN

## 2022-08-25 MED ORDER — SENNOSIDES-DOCUSATE SODIUM 8.6-50 MG PO TABS: 1.0000 | ORAL_TABLET | Freq: Every evening | ORAL | Status: DC | PRN

## 2022-08-25 NOTE — ED Notes (Signed)
Pt states she makes very little urine  

## 2022-08-25 NOTE — ED Notes (Signed)
Called [phone] [employer] for page to [email or web address] on call

## 2022-08-25 NOTE — ED Provider Notes (Signed)
[facility] EMERGENCY DEPARTMENT Provider Note   CSN: [id number] Arrival date & time: 08/24/22  2240     History  Chief Complaint  Patient presents with   Flank Pain     Holly [name] is a 37 y.o. female.  37 year old female with past medical history of hysterectomy (06/24/2022, vaginal, uterus only, developed seroma post op), on home dialysis through right chest catheter (nightly, has missed the past 2 nights), HTN, renal disorder, epilepsy, anemia, presents with complaint of left flank pain onset Sunday, associated with nausea, loose stools.  Denies fevers, chills.  Pain is worse with taking a deep breath, constant.       Home Medications Prior to Admission medications   Medication Sig Start Date End Date Taking? Authorizing Provider  acetaminophen (TYLENOL) 500 MG tablet Take 1,000 mg by mouth every 6 (six) hours as needed for headache (pain).    [provider]  acyclovir (ZOVIRAX) 200 MG capsule Take 200 mg by mouth 2 (two) times daily as needed (out breaks). 08/14/21   [provider]  albuterol (VENTOLIN HFA) 108 (90 Base) MCG/ACT inhaler Inhale 2-4 puffs into the lungs every 4 (four) hours as needed for wheezing (or cough). 04/11/22   Lilland, Alana, DO  amLODipine (NORVASC) 10 MG tablet Take 1 tablet (10 mg total) by mouth daily. 02/02/22   Loletha Grayer, MD  carvedilol (COREG) 12.5 MG tablet Take 12.5 mg by mouth 2 (two) times daily with a meal.    [provider]  cefdinir (OMNICEF) 125 MG/5ML suspension Take 5 mLs (125 mg total) by mouth 2 (two) times daily. 08/21/22   [name], [name], NP-C  cetirizine (ZYRTEC) 10 MG tablet Take 10 mg by mouth at bedtime.    [provider]  cinacalcet (SENSIPAR) 60 MG tablet Take 60 mg by mouth at bedtime.    [provider]  diclofenac Sodium (VOLTAREN) 1 % GEL Apply 1 application  topically 4 (four) times daily as needed (hand,feet,thighs and back pain).     [provider]  gabapentin (NEURONTIN) 100 MG capsule Take 100 mg by mouth 3 (three) times daily. 01/15/22   [provider]  levothyroxine (SYNTHROID) 200 MCG tablet Take 200 mcg by mouth daily before breakfast.    [provider]  ondansetron (ZOFRAN-ODT) 4 MG disintegrating tablet Take 4 mg by mouth every 8 (eight) hours as needed for nausea or vomiting. 01/08/22   [provider]  VELPHORO 500 MG chewable tablet Chew 500 mg by mouth 3 (three) times daily with meals. 02/12/22   [provider]      Allergies    Ciprofloxacin and Rituximab    Review of Systems   Review of Systems Negative except as per HPI Physical Exam Updated Vital Signs BP (!) 160/98   Pulse 75   Temp 97.7 F (36.5 C) (Oral)   Resp 12   LMP 03/31/2022 (Exact Date) Comment: pt bleeding last 6 weeks with recent multiple transfusions  SpO2 99%  Physical Exam Vitals and nursing note reviewed.  Constitutional:      General: She is not in acute distress.    Appearance: She is well-developed. She is obese. She is not diaphoretic.  HENT:     Head: Normocephalic and atraumatic.  Eyes:     Conjunctiva/sclera: Conjunctivae normal.  Cardiovascular:     Rate and Rhythm: Normal rate and regular rhythm.     Heart sounds: Normal heart sounds.  Pulmonary:  Effort: Pulmonary effort is normal.  Abdominal:     Palpations: Abdomen is soft.     Tenderness: There is no abdominal tenderness.  Musculoskeletal:       Back:  Skin:    General: Skin is warm and dry.     Findings: No erythema or rash.  Neurological:     Mental Status: She is alert and oriented to person, place, and time.     Gait: Gait normal.  Psychiatric:        Behavior: Behavior normal.     ED Results / Procedures / Treatments   Labs (all labs ordered are listed, but only abnormal results are displayed) Labs Reviewed  COMPREHENSIVE METABOLIC PANEL - Abnormal; Notable for the following components:       Result Value   Sodium 131 (*)    Chloride 92 (*)    BUN 50 (*)    Creatinine, Ser 11.83 (*)    AST 10 (*)    GFR, Estimated 4 (*)    Anion gap 16 (*)    All other components within normal limits  CBC WITH DIFFERENTIAL/PLATELET - Abnormal; Notable for the following components:   RBC 1.72 (*)    Hemoglobin 5.4 (*)    HCT 16.1 (*)    All other components within normal limits  HCG, QUANTITATIVE, PREGNANCY - Abnormal; Notable for the following components:   hCG, Beta Chain, Quant, S 7 (*)    All other components within normal limits  HEMOGLOBIN AND HEMATOCRIT, BLOOD - Abnormal; Notable for the following components:   Hemoglobin 5.2 (*)    HCT 16.2 (*)    All other components within normal limits  I-STAT BETA HCG BLOOD, ED ([facility], [facility], [facility] ONLY) - Abnormal; Notable for the following components:   I-stat hCG, quantitative 59.5 (*)    All other components within normal limits  LIPASE, BLOOD  URINALYSIS, ROUTINE W REFLEX MICROSCOPIC  IRON AND TIBC  FERRITIN  RETICULOCYTES  TYPE AND SCREEN  PREPARE RBC (CROSSMATCH)    EKG None  Radiology US Pelvis Complete  Result Date: 08/25/2022 CLINICAL DATA:  Left flank pain. EXAM: TRANSABDOMINAL ULTRASOUND OF PELVIS DOPPLER ULTRASOUND OF OVARIES TECHNIQUE: Transabdominal ultrasound examination of the pelvis was performed including evaluation of the uterus, ovaries, adnexal regions, and pelvic cul-de-sac. Color and duplex Doppler ultrasound was utilized to evaluate blood flow to the ovaries. COMPARISON:  02/18/2022 FINDINGS: Uterus Status post hysterectomy Endometrium Not applicable Right ovary Measurements: 3.3 x 2.6 x 2.6 cm = volume: 1.2 mL. Normal appearance/no adnexal mass. Left ovary The left ovary is suboptimally visualized by transabdominal technique. (Patient refuses transvaginal technique). The left ovary measures 5.5 x 3.9 x 5.5 cm. Previously 5.1 x 4.1 x 5.8 cm. Cyst within the left ovary measures approximately 4.8 cm. Previously this measured  3.7 cm. Pulsed Doppler evaluation demonstrates normal low-resistance arterial and venous waveforms in the right ovary. Suboptimal Doppler evaluation of the left ovary due to limitations of transabdominal technique. Other: No free fluid. IMPRESSION: 1. No acute findings. No evidence for right ovarian torsion. Suboptimal Doppler evaluation of the left ovary. 2. Left ovarian cyst measures approximately 4.8 cm. Previously this measured 3.7 cm. Recommend followup US in 3-6 months. Note: This recommendation does not apply to premenarchal patients or to those with increased risk (genetic, family history, elevated tumor markers or other high-risk factors) of ovarian cancer. Reference: Radiology 2019 Nov; 293(2):359-371. Electronically Signed   By: Kerby Moors M.D.   On: 08/25/2022 09:01  [place] Art/Ven Flow Abd Pelv Doppler  Result Date: 08/25/2022 CLINICAL DATA:  Left flank pain. EXAM: TRANSABDOMINAL ULTRASOUND OF PELVIS DOPPLER ULTRASOUND OF OVARIES TECHNIQUE: Transabdominal ultrasound examination of the pelvis was performed including evaluation of the uterus, ovaries, adnexal regions, and pelvic cul-de-sac. Color and duplex Doppler ultrasound was utilized to evaluate blood flow to the ovaries. COMPARISON:  02/18/2022 FINDINGS: Uterus Status post hysterectomy Endometrium Not applicable Right ovary Measurements: 3.3 x 2.6 x 2.6 cm = volume: 1.2 mL. Normal appearance/no adnexal mass. Left ovary The left ovary is suboptimally visualized by transabdominal technique. (Patient refuses transvaginal technique). The left ovary measures 5.5 x 3.9 x 5.5 cm. Previously 5.1 x 4.1 x 5.8 cm. Cyst within the left ovary measures approximately 4.8 cm. Previously this measured 3.7 cm. Pulsed Doppler evaluation demonstrates normal low-resistance arterial and venous waveforms in the right ovary. Suboptimal Doppler evaluation of the left ovary due to limitations of transabdominal technique. Other: No free fluid. IMPRESSION: 1. No acute  findings. No evidence for right ovarian torsion. Suboptimal Doppler evaluation of the left ovary. 2. Left ovarian cyst measures approximately 4.8 cm. Previously this measured 3.7 cm. Recommend followup US in 3-6 months. Note: This recommendation does not apply to premenarchal patients or to those with increased risk (genetic, family history, elevated tumor markers or other high-risk factors) of ovarian cancer. Reference: Radiology 2019 Nov; 293(2):359-371. Electronically Signed   By: Kerby Moors M.D.   On: 08/25/2022 09:01   CT Renal Stone Study  Result Date: 08/25/2022 CLINICAL DATA:  Left flank pain. EXAM: CT ABDOMEN AND PELVIS WITHOUT CONTRAST TECHNIQUE: Multidetector CT imaging of the abdomen and pelvis was performed following the standard protocol without IV contrast. RADIATION DOSE REDUCTION: This exam was performed according to the departmental dose-optimization program which includes automated exposure control, adjustment of the mA and/or kV according to patient size and/or use of iterative reconstruction technique. COMPARISON:  June 27, 2021 FINDINGS: Lower chest: There is a small, predominantly anterior pericardial effusion. This measures 6 mm in thickness and represents a new finding when compared to the prior study. Hepatobiliary: A 15 mm diameter cyst is seen within the left lobe of the liver. No gallstones, gallbladder wall thickening, or biliary dilatation. Pancreas: Unremarkable. No pancreatic ductal dilatation or surrounding inflammatory changes. Spleen: Normal in size without focal abnormality. Adrenals/Urinary Tract: Adrenal glands are unremarkable. Kidneys are atrophic in size, without focal lesions. Multiple bilateral subcentimeter nonobstructing renal calculi are noted. The urinary bladder is poorly distended and subsequently limited in evaluation. Mild urinary bladder wall thickening is seen with a mild amount of surrounding inflammatory fat stranding. Stomach/Bowel: Stomach is within  normal limits. Appendix appears normal. No evidence of bowel wall thickening, distention, or inflammatory changes. Vascular/Lymphatic: Aortic atherosclerosis. Subcentimeter para-aortic and aortocaval lymph nodes are noted. Reproductive: Status post hysterectomy. A 5.8 cm x 4.3 cm x 6.8 cm complex left adnexal mass is seen (axial CT images 61 through 74, CT series 3). A 17 mm diameter simple cyst is seen along the medial aspect of the right adnexa (axial CT image 71, CT series 3). Other: No abdominal wall hernia or abnormality. No abdominopelvic ascites. Musculoskeletal: No acute or significant osseous findings. IMPRESSION: 1. Findings which may represent mild cystitis. Correlation with urinalysis is recommended. 2. Multiple bilateral subcentimeter nonobstructing renal calculi. 3. Complex left adnexal mass, as described above. Further evaluation with nonemergent pelvic ultrasound is recommended. 4. Small, predominantly anterior pericardial effusion. 5. Aortic atherosclerosis. Aortic Atherosclerosis (ICD10-I70.0). Electronically Signed   By: Hoover Browns  Houston M.D.   On: 08/25/2022 02:37    Procedures .Critical Care  Performed by: Tacy Learn, PA-C Authorized by: Tacy Learn, PA-C   Critical care provider statement:    Critical care time (minutes):  75   Critical care was time spent personally by me on the following activities:  Development of treatment plan with patient or surrogate, discussions with consultants, evaluation of patient's response to treatment, examination of patient, ordering and review of laboratory studies, ordering and review of radiographic studies, ordering and performing treatments and interventions, pulse oximetry, re-evaluation of patient's condition and review of old charts     Medications Ordered in ED Medications  0.9 %  sodium chloride infusion (Manually program via Guardrails IV Fluids) (has no administration in time range)  0.9 %  sodium chloride infusion (Manually  program via Guardrails IV Fluids) (has no administration in time range)  0.9 %  sodium chloride infusion (Manually program via Guardrails IV Fluids) (has no administration in time range)  oxyCODONE-acetaminophen (PERCOCET/ROXICET) 5-325 MG per tablet 1 tablet (1 tablet Oral Given 08/24/22 2258)  oxyCODONE (Oxy IR/ROXICODONE) immediate release tablet 5 mg (5 mg Oral Given 08/25/22 0735)  HYDROmorphone (DILAUDID) injection 1 mg (1 mg Intravenous Given 08/25/22 1217)  ondansetron (ZOFRAN) injection 4 mg (4 mg Intravenous Given 08/25/22 1216)    ED Course/ Medical Decision Making/ A&P                           Medical Decision Making Amount and/or Complexity of Data Reviewed Labs: ordered. Radiology: ordered.   This patient presents to the ED for concern of left flank pain, this involves an extensive number of treatment options, and is a complaint that carries with it a high risk of complications and morbidity.  The differential diagnosis includes but not limited to diverticulitis, colitis, kidney stone, UTI, pyelonephritis, ectopic pregnancy, torsion, cyst, post op complication   Co morbidities that complicate the patient evaluation  ESRD on home dialysis nightly, anemia, HTN, additional history reviewed in chart   Additional history obtained:  External records from outside source obtained and reviewed including dc summary from admission for vaginal hysterectomy dated 06/30/22, dc summary from 07/19/22 admission for seroma post op (drainage of 19m serous fluid) covered with abx while awaiting results. Labs from patients nephrology clinic on her phone chart dated 08/11/22 hgb 7.8 and 08/21/22 hbg 6.0. Thought to be down trending due to holding her Venifer doses while on antibioitcs also thought to be due to high inflammation post op causing problems making RBCs.  Prior [place] dated 02/18/22 with left ovarian cyst measuring 3.7cm   Lab Tests:  I Ordered, and personally interpreted labs.  The  pertinent results include:   CBP with hgb 5.4, repeat H&H with decrease to 5.2. CMP with Cr 11.83 similar to on file 1 month ago, normal K. Lipase WNL. I stat HCG elevated at 59, repeat formal quant 7 (similar to prior, patient states this is her baseline).    Imaging Studies ordered:  I ordered imaging studies including CT without contrast, transabdominal pelvic [place] do[place] (refuses any vaginal exams until cleared by her GYN).   I independently visualized and interpreted imaging which showed CT with concern for adnexal mass. [place] for torsion  I agree with the radiologist interpretation   Consultations Obtained:  [facility], Minimally invasive who has spoken  with the fellow and attending,  and discussed lab and imaging findings as well as pertinent plan - they recommend: Feel patient's presentation today is not related to her hysterectomy or postop seroma.  Does not need transfer to [facility] for management.  Clinic to follow-up with patient in the coming week. Consult with gynecology, Dr. Mikel Cella who will see the patient. Consult to Dr. Moshe Cipro with nephrology who will assist with dialysis while admitted. Consult to Dr. Roosevelt Locks with [facility] who will consult for admission.    Problem List / ED Course / Critical interventions / Medication management  37 year old female with left flank pain with complex history at baseline and recent vaginal hysterectomy complicated by post op seroma. Concern for quant of 59 with left adnexal mass and possible ectopic. On further discussion with patient, she has not been sexually active since before her surgery in October, felt less likely to be related to ectopic pregnancy. Repeat formal quant in the pain lab is only 7 (similar to prior). Pelvic US with left adnexal 4.8 cm cyst without torsion. Discussed with patient's [facility] team, felt not related to her recent  surgery and does not need transfer to [facility] for management. Transfused in the ER with consults to GYN, nephrology and hospitalist for further work up.  I ordered medication including PRBC, dilaudid and zofran  for anemia, pain  Reevaluation of the patient after these medicines showed that the patient improved I have reviewed the patients home medicines and have made adjustments as needed   Social Determinants of Health:  Has specialty care team for follow up. GYN clinic plans to arrange for follow up in clinic this week.    Test / Admission - Considered:  Admit for further work up and monitoring          Final Clinical Impression(s) / ED Diagnoses Final diagnoses:  Anemia, unspecified type  Adnexal mass  ESRD (end stage renal disease) on dialysis [facility])    Rx / DC Orders ED Discharge Orders     None         Tacy Learn, PA-C 08/25/22 1241    Pattricia Boss, MD 08/25/22 [id number]

## 2022-08-25 NOTE — Consult Note (Signed)
OBSTETRICS AND GYNECOLOGY ATTENDING CONSULT NOTE  Consult Date: 08/25/2022 Reason for Consult: L adnexal mass Consulting Provider: Dr. Jeanell Sparrow   Assessment/Plan: 37yo with ESRD and anemia s/p LAVH for AUB with post op course c/b post op pelvic fluid collection now with 4.8cm L adnexal cyst and LLQ pain - Pain unlikely to be gynecologic source, however discussed possibility of cyst rupture - would only recommend pain control in that setting with no additional intervention needed. - No evidence of torsion on exam today, no acute surgical intervention. Would not recommend IR drainage of ovary as cyst of this size is likely to resolve spontaneously. - Pt has follow up imaging scheduled with her gynecologist at [facility] in 1 month  Appreciate care of Paralee Cancel by her primary team Gynecology will sign off at this time, but please call [phone] [facility] OB/GYN Consult Attending Monday-Friday 8am - 5pm) or [phone] [facility] OB/GYN Attending On Call all day, every day) if questions arise.  Thank you for involving [place] in the care of this patient.  Total consultation time including face-to-face time with patient (>50% of time), reviewing chart and documentation: 60 minutes  Gale Journey, MD [place], [facility] for [facility] [place] Phone: [phone]  History of Present Illness: Holly [name] is an 37 y.o. G26P0010 female who was admitted for ESRD and anemia.   Khai reports acute LLQ/flank pain that acutely worsened prompting her to present to ED. Denies any n/v, maybe some decreased appetite. Loose stools over the weekend. Low grade fever at home yesterday AM that she attributed to viral URI symptoms. Denies abnormal vaginal discharge or vaginal bleeding.   Of note, pt is s/p LAVH for AUB at [facility] in October. Her post op course was complicated by pelvic fluid collection (hematoma vs abscess vs seroma) and drainage by IR was  attempted at the end of November without success (only removed 51m clear fluid, culture w/ no growth). She last saw her surgeon for post op check on 12/6 and plan was for follow up ultrasound in February to assess pelvic fluid collection.   Here, the patient has been afebrile, HDS - nontachycardic and hypertensive. Lab revealed Hgb 5.2. CT A/P with 5.8 cm x 4.3 cm x 6.8 cm complex left adnexal mass and 1.7cm simple cyst in R adnexal. Transabdominal pelvic [place] 4.8[place] L ovarian cyst (previously 3.7cm on [place]27[place]23) and no other acute findings. She is being admitted to the hospitalist service for further evaluation of her anemia.  Pertinent OB/GYN History: Patient's last menstrual period was 03/31/2022 (exact date). OB History  Gravida Para Term Preterm AB Living  1 0 0 0 1 0  SAB IAB Ectopic Multiple Live Births  1 0 0 0 0    # Outcome Date GA Lbr Len/2nd Weight Sex Delivery Anes PTL Lv  1 SAB            Patient Active Problem List   Diagnosis Date Noted   Bilateral ovarian cysts 08/25/2022   Symptomatic anemia 08/25/2022   [place] stage renal disease ([place] 08/07/2022   Pelvic fluid collection 07/19/2022   Anemia 03/31/2022   Menorrhagia 02/19/2022   ABLA (acute blood loss anemia) 02/19/2022   Fluid overload 02/01/2022   Hyponatremia 01/31/2022   Hyperkalemia 01/30/2022   CKD (chronic kidney disease), stage IV ([place] 12/26/2021   MPGN (membranoproliferative glomerulonephritis), 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   Essential hypertension 11/08/2021   Hypothyroidism 11/08/2021   PD catheter dysfunction (  [place]) 10/21/2021   Anemia of chronic disease 10/07/2021   Peritoneal dialysis catheter fitting or adjustment ([place]) 10/04/2021   ESRD on dialysis [facility]) 11/05/2018   Glomerulonephritis 11/05/2018    Past Medical History:  Diagnosis Date   Anemia    Anxiety    Arthritis    hands   Blood transfusion without reported diagnosis    Cancer ([place])    Complication of anesthesia    with Fentanyl she is  difficult to awake   Depression    Dialysis patient ([place])    Dyspnea    due to anemia   Epilepsy ([place])    Hashimoto's disease    History of kidney stones    HLD (hyperlipidemia)    Hypertension    Hypothyroidism    Pneumonia    Renal disorder    Thyroid disease     Past Surgical History:  Procedure Laterality Date   ABDOMINAL HYSTERECTOMY     ABDOMINAL SURGERY     AV FISTULA PLACEMENT Left 01/17/2022   Procedure: ARTERIOVENOUS (AV) FISTULA CREATION ( RADIAL CEPHALIC);  Surgeon: Katha Cabal, MD;  Location: [facility] ORS;  Service: Vascular;  Laterality: Left;   CERVICAL BIOPSY  W/ LOOP ELECTRODE EXCISION  2018   COLONOSCOPY N/A 01/30/2022   Procedure: COLONOSCOPY;  Surgeon: Annamaria Helling, DO;  Location: [facility] ENDOSCOPY;  Service: Gastroenterology;  Laterality: N/A;   COLONOSCOPY WITH PROPOFOL N/A 02/01/2022   Procedure: COLONOSCOPY WITH PROPOFOL;  Surgeon: Lesly Rubenstein, MD;  Location: [facility] ENDOSCOPY;  Service: Endoscopy;  Laterality: N/A;   DIAGNOSTIC LAPAROSCOPY     due to removal of PD catheters   DIALYSIS/PERMA CATHETER INSERTION N/A 06/09/2022   Procedure: DIALYSIS/PERMA CATHETER INSERTION;  Surgeon: Algernon Huxley, MD;  Location: [place] CV LAB;  Service: Cardiovascular;  Laterality: N/A;   DIALYSIS/PERMA CATHETER INSERTION N/A 06/30/2022   Procedure: DIALYSIS/PERMA CATHETER INSERTION;  Surgeon: [place] Heck, MD;  Location: [place] CV LAB;  Service: Cardiovascular;  Laterality: N/A;   ESOPHAGOGASTRODUODENOSCOPY N/A 01/30/2022   Procedure: ESOPHAGOGASTRODUODENOSCOPY (EGD);  Surgeon: Annamaria Helling, DO;  Location[facility] ENDOSCOPY;  Service: Gastroenterology;  Laterality: N/A;   ESOPHAGOGASTRODUODENOSCOPY (EGD) WITH PROPOFOL N/A 02/01/2022   Procedure: ESOPHAGOGASTRODUODENOSCOPY (EGD) WITH PROPOFOL;  Surgeon: Lesly Rubenstein, MD;  Location: [facility] ENDOSCOPY;  Service: Endoscopy;  Laterality: N/A;   RENAL BIOPSY     x 2   TUMOR REMOVAL      right face   UTERINE ARTERY EMBOLIZATION  05/04/2022    Family History  Problem Relation Age of Onset   Multiple sclerosis Mother    Cancer Father    COPD Father     Social History:  reports that she has quit smoking. Her smoking use included cigarettes. She has never used smokeless tobacco. She reports that she does not drink alcohol and does not use drugs.  Allergies:  Allergies  Allergen Reactions   Ciprofloxacin Anaphylaxis and Other (See Comments)    Caused Seizures   Seizures   Other reaction(s): Other (See Comments)  Seizures  Seizures     Caused Seizures  Seizures  Seizures  Other reaction(s): Other (See Comments) Seizures  Seizures  Seizures     Other reaction(s): Other (See Comments) Seizures  Seizures  Seizures     Caused Seizures  Caused Seizures  Seizures  Seizures  Other reaction(s): Other (See Comments) Seizures  Seizures  Seizures    Seizures Grand mal   Rituximab Anaphylaxis and Swelling    Medications: I  have reviewed the patient's current medications.  Review of Systems: Pertinent items are noted in HPI.  Focused Physical Examination: BP (!) 151/99   Pulse 75   Temp 98 F (36.7 C) (Oral)   Resp 10   LMP 03/31/2022 (Exact Date) Comment: pt bleeding last 6 weeks with recent multiple transfusions  SpO2 98%  CONSTITUTIONAL: No acute distress, lying in bed comfortably NEUROLOGIC: Alert and oriented to person, place, and time. PSYCHIATRIC: Normal mood and affect. Normal behavior. Normal judgment and thought content. CARDIOVASCULAR: Normal heart rate noted RESPIRATORY: Normal respiratory effort ABDOMEN: Soft, no distention noted. No tenderness, rebound or guarding. Incisions well healed. PELVIC: Deferred after discussion with patient.  Labs and Imaging: Results for orders placed or performed during the hospital encounter of 08/24/22 (from the past 72 hour(s))  Comprehensive metabolic panel     Status: Abnormal   Collection Time: 08/24/22  10:53 PM  Result Value Ref Range   Sodium 131 (L) 135 - 145 mmol/L   Potassium 4.9 3.5 - 5.1 mmol/L   Chloride 92 (L) 98 - 111 mmol/L   CO2 23 22 - 32 mmol/L   Glucose, Bld 91 70 - 99 mg/dL    Comment: Glucose reference range applies only to samples taken after fasting for at least 8 hours.   BUN 50 (H) 6 - 20 mg/dL   Creatinine, Ser 11.83 (H) 0.44 - 1.00 mg/dL   Calcium 9.4 8.9 - 10.3 mg/dL   Total Protein 6.9 6.5 - 8.1 g/dL   Albumin 3.6 3.5 - 5.0 g/dL   AST 10 (L) 15 - 41 U/L   ALT 11 0 - 44 U/L   Alkaline Phosphatase 63 38 - 126 U/L   Total Bilirubin 0.6 0.3 - 1.2 mg/dL   GFR, Estimated 4 (L) >60 mL/min    Comment: (NOTE) Calculated using the CKD-EPI Creatinine Equation (2021)    Anion gap 16 (H) 5 - 15    Comment: Performed at [facility], [place] [address] [place], [place] [address]  Lipase, blood     Status: None   Collection Time: 08/24/22 10:53 PM  Result Value Ref Range   Lipase 32 11 - 51 U/L    Comment: Performed at [facility], [place] [address] [place], [place] [address]  CBC with Diff     Status: Abnormal   Collection Time: 08/24/22 10:53 PM  Result Value Ref Range   WBC 7.7 4.0 - 10.5 K/uL   RBC 1.72 (L) 3.87 - 5.11 MIL/uL   Hemoglobin 5.4 (LL) 12.0 - 15.0 g/dL    Comment: CRITICAL VALUE NOTED.  VALUE IS CONSISTENT WITH PREVIOUSLY REPORTED AND CALLED VALUE. REPEATED TO VERIFY    HCT 16.1 (L) 36.0 - 46.0 %   MCV 93.6 80.0 - 100.0 fL   MCH 31.4 26.0 - 34.0 pg   MCHC 33.5 30.0 - 36.0 g/dL   RDW 14.7 11.5 - 15.5 %   Platelets 209 150 - 400 K/uL   nRBC 0.0 0.0 - 0.2 %   Neutrophils Relative % 78 %   Neutro Abs 6.0 1.7 - 7.7 K/uL   Lymphocytes Relative 9 %   Lymphs Abs 0.7 0.7 - 4.0 K/uL   Monocytes Relative 7 %   Monocytes Absolute 0.5 0.1 - 1.0 K/uL   Eosinophils Relative 5 %   Eosinophils Absolute 0.4 0.0 - 0.5 K/uL   Basophils Relative 1 %   Basophils Absolute 0.0 0.0 - 0.1 K/uL   Immature Granulocytes  0 %   Abs Immature Granulocytes 0.03  0.00 - 0.07 K/uL    Comment: Performed at [facility], [place] [address] [place], [place] [address]  Prepare RBC (crossmatch)     Status: None   Collection Time: 08/24/22 11:57 PM  Result Value Ref Range   Order Confirmation      ORDER PROCESSED BY BLOOD BANK Performed at [facility], [place] [address], [place], [place] [address]   I-Stat beta hCG blood, ED     Status: Abnormal   Collection Time: 08/25/22  1:32 AM  Result Value Ref Range   I-stat hCG, quantitative 59.5 (H) <5 mIU/mL   Comment 3            Comment:   GEST. AGE      CONC.  (mIU/mL)   <=1 WEEK        5 - 50     2 WEEKS       50 - 500     3 WEEKS       100 - 10,000     4 WEEKS     1,000 - 30,000        FEMALE AND NON-PREGNANT FEMALE:     LESS THAN 5 mIU/mL   Type and screen [place]     Status: None (Preliminary result)   Collection Time: 08/25/22  7:45 AM  Result Value Ref Range   ABO/RH(D) O POS    Antibody Screen NEG    Sample Expiration 08/28/2022,2359    Unit Number [id number]    Blood Component Type RED CELLS,LR    Unit division 00    Status of Unit ISSUED    Donor AG Type NEGATIVE FOR KIDD A ANTIGEN    Transfusion Status OK TO TRANSFUSE    Crossmatch Result COMPATIBLE    Unit Number [id number]    Blood Component Type RED CELLS,LR    Unit division 00    Status of Unit ISSUED    Donor AG Type NEGATIVE FOR KIDD A ANTIGEN    Transfusion Status OK TO TRANSFUSE    Crossmatch Result COMPATIBLE   hCG, quantitative, pregnancy     Status: Abnormal   Collection Time: 08/25/22  8:20 AM  Result Value Ref Range   hCG, Beta Chain, Quant, S 7 (H) <5 mIU/mL    Comment:          GEST. AGE      CONC.  (mIU/mL)   <=1 WEEK        5 - 50     2 WEEKS       50 - 500     3 WEEKS       100 - 10,000     4 WEEKS     1,000 - 30,000     5 WEEKS     3,500 - 115,000   6-8 WEEKS     12,000 - 270,000    12 WEEKS     15,000 - 220,000        FEMALE AND NON-PREGNANT FEMALE:     LESS THAN 5  mIU/mL Performed at [facility], [place] [address], [place], [place] [address]   Hemoglobin and hematocrit, blood     Status: Abnormal   Collection Time: 08/25/22  8:20 AM  Result Value Ref Range   Hemoglobin 5.2 (LL) 12.0 - 15.0 g/dL    Comment: REPEATED TO VERIFY THIS CRITICAL RESULT HAS VERIFIED AND  BEEN CALLED TO A. BANKS, RN BY JULIE MACEDA DEL ANGEL ON 01 01 2024 AT 0908, AND HAS BEEN READ BACK.     HCT 16.2 (L) 36.0 - 46.0 %    Comment: Performed at [facility], [place] [address] [place],  [address]    US Pelvis Complete  Result Date: 08/25/2022 CLINICAL DATA:  Left flank pain. EXAM: TRANSABDOMINAL ULTRASOUND OF PELVIS DOPPLER ULTRASOUND OF OVARIES TECHNIQUE: Transabdominal ultrasound examination of the pelvis was performed including evaluation of the uterus, ovaries, adnexal regions, and pelvic cul-de-sac. Color and duplex Doppler ultrasound was utilized to evaluate blood flow to the ovaries. COMPARISON:  02/18/2022 FINDINGS: Uterus Status post hysterectomy Endometrium Not applicable Right ovary Measurements: 3.3 x 2.6 x 2.6 cm = volume: 1.2 mL. Normal appearance/no adnexal mass. Left ovary The left ovary is suboptimally visualized by transabdominal technique. (Patient refuses transvaginal technique). The left ovary measures 5.5 x 3.9 x 5.5 cm. Previously 5.1 x 4.1 x 5.8 cm. Cyst within the left ovary measures approximately 4.8 cm. Previously this measured 3.7 cm. Pulsed Doppler evaluation demonstrates normal low-resistance arterial and venous waveforms in the right ovary. Suboptimal Doppler evaluation of the left ovary due to limitations of transabdominal technique. Other: No free fluid. IMPRESSION: 1. No acute findings. No evidence for right ovarian torsion. Suboptimal Doppler evaluation of the left ovary. 2. Left ovarian cyst measures approximately 4.8 cm. Previously this measured 3.7 cm. Recommend followup US in 3-6 months. Note: This recommendation does not apply to  premenarchal patients or to those with increased risk (genetic, family history, elevated tumor markers or other high-risk factors) of ovarian cancer. Reference: Radiology 2019 Nov; 293(2):359-371. Electronically Signed   By: Kerby Moors M.D.   On: 08/25/2022 09:01   [place] Art/Ven Flow Abd Pelv Doppler  Result Date: 08/25/2022 CLINICAL DATA:  Left flank pain. EXAM: TRANSABDOMINAL ULTRASOUND OF PELVIS DOPPLER ULTRASOUND OF OVARIES TECHNIQUE: Transabdominal ultrasound examination of the pelvis was performed including evaluation of the uterus, ovaries, adnexal regions, and pelvic cul-de-sac. Color and duplex Doppler ultrasound was utilized to evaluate blood flow to the ovaries. COMPARISON:  02/18/2022 FINDINGS: Uterus Status post hysterectomy Endometrium Not applicable Right ovary Measurements: 3.3 x 2.6 x 2.6 cm = volume: 1.2 mL. Normal appearance/no adnexal mass. Left ovary The left ovary is suboptimally visualized by transabdominal technique. (Patient refuses transvaginal technique). The left ovary measures 5.5 x 3.9 x 5.5 cm. Previously 5.1 x 4.1 x 5.8 cm. Cyst within the left ovary measures approximately 4.8 cm. Previously this measured 3.7 cm. Pulsed Doppler evaluation demonstrates normal low-resistance arterial and venous waveforms in the right ovary. Suboptimal Doppler evaluation of the left ovary due to limitations of transabdominal technique. Other: No free fluid. IMPRESSION: 1. No acute findings. No evidence for right ovarian torsion. Suboptimal Doppler evaluation of the left ovary. 2. Left ovarian cyst measures approximately 4.8 cm. Previously this measured 3.7 cm. Recommend followup US in 3-6 months. Note: This recommendation does not apply to premenarchal patients or to those with increased risk (genetic, family history, elevated tumor markers or other high-risk factors) of ovarian cancer. Reference: Radiology 2019 Nov; 293(2):359-371. Electronically Signed   By: Kerby Moors M.D.   On: 08/25/2022  09:01   CT Renal Stone Study  Result Date: 08/25/2022 CLINICAL DATA:  Left flank pain. EXAM: CT ABDOMEN AND PELVIS WITHOUT CONTRAST TECHNIQUE: Multidetector CT imaging of the abdomen and pelvis was performed following the standard protocol without IV contrast. RADIATION DOSE REDUCTION: This exam was performed according to  the departmental dose-optimization program which includes automated exposure control, adjustment of the mA and/or kV according to patient size and/or use of iterative reconstruction technique. COMPARISON:  June 27, 2021 FINDINGS: Lower chest: There is a small, predominantly anterior pericardial effusion. This measures 6 mm in thickness and represents a new finding when compared to the prior study. Hepatobiliary: A 15 mm diameter cyst is seen within the left lobe of the liver. No gallstones, gallbladder wall thickening, or biliary dilatation. Pancreas: Unremarkable. No pancreatic ductal dilatation or surrounding inflammatory changes. Spleen: Normal in size without focal abnormality. Adrenals/Urinary Tract: Adrenal glands are unremarkable. Kidneys are atrophic in size, without focal lesions. Multiple bilateral subcentimeter nonobstructing renal calculi are noted. The urinary bladder is poorly distended and subsequently limited in evaluation. Mild urinary bladder wall thickening is seen with a mild amount of surrounding inflammatory fat stranding. Stomach/Bowel: Stomach is within normal limits. Appendix appears normal. No evidence of bowel wall thickening, distention, or inflammatory changes. Vascular/Lymphatic: Aortic atherosclerosis. Subcentimeter para-aortic and aortocaval lymph nodes are noted. Reproductive: Status post hysterectomy. A 5.8 cm x 4.3 cm x 6.8 cm complex left adnexal mass is seen (axial CT images 61 through 74, CT series 3). A 17 mm diameter simple cyst is seen along the medial aspect of the right adnexa (axial CT image 71, CT series 3). Other: No abdominal wall hernia or  abnormality. No abdominopelvic ascites. Musculoskeletal: No acute or significant osseous findings. IMPRESSION: 1. Findings which may represent mild cystitis. Correlation with urinalysis is recommended. 2. Multiple bilateral subcentimeter nonobstructing renal calculi. 3. Complex left adnexal mass, as described above. Further evaluation with nonemergent pelvic ultrasound is recommended. 4. Small, predominantly anterior pericardial effusion. 5. Aortic atherosclerosis. Aortic Atherosclerosis (ICD10-I70.0). Electronically Signed   By: Virgina Norfolk M.D.   On: 08/25/2022 02:37

## 2022-08-25 NOTE — ED Provider Notes (Signed)
37 year old female status post vaginal hysterectomy 06/24/2022, on dialysis, hypertension, renal disorder, seizures, anemia, presents complaining of left leg pain onset Sunday with nausea and loose stools.  On evaluation here she is found to be significantly anemic with a hemoglobin of 5 CT was obtained and showed a left adnexal complicated mass. Patient appears hemodynamically stable here although slightly hypertensive blood pressure 163/91. She is pale She has soft nontender abdomen with some pain in the low back Consult was made to [place].  They did not feel the patient needed to be transferred back there. Patient is to be admitted to hospitalist service for blood transfusion GYN was consulted Dr. Roosvelt Maser saw the patient at bedside and evaluated the patient.  She does not think that further GYN intervention is needed at this time    Pattricia Boss, MD 08/25/22 1441

## 2022-08-25 NOTE — H&P (Signed)
History and Physical    Holly [name] OZD:[id number] DOB: 11/27/1985 DOA: 08/24/2022  PCP: Girtha Rm, NP-C (Confirm with patient/family/NH records and if not entered, this has to be entered at [facility] point of entry) Patient coming from: Home  I have personally briefly reviewed patient's old medical records in [place]  Chief Complaint: Flank pain, feeling tired  HPI: Holly [name] is a 37 y.o. 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  Symptoms started this morning, patient woke up around 5 AM with new onset of left flank, left side of pelvis sharp pain, mild initially then turned severe 10/10 in 1 hour, cramping-like> sharp, denies any nauseous vomiting no diarrhea.  Patient also reported that after recent intravaginal hysterectomy in October, she developed a pelvic hematoma and subsequently infected, for which she underwent IR drainage and a total of 2 weeks antibiotic treatment completed about 3 weeks ago.  Last week, she also developed URI like symptoms with sore throat, productive cough with yellow/greenish sputum, and low-grade fever with Tmax 99.0, she went to see her PCP 4 days ago and has been on cefdinir twice daily for 7 days.  Patient also complains about recently generalized weakness and increasing exertional dyspnea denies of cough and blood vaginal bleeding or rectal bleeding or dark-colored stool.  She reported that she used to take iron supplement but recently on hold due to pelvic infection and antibiotic treatment.  ED Course:  Afebrile, none tachycardia nonhypotensive nonhypoxic.  CT abdomen pel with showed suspicious cystitis, multiple bilateral subcentimeter nonobstructing renal stone, complex laxed adnexal mass, and the Doppler study showed left ovarian cyst measuring 4.8 cm increased from previous 3.7 cm, no signs of torsion. Blood work creatinine 11, BUN 50, hemoglobin 5.4 compared to 6.0 last week, WBC 7.7.  2 units of PRBC ordered in the ED, gynecology consulted in the ED.  Review of Systems: As per HPI otherwise 14 point review of systems negative.   Past Medical History:  Diagnosis Date   Anemia    Anxiety    Arthritis    hands   Blood transfusion without reported diagnosis    Cancer ([place])    Complication of anesthesia    with Fentanyl she is difficult to awake   Depression    Dialysis patient ([place])    Dyspnea    due to anemia   Epilepsy ([place])    Hashimoto's disease    History of kidney stones    HLD (hyperlipidemia)    Hypertension    Hypothyroidism    Pneumonia    Renal disorder    Thyroid disease     Past Surgical History:  Procedure Laterality Date   ABDOMINAL HYSTERECTOMY     ABDOMINAL SURGERY     AV FISTULA PLACEMENT Left 01/17/2022   Procedure: ARTERIOVENOUS (AV) FISTULA CREATION ( RADIAL CEPHALIC);  Surgeon: Katha Cabal, MD;  Location: [facility] ORS;  Service: Vascular;  Laterality: Left;   CERVICAL BIOPSY  W/ LOOP ELECTRODE EXCISION  2018   COLONOSCOPY N/A 01/30/2022   Procedure: COLONOSCOPY;  Surgeon: Annamaria Helling, DO;  Location: [facility] ENDOSCOPY;  Service: Gastroenterology;  Laterality: N/A;   COLONOSCOPY WITH PROPOFOL  N/A 02/01/2022   Procedure: COLONOSCOPY WITH PROPOFOL;  Surgeon: Lesly Rubenstein, MD;  Location: [facility] ENDOSCOPY;  Service: Endoscopy;  Laterality: N/A;   DIAGNOSTIC LAPAROSCOPY     due to removal of PD catheters   DIALYSIS/PERMA CATHETER INSERTION N/A 06/09/2022   Procedure: DIALYSIS/PERMA CATHETER INSERTION;  Surgeon: Algernon Huxley, MD;   Location: [place] CV LAB;  Service: Cardiovascular;  Laterality: N/A;   DIALYSIS/PERMA CATHETER INSERTION N/A 06/30/2022   Procedure: DIALYSIS/PERMA CATHETER INSERTION;  Surgeon: Marty Heck, MD;  Location: [place] [facility] LAB;  Service: Cardiovascular;  Laterality: N/A;   ESOPHAGOGASTRODUODENOSCOPY N/A 01/30/2022   Procedure: ESOPHAGOGASTRODUODENOSCOPY (EGD);  Surgeon: Annamaria Helling, DO;  Location: [facility] ENDOSCOPY;  Service: Gastroenterology;  Laterality: N/A;   ESOPHAGOGASTRODUODENOSCOPY (EGD) WITH PROPOFOL N/A 02/01/2022   Procedure: ESOPHAGOGASTRODUODENOSCOPY (EGD) WITH PROPOFOL;  Surgeon: Lesly Rubenstein, MD;  Location: [facility] ENDOSCOPY;  Service: Endoscopy;  Laterality: N/A;   RENAL BIOPSY     x 2   TUMOR REMOVAL     right face   UTERINE ARTERY EMBOLIZATION  05/04/2022     reports that she has quit smoking. Her smoking use included cigarettes. She has never used smokeless tobacco. She reports that she does not drink alcohol and does not use drugs.  Allergies  Allergen Reactions   Ciprofloxacin Anaphylaxis and Other (See Comments)    Caused Seizures   Seizures   Other reaction(s): Other (See Comments)  Seizures  Seizures     Caused Seizures  Seizures  Seizures  Other reaction(s): Other (See Comments) Seizures  Seizures  Seizures     Other reaction(s): Other (See Comments) Seizures  Seizures  Seizures     Caused Seizures  Caused Seizures  Seizures  Seizures  Other reaction(s): Other (See Comments) Seizures  Seizures  Seizures    Seizures Grand mal   Rituximab Anaphylaxis and Swelling    Family History  Problem Relation Age of Onset   Multiple sclerosis Mother    Cancer Father    COPD Father      Prior to Admission medications   Medication Sig Start Date End Date Taking? Authorizing Provider  acetaminophen (TYLENOL) 500 MG tablet Take 1,000 mg by mouth every 6 (six) hours as needed for headache (pain).    [provider]  acyclovir  (ZOVIRAX) 200 MG capsule Take 200 mg by mouth 2 (two) times daily as needed (out breaks). 08/14/21   [provider]  albuterol (VENTOLIN HFA) 108 (90 Base) MCG/ACT inhaler Inhale 2-4 puffs into the lungs every 4 (four) hours as needed for wheezing (or cough). 04/11/22   Lilland, Alana, DO  amLODipine (NORVASC) 10 MG tablet Take 1 tablet (10 mg total) by mouth daily. 02/02/22   Loletha Grayer, MD  carvedilol (COREG) 12.5 MG tablet Take 12.5 mg by mouth 2 (two) times daily with a meal.    [provider]  cefdinir (OMNICEF) 125 MG/5ML suspension Take 5 mLs (125 mg total) by mouth 2 (two) times daily. 08/21/22   [name], [name], NP-C  cetirizine (ZYRTEC) 10 MG tablet Take 10 mg by mouth at bedtime.    [provider]  cinacalcet (SENSIPAR) 60 MG tablet Take 60 mg by mouth at bedtime.    [provider]  diclofenac Sodium (VOLTAREN) 1 % GEL Apply 1 application  topically 4 (four) times daily as needed (hand,feet,thighs and back pain).    [provider]  gabapentin (NEURONTIN) 100 MG capsule Take 100 mg by mouth 3 (three)  times daily. 01/15/22   [provider]  levothyroxine (SYNTHROID) 200 MCG tablet Take 200 mcg by mouth daily before breakfast.    [provider]  ondansetron (ZOFRAN-ODT) 4 MG disintegrating tablet Take 4 mg by mouth every 8 (eight) hours as needed for nausea or vomiting. 01/08/22   [provider]  VELPHORO 500 MG chewable tablet Chew 500 mg by mouth 3 (three) times daily with meals. 02/12/22   [provider]    Physical Exam: Vitals:   08/25/22 1158 08/25/22 1220 08/25/22 1232 08/25/22 1247  BP: (!) 123/110 (!) 160/98  (!) 163/91  Pulse: 86 75  80  Resp: '16 12  12  '$ Temp: 98.4 F (36.9 C) 97.7 F (36.5 C)  98 F (36.7 C)  TempSrc: Oral Oral Oral Oral  SpO2: 99% 99%  100%    Constitutional: NAD, calm, comfortable Vitals:   08/25/22 1158 08/25/22 1220 08/25/22 1232 08/25/22 1247  BP: (!)  123/110 (!) 160/98  (!) 163/91  Pulse: 86 75  80  Resp: '16 12  12  '$ Temp: 98.4 F (36.9 C) 97.7 F (36.5 C)  98 F (36.7 C)  TempSrc: Oral Oral Oral Oral  SpO2: 99% 99%  100%   Eyes: PERRL, lids and conjunctivae normal ENMT: Mucous membranes are moist. Posterior pharynx clear of any exudate or lesions.Normal dentition.  Neck: normal, supple, no masses, no thyromegaly Respiratory: clear to auscultation bilaterally, no wheezing, no crackles. Normal respiratory effort. No accessory muscle use.  Cardiovascular: Regular rate and rhythm, no murmurs / rubs / gallops. No extremity edema. 2+ pedal pulses. No carotid bruits.  Abdomen: tenderness left pelvic area, no rebound, no guarding, no masses palpated. No hepatosplenomegaly. Bowel sounds positive.  Musculoskeletal: no clubbing / cyanosis. No joint deformity upper and lower extremities. Good ROM, no contractures. Normal muscle tone.  Skin: no rashes, lesions, ulcers. No induration Neurologic: CN 2-12 grossly intact. Sensation intact, DTR normal. Strength 5/5 in all 4.  Psychiatric: Normal judgment and insight. Alert and oriented x 3. Normal mood.     Labs on Admission: I have personally reviewed following labs and imaging studies  CBC: Recent Labs  Lab 08/21/22 1128 08/24/22 2253 08/25/22 0820  WBC 5.3 7.7  --   NEUTROABS 3.9 6.0  --   HGB 6.0 Repeated and verified X2.* 5.4* 5.2*  HCT 17.2 Repeated and verified X2.* 16.1* 16.2*  MCV 90.5 93.6  --   PLT 169.0 209  --    Basic Metabolic Panel: Recent Labs  Lab 08/24/22 2253  NA 131*  K 4.9  CL 92*  CO2 23  GLUCOSE 91  BUN 50*  CREATININE 11.83*  CALCIUM 9.4   GFR: Estimated Creatinine Clearance: 8.1 mL/min (A) (by C-G formula based on SCr of 11.83 mg/dL (H)). Liver Function Tests: Recent Labs  Lab 08/24/22 2253  AST 10*  ALT 11  ALKPHOS 63  BILITOT 0.6  PROT 6.9  ALBUMIN 3.6   Recent Labs  Lab 08/24/22 2253  LIPASE 32   No results for input(s): "AMMONIA" in  the last 168 hours. Coagulation Profile: No results for input(s): "INR", "PROTIME" in the last 168 hours. Cardiac Enzymes: No results for input(s): "CKTOTAL", "CKMB", "CKMBINDEX", "TROPONINI" in the last 168 hours. BNP (last 3 results) No results for input(s): "PROBNP" in the last 8760 hours. HbA1C: No results for input(s): "HGBA1C" in the last 72 hours. CBG: No results for input(s): "GLUCAP" in the last 168 hours. Lipid Profile: No results for input(s): "CHOL", "  HDL", "LDLCALC", "TRIG", "CHOLHDL", "LDLDIRECT" in the last 72 hours. Thyroid Function Tests: No results for input(s): "TSH", "T4TOTAL", "FREET4", "T3FREE", "THYROIDAB" in the last 72 hours. Anemia Panel: No results for input(s): "VITAMINB12", "FOLATE", "FERRITIN", "TIBC", "IRON", "RETICCTPCT" in the last 72 hours. Urine analysis:    Component Value Date/Time   COLORURINE PINK (A) 02/18/2022 2033   APPEARANCEUR CLOUDY (A) 02/18/2022 2033   LABSPEC 1.015 02/18/2022 2033   PHURINE 8.5 (H) 02/18/2022 2033   GLUCOSEU 250 (A) 02/18/2022 2033   HGBUR LARGE (A) 02/18/2022 2033   BILIRUBINUR NEGATIVE 02/18/2022 2033   KETONESUR NEGATIVE 02/18/2022 2033   PROTEINUR >300 (A) 02/18/2022 2033   NITRITE POSITIVE (A) 02/18/2022 2033   LEUKOCYTESUR SMALL (A) 02/18/2022 2033    Radiological Exams on Admission: US Pelvis Complete  Result Date: 08/25/2022 CLINICAL DATA:  Left flank pain. EXAM: TRANSABDOMINAL ULTRASOUND OF PELVIS DOPPLER ULTRASOUND OF OVARIES TECHNIQUE: Transabdominal ultrasound examination of the pelvis was performed including evaluation of the uterus, ovaries, adnexal regions, and pelvic cul-de-sac. Color and duplex Doppler ultrasound was utilized to evaluate blood flow to the ovaries. COMPARISON:  02/18/2022 FINDINGS: Uterus Status post hysterectomy Endometrium Not applicable Right ovary Measurements: 3.3 x 2.6 x 2.6 cm = volume: 1.2 mL. Normal appearance/no adnexal mass. Left ovary The left ovary is suboptimally  visualized by transabdominal technique. (Patient refuses transvaginal technique). The left ovary measures 5.5 x 3.9 x 5.5 cm. Previously 5.1 x 4.1 x 5.8 cm. Cyst within the left ovary measures approximately 4.8 cm. Previously this measured 3.7 cm. Pulsed Doppler evaluation demonstrates normal low-resistance arterial and venous waveforms in the right ovary. Suboptimal Doppler evaluation of the left ovary due to limitations of transabdominal technique. Other: No free fluid. IMPRESSION: 1. No acute findings. No evidence for right ovarian torsion. Suboptimal Doppler evaluation of the left ovary. 2. Left ovarian cyst measures approximately 4.8 cm. Previously this measured 3.7 cm. Recommend followup US in 3-6 months. Note: This recommendation does not apply to premenarchal patients or to those with increased risk (genetic, family history, elevated tumor markers or other high-risk factors) of ovarian cancer. Reference: Radiology 2019 Nov; 293(2):359-371. Electronically Signed   By: Kerby Moors M.D.   On: 08/25/2022 09:01   [place] Art/Ven Flow Abd Pelv Doppler  Result Date: 08/25/2022 CLINICAL DATA:  Left flank pain. EXAM: TRANSABDOMINAL ULTRASOUND OF PELVIS DOPPLER ULTRASOUND OF OVARIES TECHNIQUE: Transabdominal ultrasound examination of the pelvis was performed including evaluation of the uterus, ovaries, adnexal regions, and pelvic cul-de-sac. Color and duplex Doppler ultrasound was utilized to evaluate blood flow to the ovaries. COMPARISON:  02/18/2022 FINDINGS: Uterus Status post hysterectomy Endometrium Not applicable Right ovary Measurements: 3.3 x 2.6 x 2.6 cm = volume: 1.2 mL. Normal appearance/no adnexal mass. Left ovary The left ovary is suboptimally visualized by transabdominal technique. (Patient refuses transvaginal technique). The left ovary measures 5.5 x 3.9 x 5.5 cm. Previously 5.1 x 4.1 x 5.8 cm. Cyst within the left ovary measures approximately 4.8 cm. Previously this measured 3.7 cm. Pulsed Doppler  evaluation demonstrates normal low-resistance arterial and venous waveforms in the right ovary. Suboptimal Doppler evaluation of the left ovary due to limitations of transabdominal technique. Other: No free fluid. IMPRESSION: 1. No acute findings. No evidence for right ovarian torsion. Suboptimal Doppler evaluation of the left ovary. 2. Left ovarian cyst measures approximately 4.8 cm. Previously this measured 3.7 cm. Recommend followup US in 3-6 months. Note: This recommendation does not apply to premenarchal patients or to those with increased risk (genetic, family  history, elevated tumor markers or other high-risk factors) of ovarian cancer. Reference: Radiology 2019 Nov; 293(2):359-371. Electronically Signed   By: Kerby Moors M.D.   On: 08/25/2022 09:01   CT Renal Stone Study  Result Date: 08/25/2022 CLINICAL DATA:  Left flank pain. EXAM: CT ABDOMEN AND PELVIS WITHOUT CONTRAST TECHNIQUE: Multidetector CT imaging of the abdomen and pelvis was performed following the standard protocol without IV contrast. RADIATION DOSE REDUCTION: This exam was performed according to the departmental dose-optimization program which includes automated exposure control, adjustment of the mA and/or kV according to patient size and/or use of iterative reconstruction technique. COMPARISON:  June 27, 2021 FINDINGS: Lower chest: There is a small, predominantly anterior pericardial effusion. This measures 6 mm in thickness and represents a new finding when compared to the prior study. Hepatobiliary: A 15 mm diameter cyst is seen within the left lobe of the liver. No gallstones, gallbladder wall thickening, or biliary dilatation. Pancreas: Unremarkable. No pancreatic ductal dilatation or surrounding inflammatory changes. Spleen: Normal in size without focal abnormality. Adrenals/Urinary Tract: Adrenal glands are unremarkable. Kidneys are atrophic in size, without focal lesions. Multiple bilateral subcentimeter nonobstructing renal  calculi are noted. The urinary bladder is poorly distended and subsequently limited in evaluation. Mild urinary bladder wall thickening is seen with a mild amount of surrounding inflammatory fat stranding. Stomach/Bowel: Stomach is within normal limits. Appendix appears normal. No evidence of bowel wall thickening, distention, or inflammatory changes. Vascular/Lymphatic: Aortic atherosclerosis. Subcentimeter para-aortic and aortocaval lymph nodes are noted. Reproductive: Status post hysterectomy. A 5.8 cm x 4.3 cm x 6.8 cm complex left adnexal mass is seen (axial CT images 61 through 74, CT series 3). A 17 mm diameter simple cyst is seen along the medial aspect of the right adnexa (axial CT image 71, CT series 3). Other: No abdominal wall hernia or abnormality. No abdominopelvic ascites. Musculoskeletal: No acute or significant osseous findings. IMPRESSION: 1. Findings which may represent mild cystitis. Correlation with urinalysis is recommended. 2. Multiple bilateral subcentimeter nonobstructing renal calculi. 3. Complex left adnexal mass, as described above. Further evaluation with nonemergent pelvic ultrasound is recommended. 4. Small, predominantly anterior pericardial effusion. 5. Aortic atherosclerosis. Aortic Atherosclerosis (ICD10-I70.0). Electronically Signed   By: Virgina Norfolk M.D.   On: 08/25/2022 02:37    EKG: Ordered Assessment/Plan Principal Problem:   Symptomatic anemia Active Problems:   ESRD on dialysis [facility])   Essential hypertension   Anemia of chronic disease   Anemia   Bilateral ovarian cysts  (please populate well all problems here in Problem List. (For example, if patient is on BP meds at home and you resume or decide to hold them, it is a problem that needs to be her. Same for CAD, COPD, HLD and so on)  Symptomatic anemia -Acute on chronic, likely from worsening of baseline chronic normocytic anemia secondary to ESRD and menorrhagia -Check iron level, may need to go back  to iron supplement at least for short-term -PRBC x 2 -Recheck H&H tomorrow.  Acute left pelvic/flank pain -Likely etiology related to a left adnexal mass which showed some enlargement compared to recent imaging study done at [facility].  Torsion ruled out, no significant rupture, bleeding status inside 1 or more cysts not entirely clear.  Hold off chemical DVT prophylaxis, gynecology on board -Intermatic management with IV Dilaudid PRN.  Currently, no acute surgical abdomen, but will keep patient n.p.o. after midnight in case gynecological intervention needed. -Other DDx, multiple nonobstructive kidney stone found on CT scanning, denies any  dysuria or blood in urine, UA pending.  Recent URI infection -Outpatient COVID, flu study negative as per patient -Continue cefdinir for another 3 days as per outpatient  ESRD on HD home -Nephrology set up nocturnal HD  HTN -Stable, continue amlodipine and Coreg  Hypothyroidism -Continue Synthroid  Morbid obesity, secondary to Cushing's syndrome from chronic steroid use -Calorie control recommended  DVT prophylaxis: SCD Code Status: Full Family Communication: None at bedside Disposition Plan: Expect less than 2 midnight hospital stay, unless gynecologic intervention needed. Consults called: Gynecology, nephrology Admission status: Tele obs  Lequita Halt MD [facility] Pager [phone]  08/25/2022, 1:08 PM

## 2022-08-25 NOTE — Consult Note (Cosign Needed)
[facility] Renal Consultation Note    Indication for Consultation:  Management of ESRD/hemodialysis; anemia, hypertension/volume and secondary hyperparathyroidism  FYB:[id number], [name], NP-C  HPI: Holly [name] is a 37 y.o. 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 Nolon Lennert Singh-[facility]). 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  Seen and examined patient at bedside. She reports her left flank pain started yesterday morning accompanied with fever, chills, and loose stools. She denied ABD pain and N/V. Given her CKD, she makes very little urine and denies dysuria and hematuria. CT renal stone study shows suspicion for mild cystitis, multiple BL non-obstructing renal calculi, and complex left adnexal mass. Transabdominal pelvic US showed no evidence R ovarian torsion. Noted recent intravaginal hysterectomy in October 2023. She subsequently developed a pelvic hematoma which then became infected. She underwent drainage in IR and completed an ABX course 3 weeks ago. Noted current Hgb of 5.4 and 2 units PRBCs were ordered today. Per patient, she's had to receive multiple blood transfusions outpatient d/t heavy menses prior to recent hysterectomy. GYN consulted. She reports her Epogen was recently raised to 30,000 units weekly. She also receives Venofer; however, medication was recently on hold d/t recent ABX course for treatment of URI. Her last hemodialysis was on 08/23/22. Currently she denies SOB, CP, N/V, and remains on RA. Notable labs include: SrCr 11.83,  BUN 50, Hgb 5.4 (2 units PRBCs ordered), K+ 4.9, Na 131, Ca 9.4, and Albumin 3.6. Plan to resume HD on TTS schedule while here. Next HD 08/26/22-1st case.  Past Medical History:  Diagnosis Date   Anemia    Anxiety    Arthritis    hands   Blood transfusion without reported diagnosis    Cancer ([place])    Complication of anesthesia    with Fentanyl she is difficult to awake   Depression    Dialysis patient ([place])    Dyspnea    due to anemia   Epilepsy ([place])    Hashimoto's disease    History of kidney stones    HLD (hyperlipidemia)    Hypertension    Hypothyroidism    Pneumonia    Renal disorder    Thyroid disease    Past Surgical History:  Procedure Laterality Date   ABDOMINAL HYSTERECTOMY     ABDOMINAL SURGERY     AV FISTULA PLACEMENT Left 01/17/2022   Procedure: ARTERIOVENOUS (AV) FISTULA CREATION ( RADIAL CEPHALIC);  Surgeon: Katha Cabal, MD;  Location: [facility] ORS;  Service: Vascular;  Laterality: Left;   CERVICAL BIOPSY  W/ LOOP ELECTRODE EXCISION  2018   COLONOSCOPY N/A 01/30/2022   Procedure: COLONOSCOPY;  Surgeon: Annamaria Helling, DO;  Location: [facility] ENDOSCOPY;  Service: Gastroenterology;  Laterality: N/A;   COLONOSCOPY WITH PROPOFOL N/A 02/01/2022   Procedure: COLONOSCOPY WITH PROPOFOL;  Surgeon: Lesly Rubenstein, MD;  Location: [facility] ENDOSCOPY;  Service: Endoscopy;  Laterality: N/A;   DIAGNOSTIC LAPAROSCOPY     due to removal of PD catheters   DIALYSIS/PERMA CATHETER INSERTION N/A 06/09/2022   Procedure: DIALYSIS/PERMA CATHETER INSERTION;  Surgeon: Algernon Huxley, MD;  Location: [place] CV LAB;  Service: Cardiovascular;  Laterality: N/A;  DIALYSIS/PERMA CATHETER INSERTION N/A 06/30/2022   Procedure: DIALYSIS/PERMA CATHETER INSERTION;  Surgeon: Marty Heck, MD;  Location: [place] CV LAB;  Service: Cardiovascular;  Laterality: N/A;   ESOPHAGOGASTRODUODENOSCOPY N/A 01/30/2022   Procedure: ESOPHAGOGASTRODUODENOSCOPY (EGD);  Surgeon: Annamaria Helling, DO;  Location[facility] ENDOSCOPY;  Service: Gastroenterology;  Laterality: N/A;   ESOPHAGOGASTRODUODENOSCOPY (EGD) WITH PROPOFOL N/A 02/01/2022   Procedure: ESOPHAGOGASTRODUODENOSCOPY (EGD) WITH PROPOFOL;  Surgeon: Lesly Rubenstein, MD;  Location: [facility] ENDOSCOPY;  Service: Endoscopy;  Laterality: N/A;   RENAL BIOPSY     x 2   TUMOR REMOVAL     right face   UTERINE ARTERY EMBOLIZATION  05/04/2022   Family History  Problem Relation Age of Onset   Multiple sclerosis Mother    Cancer Father    COPD Father    Social History:  reports that she has quit smoking. Her smoking use included cigarettes. She has never used smokeless tobacco. She reports that she does not drink alcohol and does not use drugs. Allergies  Allergen Reactions   Ciprofloxacin Anaphylaxis and Other (See Comments)    Caused Seizures   Seizures   Other reaction(s): Other (See Comments)  Seizures  Seizures     Caused Seizures  Seizures  Seizures  Other reaction(s): Other (See Comments) Seizures  Seizures  Seizures     Other reaction(s): Other (See Comments) Seizures  Seizures  Seizures     Caused Seizures  Caused Seizures  Seizures  Seizures  Other reaction(s): Other (See Comments) Seizures  Seizures  Seizures    Seizures Grand mal   Rituximab Anaphylaxis and Swelling   Prior to Admission medications   Medication Sig Start Date End Date Taking? Authorizing Provider  acetaminophen (TYLENOL) 500 MG tablet Take 1,000 mg by mouth every 6 (six) hours as needed for headache (pain).    [provider]  acyclovir (ZOVIRAX) 200 MG capsule Take 200 mg by mouth 2 (two) times daily as needed (out breaks). 08/14/21   [provider]  albuterol (VENTOLIN HFA) 108 (90 Base) MCG/ACT inhaler Inhale 2-4 puffs into the lungs every 4 (four) hours as needed for wheezing (or cough). 04/11/22   Lilland, Alana, DO  amLODipine (NORVASC) 10 MG tablet Take 1 tablet (10 mg total) by mouth daily. 02/02/22    Loletha Grayer, MD  carvedilol (COREG) 12.5 MG tablet Take 12.5 mg by mouth 2 (two) times daily with a meal.    [provider]  cefdinir (OMNICEF) 125 MG/5ML suspension Take 5 mLs (125 mg total) by mouth 2 (two) times daily. 08/21/22   [name], [name], NP-C  cetirizine (ZYRTEC) 10 MG tablet Take 10 mg by mouth at bedtime.    [provider]  cinacalcet (SENSIPAR) 60 MG tablet Take 60 mg by mouth at bedtime.    [provider]  diclofenac Sodium (VOLTAREN) 1 % GEL Apply 1 application  topically 4 (four) times daily as needed (hand,feet,thighs and back pain).    [provider]  gabapentin (NEURONTIN) 100 MG capsule Take 100 mg by mouth 3 (three) times daily. 01/15/22   [provider]  levothyroxine (SYNTHROID) 200 MCG tablet Take 200 mcg by mouth daily before breakfast.    [provider]  ondansetron (ZOFRAN-ODT) 4 MG disintegrating tablet Take 4 mg by mouth every 8 (eight) hours as needed for nausea or vomiting. 01/08/22   [provider]  VELPHORO 500 MG chewable tablet Chew 500 mg by mouth 3 (three) times daily with meals. 02/12/22  [provider]   Current Facility-Administered Medications  Medication Dose Route Frequency Provider Last Rate Last Admin   0.9 %  sodium chloride infusion (Manually program via Guardrails IV Fluids)   Intravenous Once Kommor, Madison, MD       0.9 %  sodium chloride infusion (Manually program via Guardrails IV Fluids)   Intravenous Once Pattricia Boss, MD       0.9 %  sodium chloride infusion (Manually program via Guardrails IV Fluids)   Intravenous Once Tacy Learn, PA-C       acetaminophen (TYLENOL) tablet 650 mg  650 mg Oral Q6H PRN Lequita Halt, MD       Or   acetaminophen (TYLENOL) suppository 650 mg  650 mg Rectal Q6H PRN Lequita Halt, MD       acetaminophen (TYLENOL) tablet 1,000 mg  1,000 mg Oral Q6H PRN Lequita Halt, MD       albuterol (VENTOLIN HFA) 108 (90 Base) MCG/ACT  inhaler 2-4 puff  2-4 puff Inhalation Q4H PRN Wynetta Fines T, MD       amLODipine (NORVASC) tablet 10 mg  10 mg Oral Daily Wynetta Fines T, MD       bisacodyl (DULCOLAX) EC tablet 5 mg  5 mg Oral Daily PRN Wynetta Fines T, MD       carvedilol (COREG) tablet 12.5 mg  12.5 mg Oral BID WC Lequita Halt, MD       cefdinir (OMNICEF) 125 MG/5ML suspension 125 mg  125 mg Oral BID Wynetta Fines T, MD       cinacalcet (SENSIPAR) tablet 60 mg  60 mg Oral QHS Wynetta Fines T, MD       gabapentin (NEURONTIN) capsule 100 mg  100 mg Oral TID Wynetta Fines T, MD       HYDROmorphone (DILAUDID) injection 0.5 mg  0.5 mg Intravenous Q4H PRN Lequita Halt, MD       [START ON 08/26/2022] levothyroxine (SYNTHROID) tablet 200 mcg  200 mcg Oral QAC breakfast Wynetta Fines T, MD       loratadine (CLARITIN) tablet 10 mg  10 mg Oral Daily Wynetta Fines T, MD       ondansetron (ZOFRAN-ODT) disintegrating tablet 4 mg  4 mg Oral Q8H PRN Wynetta Fines T, MD       oxyCODONE (Oxy IR/ROXICODONE) immediate release tablet 5 mg  5 mg Oral Q4H PRN Lequita Halt, MD       senna-docusate (Senokot-S) tablet 1 tablet  1 tablet Oral QHS PRN Lequita Halt, MD       sucroferric oxyhydroxide [facility]) chewable tablet 500 mg  500 mg Oral TID WC Lequita Halt, MD       Current Outpatient Medications  Medication Sig Dispense Refill   acetaminophen (TYLENOL) 500 MG tablet Take 1,000 mg by mouth every 6 (six) hours as needed for headache (pain).     acyclovir (ZOVIRAX) 200 MG capsule Take 200 mg by mouth 2 (two) times daily as needed (out breaks).     albuterol (VENTOLIN HFA) 108 (90 Base) MCG/ACT inhaler Inhale 2-4 puffs into the lungs every 4 (four) hours as needed for wheezing (or cough). 18 g 0   amLODipine (NORVASC) 10 MG tablet Take 1 tablet (10 mg total) by mouth daily. 30 tablet 0   carvedilol (COREG) 12.5 MG tablet Take 12.5 mg by mouth 2 (two) times daily with a meal.     cefdinir (OMNICEF) 125 MG/5ML suspension Take 5  mLs (125 mg total) by mouth 2  (two) times daily. 100 mL 0   cetirizine (ZYRTEC) 10 MG tablet Take 10 mg by mouth at bedtime.     cinacalcet (SENSIPAR) 60 MG tablet Take 60 mg by mouth at bedtime.     diclofenac Sodium (VOLTAREN) 1 % GEL Apply 1 application  topically 4 (four) times daily as needed (hand,feet,thighs and back pain).     gabapentin (NEURONTIN) 100 MG capsule Take 100 mg by mouth 3 (three) times daily.     levothyroxine (SYNTHROID) 200 MCG tablet Take 200 mcg by mouth daily before breakfast.     ondansetron (ZOFRAN-ODT) 4 MG disintegrating tablet Take 4 mg by mouth every 8 (eight) hours as needed for nausea or vomiting.     VELPHORO 500 MG chewable tablet Chew 500 mg by mouth 3 (three) times daily with meals.     Labs: Basic Metabolic Panel: Recent Labs  Lab 08/24/22 2253  NA 131*  K 4.9  CL 92*  CO2 23  GLUCOSE 91  BUN 50*  CREATININE 11.83*  CALCIUM 9.4   Liver Function Tests: Recent Labs  Lab 08/24/22 2253  AST 10*  ALT 11  ALKPHOS 63  BILITOT 0.6  PROT 6.9  ALBUMIN 3.6   Recent Labs  Lab 08/24/22 2253  LIPASE 32   No results for input(s): "AMMONIA" in the last 168 hours. CBC: Recent Labs  Lab 08/21/22 1128 08/24/22 2253 08/25/22 0820  WBC 5.3 7.7  --   NEUTROABS 3.9 6.0  --   HGB 6.0 Repeated and verified X2.* 5.4* 5.2*  HCT 17.2 Repeated and verified X2.* 16.1* 16.2*  MCV 90.5 93.6  --   PLT 169.0 209  --    Cardiac Enzymes: No results for input(s): "CKTOTAL", "CKMB", "CKMBINDEX", "TROPONINI" in the last 168 hours. CBG: No results for input(s): "GLUCAP" in the last 168 hours. Iron Studies: No results for input(s): "IRON", "TIBC", "TRANSFERRIN", "FERRITIN" in the last 72 hours. Studies/Results: US Pelvis Complete  Result Date: 08/25/2022 CLINICAL DATA:  Left flank pain. EXAM: TRANSABDOMINAL ULTRASOUND OF PELVIS DOPPLER ULTRASOUND OF OVARIES TECHNIQUE: Transabdominal ultrasound examination of the pelvis was performed including evaluation of the uterus, ovaries, adnexal  regions, and pelvic cul-de-sac. Color and duplex Doppler ultrasound was utilized to evaluate blood flow to the ovaries. COMPARISON:  02/18/2022 FINDINGS: Uterus Status post hysterectomy Endometrium Not applicable Right ovary Measurements: 3.3 x 2.6 x 2.6 cm = volume: 1.2 mL. Normal appearance/no adnexal mass. Left ovary The left ovary is suboptimally visualized by transabdominal technique. (Patient refuses transvaginal technique). The left ovary measures 5.5 x 3.9 x 5.5 cm. Previously 5.1 x 4.1 x 5.8 cm. Cyst within the left ovary measures approximately 4.8 cm. Previously this measured 3.7 cm. Pulsed Doppler evaluation demonstrates normal low-resistance arterial and venous waveforms in the right ovary. Suboptimal Doppler evaluation of the left ovary due to limitations of transabdominal technique. Other: No free fluid. IMPRESSION: 1. No acute findings. No evidence for right ovarian torsion. Suboptimal Doppler evaluation of the left ovary. 2. Left ovarian cyst measures approximately 4.8 cm. Previously this measured 3.7 cm. Recommend followup US in 3-6 months. Note: This recommendation does not apply to premenarchal patients or to those with increased risk (genetic, family history, elevated tumor markers or other high-risk factors) of ovarian cancer. Reference: Radiology 2019 Nov; 293(2):359-371. Electronically Signed   By: Kerby Moors M.D.   On: 08/25/2022 09:01   [place] Art/Ven Flow Abd Pelv Doppler  Result Date: 08/25/2022 CLINICAL DATA:  Left flank pain. EXAM: TRANSABDOMINAL ULTRASOUND OF PELVIS DOPPLER ULTRASOUND OF OVARIES TECHNIQUE: Transabdominal ultrasound examination of the pelvis was performed including evaluation of the uterus, ovaries, adnexal regions, and pelvic cul-de-sac. Color and duplex Doppler ultrasound was utilized to evaluate blood flow to the ovaries. COMPARISON:  02/18/2022 FINDINGS: Uterus Status post hysterectomy Endometrium Not applicable Right ovary Measurements: 3.3 x 2.6 x 2.6 cm =  volume: 1.2 mL. Normal appearance/no adnexal mass. Left ovary The left ovary is suboptimally visualized by transabdominal technique. (Patient refuses transvaginal technique). The left ovary measures 5.5 x 3.9 x 5.5 cm. Previously 5.1 x 4.1 x 5.8 cm. Cyst within the left ovary measures approximately 4.8 cm. Previously this measured 3.7 cm. Pulsed Doppler evaluation demonstrates normal low-resistance arterial and venous waveforms in the right ovary. Suboptimal Doppler evaluation of the left ovary due to limitations of transabdominal technique. Other: No free fluid. IMPRESSION: 1. No acute findings. No evidence for right ovarian torsion. Suboptimal Doppler evaluation of the left ovary. 2. Left ovarian cyst measures approximately 4.8 cm. Previously this measured 3.7 cm. Recommend followup US in 3-6 months. Note: This recommendation does not apply to premenarchal patients or to those with increased risk (genetic, family history, elevated tumor markers or other high-risk factors) of ovarian cancer. Reference: Radiology 2019 Nov; 293(2):359-371. Electronically Signed   By: Kerby Moors M.D.   On: 08/25/2022 09:01   CT Renal Stone Study  Result Date: 08/25/2022 CLINICAL DATA:  Left flank pain. EXAM: CT ABDOMEN AND PELVIS WITHOUT CONTRAST TECHNIQUE: Multidetector CT imaging of the abdomen and pelvis was performed following the standard protocol without IV contrast. RADIATION DOSE REDUCTION: This exam was performed according to the departmental dose-optimization program which includes automated exposure control, adjustment of the mA and/or kV according to patient size and/or use of iterative reconstruction technique. COMPARISON:  June 27, 2021 FINDINGS: Lower chest: There is a small, predominantly anterior pericardial effusion. This measures 6 mm in thickness and represents a new finding when compared to the prior study. Hepatobiliary: A 15 mm diameter cyst is seen within the left lobe of the liver. No gallstones,  gallbladder wall thickening, or biliary dilatation. Pancreas: Unremarkable. No pancreatic ductal dilatation or surrounding inflammatory changes. Spleen: Normal in size without focal abnormality. Adrenals/Urinary Tract: Adrenal glands are unremarkable. Kidneys are atrophic in size, without focal lesions. Multiple bilateral subcentimeter nonobstructing renal calculi are noted. The urinary bladder is poorly distended and subsequently limited in evaluation. Mild urinary bladder wall thickening is seen with a mild amount of surrounding inflammatory fat stranding. Stomach/Bowel: Stomach is within normal limits. Appendix appears normal. No evidence of bowel wall thickening, distention, or inflammatory changes. Vascular/Lymphatic: Aortic atherosclerosis. Subcentimeter para-aortic and aortocaval lymph nodes are noted. Reproductive: Status post hysterectomy. A 5.8 cm x 4.3 cm x 6.8 cm complex left adnexal mass is seen (axial CT images 61 through 74, CT series 3). A 17 mm diameter simple cyst is seen along the medial aspect of the right adnexa (axial CT image 71, CT series 3). Other: No abdominal wall hernia or abnormality. No abdominopelvic ascites. Musculoskeletal: No acute or significant osseous findings. IMPRESSION: 1. Findings which may represent mild cystitis. Correlation with urinalysis is recommended. 2. Multiple bilateral subcentimeter nonobstructing renal calculi. 3. Complex left adnexal mass, as described above. Further evaluation with nonemergent pelvic ultrasound is recommended. 4. Small, predominantly anterior pericardial effusion. 5. Aortic atherosclerosis. Aortic Atherosclerosis (ICD10-I70.0). Electronically Signed   By: Virgina Norfolk M.D.   On: 08/25/2022 02:37    ROS: All others  negative except those listed in HPI.   Physical Exam: Vitals:   08/25/22 1158 08/25/22 1220 08/25/22 1232 08/25/22 1247  BP: (!) 123/110 (!) 160/98  (!) 163/91  Pulse: 86 75  80  Resp: '16 12  12  '$ Temp: 98.4 F (36.9 C)  97.7 F (36.5 C)  98 F (36.7 C)  TempSrc: Oral Oral Oral Oral  SpO2: 99% 99%  100%     General: WDWN NAD Head: Sclera not icteric MMM Lungs: Diminished bilateral bases and clear anteriorly. No wheeze, rales or rhonchi. Breathing is unlabored. Heart: RRR. No murmur, rubs or gallops.  Abdomen: soft and non-tender  Lower extremities: No edema Neuro: AAOx3. Moves all extremities spontaneously. Dialysis Access: R TDC; L AVF (currently non-functional)  Dialysis Orders:  Reviewed HD records in [facility] from 08/15/22. Plan to still have records faxed over to [place] to verify. Home HD 7days/week- [facility] BFR 400, EDW 99kg, 1K/ 3Ca   Assessment/Plan: Acute left flank pain - likley d/t complex adnexal mass shown on renal stone study, torsion r/o, GYN consulted, U/A ordered for suspicion for mild cystitis but she makes very little urine. Recent URI infection - recently on Cefdinir course and to be completed here. COVID/Flu (-) negative per patient ESRD -  on home hemo 7 days/week (nightly). Last HD 12/30 and 1.5L removed (per patient). Will resume HD on TTS schedule while here. Next HD 08/26/22-1st case. Hypertension/volume  - She doesn't appear to be in severe volume overload and remains on RA. Continue home regimen Amlodipine and Coreg for now Acute on chronic anemia - hx normocytic anemia 2nd CKD and menorrhagia. Iron studies ordered. Hgb today 5.4 and 2 units PRBCs ordered. Will continue ESA. Per patient, Epogen recently raised to 30,000 units weekly and last dose given on 08/23/22. Hold Fe for now while on ABX but will resume once ABX completed. Secondary Hyperparathyroidism -  Will obtain PO4 in AM, Corr Ca okay. Continue Velphoro for now. Nutrition - Renal diet with fluid restriction. Will start protein supplements.  Tobie Poet, NP [facility] 08/25/2022, 1:31 PM    Patient seen and examined, agree with above note with above modifications. Compliant ESRD pt  on home hemo-  presents with flank pain with abnormal findings on CT but unclear source of that pain-  also hgb low but this is not anything new for patient  ( gets weekly epo as OP and has needed transfusions in the past) will provide HD while here-  she understands cannot do daily-  will plan on treatment tomorrow  Corliss Parish, MD 08/26/2022

## 2022-08-26 DIAGNOSIS — D631 Anemia in chronic kidney disease: Secondary | ICD-10-CM | POA: Diagnosis not present

## 2022-08-26 DIAGNOSIS — D638 Anemia in other chronic diseases classified elsewhere: Secondary | ICD-10-CM

## 2022-08-26 DIAGNOSIS — E8779 Other fluid overload: Secondary | ICD-10-CM | POA: Diagnosis not present

## 2022-08-26 DIAGNOSIS — N83202 Unspecified ovarian cyst, left side: Secondary | ICD-10-CM

## 2022-08-26 DIAGNOSIS — I10 Essential (primary) hypertension: Secondary | ICD-10-CM | POA: Diagnosis not present

## 2022-08-26 DIAGNOSIS — N186 End stage renal disease: Secondary | ICD-10-CM | POA: Diagnosis not present

## 2022-08-26 DIAGNOSIS — N83201 Unspecified ovarian cyst, right side: Secondary | ICD-10-CM | POA: Diagnosis not present

## 2022-08-26 DIAGNOSIS — Z87891 Personal history of nicotine dependence: Secondary | ICD-10-CM | POA: Diagnosis not present

## 2022-08-26 DIAGNOSIS — Z992 Dependence on renal dialysis: Secondary | ICD-10-CM | POA: Diagnosis not present

## 2022-08-26 DIAGNOSIS — E039 Hypothyroidism, unspecified: Secondary | ICD-10-CM | POA: Diagnosis not present

## 2022-08-26 DIAGNOSIS — Z859 Personal history of malignant neoplasm, unspecified: Secondary | ICD-10-CM | POA: Diagnosis not present

## 2022-08-26 DIAGNOSIS — D649 Anemia, unspecified: Secondary | ICD-10-CM | POA: Diagnosis not present

## 2022-08-26 DIAGNOSIS — I120 Hypertensive chronic kidney disease with stage 5 chronic kidney disease or end stage renal disease: Secondary | ICD-10-CM | POA: Diagnosis not present

## 2022-08-26 DIAGNOSIS — R109 Unspecified abdominal pain: Secondary | ICD-10-CM | POA: Diagnosis not present

## 2022-08-26 DIAGNOSIS — Z79899 Other long term (current) drug therapy: Secondary | ICD-10-CM | POA: Diagnosis not present

## 2022-08-26 LAB — BASIC METABOLIC PANEL
Anion gap: 19 — ABNORMAL HIGH (ref 5–15)
BUN: 53 mg/dL — ABNORMAL HIGH (ref 6–20)
CO2: 20 mmol/L — ABNORMAL LOW (ref 22–32)
Calcium: 8.9 mg/dL (ref 8.9–10.3)
Chloride: 91 mmol/L — ABNORMAL LOW (ref 98–111)
Creatinine, Ser: 13.54 mg/dL — ABNORMAL HIGH (ref 0.44–1.00)
GFR, Estimated: 3 mL/min — ABNORMAL LOW (ref >60)
Glucose, Bld: 103 mg/dL — ABNORMAL HIGH (ref 70–99)
Potassium: 5.3 mmol/L — ABNORMAL HIGH (ref 3.5–5.1)
Sodium: 130 mmol/L — ABNORMAL LOW (ref 135–145)

## 2022-08-26 LAB — TYPE AND SCREEN
ABO/RH(D): O POS
Antibody Screen: NEGATIVE
Donor AG Type: NEGATIVE
Donor AG Type: NEGATIVE
Unit division: 0
Unit division: 0

## 2022-08-26 LAB — IRON AND TIBC
Iron: 64 ug/dL (ref 28–170)
Saturation Ratios: 25 % (ref 10.4–31.8)
TIBC: 260 ug/dL (ref 250–450)
UIBC: 196 ug/dL

## 2022-08-26 LAB — PHOSPHORUS: Phosphorus: 9 mg/dL — ABNORMAL HIGH (ref 2.5–4.6)

## 2022-08-26 LAB — BPAM RBC
Blood Product Expiration Date: 202402032359
Blood Product Expiration Date: 202402032359
ISSUE DATE / TIME: 202401010925
ISSUE DATE / TIME: 202401011209
Unit Type and Rh: 5100
Unit Type and Rh: 5100

## 2022-08-26 LAB — CBC
HCT: 21.6 % — ABNORMAL LOW (ref 36.0–46.0)
Hemoglobin: 7.4 g/dL — ABNORMAL LOW (ref 12.0–15.0)
MCH: 31.5 pg (ref 26.0–34.0)
MCHC: 34.3 g/dL (ref 30.0–36.0)
MCV: 91.9 fL (ref 80.0–100.0)
Platelets: 244 10*3/uL (ref 150–400)
RBC: 2.35 MIL/uL — ABNORMAL LOW (ref 3.87–5.11)
RDW: 17 % — ABNORMAL HIGH (ref 11.5–15.5)
WBC: 7.4 10*3/uL (ref 4.0–10.5)
nRBC: 0 % (ref 0.0–0.2)

## 2022-08-26 LAB — RETICULOCYTES
Immature Retic Fract: 25.5 % — ABNORMAL HIGH (ref 2.3–15.9)
RBC.: 2.37 MIL/uL — ABNORMAL LOW (ref 3.87–5.11)
Retic Count, Absolute: 78.9 10*3/uL (ref 19.0–186.0)
Retic Ct Pct: 3.3 % — ABNORMAL HIGH (ref 0.4–3.1)

## 2022-08-26 LAB — FERRITIN: Ferritin: 362 ng/mL — ABNORMAL HIGH (ref 11–307)

## 2022-08-26 LAB — HEPATITIS B SURFACE ANTIGEN: Hepatitis B Surface Ag: NONREACTIVE

## 2022-08-26 MED ORDER — HEPARIN SODIUM (PORCINE) 1000 UNIT/ML DIALYSIS
1000.0000 [IU] | INTRAMUSCULAR | Status: DC | PRN
  Filled 2022-08-26: qty 1

## 2022-08-26 MED ORDER — ALTEPLASE 2 MG IJ SOLR: 2.0000 mg | Freq: Once | INTRAMUSCULAR | Status: DC | PRN

## 2022-08-26 MED ORDER — EPOETIN ALFA 40000 UNIT/ML IJ SOLN: 30000.0000 [IU] | Freq: Once | INTRAMUSCULAR | Status: DC

## 2022-08-26 NOTE — Telephone Encounter (Signed)
FYI.. pt did receive 2 units of blood

## 2022-08-26 NOTE — Progress Notes (Signed)
D/C order noted. Contacted [facility]) and spoke to home therapy staff. Clinic advised pt will d/c today and will resume home HD. D/C summary and last renal note faxed to clinic for continuation of care.   Melven Sartorius Renal Navigator [phone]

## 2022-08-26 NOTE — Procedures (Signed)
Patient was seen on dialysis and the procedure was supervised.  BFR 400  Via TDC BP is  147/88.   Patient appears to be tolerating treatment well  Louis Meckel 08/26/2022

## 2022-08-26 NOTE — Progress Notes (Signed)
Subjective:  admitted to obs-  seen on HD-  wanted to challenge which so far is going well. Belly is OK  Objective Vital signs in last 24 hours: Vitals:   08/26/22 0830 08/26/22 0900 08/26/22 0930 08/26/22 1000  BP: (!) 145/95 (!) 129/94 (!) 128/92 (!) 147/88  Pulse: 83 79 79 81  Resp: (!) '27 19 15 11  '$ Temp:      TempSrc:      SpO2:      Weight:       Weight change:   Intake/Output Summary (Last 24 hours) at 08/26/2022 1022 Last data filed at 08/25/2022 1455 Gross per 24 hour  Intake 630 ml  Output --  Net 630 ml    Dialysis Orders:  Reviewed HD records in [facility] from 08/15/22. Plan to still have records faxed over to [place] to verify. Home HD 7days/week- [facility] BFR 400, EDW 99kg, 1K/ 3Ca     Assessment/Plan: Acute left flank pain - likley d/t complex adnexal mass shown on renal stone study, torsion r/o, GYN consulted, U/A ordered for suspicion for mild cystitis but she makes very little urine. Pain has improved Recent URI infection - recently on Cefdinir course and to be completed here. COVID/Flu (-) negative per patient ESRD -  on home hemo 7 days/week (nightly). Last HD 12/30 and 1.5L removed (per patient). Will resume HD on TTS schedule while here.HD today-  if stays will next need on Thursday  Hypertension/volume  - She doesn't appear to be in severe volume overload and remains on RA. 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 Iron studies ordered. Hgb today 5.4 and 2 units PRBCs ordered. Will continue ESA. Per patient, Epogen recently raised to 30,000 units weekly -  will dose here today. Hold Fe for now while on ABX but will resume once ABX completed.  Hgb bumped appropriately with transfusion Secondary Hyperparathyroidism -  PO4 is 9, Corr Ca okay. Continue Velphoro  Nutrition - Renal diet with fluid restriction. Will start protein  supplements. Dispo-  if pain controlled she could be discharged from our standpoint to continue with her home HD and home epo reg   [place]: Basic Metabolic Panel: Recent Labs  Lab 08/24/22 2253 08/26/22 0322  NA 131* 130*  K 4.9 5.3*  CL 92* 91*  CO2 23 20*  GLUCOSE 91 103*  BUN 50* 53*  CREATININE 11.83* 13.54*  CALCIUM 9.4 8.9  PHOS  --  9.0*   Liver Function Tests: Recent Labs  Lab 08/24/22 2253  AST 10*  ALT 11  ALKPHOS 63  BILITOT 0.6  PROT 6.9  ALBUMIN 3.6   Recent Labs  Lab 08/24/22 2253  LIPASE 32   No results for input(s): "AMMONIA" in the last 168 hours. CBC: Recent Labs  Lab 08/21/22 1128 08/24/22 2253 08/25/22 0820 08/26/22 0322  WBC 5.3 7.7  --  7.4  NEUTROABS 3.9 6.0  --   --   HGB 6.0 Repeated and verified X2.* 5.4* 5.2* 7.4*  HCT 17.2 Repeated and verified X2.* 16.1* 16.2* 21.6*  MCV 90.5 93.6  --  91.9  PLT 169.0 209  --  244   Cardiac Enzymes: No results for input(s): "CKTOTAL", "CKMB", "CKMBINDEX", "TROPONINI" in the last 168 hours. CBG: No results for input(s): "GLUCAP" in the last 168 hours.  Iron  Studies:  Recent Labs    08/26/22 0322  IRON 64  TIBC 260  FERRITIN 362*   Studies/Results: US Pelvis Complete  Result Date: 08/25/2022 CLINICAL DATA:  Left flank pain. EXAM: TRANSABDOMINAL ULTRASOUND OF PELVIS DOPPLER ULTRASOUND OF OVARIES TECHNIQUE: Transabdominal ultrasound examination of the pelvis was performed including evaluation of the uterus, ovaries, adnexal regions, and pelvic cul-de-sac. Color and duplex Doppler ultrasound was utilized to evaluate blood flow to the ovaries. COMPARISON:  02/18/2022 FINDINGS: Uterus Status post hysterectomy Endometrium Not applicable Right ovary Measurements: 3.3 x 2.6 x 2.6 cm = volume: 1.2 mL. Normal appearance/no adnexal mass. Left ovary The left ovary is suboptimally visualized by transabdominal technique. (Patient refuses transvaginal technique). The left ovary  measures 5.5 x 3.9 x 5.5 cm. Previously 5.1 x 4.1 x 5.8 cm. Cyst within the left ovary measures approximately 4.8 cm. Previously this measured 3.7 cm. Pulsed Doppler evaluation demonstrates normal low-resistance arterial and venous waveforms in the right ovary. Suboptimal Doppler evaluation of the left ovary due to limitations of transabdominal technique. Other: No free fluid. IMPRESSION: 1. No acute findings. No evidence for right ovarian torsion. Suboptimal Doppler evaluation of the left ovary. 2. Left ovarian cyst measures approximately 4.8 cm. Previously this measured 3.7 cm. Recommend followup US in 3-6 months. Note: This recommendation does not apply to premenarchal patients or to those with increased risk (genetic, family history, elevated tumor markers or other high-risk factors) of ovarian cancer. Reference: Radiology 2019 Nov; 293(2):359-371. Electronically Signed   By: Kerby Moors M.D.   On: 08/25/2022 09:01   [place] Art/Ven Flow Abd Pelv Doppler  Result Date: 08/25/2022 CLINICAL DATA:  Left flank pain. EXAM: TRANSABDOMINAL ULTRASOUND OF PELVIS DOPPLER ULTRASOUND OF OVARIES TECHNIQUE: Transabdominal ultrasound examination of the pelvis was performed including evaluation of the uterus, ovaries, adnexal regions, and pelvic cul-de-sac. Color and duplex Doppler ultrasound was utilized to evaluate blood flow to the ovaries. COMPARISON:  02/18/2022 FINDINGS: Uterus Status post hysterectomy Endometrium Not applicable Right ovary Measurements: 3.3 x 2.6 x 2.6 cm = volume: 1.2 mL. Normal appearance/no adnexal mass. Left ovary The left ovary is suboptimally visualized by transabdominal technique. (Patient refuses transvaginal technique). The left ovary measures 5.5 x 3.9 x 5.5 cm. Previously 5.1 x 4.1 x 5.8 cm. Cyst within the left ovary measures approximately 4.8 cm. Previously this measured 3.7 cm. Pulsed Doppler evaluation demonstrates normal low-resistance arterial and venous waveforms in the right ovary.  Suboptimal Doppler evaluation of the left ovary due to limitations of transabdominal technique. Other: No free fluid. IMPRESSION: 1. No acute findings. No evidence for right ovarian torsion. Suboptimal Doppler evaluation of the left ovary. 2. Left ovarian cyst measures approximately 4.8 cm. Previously this measured 3.7 cm. Recommend followup US in 3-6 months. Note: This recommendation does not apply to premenarchal patients or to those with increased risk (genetic, family history, elevated tumor markers or other high-risk factors) of ovarian cancer. Reference: Radiology 2019 Nov; 293(2):359-371. Electronically Signed   By: Kerby Moors M.D.   On: 08/25/2022 09:01   CT Renal Stone Study  Result Date: 08/25/2022 CLINICAL DATA:  Left flank pain. EXAM: CT ABDOMEN AND PELVIS WITHOUT CONTRAST TECHNIQUE: Multidetector CT imaging of the abdomen and pelvis was performed following the standard protocol without IV contrast. RADIATION DOSE REDUCTION: This exam was performed according to the departmental dose-optimization program which includes automated exposure control, adjustment of the mA and/or kV according to patient size and/or use of iterative reconstruction technique. COMPARISON:  June 27, 2021 FINDINGS: Lower  chest: There is a small, predominantly anterior pericardial effusion. This measures 6 mm in thickness and represents a new finding when compared to the prior study. Hepatobiliary: A 15 mm diameter cyst is seen within the left lobe of the liver. No gallstones, gallbladder wall thickening, or biliary dilatation. Pancreas: Unremarkable. No pancreatic ductal dilatation or surrounding inflammatory changes. Spleen: Normal in size without focal abnormality. Adrenals/Urinary Tract: Adrenal glands are unremarkable. Kidneys are atrophic in size, without focal lesions. Multiple bilateral subcentimeter nonobstructing renal calculi are noted. The urinary bladder is poorly distended and subsequently limited in  evaluation. Mild urinary bladder wall thickening is seen with a mild amount of surrounding inflammatory fat stranding. Stomach/Bowel: Stomach is within normal limits. Appendix appears normal. No evidence of bowel wall thickening, distention, or inflammatory changes. Vascular/Lymphatic: Aortic atherosclerosis. Subcentimeter para-aortic and aortocaval lymph nodes are noted. Reproductive: Status post hysterectomy. A 5.8 cm x 4.3 cm x 6.8 cm complex left adnexal mass is seen (axial CT images 61 through 74, CT series 3). A 17 mm diameter simple cyst is seen along the medial aspect of the right adnexa (axial CT image 71, CT series 3). Other: No abdominal wall hernia or abnormality. No abdominopelvic ascites. Musculoskeletal: No acute or significant osseous findings. IMPRESSION: 1. Findings which may represent mild cystitis. Correlation with urinalysis is recommended. 2. Multiple bilateral subcentimeter nonobstructing renal calculi. 3. Complex left adnexal mass, as described above. Further evaluation with nonemergent pelvic ultrasound is recommended. 4. Small, predominantly anterior pericardial effusion. 5. Aortic atherosclerosis. Aortic Atherosclerosis (ICD10-I70.0). Electronically Signed   By: Virgina Norfolk M.D.   On: 08/25/2022 02:37   Medications: Infusions:   Scheduled Medications:  sodium chloride   Intravenous Once   sodium chloride   Intravenous Once   sodium chloride   Intravenous Once   amLODipine  10 mg Oral Daily   carvedilol  12.5 mg Oral BID WC   cefdinir  125 mg Oral BID   Chlorhexidine Gluconate Cloth  6 each Topical Q0600   cinacalcet  60 mg Oral Q supper   gabapentin  100 mg Oral TID   levothyroxine  200 mcg Oral QAC breakfast   loratadine  10 mg Oral Daily   sucroferric oxyhydroxide  500 mg Oral TID WC    have reviewed scheduled and prn medications.  Physical Exam: General:  NAD Heart: RRR Lungs: mostly clear Abdomen: soft, non tender at present Extremities: min  edema Dialysis Access: [facility]  but also left AVF is patent    08/26/2022,10:22 AM  LOS: 0 days

## 2022-08-26 NOTE — Telephone Encounter (Signed)
Pt sent mychart message that she was able to get 2 units of blood

## 2022-08-26 NOTE — Discharge Summary (Signed)
Physician Discharge Summary  Holly [name] WUJ:[id number] DOB: May 07, 1986 DOA: 08/24/2022  PCP: Girtha Rm, NP-C  Admit date: 08/24/2022 Discharge date: 08/26/2022  Admitted From: Home  Discharge disposition: Home   Recommendations for Outpatient Follow-Up:   Follow up with your primary care provider in one week.  Check CBC, BMP, magnesium in the next visit Continue hemodialysis at home and follow-up with nephrology as outpatient.  Patient does have follow-up imaging planned with gynecologist at [facility] in 1 month.  Discharge Diagnosis:   Principal Problem:   Symptomatic anemia Active Problems:   ESRD on dialysis [facility])   Essential hypertension   Anemia of chronic disease   Anemia   Bilateral ovarian cysts   Discharge Condition: Improved.  Diet recommendation: Low sodium, heart healthy.    Wound care: None.  Code status: Full.   History of Present Illness:   Holly [name] is a 37 y.o. 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  For the last 1 week, patient has been having some sore throat with productive cough low-grade fever in had been to her primary care physician 4 days prior to this presentation when she was given cefdinir for 7 days.  At this time she continues to complain generalized weakness with exertional dyspnea.  In the ED patient was  afebrile.  CT scan of the abdomen pelvis showed some cystitis and multiple bilateral subcentimeter nonobstructive renal stone and complex adnexal mass.  Doppler study showed left ovarian cyst 4.8 cm increased from previous 3.7 without any torsion.  Blood work showed creatinine at 11.0.  Hemoglobin was 5.4 compared to 6.0 last week.  2 units of packed RBC was ordered in the ED, gynecology was consulted and patient was admitted hospital for further evaluation and treatment.  Hospital Course:   Following conditions were addressed during hospitalization as listed below,  Symptomatic anemia -Acute on chronic likely from ESRD with history of menorrhagia.  Iron panel reviewed with serum iron of 64 and ferritin 362.  Received 2 units of packed RBC.  Hemoglobin today at 7.4.  Initial hemoglobin at 5.2.  No active bleeding at this time.  Will benefit from ESA as outpatient.  Recent upper respiratory infection.  Was on cefdinir as outpatient.Marland Kitchen  COVID and flu negative as per the patient.  Acute left pelvic/flank pain CT scan showed some left adnexal mass but ultrasound did not show any torsion.  Gynecology was consulted.  Patient also has nonobstructive kidney stones.  Gynecology thought possibility of cyst rupture but pain control is what is recommended.  Patient does have follow-up imaging planned with gynecologist at [facility] in 1 month.   ESRD on HD home Nephrology was consulted for hemodialysis needs.  Received  hemodialysis today.  Will resume hemodialysis at home on discharge.  Essential hypertension Continue amlodipine and Coreg.  Latest blood pressure at 140/94   Hypothyroidism -Continue Synthroid   Morbid obesity, secondary to Cushing's syndrome from chronic steroid use Supportive care.  Disposition.  At  this time, patient is stable for disposition home with outpatient PCP/nephrology follow-up.  Medical Consultants:   Nephrology  Procedures:    Hemodialysis Transfusion of packed RBC 2  units Subjective:   Today, patient was seen and examined at bedside.  Denies any nausea, vomiting, fever, chills or rigor.  Denies any vaginal bleeding, GI bleeding.  Receiving hemodialysis at the time of my exam.  Denies overt breathing issues dyspnea.  Discharge Exam:   Vitals:   08/26/22 1110 08/26/22 1126  BP: (!) 154/99 (!) 140/94  Pulse: 77 61  Resp: 19 15  Temp:  98.1 F (36.7 C)  SpO2: 100% 100%   Vitals:   08/26/22 1043 08/26/22 1100 08/26/22 1110 08/26/22 1126  BP: (!) 154/88 (!) 151/93 (!) 154/99 (!) 140/94  Pulse: 79 77 77 61  Resp: '16 13 19 15  '$ Temp:    98.1 F (36.7 C)  TempSrc:    Oral  SpO2:  99% 100% 100%  Weight:    98 kg   Body mass index is 33.84 kg/m.  General: Alert awake, not in obvious distress, obese HENT: pupils equally reacting to light,  No scleral pallor or icterus noted. Oral mucosa is moist.  Chest:  .  Diminished breath sounds bilaterally. No crackles or wheezes.  Right internal jugular dialysis catheter in place.   CVS: S1 &S2 heard. No murmur.  Regular rate and rhythm. Abdomen: Soft, nontender, nondistended.  Bowel sounds are heard.   Extremities: No cyanosis, clubbing or edema.  Peripheral pulses are palpable.  Left forearm fistula in place.  Right internal jugular hemodialysis catheter in place. Psych: Alert, awake and oriented, normal mood CNS:  No cranial nerve deficits.  Power equal in all extremities.   Skin: Warm and dry.  No rashes noted.  The results of significant diagnostics from this hospitalization (including imaging, microbiology, ancillary and laboratory) are listed below for reference.     Diagnostic Studies:   US Pelvis Complete  Result Date: 08/25/2022 CLINICAL DATA:  Left flank pain. EXAM: TRANSABDOMINAL ULTRASOUND OF PELVIS DOPPLER ULTRASOUND OF OVARIES TECHNIQUE: Transabdominal ultrasound examination of the pelvis was performed including evaluation of the uterus, ovaries, adnexal regions, and pelvic cul-de-sac. Color  and duplex Doppler ultrasound was utilized to evaluate blood flow to the ovaries. COMPARISON:  02/18/2022 FINDINGS: Uterus Status post hysterectomy Endometrium Not applicable Right ovary Measurements: 3.3 x 2.6 x 2.6 cm = volume: 1.2 mL. Normal appearance/no adnexal mass. Left ovary The left ovary is suboptimally visualized by transabdominal technique. (Patient refuses transvaginal technique). The left ovary measures 5.5 x 3.9 x 5.5 cm. Previously 5.1 x 4.1 x 5.8 cm. Cyst within the left ovary measures approximately 4.8 cm. Previously this measured 3.7 cm. Pulsed Doppler evaluation demonstrates normal low-resistance arterial and venous waveforms in the right ovary. Suboptimal Doppler evaluation of the left ovary due to limitations of transabdominal technique. Other: No free fluid. IMPRESSION: 1. No acute findings. No evidence for right ovarian torsion. Suboptimal Doppler evaluation of the left ovary. 2. Left ovarian cyst measures approximately 4.8 cm. Previously this measured 3.7 cm. Recommend followup US in 3-6 months. Note: This recommendation does not apply to premenarchal patients or to those with increased risk (genetic, family history, elevated tumor markers or other high-risk factors) of ovarian cancer. Reference: Radiology 2019 Nov; 293(2):359-371. Electronically Signed   By: Kerby Moors M.D.   On: 08/25/2022 09:01   [place] Art/Ven Flow Abd Pelv Doppler  Result Date: 08/25/2022 CLINICAL DATA:  Left flank pain. EXAM: TRANSABDOMINAL  ULTRASOUND OF PELVIS DOPPLER ULTRASOUND OF OVARIES TECHNIQUE: Transabdominal ultrasound examination of the pelvis was performed including evaluation of the uterus, ovaries, adnexal regions, and pelvic cul-de-sac. Color and duplex Doppler ultrasound was utilized to evaluate blood flow to the ovaries. COMPARISON:  02/18/2022 FINDINGS: Uterus Status post hysterectomy Endometrium Not applicable Right ovary Measurements: 3.3 x 2.6 x 2.6 cm = volume: 1.2 mL. Normal appearance/no  adnexal mass. Left ovary The left ovary is suboptimally visualized by transabdominal technique. (Patient refuses transvaginal technique). The left ovary measures 5.5 x 3.9 x 5.5 cm. Previously 5.1 x 4.1 x 5.8 cm. Cyst within the left ovary measures approximately 4.8 cm. Previously this measured 3.7 cm. Pulsed Doppler evaluation demonstrates normal low-resistance arterial and venous waveforms in the right ovary. Suboptimal Doppler evaluation of the left ovary due to limitations of transabdominal technique. Other: No free fluid. IMPRESSION: 1. No acute findings. No evidence for right ovarian torsion. Suboptimal Doppler evaluation of the left ovary. 2. Left ovarian cyst measures approximately 4.8 cm. Previously this measured 3.7 cm. Recommend followup US in 3-6 months. Note: This recommendation does not apply to premenarchal patients or to those with increased risk (genetic, family history, elevated tumor markers or other high-risk factors) of ovarian cancer. Reference: Radiology 2019 Nov; 293(2):359-371. Electronically Signed   By: Kerby Moors M.D.   On: 08/25/2022 09:01   CT Renal Stone Study  Result Date: 08/25/2022 CLINICAL DATA:  Left flank pain. EXAM: CT ABDOMEN AND PELVIS WITHOUT CONTRAST TECHNIQUE: Multidetector CT imaging of the abdomen and pelvis was performed following the standard protocol without IV contrast. RADIATION DOSE REDUCTION: This exam was performed according to the departmental dose-optimization program which includes automated exposure control, adjustment of the mA and/or kV according to patient size and/or use of iterative reconstruction technique. COMPARISON:  June 27, 2021 FINDINGS: Lower chest: There is a small, predominantly anterior pericardial effusion. This measures 6 mm in thickness and represents a new finding when compared to the prior study. Hepatobiliary: A 15 mm diameter cyst is seen within the left lobe of the liver. No gallstones, gallbladder wall thickening, or biliary  dilatation. Pancreas: Unremarkable. No pancreatic ductal dilatation or surrounding inflammatory changes. Spleen: Normal in size without focal abnormality. Adrenals/Urinary Tract: Adrenal glands are unremarkable. Kidneys are atrophic in size, without focal lesions. Multiple bilateral subcentimeter nonobstructing renal calculi are noted. The urinary bladder is poorly distended and subsequently limited in evaluation. Mild urinary bladder wall thickening is seen with a mild amount of surrounding inflammatory fat stranding. Stomach/Bowel: Stomach is within normal limits. Appendix appears normal. No evidence of bowel wall thickening, distention, or inflammatory changes. Vascular/Lymphatic: Aortic atherosclerosis. Subcentimeter para-aortic and aortocaval lymph nodes are noted. Reproductive: Status post hysterectomy. A 5.8 cm x 4.3 cm x 6.8 cm complex left adnexal mass is seen (axial CT images 61 through 74, CT series 3). A 17 mm diameter simple cyst is seen along the medial aspect of the right adnexa (axial CT image 71, CT series 3). Other: No abdominal wall hernia or abnormality. No abdominopelvic ascites. Musculoskeletal: No acute or significant osseous findings. IMPRESSION: 1. Findings which may represent mild cystitis. Correlation with urinalysis is recommended. 2. Multiple bilateral subcentimeter nonobstructing renal calculi. 3. Complex left adnexal mass, as described above. Further evaluation with nonemergent pelvic ultrasound is recommended. 4. Small, predominantly anterior pericardial effusion. 5. Aortic atherosclerosis. Aortic Atherosclerosis (ICD10-I70.0). Electronically Signed   By: Virgina Norfolk M.D.   On: 08/25/2022 02:37     Labs:   Basic Metabolic Panel: Recent  Labs  Lab 08/24/22 2253 08/26/22 0322  NA 131* 130*  K 4.9 5.3*  CL 92* 91*  CO2 23 20*  GLUCOSE 91 103*  BUN 50* 53*  CREATININE 11.83* 13.54*  CALCIUM 9.4 8.9  PHOS  --  9.0*   GFR Estimated Creatinine Clearance: 7 mL/min  (A) (by C-G formula based on SCr of 13.54 mg/dL (H)). Liver Function Tests: Recent Labs  Lab 08/24/22 2253  AST 10*  ALT 11  ALKPHOS 63  BILITOT 0.6  PROT 6.9  ALBUMIN 3.6   Recent Labs  Lab 08/24/22 2253  LIPASE 32   No results for input(s): "AMMONIA" in the last 168 hours. Coagulation profile No results for input(s): "INR", "PROTIME" in the last 168 hours.  CBC: Recent Labs  Lab 08/21/22 1128 08/24/22 2253 08/25/22 0820 08/26/22 0322  WBC 5.3 7.7  --  7.4  NEUTROABS 3.9 6.0  --   --   HGB 6.0 Repeated and verified X2.* 5.4* 5.2* 7.4*  HCT 17.2 Repeated and verified X2.* 16.1* 16.2* 21.6*  MCV 90.5 93.6  --  91.9  PLT 169.0 209  --  244   Cardiac Enzymes: No results for input(s): "CKTOTAL", "CKMB", "CKMBINDEX", "TROPONINI" in the last 168 hours. BNP: Invalid input(s): "POCBNP" CBG: No results for input(s): "GLUCAP" in the last 168 hours. D-Dimer No results for input(s): "DDIMER" in the last 72 hours. Hgb A1c No results for input(s): "HGBA1C" in the last 72 hours. Lipid Profile No results for input(s): "CHOL", "HDL", "LDLCALC", "TRIG", "CHOLHDL", "LDLDIRECT" in the last 72 hours. Thyroid function studies No results for input(s): "TSH", "T4TOTAL", "T3FREE", "THYROIDAB" in the last 72 hours.  Invalid input(s): "FREET3" Anemia work up Recent Labs    08/26/22 0322  FERRITIN 362*  TIBC 260  IRON 64  RETICCTPCT 3.3*   Microbiology No results found for this or any previous visit (from the past 240 hour(s)).   Discharge Instructions:   Discharge Instructions     Diet - low sodium heart healthy   Complete by: As directed    Discharge instructions   Complete by: As directed    Follow-up with your primary care provider in 1 week.  Check blood work at that time.  Continue hemodialysis as outpatient.   Increase activity slowly   Complete by: As directed       Allergies as of 08/26/2022       Reactions   Ciprofloxacin Anaphylaxis, Other (See Comments)    Caused Seizures  Seizures  Other reaction(s): Other (See Comments) Seizures Seizures     Caused Seizures  Seizures  Seizures  Other reaction(s): Other (See Comments) Seizures  Seizures  Seizures     Other reaction(s): Other (See Comments) Seizures  Seizures  Seizures     Caused Seizures  Caused Seizures  Seizures  Seizures  Other reaction(s): Other (See Comments) Seizures  Seizures  Seizures    Seizures Grand mal   Rituximab Anaphylaxis, Swelling        Medication List     TAKE these medications    acetaminophen 500 MG tablet Commonly known as: TYLENOL Take 1,000 mg by mouth every 6 (six) hours as needed for headache (pain).   acyclovir 200 MG capsule Commonly known as: ZOVIRAX Take 200 mg by mouth 2 (two) times daily as needed (out breaks).   albuterol 108 (90 Base) MCG/ACT inhaler Commonly known as: VENTOLIN HFA Inhale 2-4 puffs into the lungs every 4 (four) hours as needed for wheezing (or cough).  amLODipine 10 MG tablet Commonly known as: NORVASC Take 1 tablet (10 mg total) by mouth daily.   carvedilol 12.5 MG tablet Commonly known as: COREG Take 12.5 mg by mouth 2 (two) times daily with a meal.   cefdinir 125 MG/5ML suspension Commonly known as: OMNICEF Take 5 mLs (125 mg total) by mouth 2 (two) times daily.   cetirizine 10 MG tablet Commonly known as: ZYRTEC Take 10 mg by mouth at bedtime.   cinacalcet 60 MG tablet Commonly known as: SENSIPAR Take 60 mg by mouth at bedtime.   diclofenac Sodium 1 % Gel Commonly known as: VOLTAREN Apply 1 application  topically 4 (four) times daily as needed (hand,feet,thighs and back pain).   epoetin alfa 10000 UNIT/ML injection Commonly known as: EPOGEN Inject 10,000 Units into the skin once a week. saturdays   epoetin alfa 20000 UNIT/ML injection Commonly known as: EPOGEN Inject 20,000 Units into the skin once a week. Saturdays   gabapentin 100 MG capsule Commonly known as: NEURONTIN Take 100 mg by  mouth 3 (three) times daily.   levothyroxine 200 MCG tablet Commonly known as: SYNTHROID Take 200 mcg by mouth daily before breakfast.   ondansetron 4 MG disintegrating tablet Commonly known as: ZOFRAN-ODT Take 4 mg by mouth every 8 (eight) hours as needed for nausea or vomiting.   Velphoro 500 MG chewable tablet Generic drug: sucroferric oxyhydroxide Chew 500 mg by mouth 3 (three) times daily with meals.          Time coordinating discharge: 39 minutes  Signed:  Idara [name]  [facility] 08/26/2022, 1:56 PM

## 2022-08-26 NOTE — Progress Notes (Signed)
   08/26/22 1126  Vitals  Temp 98.1 F (36.7 C)  Temp Source Oral  BP (!) 140/94  MAP (mmHg) 110  BP Location Right Arm  BP Method Automatic  Patient Position (if appropriate) Lying  Pulse Rate 61  Pulse Rate Source Monitor  ECG Heart Rate 77  Resp 15  Oxygen Therapy  SpO2 100 %  O2 Device Room Air  Pulse Oximetry Type Continuous   Received patient in bed to unit.  Alert and oriented.  Informed consent signed and in chart.   Treatment initiated: [place] Treatment completed: 1110  Patient tolerated well.  Transported back to the room  Alert, without acute distress.  Hand-off given to patient's nurse.   Access used: HD cath  Access issues: NA  Total UF removed: 3523m Medication(s) given: Heparin Dwells 3200 units Post HD VS: see above Post HD weight: 98kg   [facility]

## 2022-08-26 NOTE — ED Notes (Signed)
Pt ambulated to bathroom without assistance 

## 2022-08-27 ENCOUNTER — Telehealth: Payer: Self-pay

## 2022-08-27 DIAGNOSIS — Z992 Dependence on renal dialysis: Secondary | ICD-10-CM | POA: Diagnosis not present

## 2022-08-27 DIAGNOSIS — E8779 Other fluid overload: Secondary | ICD-10-CM | POA: Diagnosis not present

## 2022-08-27 DIAGNOSIS — N186 End stage renal disease: Secondary | ICD-10-CM | POA: Diagnosis not present

## 2022-08-27 LAB — HEPATITIS B SURFACE ANTIBODY, QUANTITATIVE: Hep B S AB Quant (Post): 12.4 m[IU]/mL (ref >9.9)

## 2022-08-27 NOTE — Telephone Encounter (Signed)
Transition Care Management Follow-up Telephone Call Date of discharge and from where: [place] 08/26/2022 How have you been since you were released from the hospital? Better, still some pain Any questions or concerns? No  Items Reviewed: Did the pt receive and understand the discharge instructions provided? Yes  Medications obtained and verified? Yes  Other? No  Any new allergies since your discharge? No  Dietary orders reviewed? Yes Do you have support at home? Yes   Home Care and Equipment/Supplies: Were home health services ordered? no If so, what is the name of the agency? N/a  Has the agency set up a time to come to the patient's home? not applicable Were any new equipment or medical supplies ordered?  No What is the name of the medical supply agency? N/a Were you able to get the supplies/equipment? not applicable Do you have any questions related to the use of the equipment or supplies? No  Functional Questionnaire: (I = Independent and D = Dependent) ADLs: I  Bathing/Dressing- I  Meal Prep- I  Eating- I  Maintaining continence- I  Transferring/Ambulation- I  Managing Meds- I  Follow up appointments reviewed:  PCP Hospital f/u appt confirmed? Yes  Scheduled to see Harland Dingwall on 08/28/2022 @ 1:40. [place] [facility] f/u appt confirmed? No  Are transportation arrangements needed? No  If their condition worsens, is the pt aware to call PCP or go to the Emergency Dept.? Yes Was the patient provided with contact information for the PCP's office or ED? Yes Was to pt encouraged to call back with questions or concerns? Yes  Juanda Crumble, LPN [facility] [phone]

## 2022-08-28 ENCOUNTER — Ambulatory Visit (INDEPENDENT_AMBULATORY_CARE_PROVIDER_SITE_OTHER): Payer: BC Managed Care – PPO | Admitting: Family Medicine

## 2022-08-28 ENCOUNTER — Telehealth: Payer: Self-pay

## 2022-08-28 ENCOUNTER — Encounter: Payer: Self-pay | Admitting: Family Medicine

## 2022-08-28 VITALS — BP 144/86 | HR 85 | Temp 97.8°F | Ht 67.0 in | Wt 222.0 lb

## 2022-08-28 DIAGNOSIS — I10 Essential (primary) hypertension: Secondary | ICD-10-CM | POA: Diagnosis not present

## 2022-08-28 DIAGNOSIS — Z992 Dependence on renal dialysis: Secondary | ICD-10-CM

## 2022-08-28 DIAGNOSIS — E8779 Other fluid overload: Secondary | ICD-10-CM | POA: Diagnosis not present

## 2022-08-28 DIAGNOSIS — E039 Hypothyroidism, unspecified: Secondary | ICD-10-CM

## 2022-08-28 DIAGNOSIS — R10A2 Flank pain, left side: Secondary | ICD-10-CM

## 2022-08-28 DIAGNOSIS — Z09 Encounter for follow-up examination after completed treatment for conditions other than malignant neoplasm: Secondary | ICD-10-CM

## 2022-08-28 DIAGNOSIS — N83201 Unspecified ovarian cyst, right side: Secondary | ICD-10-CM

## 2022-08-28 DIAGNOSIS — D638 Anemia in other chronic diseases classified elsewhere: Secondary | ICD-10-CM

## 2022-08-28 DIAGNOSIS — N186 End stage renal disease: Secondary | ICD-10-CM

## 2022-08-28 DIAGNOSIS — R109 Unspecified abdominal pain: Secondary | ICD-10-CM

## 2022-08-28 DIAGNOSIS — N83202 Unspecified ovarian cyst, left side: Secondary | ICD-10-CM

## 2022-08-28 LAB — CBC WITH DIFFERENTIAL/PLATELET
Basophils Absolute: 0.1 10*3/uL (ref 0.0–0.1)
Basophils Relative: 0.6 % (ref 0.0–3.0)
Eosinophils Absolute: 0.5 10*3/uL (ref 0.0–0.7)
Eosinophils Relative: 5 % (ref 0.0–5.0)
HCT: 23.7 % — CL (ref 36.0–46.0)
Hemoglobin: 8.1 g/dL — ABNORMAL LOW (ref 12.0–15.0)
Lymphocytes Relative: 9.3 % — ABNORMAL LOW (ref 12.0–46.0)
Lymphs Abs: 0.9 10*3/uL (ref 0.7–4.0)
MCHC: 34.3 g/dL (ref 30.0–36.0)
MCV: 89.4 fl (ref 78.0–100.0)
Monocytes Absolute: 0.7 10*3/uL (ref 0.1–1.0)
Monocytes Relative: 7.5 % (ref 3.0–12.0)
Neutro Abs: 7.7 10*3/uL (ref 1.4–7.7)
Neutrophils Relative %: 77.6 % — ABNORMAL HIGH (ref 43.0–77.0)
Platelets: 283 10*3/uL (ref 150.0–400.0)
RBC: 2.65 Mil/uL — ABNORMAL LOW (ref 3.87–5.11)
RDW: 17.7 % — ABNORMAL HIGH (ref 11.5–15.5)
WBC: 10 10*3/uL (ref 4.0–10.5)

## 2022-08-28 LAB — BASIC METABOLIC PANEL
BUN: 39 mg/dL — ABNORMAL HIGH (ref 6–23)
CO2: 27 mEq/L (ref 19–32)
Calcium: 9.9 mg/dL (ref 8.4–10.5)
Chloride: 95 mEq/L — ABNORMAL LOW (ref 96–112)
Creatinine, Ser: 10.14 mg/dL (ref 0.40–1.20)
GFR: 4.56 mL/min — CL (ref >60.00)
Glucose, Bld: 78 mg/dL (ref 70–99)
Potassium: 4.5 mEq/L (ref 3.5–5.1)
Sodium: 135 mEq/L (ref 135–145)

## 2022-08-28 LAB — MAGNESIUM: Magnesium: 1.9 mg/dL (ref 1.5–2.5)

## 2022-08-28 LAB — TSH: TSH: 11.79 u[IU]/mL — ABNORMAL HIGH (ref 0.35–5.50)

## 2022-08-28 MED ORDER — OXYCODONE-ACETAMINOPHEN 5-325 MG PO TABS: 1.0000 | ORAL_TABLET | Freq: Four times a day (QID) | ORAL | 0 refills | Status: AC | PRN

## 2022-08-28 NOTE — Patient Instructions (Signed)
Please go downstairs for labs before you leave.  Use caution if you take the oxycodone for pain.  This medication can make you feel sedated.  Do not drive while taking it.  We will be in touch with your results.

## 2022-08-28 NOTE — Progress Notes (Signed)
Subjective:     Patient ID: Holly [name], female    DOB: 1986-04-18, 37 y.o.   MRN: [id number]  Chief Complaint  Patient presents with   Follow-up    Feeling a little better, was able to get 2 units of blood but still feeling some weakness/tiredness.   Left flank pain every time she takes a deep breath is a shooting pain, happened when she was changing pack of dialysis machine on New Years Eve.     HPI Patient is in today for hospital discharge follow up.  Hospitalized from 08/24/2022 - 08/26/2022  Left flank pain. CT showed left adnexal mass without torsion. She recalls tugging on her dialysis machine and may have strained her muscle.  Bilateral ovarian cysts with ?rupture  She has an appt tomorrow with her OB/GYN  ESRD on HD at home.   HTN- on amlodipine and Coreg  Symptomatic anemia- received 2 units of packed RBC. Hgb at discharge was 7.4 Gynecology consulted and she had a follow up scheduled with imaging next month.   Energy has improved.  Completed antibiotic for URI. Breathing has improved.   TSH was elevated. She reports it is chronically elevated and closer to normal now. Taking levothyroxine appropriately   Health Maintenance Due  Topic Date Due   DTaP/Tdap/Td (1 - Tdap) Never done    Past Medical History:  Diagnosis Date   Anemia    Anxiety    Arthritis    hands   Blood transfusion without reported diagnosis    Cancer ([place])    Complication of anesthesia    with Fentanyl she is difficult to awake   Depression    Dialysis patient ([place])    Dyspnea    due to anemia   Epilepsy ([place])    Hashimoto's disease    History of kidney stones    HLD (hyperlipidemia)    Hypertension    Hypothyroidism    Pneumonia    Renal disorder    Thyroid disease     Past Surgical History:  Procedure Laterality Date   ABDOMINAL HYSTERECTOMY     ABDOMINAL SURGERY     AV FISTULA PLACEMENT Left 01/17/2022   Procedure: ARTERIOVENOUS (AV) FISTULA CREATION (  RADIAL CEPHALIC);  Surgeon: Katha Cabal, MD;  Location: [facility] ORS;  Service: Vascular;  Laterality: Left;   CERVICAL BIOPSY  W/ LOOP ELECTRODE EXCISION  2018   COLONOSCOPY N/A 01/30/2022   Procedure: COLONOSCOPY;  Surgeon: Annamaria Helling, DO;  Location: [facility] ENDOSCOPY;  Service: Gastroenterology;  Laterality: N/A;   COLONOSCOPY WITH PROPOFOL N/A 02/01/2022   Procedure: COLONOSCOPY WITH PROPOFOL;  Surgeon: Lesly Rubenstein, MD;  Location: [facility] ENDOSCOPY;  Service: Endoscopy;  Laterality: N/A;   DIAGNOSTIC LAPAROSCOPY     due to removal of PD catheters   DIALYSIS/PERMA CATHETER INSERTION N/A 06/09/2022   Procedure: DIALYSIS/PERMA CATHETER INSERTION;  Surgeon: Algernon Huxley, MD;  Location: [place] [facility];  Service: Cardiovascular;  Laterality: N/A;   DIALYSIS/PERMA CATHETER INSERTION N/A 06/30/2022   Procedure: DIALYSIS/PERMA CATHETER INSERTION;  Surgeon: Marty Heck, MD;  Location: [place] [facility];  Service: Cardiovascular;  Laterality: N/A;   ESOPHAGOGASTRODUODENOSCOPY N/A 01/30/2022   Procedure: ESOPHAGOGASTRODUODENOSCOPY (EGD);  Surgeon: Annamaria Helling, DO;  Location[facility] ENDOSCOPY;  Service: Gastroenterology;  Laterality: N/A;   ESOPHAGOGASTRODUODENOSCOPY (EGD) WITH PROPOFOL N/A 02/01/2022   Procedure: ESOPHAGOGASTRODUODENOSCOPY (EGD) WITH PROPOFOL;  Surgeon: Lesly Rubenstein, MD;  Location: [facility] ENDOSCOPY;  Service: Endoscopy;  Laterality: N/A;  RENAL BIOPSY     x 2   TUMOR REMOVAL     right face   UTERINE ARTERY EMBOLIZATION  05/04/2022    Family History  Problem Relation Age of Onset   Multiple sclerosis Mother    Cancer Father    COPD Father     Social History   Socioeconomic History   Marital status: Significant Other    Spouse name: [name]   Number of children: Not on file   Years of education: Not on file   Highest education level: Not on file  Occupational History   Not on file  Tobacco Use   Smoking status: Former     Types: Cigarettes   Smokeless tobacco: Never  Vaping Use   Vaping Use: Never used  Substance and Sexual Activity   Alcohol use: Never   Drug use: Never   Sexual activity: Not on file  Other Topics Concern   Not on file  Social History Narrative   Not on file   Social Determinants of Health   Financial Resource Strain: Not on file  Food Insecurity: Not on file  Transportation Needs: Not on file  Physical Activity: Not on file  Stress: Not on file  Social Connections: Not on file  Intimate Partner Violence: Not on file    Outpatient Medications Prior to Visit  Medication Sig Dispense Refill   acyclovir (ZOVIRAX) 200 MG capsule Take 200 mg by mouth 2 (two) times daily as needed (out breaks).     amLODipine (NORVASC) 10 MG tablet Take 1 tablet (10 mg total) by mouth daily. 30 tablet 0   carvedilol (COREG) 12.5 MG tablet Take 12.5 mg by mouth 2 (two) times daily with a meal.     cefdinir (OMNICEF) 125 MG/5ML suspension Take 5 mLs (125 mg total) by mouth 2 (two) times daily. 100 mL 0   cetirizine (ZYRTEC) 10 MG tablet Take 10 mg by mouth at bedtime.     cinacalcet (SENSIPAR) 60 MG tablet Take 60 mg by mouth at bedtime.     epoetin alfa (EPOGEN) 10000 UNIT/ML injection Inject 10,000 Units into the skin once a week. saturdays     epoetin alfa (EPOGEN) 20000 UNIT/ML injection Inject 20,000 Units into the skin once a week. Saturdays     gabapentin (NEURONTIN) 100 MG capsule Take 100 mg by mouth 3 (three) times daily.     levothyroxine (SYNTHROID) 200 MCG tablet Take 200 mcg by mouth daily before breakfast.     ondansetron (ZOFRAN-ODT) 4 MG disintegrating tablet Take 4 mg by mouth every 8 (eight) hours as needed for nausea or vomiting.     VELPHORO 500 MG chewable tablet Chew 500 mg by mouth 3 (three) times daily with meals.     acetaminophen (TYLENOL) 500 MG tablet Take 1,000 mg by mouth every 6 (six) hours as needed for headache (pain).     albuterol (VENTOLIN HFA) 108 (90 Base) MCG/ACT  inhaler Inhale 2-4 puffs into the lungs every 4 (four) hours as needed for wheezing (or cough). (Patient not taking: Reported on 08/28/2022) 18 g 0   diclofenac Sodium (VOLTAREN) 1 % GEL Apply 1 application  topically 4 (four) times daily as needed (hand,feet,thighs and back pain). (Patient not taking: Reported on 08/27/2022)     No facility-administered medications prior to visit.    Allergies  Allergen Reactions   Ciprofloxacin Anaphylaxis and Other (See Comments)    Caused Seizures   Seizures   Other reaction(s): Other (See Comments)  Seizures  Seizures     Caused Seizures  Seizures  Seizures  Other reaction(s): Other (See Comments) Seizures  Seizures  Seizures     Other reaction(s): Other (See Comments) Seizures  Seizures  Seizures     Caused Seizures  Caused Seizures  Seizures  Seizures  Other reaction(s): Other (See Comments) Seizures  Seizures  Seizures    Seizures Grand mal   Rituximab Anaphylaxis and Swelling    ROS     Objective:    Physical Exam Constitutional:      General: She is not in acute distress.    Appearance: She is not ill-appearing.  Cardiovascular:     Rate and Rhythm: Normal rate and regular rhythm.  Pulmonary:     Effort: Pulmonary effort is normal.     Breath sounds: Normal breath sounds.  Musculoskeletal:     Cervical back: Normal range of motion.       Back:     Comments: TTP to left flank. Pain is reproduced with movement and deep inspiration. No rib tenderness.   Skin:    General: Skin is warm and dry.  Neurological:     General: No focal deficit present.     Mental Status: She is alert and oriented to person, place, and time.  Psychiatric:        Mood and Affect: Mood normal.        Behavior: Behavior normal.        Thought Content: Thought content normal.     BP (!) 144/86 (BP Location: Right Arm, Patient Position: Sitting, Cuff Size: Large)   Pulse 85   Temp 97.8 F (36.6 C) (Temporal)   Ht '5\' 7"'$  (1.702 m)   Wt 222 lb  (100.7 kg)   LMP 03/31/2022 (Exact Date) Comment: pt bleeding last 6 weeks with recent multiple transfusions  SpO2 98%   BMI 34.77 kg/m  Wt Readings from Last 3 Encounters:  08/28/22 222 lb (100.7 kg)  08/26/22 216 lb 0.8 oz (98 kg)  08/21/22 221 lb (100.2 kg)       Assessment & Plan:   Problem List Items Addressed This Visit       Cardiovascular and Mediastinum   Essential hypertension (Chronic)   Relevant Orders   CBC with Differential/Platelet (Completed)   Basic metabolic panel (Completed)   Magnesium (Completed)     Endocrine   Hypothyroidism (Chronic)   Relevant Orders   TSH (Completed)   Bilateral ovarian cysts     Genitourinary   ESRD on dialysis ([place]) (Chronic)   Relevant Orders   CBC with Differential/Platelet (Completed)   Basic metabolic panel (Completed)   Magnesium (Completed)     Other   Anemia of chronic disease   Relevant Orders   CBC with Differential/Platelet (Completed)   Basic metabolic panel (Completed)   Other Visit Diagnoses     Hospital discharge follow-up    -  Primary   Relevant Orders   CBC with Differential/Platelet (Completed)   Basic metabolic panel (Completed)   TSH (Completed)   Magnesium (Completed)   Left flank pain       Relevant Medications   oxyCODONE-acetaminophen (PERCOCET/ROXICET) 5-325 MG tablet      Reviewed hospital discharge summary and results.  Check labs as recommended.  Flank pain appears to be MSK related. Discussed Tylenol, ice or heat, topical analgesic and Oxycodone prescribed.  She will see her OB/GYN in [facility] tomorrow for abnormal findings on CT and [place].  Followed closely  by nephrology and hematology.  Follow up in 3 months or sooner if needed.   I have discontinued Lemmie Evens. Dorsainvil's acetaminophen. I am also having her start on oxyCODONE-acetaminophen. Additionally, I am having her maintain her carvedilol, cinacalcet, levothyroxine, acyclovir, diclofenac Sodium, gabapentin, amLODipine, Velphoro,  ondansetron, cetirizine, albuterol, cefdinir, epoetin alfa, and epoetin alfa.  Meds ordered this encounter  Medications   oxyCODONE-acetaminophen (PERCOCET/ROXICET) 5-325 MG tablet    Sig: Take 1 tablet by mouth every 6 (six) hours as needed for up to 3 days for severe pain.    Dispense:  12 tablet    Refill:  0    Order Specific Question:   Supervising Provider    Answer:   Pricilla Holm A [[id number]]

## 2022-08-28 NOTE — Telephone Encounter (Signed)
On call: called w/critical value - elevated creatinine. Noted - the pt is on home HD. Thx

## 2022-08-29 DIAGNOSIS — E8779 Other fluid overload: Secondary | ICD-10-CM | POA: Diagnosis not present

## 2022-08-29 DIAGNOSIS — Z992 Dependence on renal dialysis: Secondary | ICD-10-CM | POA: Diagnosis not present

## 2022-08-29 DIAGNOSIS — N186 End stage renal disease: Secondary | ICD-10-CM | POA: Diagnosis not present

## 2022-08-30 DIAGNOSIS — Z992 Dependence on renal dialysis: Secondary | ICD-10-CM | POA: Diagnosis not present

## 2022-08-30 DIAGNOSIS — N186 End stage renal disease: Secondary | ICD-10-CM | POA: Diagnosis not present

## 2022-08-30 DIAGNOSIS — E8779 Other fluid overload: Secondary | ICD-10-CM | POA: Diagnosis not present

## 2022-08-31 DIAGNOSIS — N186 End stage renal disease: Secondary | ICD-10-CM | POA: Diagnosis not present

## 2022-08-31 DIAGNOSIS — E8779 Other fluid overload: Secondary | ICD-10-CM | POA: Diagnosis not present

## 2022-08-31 DIAGNOSIS — Z992 Dependence on renal dialysis: Secondary | ICD-10-CM | POA: Diagnosis not present

## 2022-09-01 DIAGNOSIS — N186 End stage renal disease: Secondary | ICD-10-CM | POA: Diagnosis not present

## 2022-09-01 DIAGNOSIS — E8779 Other fluid overload: Secondary | ICD-10-CM | POA: Diagnosis not present

## 2022-09-01 DIAGNOSIS — Z992 Dependence on renal dialysis: Secondary | ICD-10-CM | POA: Diagnosis not present

## 2022-09-02 DIAGNOSIS — N186 End stage renal disease: Secondary | ICD-10-CM | POA: Diagnosis not present

## 2022-09-02 DIAGNOSIS — Z992 Dependence on renal dialysis: Secondary | ICD-10-CM | POA: Diagnosis not present

## 2022-09-02 DIAGNOSIS — E8779 Other fluid overload: Secondary | ICD-10-CM | POA: Diagnosis not present

## 2022-09-03 ENCOUNTER — Encounter: Payer: Self-pay | Admitting: Family Medicine

## 2022-09-03 DIAGNOSIS — N186 End stage renal disease: Secondary | ICD-10-CM | POA: Diagnosis not present

## 2022-09-03 DIAGNOSIS — Z992 Dependence on renal dialysis: Secondary | ICD-10-CM | POA: Diagnosis not present

## 2022-09-03 DIAGNOSIS — E8779 Other fluid overload: Secondary | ICD-10-CM | POA: Diagnosis not present

## 2022-09-03 NOTE — Telephone Encounter (Signed)
Pt f/u on flank pain

## 2022-09-04 DIAGNOSIS — Z992 Dependence on renal dialysis: Secondary | ICD-10-CM | POA: Diagnosis not present

## 2022-09-04 DIAGNOSIS — N186 End stage renal disease: Secondary | ICD-10-CM | POA: Diagnosis not present

## 2022-09-04 DIAGNOSIS — E8779 Other fluid overload: Secondary | ICD-10-CM | POA: Diagnosis not present

## 2022-09-05 ENCOUNTER — Encounter: Payer: Self-pay | Admitting: Physician Assistant

## 2022-09-05 ENCOUNTER — Telehealth (INDEPENDENT_AMBULATORY_CARE_PROVIDER_SITE_OTHER): Payer: Self-pay

## 2022-09-05 NOTE — Telephone Encounter (Signed)
Spoke with the patient and she is scheduled with Dr. Delana Meyer for a LUE fistulagram on 09/09/22 with a 12:00 pm [facility]. Pre-procedure instructions were discussed and patient stated she understood.

## 2022-09-06 DIAGNOSIS — Z992 Dependence on renal dialysis: Secondary | ICD-10-CM | POA: Diagnosis not present

## 2022-09-06 DIAGNOSIS — E8779 Other fluid overload: Secondary | ICD-10-CM | POA: Diagnosis not present

## 2022-09-06 DIAGNOSIS — N186 End stage renal disease: Secondary | ICD-10-CM | POA: Diagnosis not present

## 2022-09-07 DIAGNOSIS — E8779 Other fluid overload: Secondary | ICD-10-CM | POA: Diagnosis not present

## 2022-09-07 DIAGNOSIS — Z992 Dependence on renal dialysis: Secondary | ICD-10-CM | POA: Diagnosis not present

## 2022-09-07 DIAGNOSIS — N186 End stage renal disease: Secondary | ICD-10-CM | POA: Diagnosis not present

## 2022-09-08 DIAGNOSIS — E8779 Other fluid overload: Secondary | ICD-10-CM | POA: Diagnosis not present

## 2022-09-08 DIAGNOSIS — N186 End stage renal disease: Secondary | ICD-10-CM | POA: Diagnosis not present

## 2022-09-08 DIAGNOSIS — Z992 Dependence on renal dialysis: Secondary | ICD-10-CM | POA: Diagnosis not present

## 2022-09-09 ENCOUNTER — Other Ambulatory Visit: Payer: Self-pay

## 2022-09-09 ENCOUNTER — Encounter: Payer: Self-pay | Admitting: Vascular Surgery

## 2022-09-09 ENCOUNTER — Ambulatory Visit
Admission: RE | Admit: 2022-09-09 | Discharge: 2022-09-09 | Disposition: A | Discharge status: Home / Self Care | Payer: Medicare Other | Attending: Vascular Surgery | Admitting: Vascular Surgery

## 2022-09-09 ENCOUNTER — Encounter
Admission: RE | Disposition: A | Discharge status: Home / Self Care | Payer: Self-pay | Source: Home / Self Care | Attending: Vascular Surgery

## 2022-09-09 DIAGNOSIS — T82858A Stenosis of vascular prosthetic devices, implants and grafts, initial encounter: Secondary | ICD-10-CM | POA: Diagnosis not present

## 2022-09-09 DIAGNOSIS — Z992 Dependence on renal dialysis: Secondary | ICD-10-CM | POA: Insufficient documentation

## 2022-09-09 DIAGNOSIS — E8779 Other fluid overload: Secondary | ICD-10-CM | POA: Diagnosis not present

## 2022-09-09 DIAGNOSIS — N186 End stage renal disease: Secondary | ICD-10-CM | POA: Diagnosis not present

## 2022-09-09 DIAGNOSIS — I120 Hypertensive chronic kidney disease with stage 5 chronic kidney disease or end stage renal disease: Secondary | ICD-10-CM | POA: Insufficient documentation

## 2022-09-09 DIAGNOSIS — Y832 Surgical operation with anastomosis, bypass or graft as the cause of abnormal reaction of the patient, or of later complication, without mention of misadventure at the time of the procedure: Secondary | ICD-10-CM | POA: Insufficient documentation

## 2022-09-09 HISTORY — DX: End stage renal disease: N18.6

## 2022-09-09 HISTORY — PX: A/V FISTULAGRAM: CATH118298

## 2022-09-09 HISTORY — DX: Unspecified nephritic syndrome with dense deposit disease: N05.6

## 2022-09-09 LAB — POTASSIUM (ARMC VASCULAR LAB ONLY): Potassium (ARMC vascular lab): 3.9 mmol/L (ref 3.5–5.1)

## 2022-09-09 SURGERY — A/V FISTULAGRAM
Anesthesia: Moderate Sedation | Laterality: Left

## 2022-09-09 MED ORDER — ONDANSETRON HCL 4 MG/2ML IJ SOLN: 4.0000 mg | Freq: Four times a day (QID) | INTRAMUSCULAR | Status: DC | PRN

## 2022-09-09 MED ORDER — CEFAZOLIN SODIUM-DEXTROSE 1-4 GM/50ML-% IV SOLN
INTRAVENOUS | Status: AC
  Administered 2022-09-09: 1 g via INTRAVENOUS
  Filled 2022-09-09: qty 50

## 2022-09-09 MED ORDER — FENTANYL CITRATE (PF) 100 MCG/2ML IJ SOLN
INTRAMUSCULAR | Status: DC | PRN
  Administered 2022-09-09 (×5): 25 ug via INTRAVENOUS
  Administered 2022-09-09: 50 ug via INTRAVENOUS
  Administered 2022-09-09 (×2): 25 ug via INTRAVENOUS

## 2022-09-09 MED ORDER — MIDAZOLAM HCL 5 MG/5ML IJ SOLN
INTRAMUSCULAR | Status: AC
  Filled 2022-09-09: qty 5

## 2022-09-09 MED ORDER — FAMOTIDINE 20 MG PO TABS: 40.0000 mg | ORAL_TABLET | Freq: Once | ORAL | Status: DC | PRN

## 2022-09-09 MED ORDER — CEFAZOLIN SODIUM-DEXTROSE 1-4 GM/50ML-% IV SOLN: 1.0000 g | INTRAVENOUS | Status: AC

## 2022-09-09 MED ORDER — MIDAZOLAM HCL 2 MG/2ML IJ SOLN
INTRAMUSCULAR | Status: DC | PRN
  Administered 2022-09-09 (×6): 1 mg via INTRAVENOUS

## 2022-09-09 MED ORDER — FENTANYL CITRATE (PF) 100 MCG/2ML IJ SOLN
INTRAMUSCULAR | Status: AC
  Filled 2022-09-09: qty 2

## 2022-09-09 MED ORDER — IODIXANOL 320 MG/ML IV SOLN
INTRAVENOUS | Status: DC | PRN
  Administered 2022-09-09: 25 mL

## 2022-09-09 MED ORDER — METHYLPREDNISOLONE SODIUM SUCC 125 MG IJ SOLR: 125.0000 mg | Freq: Once | INTRAMUSCULAR | Status: DC | PRN

## 2022-09-09 MED ORDER — NITROGLYCERIN 1 MG/10 ML FOR IR/CATH LAB
INTRA_ARTERIAL | Status: DC | PRN
  Administered 2022-09-09: 400 ug via INTRA_ARTERIAL

## 2022-09-09 MED ORDER — DIPHENHYDRAMINE HCL 50 MG/ML IJ SOLN: 50.0000 mg | Freq: Once | INTRAMUSCULAR | Status: DC | PRN

## 2022-09-09 MED ORDER — MIDAZOLAM HCL 2 MG/2ML IJ SOLN
INTRAMUSCULAR | Status: AC
  Filled 2022-09-09: qty 2

## 2022-09-09 MED ORDER — MIDAZOLAM HCL 2 MG/ML PO SYRP: 8.0000 mg | ORAL_SOLUTION | Freq: Once | ORAL | Status: DC | PRN

## 2022-09-09 MED ORDER — HEPARIN SODIUM (PORCINE) 1000 UNIT/ML IJ SOLN
INTRAMUSCULAR | Status: AC
  Filled 2022-09-09: qty 10

## 2022-09-09 MED ORDER — HYDROMORPHONE HCL 1 MG/ML IJ SOLN: 1.0000 mg | Freq: Once | INTRAMUSCULAR | Status: DC | PRN

## 2022-09-09 MED ORDER — HEPARIN SODIUM (PORCINE) 1000 UNIT/ML IJ SOLN
INTRAMUSCULAR | Status: DC | PRN
  Administered 2022-09-09: 4000 [IU] via INTRAVENOUS

## 2022-09-09 MED ORDER — SODIUM CHLORIDE 0.9 % IV SOLN: INTRAVENOUS | Status: DC

## 2022-09-09 MED ORDER — NITROGLYCERIN 1 MG/10 ML FOR IR/CATH LAB
INTRA_ARTERIAL | Status: AC
  Filled 2022-09-09: qty 10

## 2022-09-09 SURGICAL SUPPLY — 22 items
BALLN LUTONIX  018 4X60X130 (BALLOONS) ×1
BALLN LUTONIX 018 4X40X130 (BALLOONS) ×1
BALLN LUTONIX 018 4X60X130 (BALLOONS) ×1
BALLN ULTRV 018 3X40X75 (BALLOONS) ×1
BALLOON LUTONIX 018 4X40X130 (BALLOONS) IMPLANT
BALLOON LUTONIX 018 4X60X130 (BALLOONS) IMPLANT
BALLOON ULTRV 018 3X40X75 (BALLOONS) IMPLANT
COVER PROBE U/S 5X48 (MISCELLANEOUS) IMPLANT
DRAPE BRACHIAL (DRAPES) IMPLANT
GLIDECATH 4FR STR (CATHETERS) IMPLANT
GOWN STRL REUS W/ TWL LRG LVL3 (GOWN DISPOSABLE) ×1 IMPLANT
GOWN STRL REUS W/TWL LRG LVL3 (GOWN DISPOSABLE) ×1
GUIDEWIRE ANGLED .035 180CM (WIRE) IMPLANT
KIT ENCORE 26 ADVANTAGE (KITS) IMPLANT
NDL ENTRY 21GA 7CM ECHOTIP (NEEDLE) IMPLANT
NEEDLE ENTRY 21GA 7CM ECHOTIP (NEEDLE) ×1 IMPLANT
PACK ANGIOGRAPHY (CUSTOM PROCEDURE TRAY) ×1 IMPLANT
SET INTRO CAPELLA COAXIAL (SET/KITS/TRAYS/PACK) IMPLANT
SHEATH BRITE TIP 4FRX11 (SHEATH) IMPLANT
SHEATH BRITE TIP 6FRX5.5 (SHEATH) IMPLANT
SUT MNCRL AB 4-0 PS2 18 (SUTURE) IMPLANT
WIRE G 018X200 V18 (WIRE) IMPLANT

## 2022-09-09 NOTE — Interval H&P Note (Signed)
History and Physical Interval Note:  09/09/2022 12:21 PM  Holly [name]  has presented today for surgery, with the diagnosis of LUE Fistulagram   End Stage Renal.  The various methods of treatment have been discussed with the patient and family. After consideration of risks, benefits and other options for treatment, the patient has consented to  Procedure(s): A/V Fistulagram (Left) as a surgical intervention.  The patient's history has been reviewed, patient examined, no change in status, stable for surgery.  I have reviewed the patient's chart and labs.  Questions were answered to the patient's satisfaction.     [name]

## 2022-09-09 NOTE — Progress Notes (Signed)
MRN : [id number]  Holly [name] is a 37 y.o. (07/23/1986) female who presents with chief complaint of check access.  History of Present Illness:   Patient presents to [facility] for treatment of her dialysis access which has been having increasing problems.  She was recently seen in the office for follow up regarding a problem with their dialysis access.  The patient has a left radiocephalic AV fistula with has not matured well and she is currently maintained via PermCath.  She does dialysis at home.   The patient denies redness or swelling at the access site. The patient denies fever or chills at home or while on dialysis.   No recent shortening of the patient's walking distance or new symptoms consistent with claudication.  No history of rest pain symptoms. No new ulcers or wounds of the lower extremities have occurred.   The patient denies amaurosis fugax or recent TIA symptoms. There are no recent neurological changes noted. There is no history of DVT, PE or superficial thrombophlebitis. No recent episodes of angina or shortness of breath documented.    Duplex ultrasound of the AV access shows a patent access.  The previously noted stenosis is significantly increased compared to last study.  Flow volume today is 259 cc/min (previous flow volume was 558 cc/min)  No outpatient medications have been marked as taking for the 09/09/22 encounter [facility]).    Past Medical History:  Diagnosis Date   Anemia    Anxiety    Arthritis    hands   Blood transfusion without reported diagnosis    Cancer ([place])    Complication of anesthesia    with Fentanyl she is difficult to awake   Depression    Dialysis patient ([place])    Dyspnea    due to anemia   Epilepsy ([place])    Hashimoto's disease    History of kidney stones    HLD (hyperlipidemia)    Hypertension    Hypothyroidism    Pneumonia    Renal disorder    Thyroid  disease     Past Surgical History:  Procedure Laterality Date   ABDOMINAL HYSTERECTOMY     ABDOMINAL SURGERY     AV FISTULA PLACEMENT Left 01/17/2022   Procedure: ARTERIOVENOUS (AV) FISTULA CREATION ( RADIAL CEPHALIC);  Surgeon: Katha Cabal, MD;  Location: [facility] ORS;  Service: Vascular;  Laterality: Left;   CERVICAL BIOPSY  W/ LOOP ELECTRODE EXCISION  2018   COLONOSCOPY N/A 01/30/2022   Procedure: COLONOSCOPY;  Surgeon: Annamaria Helling, DO;  Location: [facility] ENDOSCOPY;  Service: Gastroenterology;  Laterality: N/A;   COLONOSCOPY WITH PROPOFOL N/A 02/01/2022   Procedure: COLONOSCOPY WITH PROPOFOL;  Surgeon: Lesly Rubenstein, MD;  Location: [facility] ENDOSCOPY;  Service: Endoscopy;  Laterality: N/A;   DIAGNOSTIC LAPAROSCOPY     due to removal of PD catheters   DIALYSIS/PERMA CATHETER INSERTION N/A 06/09/2022   Procedure: DIALYSIS/PERMA CATHETER INSERTION;  Surgeon: Algernon Huxley, MD;  Location: [place] CV LAB;  Service: Cardiovascular;  Laterality: N/A;   DIALYSIS/PERMA CATHETER INSERTION N/A 06/30/2022   Procedure: DIALYSIS/PERMA CATHETER INSERTION;  Surgeon: Marty Heck, MD;  Location: [place] [facility];  Service: Cardiovascular;  Laterality: N/A;   ESOPHAGOGASTRODUODENOSCOPY N/A 01/30/2022   Procedure: ESOPHAGOGASTRODUODENOSCOPY (EGD);  Surgeon: Annamaria Helling, DO;  Location: [facility] ENDOSCOPY;  Service: Gastroenterology;  Laterality: N/A;   ESOPHAGOGASTRODUODENOSCOPY (EGD) WITH PROPOFOL N/A 02/01/2022   Procedure: ESOPHAGOGASTRODUODENOSCOPY (EGD) WITH PROPOFOL;  Surgeon: Lesly Rubenstein, MD;  Location: [facility] ENDOSCOPY;  Service: Endoscopy;  Laterality: N/A;   RENAL BIOPSY     x 2   TUMOR REMOVAL     right face   UTERINE ARTERY EMBOLIZATION  05/04/2022    Social History Social History   Tobacco Use   Smoking status: Former    Types: Cigarettes   Smokeless tobacco: Never  Vaping Use   Vaping Use: Never used  Substance Use Topics   Alcohol use:  Never   Drug use: Never    Family History Family History  Problem Relation Age of Onset   Multiple sclerosis Mother    Cancer Father    COPD Father     Allergies  Allergen Reactions   Ciprofloxacin Anaphylaxis and Other (See Comments)    Caused Seizures   Seizures   Other reaction(s): Other (See Comments)  Seizures  Seizures     Caused Seizures  Seizures  Seizures  Other reaction(s): Other (See Comments) Seizures  Seizures  Seizures     Other reaction(s): Other (See Comments) Seizures  Seizures  Seizures     Caused Seizures  Caused Seizures  Seizures  Seizures  Other reaction(s): Other (See Comments) Seizures  Seizures  Seizures    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 Vascular:  '[]'$ Pain in legs with walking   '[]'$ Pain in legs at rest  '[]'$ History of DVT   '[]'$ Phlebitis   '[]'$ Swelling in legs   '[]'$ Varicose veins   '[]'$ Non-healing ulcers Pulmonary:   '[]'$ Uses home oxygen   '[]'$ Productive cough   '[]'$ Hemoptysis   '[]'$ Wheeze  '[]'$ COPD   '[]'$ Asthma Neurologic:  '[]'$ Dizziness   '[]'$ Seizures   '[]'$ History of stroke   '[]'$ History of TIA  '[]'$ Aphasia   '[]'$ Vissual changes   '[]'$ Weakness or numbness in arm   '[]'$ Weakness or numbness in leg Musculoskeletal:   '[]'$ Joint swelling   '[]'$ Joint pain   '[]'$ Low back pain Hematologic:  '[]'$ Easy bruising  '[]'$ Easy bleeding   '[]'$ Hypercoagulable state   '[]'$ Anemic Gastrointestinal:  '[]'$ Diarrhea   '[]'$ Vomiting  '[]'$ Gastroesophageal reflux/heartburn   '[]'$ Difficulty swallowing. Genitourinary:  '[x]'$ Chronic kidney disease   '[]'$ Difficult urination  '[]'$ Frequent urination   '[]'$ Blood in urine Skin:  '[]'$ Rashes   '[]'$ Ulcers  Psychological:  '[]'$ History of anxiety   '[]'$  History of major depression.  Physical Examination  There were no vitals filed for this  visit. There is no height or weight on file to calculate BMI. Gen: WD/WN, NAD Head: [place]/AT, No temporalis wasting.  Ear/Nose/Throat: Hearing grossly intact, nares w/o erythema or drainage Eyes: PER, EOMI, sclera nonicteric.  Neck: Supple, no gross masses or lesions.  No JVD.  Pulmonary:  Good air movement, no audible wheezing, no use of accessory muscles.  Cardiac: RRR, precordium non-hyperdynamic. Vascular:   AV access is markedly pulsatile with poor thrill Vessel Right Left  Radial Palpable Palpable  Brachial Palpable Palpable  Gastrointestinal: soft, non-distended. No guarding/no peritoneal signs.  Musculoskeletal: M/S 5/5 throughout.  No deformity.  Neurologic: CN 2-12 intact. Pain and light touch intact in extremities.  Symmetrical.  Speech is fluent. Motor exam as listed above. Psychiatric: Judgment intact, Mood & affect appropriate  for pt's clinical situation. Dermatologic: No rashes or ulcers noted.  No changes consistent with cellulitis.   CBC Lab Results  Component Value Date   WBC 10.0 08/28/2022   HGB 8.1 Repeated and verified X2. (L) 08/28/2022   HCT 23.7 Repeated and verified X2. (LL) 08/28/2022   MCV 89.4 08/28/2022   PLT 283.0 08/28/2022    BMET    Component Value Date/Time   NA 135 08/28/2022 1447   K 4.5 08/28/2022 1447   CL 95 (L) 08/28/2022 1447   CO2 27 08/28/2022 1447   GLUCOSE 78 08/28/2022 1447   BUN 39 (H) 08/28/2022 1447   CREATININE 10.14 (HH) 08/28/2022 1447   CREATININE 9.97 (HH) 03/31/2022 1310   CALCIUM 9.9 08/28/2022 1447   GFRNONAA 3 (L) 08/26/2022 0322   GFRNONAA 5 (L) 03/31/2022 1310   GFRAA 3 (L) 02/19/2020 2302   Estimated Creatinine Clearance: 9.5 mL/min (A) (by C-G formula based on SCr of 10.14 mg/dL [facility])).  COAG Lab Results  Component Value Date   INR 1.1 02/18/2022    Radiology US Pelvis Complete  Result Date: 08/25/2022 CLINICAL DATA:  Left flank pain. EXAM: TRANSABDOMINAL ULTRASOUND OF PELVIS DOPPLER ULTRASOUND OF  OVARIES TECHNIQUE: Transabdominal ultrasound examination of the pelvis was performed including evaluation of the uterus, ovaries, adnexal regions, and pelvic cul-de-sac. Color and duplex Doppler ultrasound was utilized to evaluate blood flow to the ovaries. COMPARISON:  02/18/2022 FINDINGS: Uterus Status post hysterectomy Endometrium Not applicable Right ovary Measurements: 3.3 x 2.6 x 2.6 cm = volume: 1.2 mL. Normal appearance/no adnexal mass. Left ovary The left ovary is suboptimally visualized by transabdominal technique. (Patient refuses transvaginal technique). The left ovary measures 5.5 x 3.9 x 5.5 cm. Previously 5.1 x 4.1 x 5.8 cm. Cyst within the left ovary measures approximately 4.8 cm. Previously this measured 3.7 cm. Pulsed Doppler evaluation demonstrates normal low-resistance arterial and venous waveforms in the right ovary. Suboptimal Doppler evaluation of the left ovary due to limitations of transabdominal technique. Other: No free fluid. IMPRESSION: 1. No acute findings. No evidence for right ovarian torsion. Suboptimal Doppler evaluation of the left ovary. 2. Left ovarian cyst measures approximately 4.8 cm. Previously this measured 3.7 cm. Recommend followup US in 3-6 months. Note: This recommendation does not apply to premenarchal patients or to those with increased risk (genetic, family history, elevated tumor markers or other high-risk factors) of ovarian cancer. Reference: Radiology 2019 Nov; 293(2):359-371. Electronically Signed   By: Kerby Moors M.D.   On: 08/25/2022 09:01   [place] Art/Ven Flow Abd Pelv Doppler  Result Date: 08/25/2022 CLINICAL DATA:  Left flank pain. EXAM: TRANSABDOMINAL ULTRASOUND OF PELVIS DOPPLER ULTRASOUND OF OVARIES TECHNIQUE: Transabdominal ultrasound examination of the pelvis was performed including evaluation of the uterus, ovaries, adnexal regions, and pelvic cul-de-sac. Color and duplex Doppler ultrasound was utilized to evaluate blood flow to the ovaries.  COMPARISON:  02/18/2022 FINDINGS: Uterus Status post hysterectomy Endometrium Not applicable Right ovary Measurements: 3.3 x 2.6 x 2.6 cm = volume: 1.2 mL. Normal appearance/no adnexal mass. Left ovary The left ovary is suboptimally visualized by transabdominal technique. (Patient refuses transvaginal technique). The left ovary measures 5.5 x 3.9 x 5.5 cm. Previously 5.1 x 4.1 x 5.8 cm. Cyst within the left ovary measures approximately 4.8 cm. Previously this measured 3.7 cm. Pulsed Doppler evaluation demonstrates normal low-resistance arterial and venous waveforms in the right ovary. Suboptimal Doppler evaluation of the left ovary due to limitations of transabdominal technique. Other: No free fluid. IMPRESSION: 1. No  acute findings. No evidence for right ovarian torsion. Suboptimal Doppler evaluation of the left ovary. 2. Left ovarian cyst measures approximately 4.8 cm. Previously this measured 3.7 cm. Recommend followup US in 3-6 months. Note: This recommendation does not apply to premenarchal patients or to those with increased risk (genetic, family history, elevated tumor markers or other high-risk factors) of ovarian cancer. Reference: Radiology 2019 Nov; 293(2):359-371. Electronically Signed   By: Kerby Moors M.D.   On: 08/25/2022 09:01   CT Renal Stone Study  Result Date: 08/25/2022 CLINICAL DATA:  Left flank pain. EXAM: CT ABDOMEN AND PELVIS WITHOUT CONTRAST TECHNIQUE: Multidetector CT imaging of the abdomen and pelvis was performed following the standard protocol without IV contrast. RADIATION DOSE REDUCTION: This exam was performed according to the departmental dose-optimization program which includes automated exposure control, adjustment of the mA and/or kV according to patient size and/or use of iterative reconstruction technique. COMPARISON:  June 27, 2021 FINDINGS: Lower chest: There is a small, predominantly anterior pericardial effusion. This measures 6 mm in thickness and represents a new  finding when compared to the prior study. Hepatobiliary: A 15 mm diameter cyst is seen within the left lobe of the liver. No gallstones, gallbladder wall thickening, or biliary dilatation. Pancreas: Unremarkable. No pancreatic ductal dilatation or surrounding inflammatory changes. Spleen: Normal in size without focal abnormality. Adrenals/Urinary Tract: Adrenal glands are unremarkable. Kidneys are atrophic in size, without focal lesions. Multiple bilateral subcentimeter nonobstructing renal calculi are noted. The urinary bladder is poorly distended and subsequently limited in evaluation. Mild urinary bladder wall thickening is seen with a mild amount of surrounding inflammatory fat stranding. Stomach/Bowel: Stomach is within normal limits. Appendix appears normal. No evidence of bowel wall thickening, distention, or inflammatory changes. Vascular/Lymphatic: Aortic atherosclerosis. Subcentimeter para-aortic and aortocaval lymph nodes are noted. Reproductive: Status post hysterectomy. A 5.8 cm x 4.3 cm x 6.8 cm complex left adnexal mass is seen (axial CT images 61 through 74, CT series 3). A 17 mm diameter simple cyst is seen along the medial aspect of the right adnexa (axial CT image 71, CT series 3). Other: No abdominal wall hernia or abnormality. No abdominopelvic ascites. Musculoskeletal: No acute or significant osseous findings. IMPRESSION: 1. Findings which may represent mild cystitis. Correlation with urinalysis is recommended. 2. Multiple bilateral subcentimeter nonobstructing renal calculi. 3. Complex left adnexal mass, as described above. Further evaluation with nonemergent pelvic ultrasound is recommended. 4. Small, predominantly anterior pericardial effusion. 5. Aortic atherosclerosis. Aortic Atherosclerosis (ICD10-I70.0). Electronically Signed   By: Virgina Norfolk M.D.   On: 08/25/2022 02:37   DG Chest 2 View  Result Date: 08/21/2022 CLINICAL DATA:  Productive cough with wheezing and shortness of  breath. Congestion x1 week. EXAM: CHEST - 2 VIEW COMPARISON:  Chest radiograph June 30, 2022 FINDINGS: Sided dialysis catheter with tip at the superior cavoatrial junction. Similar cardiac enlargement and mild central vascular prominence. No overt pulmonary edema or focal airspace consolidation. No pleural effusion or pneumothorax. The visualized skeletal structures are unremarkable. IMPRESSION: Cardiomegaly and mild central vascular prominence without overt pulmonary edema or focal airspace consolidation. Electronically Signed   By: Dahlia Bailiff M.D.   On: 08/21/2022 11:36     Assessment/Plan 1. ESRD (end stage renal disease) ([place]) Recommend:   The patient is experiencing increasing problems with their dialysis access.   Patient should have a fistulagram with the intention for intervention.  The intention for intervention is to restore appropriate flow and prevent thrombosis and possible loss of the access.  As well as improve the quality of dialysis therapy.   The risks, benefits and alternative therapies were reviewed in detail with the patient.  All questions were answered.  The patient agrees to proceed with angio/intervention.     The patient will follow up with me in the office after the procedure.     2. Essential hypertension Continue antihypertensive medications as already ordered, these medications have been reviewed and there are no changes at this time.   [name], MD  09/09/2022 8:28 AM

## 2022-09-09 NOTE — Op Note (Signed)
OPERATIVE NOTE   PROCEDURE: Contrast injection left radiocephalic fistula AV access Percutaneous transluminal angioplasty left radiocephalic fistula at the arterial anastomosis and in the cephalic vein itself (both sides peripheral)  PRE-OPERATIVE DIAGNOSIS: Complication of dialysis access                                                       End Stage Renal Disease  POST-OPERATIVE DIAGNOSIS: same as above   SURGEON: Katha Cabal, M.D.  ANESTHESIA: Conscious sedation was administered under my direct supervision by the interventional radiology RN. IV Versed plus fentanyl were utilized. Continuous ECG, pulse oximetry and blood pressure was monitored throughout the entire procedure.  Conscious sedation was for a total of 51 minutes.  ESTIMATED BLOOD LOSS: minimal  FINDING(S): Stricture of the AV graft  SPECIMEN(S):  None  CONTRAST: 25 cc  FLUOROSCOPY TIME: 51 minutes  INDICATIONS: Holly [name] is a 37 y.o. female who  presents with malfunctioning left wrist AV access.  The patient is scheduled for angiography with possible intervention of the AV access.  The patient is aware the risks include but are not limited to: bleeding, infection, thrombosis of the cannulated access, and possible anaphylactic reaction to the contrast.  The patient acknowledges if the access can not be salvaged a tunneled catheter will be needed and will be placed during this procedure.  The patient is aware of the risks of the procedure and elects to proceed with the angiogram and intervention.  DESCRIPTION: After full informed written consent was obtained, the patient was brought back to the Special Procedure suite and placed supine position.  Appropriate cardiopulmonary monitors were placed.  The left arm was prepped and draped in the standard fashion.  Appropriate timeout is called. The left cephalic vein was cannulated with a micropuncture needle and the retrograde direction at the level of the  proximal forearm.  Cannulation was performed with ultrasound guidance. Ultrasound was placed in a sterile sleeve, the AV access was interrogated and noted to be echolucent and compressible indicating patency. Image was recorded for the permanent record. The puncture is performed under continuous ultrasound visualization.   The microwire was advanced and the needle was exchanged for  a microsheath.  The J-wire was then advanced and a 4 Fr sheath inserted.  Hand injections were completed to image the access from the arterial anastomosis through the entire access.    Images demonstrate the actual anastomosis is narrowed.  Otherwise the radial artery is widely patent proximal to the AV fistula and distally.  The cephalic vein itself approximately 2 cm above the anastomosis has a greater than 90% stenosis.  Later in the case more proximal to this another 80% stenosis is noted.  Visualized segments of the venous outflow otherwise are widely patent.  Based on the images, 4000 units of heparin was given and a wire was negotiated through the strictures within the venous portion of the fistula into the mid left radial artery.  Initially, an 3 mm x 40 mm balloon was used to treat the actual anastomosis.  Inflation was to 10 atm for 1 minute.  Next, a 4 mm x 40 mm Lutonix drug-eluting balloon was used to treat the cephalic vein proper just above the level of the anastomosis.  Follow-up imaging demonstrated significant spasm in both the radial artery as well  as the cephalic vein.  400 mics of nitroglycerin were then given.  The stricture within the cephalic vein did not resolve and therefore a 4 mm x 60 mm Lutonix drug-eluting balloon was used to treat the cephalic vein more proximal to the original angioplasty site.  Follow-up imaging demonstrated less than 10% residual stenosis throughout the entire fistula.  A 4-0 Monocryl purse-string suture was sewn around the sheath.  The sheath was removed and light pressure was  applied.  A sterile bandage was applied to the puncture site.    COMPLICATIONS: None  CONDITION: Holly [name], M.D [facility] Office: [phone]  09/09/2022 2:02 PM

## 2022-09-09 NOTE — H&P (View-Only) (Signed)
MRN : [id number]  Lenola Lockner is a 37 y.o. (08/07/1986) female who presents with chief complaint of check access.  History of Present Illness:   Patient presents to [facility] for treatment of her dialysis access which has been having increasing problems.  She was recently seen in the office for follow up regarding a problem with their dialysis access.  The patient has a left radiocephalic AV fistula with has not matured well and she is currently maintained via PermCath.  She does dialysis at home.   The patient denies redness or swelling at the access site. The patient denies fever or chills at home or while on dialysis.   No recent shortening of the patient's walking distance or new symptoms consistent with claudication.  No history of rest pain symptoms. No new ulcers or wounds of the lower extremities have occurred.   The patient denies amaurosis fugax or recent TIA symptoms. There are no recent neurological changes noted. There is no history of DVT, PE or superficial thrombophlebitis. No recent episodes of angina or shortness of breath documented.    Duplex ultrasound of the AV access shows a patent access.  The previously noted stenosis is significantly increased compared to last study.  Flow volume today is 259 cc/min (previous flow volume was 558 cc/min)  No outpatient medications have been marked as taking for the 09/09/22 encounter [facility]).    Past Medical History:  Diagnosis Date   Anemia    Anxiety    Arthritis    hands   Blood transfusion without reported diagnosis    Cancer ([place])    Complication of anesthesia    with Fentanyl she is difficult to awake   Depression    Dialysis patient ([place])    Dyspnea    due to anemia   Epilepsy ([place])    Hashimoto's disease    History of kidney stones    HLD (hyperlipidemia)    Hypertension    Hypothyroidism    Pneumonia    Renal disorder    Thyroid  disease     Past Surgical History:  Procedure Laterality Date   ABDOMINAL HYSTERECTOMY     ABDOMINAL SURGERY     AV FISTULA PLACEMENT Left 01/17/2022   Procedure: ARTERIOVENOUS (AV) FISTULA CREATION ( RADIAL CEPHALIC);  Surgeon: Katha Cabal, MD;  Location: [facility] ORS;  Service: Vascular;  Laterality: Left;   CERVICAL BIOPSY  W/ LOOP ELECTRODE EXCISION  2018   COLONOSCOPY N/A 01/30/2022   Procedure: COLONOSCOPY;  Surgeon: Annamaria Helling, DO;  Location: [facility]COPY;  Service: Gastroenterology;  Laterality: N/A;   COLONOSCOPY WITH PROPOFOL N/A 02/01/2022   Procedure: COLONOSCOPY WITH PROPOFOL;  Surgeon: Lesly Rubenstein, MD;  Location: [facility] ENDOSCOPY;  Service: Endoscopy;  Laterality: N/A;   DIAGNOSTIC LAPAROSCOPY     due to removal of PD catheters   DIALYSIS/PERMA CATHETER INSERTION N/A 06/09/2022   Procedure: DIALYSIS/PERMA CATHETER INSERTION;  Surgeon: Algernon Huxley, MD;  Location: [place] CV LAB;  Service: Cardiovascular;  Laterality: N/A;   DIALYSIS/PERMA CATHETER INSERTION N/A 06/30/2022   Procedure: DIALYSIS/PERMA CATHETER INSERTION;  Surgeon: Marty Heck, MD;  Location: [place] CV LAB;  Service: Cardiovascular;  Laterality: N/A;   ESOPHAGOGASTRODUODENOSCOPY N/A 01/30/2022   Procedure: ESOPHAGOGASTRODUODENOSCOPY (EGD);  Surgeon: Annamaria Helling, DO;  Location: [facility] ENDOSCOPY;  Service: Gastroenterology;  Laterality: N/A;   ESOPHAGOGASTRODUODENOSCOPY (EGD) WITH PROPOFOL N/A 02/01/2022   Procedure: ESOPHAGOGASTRODUODENOSCOPY (EGD) WITH PROPOFOL;  Surgeon: Lesly Rubenstein, MD;  Location: [facility] ENDOSCOPY;  Service: Endoscopy;  Laterality: N/A;   RENAL BIOPSY     x 2   TUMOR REMOVAL     right face   UTERINE ARTERY EMBOLIZATION  05/04/2022    Social History Social History   Tobacco Use   Smoking status: Former    Types: Cigarettes   Smokeless tobacco: Never  Vaping Use   Vaping Use: Never used  Substance Use Topics   Alcohol use:  Never   Drug use: Never    Family History Family History  Problem Relation Age of Onset   Multiple sclerosis Mother    Cancer Father    COPD Father     Allergies  Allergen Reactions   Ciprofloxacin Anaphylaxis and Other (See Comments)    Caused Seizures   Seizures   Other reaction(s): Other (See Comments)  Seizures  Seizures     Caused Seizures  Seizures  Seizures  Other reaction(s): Other (See Comments) Seizures  Seizures  Seizures     Other reaction(s): Other (See Comments) Seizures  Seizures  Seizures     Caused Seizures  Caused Seizures  Seizures  Seizures  Other reaction(s): Other (See Comments) Seizures  Seizures  Seizures    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 Vascular:  '[]'$ Pain in legs with walking   '[]'$ Pain in legs at rest  '[]'$ History of DVT   '[]'$ Phlebitis   '[]'$ Swelling in legs   '[]'$ Varicose veins   '[]'$ Non-healing ulcers Pulmonary:   '[]'$ Uses home oxygen   '[]'$ Productive cough   '[]'$ Hemoptysis   '[]'$ Wheeze  '[]'$ COPD   '[]'$ Asthma Neurologic:  '[]'$ Dizziness   '[]'$ Seizures   '[]'$ History of stroke   '[]'$ History of TIA  '[]'$ Aphasia   '[]'$ Vissual changes   '[]'$ Weakness or numbness in arm   '[]'$ Weakness or numbness in leg Musculoskeletal:   '[]'$ Joint swelling   '[]'$ Joint pain   '[]'$ Low back pain Hematologic:  '[]'$ Easy bruising  '[]'$ Easy bleeding   '[]'$ Hypercoagulable state   '[]'$ Anemic Gastrointestinal:  '[]'$ Diarrhea   '[]'$ Vomiting  '[]'$ Gastroesophageal reflux/heartburn   '[]'$ Difficulty swallowing. Genitourinary:  '[x]'$ Chronic kidney disease   '[]'$ Difficult urination  '[]'$ Frequent urination   '[]'$ Blood in urine Skin:  '[]'$ Rashes   '[]'$ Ulcers  Psychological:  '[]'$ History of anxiety   '[]'$  History of major depression.  Physical Examination  There were no vitals filed for this  visit. There is no height or weight on file to calculate BMI. Gen: WD/WN, NAD Head: [place]/AT, No temporalis wasting.  Ear/Nose/Throat: Hearing grossly intact, nares w/o erythema or drainage Eyes: PER, EOMI, sclera nonicteric.  Neck: Supple, no gross masses or lesions.  No JVD.  Pulmonary:  Good air movement, no audible wheezing, no use of accessory muscles.  Cardiac: RRR, precordium non-hyperdynamic. Vascular:   AV access is markedly pulsatile with poor thrill Vessel Right Left  Radial Palpable Palpable  Brachial Palpable Palpable  Gastrointestinal: soft, non-distended. No guarding/no peritoneal signs.  Musculoskeletal: M/S 5/5 throughout.  No deformity.  Neurologic: CN 2-12 intact. Pain and light touch intact in extremities.  Symmetrical.  Speech is fluent. Motor exam as listed above. Psychiatric: Judgment intact, Mood & affect appropriate  for pt's clinical situation. Dermatologic: No rashes or ulcers noted.  No changes consistent with cellulitis.   CBC Lab Results  Component Value Date   WBC 10.0 08/28/2022   HGB 8.1 Repeated and verified X2. (L) 08/28/2022   HCT 23.7 Repeated and verified X2. (LL) 08/28/2022   MCV 89.4 08/28/2022   PLT 283.0 08/28/2022    BMET    Component Value Date/Time   NA 135 08/28/2022 1447   K 4.5 08/28/2022 1447   CL 95 (L) 08/28/2022 1447   CO2 27 08/28/2022 1447   GLUCOSE 78 08/28/2022 1447   BUN 39 (H) 08/28/2022 1447   CREATININE 10.14 (HH) 08/28/2022 1447   CREATININE 9.97 (HH) 03/31/2022 1310   CALCIUM 9.9 08/28/2022 1447   GFRNONAA 3 (L) 08/26/2022 0322   GFRNONAA 5 (L) 03/31/2022 1310   GFRAA 3 (L) 02/19/2020 2302   Estimated Creatinine Clearance: 9.5 mL/min (A) (by C-G formula based on SCr of 10.14 mg/dL [facility])).  COAG Lab Results  Component Value Date   INR 1.1 02/18/2022    Radiology US Pelvis Complete  Result Date: 08/25/2022 CLINICAL DATA:  Left flank pain. EXAM: TRANSABDOMINAL ULTRASOUND OF PELVIS DOPPLER ULTRASOUND OF  OVARIES TECHNIQUE: Transabdominal ultrasound examination of the pelvis was performed including evaluation of the uterus, ovaries, adnexal regions, and pelvic cul-de-sac. Color and duplex Doppler ultrasound was utilized to evaluate blood flow to the ovaries. COMPARISON:  02/18/2022 FINDINGS: Uterus Status post hysterectomy Endometrium Not applicable Right ovary Measurements: 3.3 x 2.6 x 2.6 cm = volume: 1.2 mL. Normal appearance/no adnexal mass. Left ovary The left ovary is suboptimally visualized by transabdominal technique. (Patient refuses transvaginal technique). The left ovary measures 5.5 x 3.9 x 5.5 cm. Previously 5.1 x 4.1 x 5.8 cm. Cyst within the left ovary measures approximately 4.8 cm. Previously this measured 3.7 cm. Pulsed Doppler evaluation demonstrates normal low-resistance arterial and venous waveforms in the right ovary. Suboptimal Doppler evaluation of the left ovary due to limitations of transabdominal technique. Other: No free fluid. IMPRESSION: 1. No acute findings. No evidence for right ovarian torsion. Suboptimal Doppler evaluation of the left ovary. 2. Left ovarian cyst measures approximately 4.8 cm. Previously this measured 3.7 cm. Recommend followup US in 3-6 months. Note: This recommendation does not apply to premenarchal patients or to those with increased risk (genetic, family history, elevated tumor markers or other high-risk factors) of ovarian cancer. Reference: Radiology 2019 Nov; 293(2):359-371. Electronically Signed   By: Kerby Moors M.D.   On: 08/25/2022 09:01   [place] Art/Ven Flow Abd Pelv Doppler  Result Date: 08/25/2022 CLINICAL DATA:  Left flank pain. EXAM: TRANSABDOMINAL ULTRASOUND OF PELVIS DOPPLER ULTRASOUND OF OVARIES TECHNIQUE: Transabdominal ultrasound examination of the pelvis was performed including evaluation of the uterus, ovaries, adnexal regions, and pelvic cul-de-sac. Color and duplex Doppler ultrasound was utilized to evaluate blood flow to the ovaries.  COMPARISON:  02/18/2022 FINDINGS: Uterus Status post hysterectomy Endometrium Not applicable Right ovary Measurements: 3.3 x 2.6 x 2.6 cm = volume: 1.2 mL. Normal appearance/no adnexal mass. Left ovary The left ovary is suboptimally visualized by transabdominal technique. (Patient refuses transvaginal technique). The left ovary measures 5.5 x 3.9 x 5.5 cm. Previously 5.1 x 4.1 x 5.8 cm. Cyst within the left ovary measures approximately 4.8 cm. Previously this measured 3.7 cm. Pulsed Doppler evaluation demonstrates normal low-resistance arterial and venous waveforms in the right ovary. Suboptimal Doppler evaluation of the left ovary due to limitations of transabdominal technique. Other: No free fluid. IMPRESSION: 1. No  acute findings. No evidence for right ovarian torsion. Suboptimal Doppler evaluation of the left ovary. 2. Left ovarian cyst measures approximately 4.8 cm. Previously this measured 3.7 cm. Recommend followup US in 3-6 months. Note: This recommendation does not apply to premenarchal patients or to those with increased risk (genetic, family history, elevated tumor markers or other high-risk factors) of ovarian cancer. Reference: Radiology 2019 Nov; 293(2):359-371. Electronically Signed   By: Kerby Moors M.D.   On: 08/25/2022 09:01   CT Renal Stone Study  Result Date: 08/25/2022 CLINICAL DATA:  Left flank pain. EXAM: CT ABDOMEN AND PELVIS WITHOUT CONTRAST TECHNIQUE: Multidetector CT imaging of the abdomen and pelvis was performed following the standard protocol without IV contrast. RADIATION DOSE REDUCTION: This exam was performed according to the departmental dose-optimization program which includes automated exposure control, adjustment of the mA and/or kV according to patient size and/or use of iterative reconstruction technique. COMPARISON:  June 27, 2021 FINDINGS: Lower chest: There is a small, predominantly anterior pericardial effusion. This measures 6 mm in thickness and represents a new  finding when compared to the prior study. Hepatobiliary: A 15 mm diameter cyst is seen within the left lobe of the liver. No gallstones, gallbladder wall thickening, or biliary dilatation. Pancreas: Unremarkable. No pancreatic ductal dilatation or surrounding inflammatory changes. Spleen: Normal in size without focal abnormality. Adrenals/Urinary Tract: Adrenal glands are unremarkable. Kidneys are atrophic in size, without focal lesions. Multiple bilateral subcentimeter nonobstructing renal calculi are noted. The urinary bladder is poorly distended and subsequently limited in evaluation. Mild urinary bladder wall thickening is seen with a mild amount of surrounding inflammatory fat stranding. Stomach/Bowel: Stomach is within normal limits. Appendix appears normal. No evidence of bowel wall thickening, distention, or inflammatory changes. Vascular/Lymphatic: Aortic atherosclerosis. Subcentimeter para-aortic and aortocaval lymph nodes are noted. Reproductive: Status post hysterectomy. A 5.8 cm x 4.3 cm x 6.8 cm complex left adnexal mass is seen (axial CT images 61 through 74, CT series 3). A 17 mm diameter simple cyst is seen along the medial aspect of the right adnexa (axial CT image 71, CT series 3). Other: No abdominal wall hernia or abnormality. No abdominopelvic ascites. Musculoskeletal: No acute or significant osseous findings. IMPRESSION: 1. Findings which may represent mild cystitis. Correlation with urinalysis is recommended. 2. Multiple bilateral subcentimeter nonobstructing renal calculi. 3. Complex left adnexal mass, as described above. Further evaluation with nonemergent pelvic ultrasound is recommended. 4. Small, predominantly anterior pericardial effusion. 5. Aortic atherosclerosis. Aortic Atherosclerosis (ICD10-I70.0). Electronically Signed   By: Virgina Norfolk M.D.   On: 08/25/2022 02:37   DG Chest 2 View  Result Date: 08/21/2022 CLINICAL DATA:  Productive cough with wheezing and shortness of  breath. Congestion x1 week. EXAM: CHEST - 2 VIEW COMPARISON:  Chest radiograph June 30, 2022 FINDINGS: Sided dialysis catheter with tip at the superior cavoatrial junction. Similar cardiac enlargement and mild central vascular prominence. No overt pulmonary edema or focal airspace consolidation. No pleural effusion or pneumothorax. The visualized skeletal structures are unremarkable. IMPRESSION: Cardiomegaly and mild central vascular prominence without overt pulmonary edema or focal airspace consolidation. Electronically Signed   By: Dahlia Bailiff M.D.   On: 08/21/2022 11:36     Assessment/Plan 1. ESRD (end stage renal disease) ([place]) Recommend:   The patient is experiencing increasing problems with their dialysis access.   Patient should have a fistulagram with the intention for intervention.  The intention for intervention is to restore appropriate flow and prevent thrombosis and possible loss of the access.  As well as improve the quality of dialysis therapy.   The risks, benefits and alternative therapies were reviewed in detail with the patient.  All questions were answered.  The patient agrees to proceed with angio/intervention.     The patient will follow up with me in the office after the procedure.     2. Essential hypertension Continue antihypertensive medications as already ordered, these medications have been reviewed and there are no changes at this time.   [name], MD  09/09/2022 8:28 AM

## 2022-09-10 ENCOUNTER — Encounter: Payer: Self-pay | Admitting: Vascular Surgery

## 2022-09-10 DIAGNOSIS — E8779 Other fluid overload: Secondary | ICD-10-CM | POA: Diagnosis not present

## 2022-09-10 DIAGNOSIS — Z992 Dependence on renal dialysis: Secondary | ICD-10-CM | POA: Diagnosis not present

## 2022-09-10 DIAGNOSIS — N186 End stage renal disease: Secondary | ICD-10-CM | POA: Diagnosis not present

## 2022-09-11 DIAGNOSIS — E8779 Other fluid overload: Secondary | ICD-10-CM | POA: Diagnosis not present

## 2022-09-11 DIAGNOSIS — Z992 Dependence on renal dialysis: Secondary | ICD-10-CM | POA: Diagnosis not present

## 2022-09-11 DIAGNOSIS — N186 End stage renal disease: Secondary | ICD-10-CM | POA: Diagnosis not present

## 2022-09-12 DIAGNOSIS — N186 End stage renal disease: Secondary | ICD-10-CM | POA: Diagnosis not present

## 2022-09-12 DIAGNOSIS — Z992 Dependence on renal dialysis: Secondary | ICD-10-CM | POA: Diagnosis not present

## 2022-09-12 DIAGNOSIS — E8779 Other fluid overload: Secondary | ICD-10-CM | POA: Diagnosis not present

## 2022-09-13 DIAGNOSIS — Z992 Dependence on renal dialysis: Secondary | ICD-10-CM | POA: Diagnosis not present

## 2022-09-13 DIAGNOSIS — N186 End stage renal disease: Secondary | ICD-10-CM | POA: Diagnosis not present

## 2022-09-13 DIAGNOSIS — E8779 Other fluid overload: Secondary | ICD-10-CM | POA: Diagnosis not present

## 2022-09-14 DIAGNOSIS — Z992 Dependence on renal dialysis: Secondary | ICD-10-CM | POA: Diagnosis not present

## 2022-09-14 DIAGNOSIS — E8779 Other fluid overload: Secondary | ICD-10-CM | POA: Diagnosis not present

## 2022-09-14 DIAGNOSIS — N186 End stage renal disease: Secondary | ICD-10-CM | POA: Diagnosis not present

## 2022-09-15 DIAGNOSIS — N186 End stage renal disease: Secondary | ICD-10-CM | POA: Diagnosis not present

## 2022-09-15 DIAGNOSIS — Z992 Dependence on renal dialysis: Secondary | ICD-10-CM | POA: Diagnosis not present

## 2022-09-15 DIAGNOSIS — E8779 Other fluid overload: Secondary | ICD-10-CM | POA: Diagnosis not present

## 2022-09-16 DIAGNOSIS — N186 End stage renal disease: Secondary | ICD-10-CM | POA: Diagnosis not present

## 2022-09-16 DIAGNOSIS — E8779 Other fluid overload: Secondary | ICD-10-CM | POA: Diagnosis not present

## 2022-09-16 DIAGNOSIS — Z992 Dependence on renal dialysis: Secondary | ICD-10-CM | POA: Diagnosis not present

## 2022-09-17 DIAGNOSIS — Z992 Dependence on renal dialysis: Secondary | ICD-10-CM | POA: Diagnosis not present

## 2022-09-17 DIAGNOSIS — N186 End stage renal disease: Secondary | ICD-10-CM | POA: Diagnosis not present

## 2022-09-17 DIAGNOSIS — E8779 Other fluid overload: Secondary | ICD-10-CM | POA: Diagnosis not present

## 2022-09-18 DIAGNOSIS — Z992 Dependence on renal dialysis: Secondary | ICD-10-CM | POA: Diagnosis not present

## 2022-09-18 DIAGNOSIS — E8779 Other fluid overload: Secondary | ICD-10-CM | POA: Diagnosis not present

## 2022-09-18 DIAGNOSIS — N186 End stage renal disease: Secondary | ICD-10-CM | POA: Diagnosis not present

## 2022-09-19 DIAGNOSIS — N186 End stage renal disease: Secondary | ICD-10-CM | POA: Diagnosis not present

## 2022-09-19 DIAGNOSIS — E8779 Other fluid overload: Secondary | ICD-10-CM | POA: Diagnosis not present

## 2022-09-19 DIAGNOSIS — Z992 Dependence on renal dialysis: Secondary | ICD-10-CM | POA: Diagnosis not present

## 2022-09-20 DIAGNOSIS — E8779 Other fluid overload: Secondary | ICD-10-CM | POA: Diagnosis not present

## 2022-09-20 DIAGNOSIS — Z992 Dependence on renal dialysis: Secondary | ICD-10-CM | POA: Diagnosis not present

## 2022-09-20 DIAGNOSIS — N186 End stage renal disease: Secondary | ICD-10-CM | POA: Diagnosis not present

## 2022-09-21 DIAGNOSIS — N186 End stage renal disease: Secondary | ICD-10-CM | POA: Diagnosis not present

## 2022-09-21 DIAGNOSIS — Z992 Dependence on renal dialysis: Secondary | ICD-10-CM | POA: Diagnosis not present

## 2022-09-21 DIAGNOSIS — E8779 Other fluid overload: Secondary | ICD-10-CM | POA: Diagnosis not present

## 2022-09-22 DIAGNOSIS — N186 End stage renal disease: Secondary | ICD-10-CM | POA: Diagnosis not present

## 2022-09-22 DIAGNOSIS — Z992 Dependence on renal dialysis: Secondary | ICD-10-CM | POA: Diagnosis not present

## 2022-09-22 DIAGNOSIS — E8779 Other fluid overload: Secondary | ICD-10-CM | POA: Diagnosis not present

## 2022-09-23 DIAGNOSIS — E8779 Other fluid overload: Secondary | ICD-10-CM | POA: Diagnosis not present

## 2022-09-23 DIAGNOSIS — Z992 Dependence on renal dialysis: Secondary | ICD-10-CM | POA: Diagnosis not present

## 2022-09-23 DIAGNOSIS — N186 End stage renal disease: Secondary | ICD-10-CM | POA: Diagnosis not present

## 2022-09-24 DIAGNOSIS — E8779 Other fluid overload: Secondary | ICD-10-CM | POA: Diagnosis not present

## 2022-09-24 DIAGNOSIS — Z992 Dependence on renal dialysis: Secondary | ICD-10-CM | POA: Diagnosis not present

## 2022-09-24 DIAGNOSIS — N186 End stage renal disease: Secondary | ICD-10-CM | POA: Diagnosis not present

## 2022-09-25 DIAGNOSIS — E8779 Other fluid overload: Secondary | ICD-10-CM | POA: Diagnosis not present

## 2022-09-25 DIAGNOSIS — N186 End stage renal disease: Secondary | ICD-10-CM | POA: Diagnosis not present

## 2022-09-25 DIAGNOSIS — Z23 Encounter for immunization: Secondary | ICD-10-CM | POA: Diagnosis not present

## 2022-09-25 DIAGNOSIS — Z992 Dependence on renal dialysis: Secondary | ICD-10-CM | POA: Diagnosis not present

## 2022-09-26 DIAGNOSIS — Z992 Dependence on renal dialysis: Secondary | ICD-10-CM | POA: Diagnosis not present

## 2022-09-26 DIAGNOSIS — E8779 Other fluid overload: Secondary | ICD-10-CM | POA: Diagnosis not present

## 2022-09-26 DIAGNOSIS — Z23 Encounter for immunization: Secondary | ICD-10-CM | POA: Diagnosis not present

## 2022-09-26 DIAGNOSIS — N186 End stage renal disease: Secondary | ICD-10-CM | POA: Diagnosis not present

## 2022-09-27 DIAGNOSIS — Z23 Encounter for immunization: Secondary | ICD-10-CM | POA: Diagnosis not present

## 2022-09-27 DIAGNOSIS — Z992 Dependence on renal dialysis: Secondary | ICD-10-CM | POA: Diagnosis not present

## 2022-09-27 DIAGNOSIS — N186 End stage renal disease: Secondary | ICD-10-CM | POA: Diagnosis not present

## 2022-09-27 DIAGNOSIS — E8779 Other fluid overload: Secondary | ICD-10-CM | POA: Diagnosis not present

## 2022-09-28 DIAGNOSIS — Z23 Encounter for immunization: Secondary | ICD-10-CM | POA: Diagnosis not present

## 2022-09-28 DIAGNOSIS — N186 End stage renal disease: Secondary | ICD-10-CM | POA: Diagnosis not present

## 2022-09-28 DIAGNOSIS — E8779 Other fluid overload: Secondary | ICD-10-CM | POA: Diagnosis not present

## 2022-09-28 DIAGNOSIS — Z992 Dependence on renal dialysis: Secondary | ICD-10-CM | POA: Diagnosis not present

## 2022-09-29 DIAGNOSIS — Z23 Encounter for immunization: Secondary | ICD-10-CM | POA: Diagnosis not present

## 2022-09-29 DIAGNOSIS — N186 End stage renal disease: Secondary | ICD-10-CM | POA: Diagnosis not present

## 2022-09-29 DIAGNOSIS — Z992 Dependence on renal dialysis: Secondary | ICD-10-CM | POA: Diagnosis not present

## 2022-09-29 DIAGNOSIS — E8779 Other fluid overload: Secondary | ICD-10-CM | POA: Diagnosis not present

## 2022-09-30 ENCOUNTER — Encounter (HOSPITAL_COMMUNITY): Payer: Self-pay

## 2022-09-30 ENCOUNTER — Ambulatory Visit (HOSPITAL_COMMUNITY): Payer: Self-pay | Admitting: Psychiatry

## 2022-09-30 DIAGNOSIS — Z992 Dependence on renal dialysis: Secondary | ICD-10-CM | POA: Diagnosis not present

## 2022-09-30 DIAGNOSIS — E8779 Other fluid overload: Secondary | ICD-10-CM | POA: Diagnosis not present

## 2022-09-30 DIAGNOSIS — N186 End stage renal disease: Secondary | ICD-10-CM | POA: Diagnosis not present

## 2022-09-30 DIAGNOSIS — Z23 Encounter for immunization: Secondary | ICD-10-CM | POA: Diagnosis not present

## 2022-10-01 DIAGNOSIS — N186 End stage renal disease: Secondary | ICD-10-CM | POA: Diagnosis not present

## 2022-10-01 DIAGNOSIS — Z23 Encounter for immunization: Secondary | ICD-10-CM | POA: Diagnosis not present

## 2022-10-01 DIAGNOSIS — E8779 Other fluid overload: Secondary | ICD-10-CM | POA: Diagnosis not present

## 2022-10-01 DIAGNOSIS — Z992 Dependence on renal dialysis: Secondary | ICD-10-CM | POA: Diagnosis not present

## 2022-10-02 DIAGNOSIS — Z23 Encounter for immunization: Secondary | ICD-10-CM | POA: Diagnosis not present

## 2022-10-02 DIAGNOSIS — E8779 Other fluid overload: Secondary | ICD-10-CM | POA: Diagnosis not present

## 2022-10-02 DIAGNOSIS — N186 End stage renal disease: Secondary | ICD-10-CM | POA: Diagnosis not present

## 2022-10-02 DIAGNOSIS — Z992 Dependence on renal dialysis: Secondary | ICD-10-CM | POA: Diagnosis not present

## 2022-10-03 DIAGNOSIS — N186 End stage renal disease: Secondary | ICD-10-CM | POA: Diagnosis not present

## 2022-10-03 DIAGNOSIS — Z09 Encounter for follow-up examination after completed treatment for conditions other than malignant neoplasm: Secondary | ICD-10-CM | POA: Diagnosis not present

## 2022-10-03 DIAGNOSIS — E8779 Other fluid overload: Secondary | ICD-10-CM | POA: Diagnosis not present

## 2022-10-03 DIAGNOSIS — N83202 Unspecified ovarian cyst, left side: Secondary | ICD-10-CM | POA: Diagnosis not present

## 2022-10-03 DIAGNOSIS — Z23 Encounter for immunization: Secondary | ICD-10-CM | POA: Diagnosis not present

## 2022-10-03 DIAGNOSIS — Z992 Dependence on renal dialysis: Secondary | ICD-10-CM | POA: Diagnosis not present

## 2022-10-04 DIAGNOSIS — Z23 Encounter for immunization: Secondary | ICD-10-CM | POA: Diagnosis not present

## 2022-10-04 DIAGNOSIS — E8779 Other fluid overload: Secondary | ICD-10-CM | POA: Diagnosis not present

## 2022-10-04 DIAGNOSIS — Z992 Dependence on renal dialysis: Secondary | ICD-10-CM | POA: Diagnosis not present

## 2022-10-04 DIAGNOSIS — N186 End stage renal disease: Secondary | ICD-10-CM | POA: Diagnosis not present

## 2022-10-05 DIAGNOSIS — Z23 Encounter for immunization: Secondary | ICD-10-CM | POA: Diagnosis not present

## 2022-10-05 DIAGNOSIS — Z992 Dependence on renal dialysis: Secondary | ICD-10-CM | POA: Diagnosis not present

## 2022-10-05 DIAGNOSIS — N186 End stage renal disease: Secondary | ICD-10-CM | POA: Diagnosis not present

## 2022-10-05 DIAGNOSIS — E8779 Other fluid overload: Secondary | ICD-10-CM | POA: Diagnosis not present

## 2022-10-06 ENCOUNTER — Encounter (HOSPITAL_BASED_OUTPATIENT_CLINIC_OR_DEPARTMENT_OTHER): Payer: Self-pay

## 2022-10-06 DIAGNOSIS — N186 End stage renal disease: Secondary | ICD-10-CM | POA: Diagnosis not present

## 2022-10-06 DIAGNOSIS — E8779 Other fluid overload: Secondary | ICD-10-CM | POA: Diagnosis not present

## 2022-10-06 DIAGNOSIS — G4710 Hypersomnia, unspecified: Secondary | ICD-10-CM

## 2022-10-06 DIAGNOSIS — Z992 Dependence on renal dialysis: Secondary | ICD-10-CM | POA: Diagnosis not present

## 2022-10-06 DIAGNOSIS — R5383 Other fatigue: Secondary | ICD-10-CM

## 2022-10-06 DIAGNOSIS — G4733 Obstructive sleep apnea (adult) (pediatric): Secondary | ICD-10-CM

## 2022-10-06 DIAGNOSIS — Z23 Encounter for immunization: Secondary | ICD-10-CM | POA: Diagnosis not present

## 2022-10-06 DIAGNOSIS — G4709 Other insomnia: Secondary | ICD-10-CM

## 2022-10-06 DIAGNOSIS — R0683 Snoring: Secondary | ICD-10-CM

## 2022-10-07 DIAGNOSIS — Z992 Dependence on renal dialysis: Secondary | ICD-10-CM | POA: Diagnosis not present

## 2022-10-07 DIAGNOSIS — N186 End stage renal disease: Secondary | ICD-10-CM | POA: Diagnosis not present

## 2022-10-07 DIAGNOSIS — Z23 Encounter for immunization: Secondary | ICD-10-CM | POA: Diagnosis not present

## 2022-10-07 DIAGNOSIS — E8779 Other fluid overload: Secondary | ICD-10-CM | POA: Diagnosis not present

## 2022-10-08 DIAGNOSIS — Z992 Dependence on renal dialysis: Secondary | ICD-10-CM | POA: Diagnosis not present

## 2022-10-08 DIAGNOSIS — N186 End stage renal disease: Secondary | ICD-10-CM | POA: Diagnosis not present

## 2022-10-08 DIAGNOSIS — Z23 Encounter for immunization: Secondary | ICD-10-CM | POA: Diagnosis not present

## 2022-10-08 DIAGNOSIS — E8779 Other fluid overload: Secondary | ICD-10-CM | POA: Diagnosis not present

## 2022-10-09 DIAGNOSIS — E8779 Other fluid overload: Secondary | ICD-10-CM | POA: Diagnosis not present

## 2022-10-09 DIAGNOSIS — Z992 Dependence on renal dialysis: Secondary | ICD-10-CM | POA: Diagnosis not present

## 2022-10-09 DIAGNOSIS — Z23 Encounter for immunization: Secondary | ICD-10-CM | POA: Diagnosis not present

## 2022-10-09 DIAGNOSIS — N186 End stage renal disease: Secondary | ICD-10-CM | POA: Diagnosis not present

## 2022-10-10 DIAGNOSIS — E8779 Other fluid overload: Secondary | ICD-10-CM | POA: Diagnosis not present

## 2022-10-10 DIAGNOSIS — N186 End stage renal disease: Secondary | ICD-10-CM | POA: Diagnosis not present

## 2022-10-10 DIAGNOSIS — Z992 Dependence on renal dialysis: Secondary | ICD-10-CM | POA: Diagnosis not present

## 2022-10-10 DIAGNOSIS — Z23 Encounter for immunization: Secondary | ICD-10-CM | POA: Diagnosis not present

## 2022-10-11 DIAGNOSIS — N186 End stage renal disease: Secondary | ICD-10-CM | POA: Diagnosis not present

## 2022-10-11 DIAGNOSIS — Z992 Dependence on renal dialysis: Secondary | ICD-10-CM | POA: Diagnosis not present

## 2022-10-11 DIAGNOSIS — E8779 Other fluid overload: Secondary | ICD-10-CM | POA: Diagnosis not present

## 2022-10-11 DIAGNOSIS — Z23 Encounter for immunization: Secondary | ICD-10-CM | POA: Diagnosis not present

## 2022-10-12 DIAGNOSIS — N186 End stage renal disease: Secondary | ICD-10-CM | POA: Diagnosis not present

## 2022-10-12 DIAGNOSIS — Z23 Encounter for immunization: Secondary | ICD-10-CM | POA: Diagnosis not present

## 2022-10-12 DIAGNOSIS — E8779 Other fluid overload: Secondary | ICD-10-CM | POA: Diagnosis not present

## 2022-10-12 DIAGNOSIS — Z992 Dependence on renal dialysis: Secondary | ICD-10-CM | POA: Diagnosis not present

## 2022-10-13 DIAGNOSIS — E8779 Other fluid overload: Secondary | ICD-10-CM | POA: Diagnosis not present

## 2022-10-13 DIAGNOSIS — Z992 Dependence on renal dialysis: Secondary | ICD-10-CM | POA: Diagnosis not present

## 2022-10-13 DIAGNOSIS — Z23 Encounter for immunization: Secondary | ICD-10-CM | POA: Diagnosis not present

## 2022-10-13 DIAGNOSIS — N186 End stage renal disease: Secondary | ICD-10-CM | POA: Diagnosis not present

## 2022-10-14 DIAGNOSIS — Y838 Other surgical procedures as the cause of abnormal reaction of the patient, or of later complication, without mention of misadventure at the time of the procedure: Secondary | ICD-10-CM | POA: Diagnosis not present

## 2022-10-14 DIAGNOSIS — N186 End stage renal disease: Secondary | ICD-10-CM | POA: Diagnosis not present

## 2022-10-14 DIAGNOSIS — N99841 Postprocedural hematoma of a genitourinary system organ or structure following other procedure: Secondary | ICD-10-CM | POA: Diagnosis not present

## 2022-10-14 DIAGNOSIS — N99840 Postprocedural hematoma of a genitourinary system organ or structure following a genitourinary system procedure: Secondary | ICD-10-CM | POA: Diagnosis not present

## 2022-10-14 DIAGNOSIS — Z23 Encounter for immunization: Secondary | ICD-10-CM | POA: Diagnosis not present

## 2022-10-14 DIAGNOSIS — E8779 Other fluid overload: Secondary | ICD-10-CM | POA: Diagnosis not present

## 2022-10-14 DIAGNOSIS — Z90710 Acquired absence of both cervix and uterus: Secondary | ICD-10-CM | POA: Diagnosis not present

## 2022-10-14 DIAGNOSIS — Z992 Dependence on renal dialysis: Secondary | ICD-10-CM | POA: Diagnosis not present

## 2022-10-15 DIAGNOSIS — Z23 Encounter for immunization: Secondary | ICD-10-CM | POA: Diagnosis not present

## 2022-10-15 DIAGNOSIS — Z992 Dependence on renal dialysis: Secondary | ICD-10-CM | POA: Diagnosis not present

## 2022-10-15 DIAGNOSIS — E8779 Other fluid overload: Secondary | ICD-10-CM | POA: Diagnosis not present

## 2022-10-15 DIAGNOSIS — N186 End stage renal disease: Secondary | ICD-10-CM | POA: Diagnosis not present

## 2022-10-16 DIAGNOSIS — E8779 Other fluid overload: Secondary | ICD-10-CM | POA: Diagnosis not present

## 2022-10-16 DIAGNOSIS — Z992 Dependence on renal dialysis: Secondary | ICD-10-CM | POA: Diagnosis not present

## 2022-10-16 DIAGNOSIS — N186 End stage renal disease: Secondary | ICD-10-CM | POA: Diagnosis not present

## 2022-10-16 DIAGNOSIS — Z23 Encounter for immunization: Secondary | ICD-10-CM | POA: Diagnosis not present

## 2022-10-17 DIAGNOSIS — Z23 Encounter for immunization: Secondary | ICD-10-CM | POA: Diagnosis not present

## 2022-10-17 DIAGNOSIS — E8779 Other fluid overload: Secondary | ICD-10-CM | POA: Diagnosis not present

## 2022-10-17 DIAGNOSIS — Z992 Dependence on renal dialysis: Secondary | ICD-10-CM | POA: Diagnosis not present

## 2022-10-17 DIAGNOSIS — N186 End stage renal disease: Secondary | ICD-10-CM | POA: Diagnosis not present

## 2022-10-18 DIAGNOSIS — N186 End stage renal disease: Secondary | ICD-10-CM | POA: Diagnosis not present

## 2022-10-18 DIAGNOSIS — E8779 Other fluid overload: Secondary | ICD-10-CM | POA: Diagnosis not present

## 2022-10-18 DIAGNOSIS — Z23 Encounter for immunization: Secondary | ICD-10-CM | POA: Diagnosis not present

## 2022-10-18 DIAGNOSIS — Z992 Dependence on renal dialysis: Secondary | ICD-10-CM | POA: Diagnosis not present

## 2022-10-19 DIAGNOSIS — Z992 Dependence on renal dialysis: Secondary | ICD-10-CM | POA: Diagnosis not present

## 2022-10-19 DIAGNOSIS — N186 End stage renal disease: Secondary | ICD-10-CM | POA: Diagnosis not present

## 2022-10-19 DIAGNOSIS — E8779 Other fluid overload: Secondary | ICD-10-CM | POA: Diagnosis not present

## 2022-10-19 DIAGNOSIS — Z23 Encounter for immunization: Secondary | ICD-10-CM | POA: Diagnosis not present

## 2022-10-20 DIAGNOSIS — Z23 Encounter for immunization: Secondary | ICD-10-CM | POA: Diagnosis not present

## 2022-10-20 DIAGNOSIS — E8779 Other fluid overload: Secondary | ICD-10-CM | POA: Diagnosis not present

## 2022-10-20 DIAGNOSIS — N186 End stage renal disease: Secondary | ICD-10-CM | POA: Diagnosis not present

## 2022-10-20 DIAGNOSIS — Z992 Dependence on renal dialysis: Secondary | ICD-10-CM | POA: Diagnosis not present

## 2022-10-21 ENCOUNTER — Ambulatory Visit (HOSPITAL_BASED_OUTPATIENT_CLINIC_OR_DEPARTMENT_OTHER): Payer: Medicare Other | Attending: Nephrology | Admitting: Internal Medicine

## 2022-10-21 VITALS — Ht 67.0 in | Wt 218.0 lb

## 2022-10-21 DIAGNOSIS — G4700 Insomnia, unspecified: Secondary | ICD-10-CM | POA: Insufficient documentation

## 2022-10-21 DIAGNOSIS — E8779 Other fluid overload: Secondary | ICD-10-CM | POA: Diagnosis not present

## 2022-10-21 DIAGNOSIS — R0683 Snoring: Secondary | ICD-10-CM | POA: Insufficient documentation

## 2022-10-21 DIAGNOSIS — R0681 Apnea, not elsewhere classified: Secondary | ICD-10-CM | POA: Insufficient documentation

## 2022-10-21 DIAGNOSIS — R5383 Other fatigue: Secondary | ICD-10-CM

## 2022-10-21 DIAGNOSIS — G4709 Other insomnia: Secondary | ICD-10-CM

## 2022-10-21 DIAGNOSIS — G4710 Hypersomnia, unspecified: Secondary | ICD-10-CM | POA: Insufficient documentation

## 2022-10-21 DIAGNOSIS — G4733 Obstructive sleep apnea (adult) (pediatric): Secondary | ICD-10-CM | POA: Diagnosis not present

## 2022-10-21 DIAGNOSIS — Z23 Encounter for immunization: Secondary | ICD-10-CM | POA: Diagnosis not present

## 2022-10-21 DIAGNOSIS — Z992 Dependence on renal dialysis: Secondary | ICD-10-CM | POA: Diagnosis not present

## 2022-10-21 DIAGNOSIS — N186 End stage renal disease: Secondary | ICD-10-CM | POA: Diagnosis not present

## 2022-10-22 DIAGNOSIS — E8779 Other fluid overload: Secondary | ICD-10-CM | POA: Diagnosis not present

## 2022-10-22 DIAGNOSIS — N186 End stage renal disease: Secondary | ICD-10-CM | POA: Diagnosis not present

## 2022-10-22 DIAGNOSIS — Z23 Encounter for immunization: Secondary | ICD-10-CM | POA: Diagnosis not present

## 2022-10-22 DIAGNOSIS — Z992 Dependence on renal dialysis: Secondary | ICD-10-CM | POA: Diagnosis not present

## 2022-10-23 DIAGNOSIS — N186 End stage renal disease: Secondary | ICD-10-CM | POA: Diagnosis not present

## 2022-10-23 DIAGNOSIS — Z992 Dependence on renal dialysis: Secondary | ICD-10-CM | POA: Diagnosis not present

## 2022-10-23 DIAGNOSIS — Z23 Encounter for immunization: Secondary | ICD-10-CM | POA: Diagnosis not present

## 2022-10-23 DIAGNOSIS — E8779 Other fluid overload: Secondary | ICD-10-CM | POA: Diagnosis not present

## 2022-10-24 DIAGNOSIS — Z992 Dependence on renal dialysis: Secondary | ICD-10-CM | POA: Diagnosis not present

## 2022-10-24 DIAGNOSIS — N186 End stage renal disease: Secondary | ICD-10-CM | POA: Diagnosis not present

## 2022-10-24 DIAGNOSIS — E8779 Other fluid overload: Secondary | ICD-10-CM | POA: Diagnosis not present

## 2022-10-25 DIAGNOSIS — E8779 Other fluid overload: Secondary | ICD-10-CM | POA: Diagnosis not present

## 2022-10-25 DIAGNOSIS — N186 End stage renal disease: Secondary | ICD-10-CM | POA: Diagnosis not present

## 2022-10-25 DIAGNOSIS — Z992 Dependence on renal dialysis: Secondary | ICD-10-CM | POA: Diagnosis not present

## 2022-10-26 DIAGNOSIS — Z992 Dependence on renal dialysis: Secondary | ICD-10-CM | POA: Diagnosis not present

## 2022-10-26 DIAGNOSIS — E8779 Other fluid overload: Secondary | ICD-10-CM | POA: Diagnosis not present

## 2022-10-26 DIAGNOSIS — R0683 Snoring: Secondary | ICD-10-CM | POA: Diagnosis not present

## 2022-10-26 DIAGNOSIS — N186 End stage renal disease: Secondary | ICD-10-CM | POA: Diagnosis not present

## 2022-10-26 NOTE — Procedures (Signed)
Patient Name: Holly [name], Raduenz Date: 10/21/2022 Gender: Female D.O.B: 1985/11/03 Age (years): 36 Referring Provider: Murlean Iba Height (inches): 67 Interpreting Physician: Baird Lyons MD, ABSM Weight (lbs): 218 RPSGT: Laren Everts BMI: 34 MRN: [id number] Neck Size: 17.25  CLINICAL INFORMATION Sleep Study Type: NPSG Indication for sleep study: Diabetes, Excessive Daytime Sleepiness, Fatigue, Hypertension, Morning Headaches, Obesity, Sleep walking/talking/parasomnias, Snoring, Witnessed Apneas Epworth Sleepiness Score: 15  SLEEP STUDY TECHNIQUE As per the AASM Manual for the Scoring of Sleep and Associated Events v2.3 (April 2016) with a hypopnea requiring 4% desaturations.  The channels recorded and monitored were frontal, central and occipital EEG, electrooculogram (EOG), submentalis EMG (chin), nasal and oral airflow, thoracic and abdominal wall motion, anterior tibialis EMG, snore microphone, electrocardiogram, and pulse oximetry.  MEDICATIONS Medications self-administered by patient taken the night of the study : none reported  SLEEP ARCHITECTURE The study was initiated at 9:49:31 PM and ended at 5:08:22 AM.  Sleep onset time was 95.8 minutes and the sleep efficiency was 67.9%. The total sleep time was 298 minutes.  Stage REM latency was 46.5 minutes.  The patient spent 6.7% of the night in stage N1 sleep, 58.2% in stage N2 sleep, 1.7% in stage N3 and 33.4% in REM.  Alpha intrusion was absent.  Supine sleep was 14.93%.  RESPIRATORY PARAMETERS The overall apnea/hypopnea index (AHI) was 6.8 per hour. There were 5 total apneas, including 2 obstructive, 3 central and 0 mixed apneas. There were 29 hypopneas and 114 RERAs.  The AHI during Stage REM sleep was 7.2 per hour.  AHI while supine was 24.3 per hour.  The mean oxygen saturation was 95.9%. The minimum SpO2 during sleep was 87.0%.  moderate snoring was noted during this study.  CARDIAC DATA The 2  lead EKG demonstrated sinus rhythm. The mean heart rate was 79.4 beats per minute. Other EKG findings include: None.  LEG MOVEMENT DATA The total PLMS were 0 with a resulting PLMS index of 0.0. Associated arousal with leg movement index was 6.4 .  IMPRESSIONS - Mild obstructive sleep apnea occurred during this study (AHI = 6.8/h). - No significant central sleep apnea occurred during this study (CAI = 0.6/h). - Mild oxygen desaturation was noted during this study (Min O2 = 87.0%, Mean 95.9%). - The patient snored with moderate snoring volume. - No cardiac abnormalities were noted during this study. - Limb Movement total 131 (26.4/ hr). Limb movement with arousal/ awakening total 32 (6.4/ hr).  DIAGNOSIS - Obstructive Sleep Apnea (G47.33)  RECOMMENDATIONS - Treatment for very mild OSA is guided by symptoms and co-morbidity. Conservative measures may include observation, weight loss and sleep position off back. Other options, including CPAP, a fitted oral appliance, or ENT evaluation, would be based on clinical judgment. -  Consider trial of Mirapex, Requip, or Sinemet for treatment of Periodic Leg Movements of Sleep if appropriatee.. - Be careful with alcohol, sedatives and other CNS depressants that may worsen sleep apnea and disrupt normal sleep architecture. - Sleep hygiene should be reviewed to assess factors that may improve sleep quality. - Weight management and regular exercise should be initiated or continued if appropriate.  [Electronically signed] 10/26/2022 12:16 PM  Baird Lyons MD, [place], American Board of Sleep Medicine NPI: [id number]                        [place], [employer] [facility]  ELECTRONICALLY SIGNED ON:  10/26/2022, 12:10 PM [facility] PH: [phone]   FX: [phone] ACCREDITED BY THE AMERICAN ACADEMY OF SLEEP MEDICINE

## 2022-10-27 ENCOUNTER — Other Ambulatory Visit: Payer: Self-pay

## 2022-10-27 DIAGNOSIS — E8779 Other fluid overload: Secondary | ICD-10-CM | POA: Diagnosis not present

## 2022-10-27 DIAGNOSIS — G4733 Obstructive sleep apnea (adult) (pediatric): Secondary | ICD-10-CM

## 2022-10-27 DIAGNOSIS — N186 End stage renal disease: Secondary | ICD-10-CM | POA: Diagnosis not present

## 2022-10-27 DIAGNOSIS — Z992 Dependence on renal dialysis: Secondary | ICD-10-CM | POA: Diagnosis not present

## 2022-10-28 DIAGNOSIS — E8779 Other fluid overload: Secondary | ICD-10-CM | POA: Diagnosis not present

## 2022-10-28 DIAGNOSIS — N186 End stage renal disease: Secondary | ICD-10-CM | POA: Diagnosis not present

## 2022-10-28 DIAGNOSIS — Z992 Dependence on renal dialysis: Secondary | ICD-10-CM | POA: Diagnosis not present

## 2022-10-28 NOTE — Progress Notes (Signed)
CPAP ordered per patient

## 2022-10-29 ENCOUNTER — Ambulatory Visit (HOSPITAL_COMMUNITY): Payer: Self-pay | Admitting: Psychiatry

## 2022-10-29 DIAGNOSIS — Z992 Dependence on renal dialysis: Secondary | ICD-10-CM | POA: Diagnosis not present

## 2022-10-29 DIAGNOSIS — E8779 Other fluid overload: Secondary | ICD-10-CM | POA: Diagnosis not present

## 2022-10-29 DIAGNOSIS — N186 End stage renal disease: Secondary | ICD-10-CM | POA: Diagnosis not present

## 2022-10-30 DIAGNOSIS — N186 End stage renal disease: Secondary | ICD-10-CM | POA: Diagnosis not present

## 2022-10-30 DIAGNOSIS — E8779 Other fluid overload: Secondary | ICD-10-CM | POA: Diagnosis not present

## 2022-10-30 DIAGNOSIS — Z992 Dependence on renal dialysis: Secondary | ICD-10-CM | POA: Diagnosis not present

## 2022-10-31 DIAGNOSIS — N186 End stage renal disease: Secondary | ICD-10-CM | POA: Diagnosis not present

## 2022-10-31 DIAGNOSIS — Z992 Dependence on renal dialysis: Secondary | ICD-10-CM | POA: Diagnosis not present

## 2022-10-31 DIAGNOSIS — E8779 Other fluid overload: Secondary | ICD-10-CM | POA: Diagnosis not present

## 2022-11-01 DIAGNOSIS — N186 End stage renal disease: Secondary | ICD-10-CM | POA: Diagnosis not present

## 2022-11-01 DIAGNOSIS — E8779 Other fluid overload: Secondary | ICD-10-CM | POA: Diagnosis not present

## 2022-11-01 DIAGNOSIS — Z992 Dependence on renal dialysis: Secondary | ICD-10-CM | POA: Diagnosis not present

## 2022-11-02 DIAGNOSIS — G40109 Localization-related (focal) (partial) symptomatic epilepsy and epileptic syndromes with simple partial seizures, not intractable, without status epilepticus: Secondary | ICD-10-CM | POA: Insufficient documentation

## 2022-11-02 DIAGNOSIS — R051 Acute cough: Secondary | ICD-10-CM | POA: Diagnosis not present

## 2022-11-02 DIAGNOSIS — N186 End stage renal disease: Secondary | ICD-10-CM | POA: Diagnosis not present

## 2022-11-02 DIAGNOSIS — E8779 Other fluid overload: Secondary | ICD-10-CM | POA: Diagnosis not present

## 2022-11-02 DIAGNOSIS — Z992 Dependence on renal dialysis: Secondary | ICD-10-CM | POA: Diagnosis not present

## 2022-11-03 DIAGNOSIS — E8779 Other fluid overload: Secondary | ICD-10-CM | POA: Diagnosis not present

## 2022-11-03 DIAGNOSIS — Z992 Dependence on renal dialysis: Secondary | ICD-10-CM | POA: Diagnosis not present

## 2022-11-03 DIAGNOSIS — N186 End stage renal disease: Secondary | ICD-10-CM | POA: Diagnosis not present

## 2022-11-04 DIAGNOSIS — E8779 Other fluid overload: Secondary | ICD-10-CM | POA: Diagnosis not present

## 2022-11-04 DIAGNOSIS — Z992 Dependence on renal dialysis: Secondary | ICD-10-CM | POA: Diagnosis not present

## 2022-11-04 DIAGNOSIS — N186 End stage renal disease: Secondary | ICD-10-CM | POA: Diagnosis not present

## 2022-11-05 DIAGNOSIS — N186 End stage renal disease: Secondary | ICD-10-CM | POA: Diagnosis not present

## 2022-11-05 DIAGNOSIS — E8779 Other fluid overload: Secondary | ICD-10-CM | POA: Diagnosis not present

## 2022-11-05 DIAGNOSIS — Z992 Dependence on renal dialysis: Secondary | ICD-10-CM | POA: Diagnosis not present

## 2022-11-06 DIAGNOSIS — Z992 Dependence on renal dialysis: Secondary | ICD-10-CM | POA: Diagnosis not present

## 2022-11-06 DIAGNOSIS — E8779 Other fluid overload: Secondary | ICD-10-CM | POA: Diagnosis not present

## 2022-11-06 DIAGNOSIS — N186 End stage renal disease: Secondary | ICD-10-CM | POA: Diagnosis not present

## 2022-11-07 DIAGNOSIS — E8779 Other fluid overload: Secondary | ICD-10-CM | POA: Diagnosis not present

## 2022-11-07 DIAGNOSIS — Z992 Dependence on renal dialysis: Secondary | ICD-10-CM | POA: Diagnosis not present

## 2022-11-07 DIAGNOSIS — N186 End stage renal disease: Secondary | ICD-10-CM | POA: Diagnosis not present

## 2022-11-08 DIAGNOSIS — Z992 Dependence on renal dialysis: Secondary | ICD-10-CM | POA: Diagnosis not present

## 2022-11-08 DIAGNOSIS — N186 End stage renal disease: Secondary | ICD-10-CM | POA: Diagnosis not present

## 2022-11-08 DIAGNOSIS — E8779 Other fluid overload: Secondary | ICD-10-CM | POA: Diagnosis not present

## 2022-11-09 DIAGNOSIS — N186 End stage renal disease: Secondary | ICD-10-CM | POA: Diagnosis not present

## 2022-11-09 DIAGNOSIS — E8779 Other fluid overload: Secondary | ICD-10-CM | POA: Diagnosis not present

## 2022-11-09 DIAGNOSIS — Z992 Dependence on renal dialysis: Secondary | ICD-10-CM | POA: Diagnosis not present

## 2022-11-10 DIAGNOSIS — E8779 Other fluid overload: Secondary | ICD-10-CM | POA: Diagnosis not present

## 2022-11-10 DIAGNOSIS — Z992 Dependence on renal dialysis: Secondary | ICD-10-CM | POA: Diagnosis not present

## 2022-11-10 DIAGNOSIS — N186 End stage renal disease: Secondary | ICD-10-CM | POA: Diagnosis not present

## 2022-11-11 DIAGNOSIS — Z992 Dependence on renal dialysis: Secondary | ICD-10-CM | POA: Diagnosis not present

## 2022-11-11 DIAGNOSIS — N186 End stage renal disease: Secondary | ICD-10-CM | POA: Diagnosis not present

## 2022-11-11 DIAGNOSIS — E8779 Other fluid overload: Secondary | ICD-10-CM | POA: Diagnosis not present

## 2022-11-12 DIAGNOSIS — Z992 Dependence on renal dialysis: Secondary | ICD-10-CM | POA: Diagnosis not present

## 2022-11-12 DIAGNOSIS — E8779 Other fluid overload: Secondary | ICD-10-CM | POA: Diagnosis not present

## 2022-11-12 DIAGNOSIS — N186 End stage renal disease: Secondary | ICD-10-CM | POA: Diagnosis not present

## 2022-11-13 DIAGNOSIS — E8779 Other fluid overload: Secondary | ICD-10-CM | POA: Diagnosis not present

## 2022-11-13 DIAGNOSIS — Z992 Dependence on renal dialysis: Secondary | ICD-10-CM | POA: Diagnosis not present

## 2022-11-13 DIAGNOSIS — N186 End stage renal disease: Secondary | ICD-10-CM | POA: Diagnosis not present

## 2022-11-14 DIAGNOSIS — N186 End stage renal disease: Secondary | ICD-10-CM | POA: Diagnosis not present

## 2022-11-14 DIAGNOSIS — E8779 Other fluid overload: Secondary | ICD-10-CM | POA: Diagnosis not present

## 2022-11-14 DIAGNOSIS — Z992 Dependence on renal dialysis: Secondary | ICD-10-CM | POA: Diagnosis not present

## 2022-11-15 DIAGNOSIS — N186 End stage renal disease: Secondary | ICD-10-CM | POA: Diagnosis not present

## 2022-11-15 DIAGNOSIS — Z992 Dependence on renal dialysis: Secondary | ICD-10-CM | POA: Diagnosis not present

## 2022-11-15 DIAGNOSIS — E8779 Other fluid overload: Secondary | ICD-10-CM | POA: Diagnosis not present

## 2022-11-16 DIAGNOSIS — E8779 Other fluid overload: Secondary | ICD-10-CM | POA: Diagnosis not present

## 2022-11-16 DIAGNOSIS — N186 End stage renal disease: Secondary | ICD-10-CM | POA: Diagnosis not present

## 2022-11-16 DIAGNOSIS — Z992 Dependence on renal dialysis: Secondary | ICD-10-CM | POA: Diagnosis not present

## 2022-11-17 DIAGNOSIS — N186 End stage renal disease: Secondary | ICD-10-CM | POA: Diagnosis not present

## 2022-11-17 DIAGNOSIS — E8779 Other fluid overload: Secondary | ICD-10-CM | POA: Diagnosis not present

## 2022-11-17 DIAGNOSIS — Z992 Dependence on renal dialysis: Secondary | ICD-10-CM | POA: Diagnosis not present

## 2022-11-18 DIAGNOSIS — E8779 Other fluid overload: Secondary | ICD-10-CM | POA: Diagnosis not present

## 2022-11-18 DIAGNOSIS — Z992 Dependence on renal dialysis: Secondary | ICD-10-CM | POA: Diagnosis not present

## 2022-11-18 DIAGNOSIS — N186 End stage renal disease: Secondary | ICD-10-CM | POA: Diagnosis not present

## 2022-11-19 DIAGNOSIS — E8779 Other fluid overload: Secondary | ICD-10-CM | POA: Diagnosis not present

## 2022-11-19 DIAGNOSIS — Z992 Dependence on renal dialysis: Secondary | ICD-10-CM | POA: Diagnosis not present

## 2022-11-19 DIAGNOSIS — N186 End stage renal disease: Secondary | ICD-10-CM | POA: Diagnosis not present

## 2022-11-20 ENCOUNTER — Encounter (HOSPITAL_COMMUNITY): Payer: Self-pay | Admitting: Psychiatry

## 2022-11-20 ENCOUNTER — Ambulatory Visit (HOSPITAL_COMMUNITY): Payer: BC Managed Care – PPO | Admitting: Psychiatry

## 2022-11-20 VITALS — Wt 218.0 lb

## 2022-11-20 DIAGNOSIS — F4312 Post-traumatic stress disorder, chronic: Secondary | ICD-10-CM | POA: Diagnosis not present

## 2022-11-20 DIAGNOSIS — E8779 Other fluid overload: Secondary | ICD-10-CM | POA: Diagnosis not present

## 2022-11-20 DIAGNOSIS — N186 End stage renal disease: Secondary | ICD-10-CM | POA: Diagnosis not present

## 2022-11-20 DIAGNOSIS — F331 Major depressive disorder, recurrent, moderate: Secondary | ICD-10-CM | POA: Diagnosis not present

## 2022-11-20 DIAGNOSIS — Z992 Dependence on renal dialysis: Secondary | ICD-10-CM | POA: Diagnosis not present

## 2022-11-20 DIAGNOSIS — F411 Generalized anxiety disorder: Secondary | ICD-10-CM | POA: Diagnosis not present

## 2022-11-20 DIAGNOSIS — G4733 Obstructive sleep apnea (adult) (pediatric): Secondary | ICD-10-CM | POA: Diagnosis not present

## 2022-11-20 MED ORDER — ESCITALOPRAM OXALATE 10 MG PO TABS: ORAL_TABLET | ORAL | 0 refills | Status: DC
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
## 2022-11-21 DIAGNOSIS — Z992 Dependence on renal dialysis: Secondary | ICD-10-CM | POA: Diagnosis not present

## 2022-11-21 DIAGNOSIS — E8779 Other fluid overload: Secondary | ICD-10-CM | POA: Diagnosis not present

## 2022-11-21 DIAGNOSIS — N186 End stage renal disease: Secondary | ICD-10-CM | POA: Diagnosis not present

## 2022-11-22 DIAGNOSIS — E8779 Other fluid overload: Secondary | ICD-10-CM | POA: Diagnosis not present

## 2022-11-22 DIAGNOSIS — N186 End stage renal disease: Secondary | ICD-10-CM | POA: Diagnosis not present

## 2022-11-22 DIAGNOSIS — Z992 Dependence on renal dialysis: Secondary | ICD-10-CM | POA: Diagnosis not present

## 2022-11-23 DIAGNOSIS — E8779 Other fluid overload: Secondary | ICD-10-CM | POA: Diagnosis not present

## 2022-11-23 DIAGNOSIS — Z992 Dependence on renal dialysis: Secondary | ICD-10-CM | POA: Diagnosis not present

## 2022-11-23 DIAGNOSIS — N186 End stage renal disease: Secondary | ICD-10-CM | POA: Diagnosis not present

## 2022-11-24 DIAGNOSIS — Z992 Dependence on renal dialysis: Secondary | ICD-10-CM | POA: Diagnosis not present

## 2022-11-24 DIAGNOSIS — E8779 Other fluid overload: Secondary | ICD-10-CM | POA: Diagnosis not present

## 2022-11-24 DIAGNOSIS — N186 End stage renal disease: Secondary | ICD-10-CM | POA: Diagnosis not present

## 2022-11-24 IMAGING — CT CT ANGIO CHEST
2 of 6 series · 18 of 36 positions shown · IV contrast (agent unspecified)
Comparison: Current chest radiograph and prior studies.

CLINICAL DATA: Chest pain and shortness of breath. Suspected
pulmonary embolism. Positive D-dimer.

EXAM:
CT ANGIOGRAPHY CHEST WITH CONTRAST
TECHNIQUE: Multidetector CT imaging of the chest was performed using the
standard protocol during bolus administration of intravenous
contrast. Multiplanar CT image reconstructions and MIPs were
obtained to evaluate the vascular anatomy.

[Series 7: pe thins · axial · 0.73mm/px · z∈[-954,-676]mm · 17 of 440 slices shown]
[im 22/440  lung]
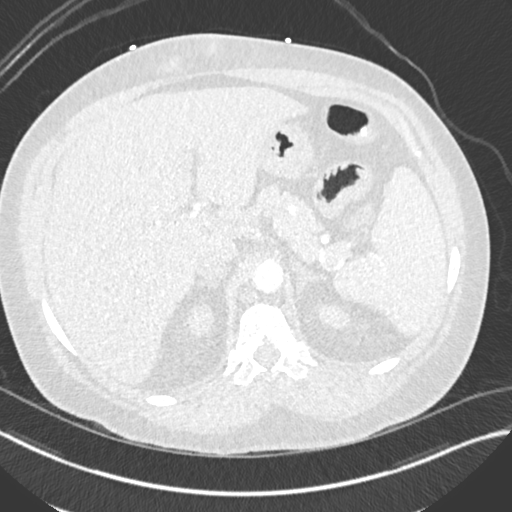
[im 44/440  mediastinal]
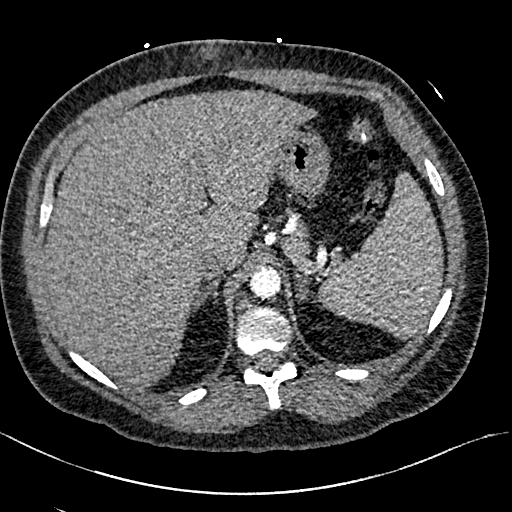
[im 66/440  lung]
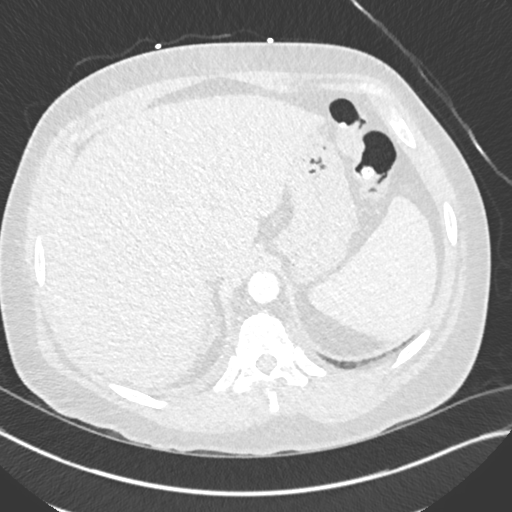
[im 88/440  mediastinal]
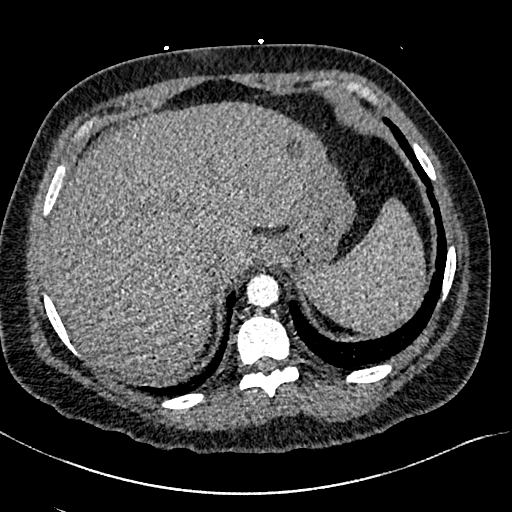
[im 132/440  lung]
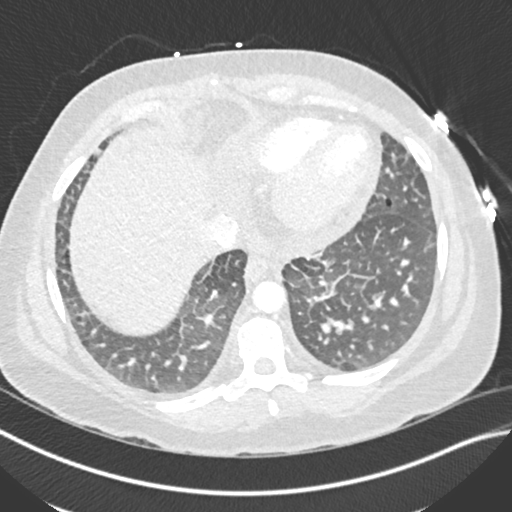
[im 154/440  mediastinal]
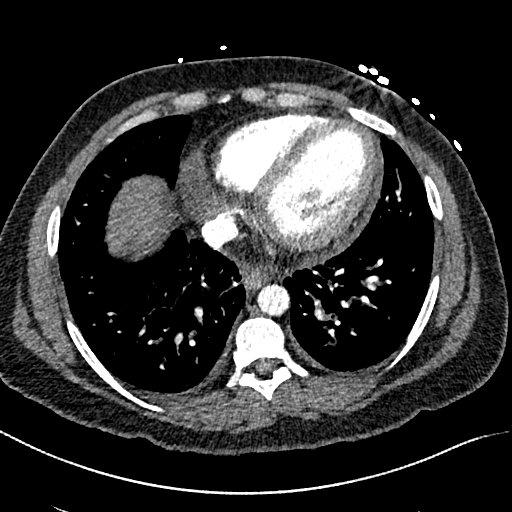
[im 176/440  lung]
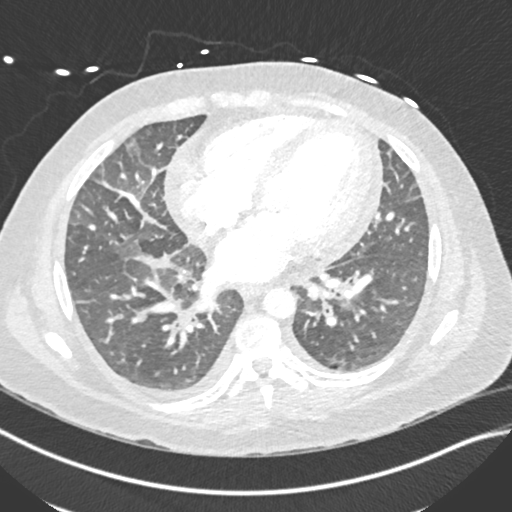
[im 198/440  mediastinal]
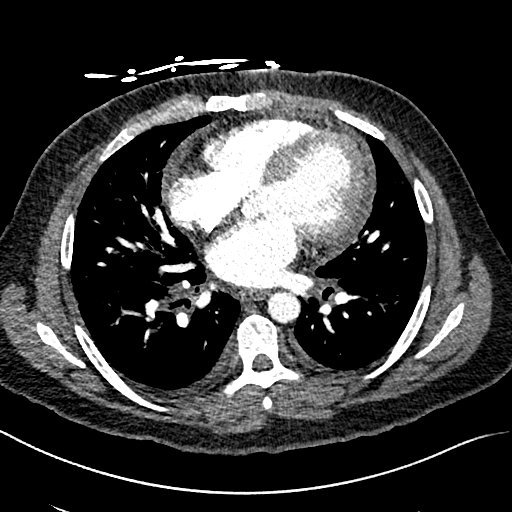
[im 220/440  lung]
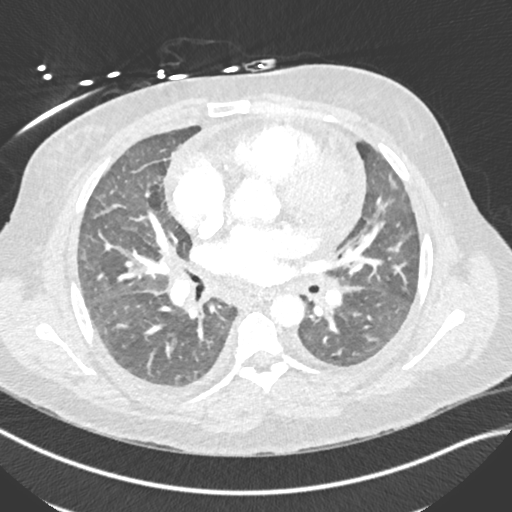
[im 242/440  mediastinal]
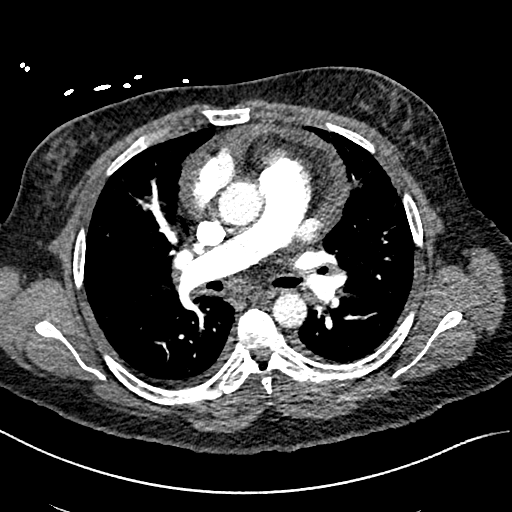
[im 264/440  lung]
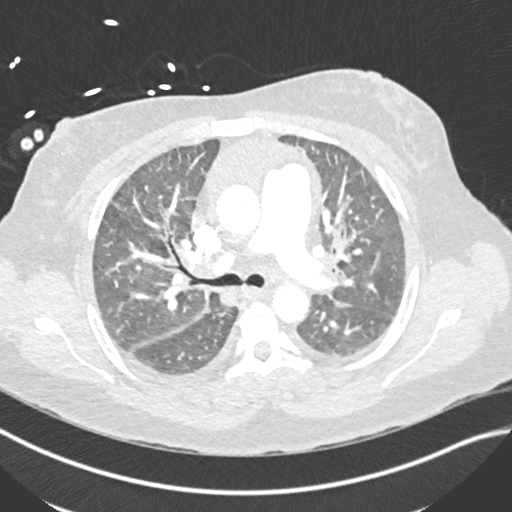
[im 286/440  mediastinal]
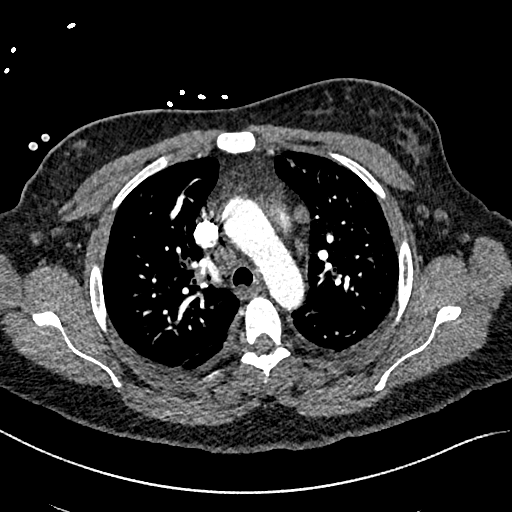
[im 308/440  lung]
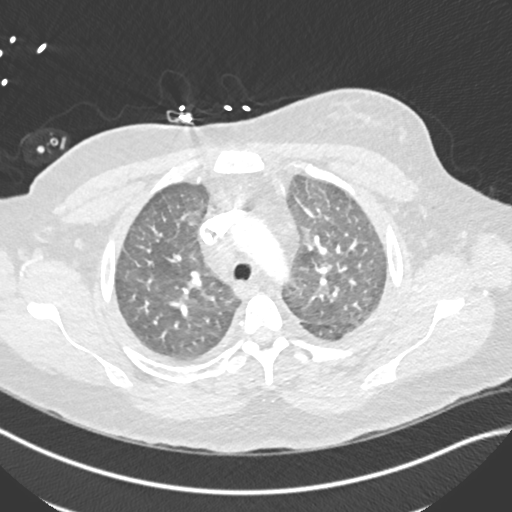
[im 352/440  mediastinal]
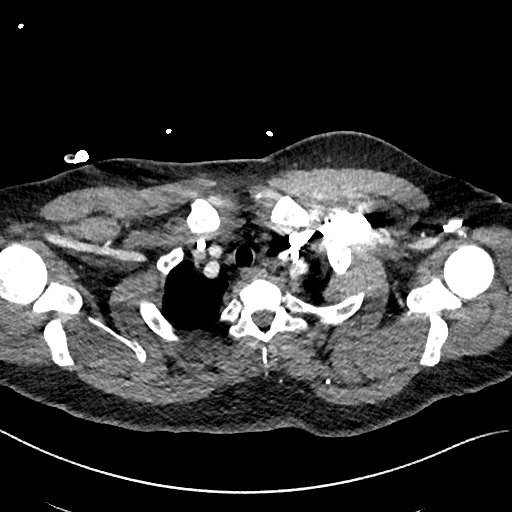
[im 374/440  lung]
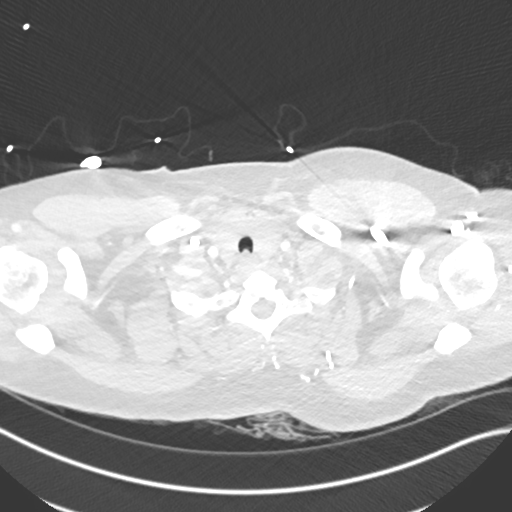
[im 396/440  mediastinal]
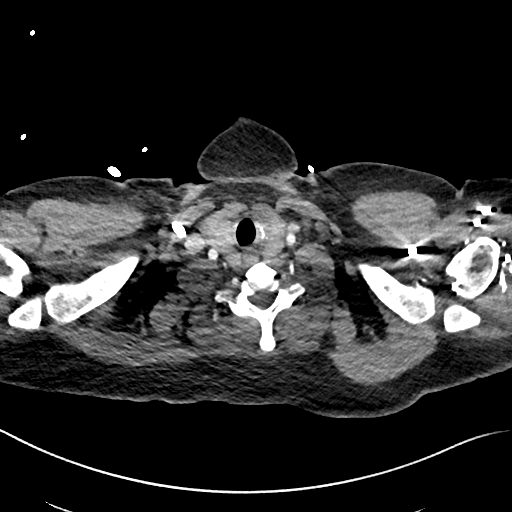
[im 418/440  lung]
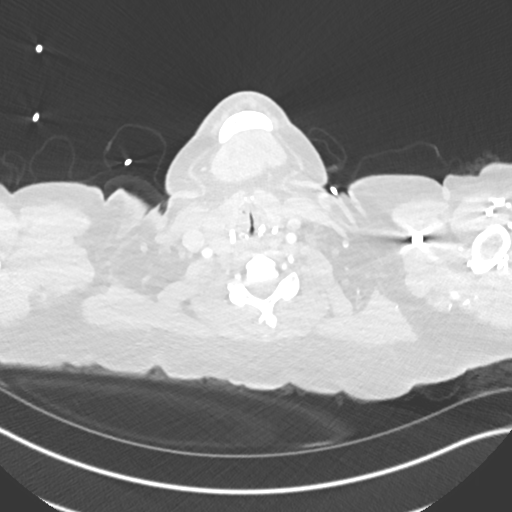

[Series 8: pe 2mm cor · coronal · 0.60mm/px · 1 of 159 slices shown]
[im 80/159  mediastinal]
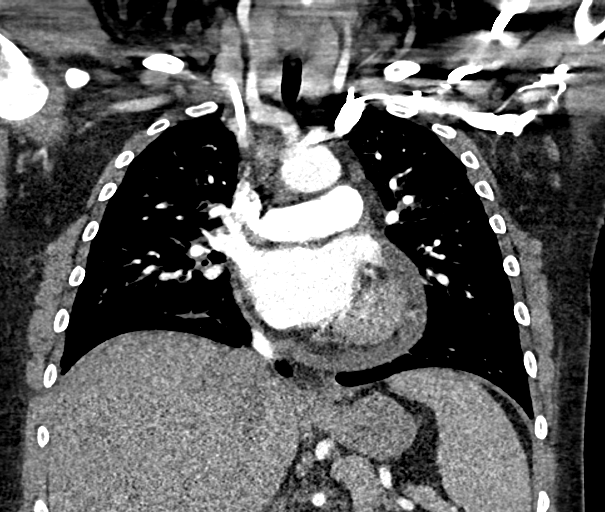

[18 of 36 positions shown; findings below may reference images not displayed]

RADIATION DOSE REDUCTION: This exam was performed according to the
departmental dose-optimization program which includes automated
exposure control, adjustment of the mA and/or kV according to
patient size and/or use of iterative reconstruction technique.

CONTRAST:  80mL OMNIPAQUE IOHEXOL 350 MG/ML SOLN
FINDINGS: Cardiovascular: Pulmonary arteries are well opacified. There is no
evidence of a pulmonary embolism.

Heart is normal in size. Small pericardial effusion. Normal great
vessels.

Mediastinum/Nodes: Visualized thyroid is unremarkable. Several
shotty right paratracheal mediastinal lymph nodes, largest an
azygous level node measuring 1.2 cm in short axis. No mediastinal or
hilar masses. Trachea and esophagus are unremarkable.

Lungs/Pleura: Bilateral interstitial thickening. This includes
bronchial wall thickening, the latter most evident in the lower
lobes. There are areas hazy increased attenuation adjacent to the
lower attenuation areas of the lungs, a mosaic pattern that is
nonspecific, but suggests small airways disease and or vascular
shunting. Trace bilateral pleural effusions. No pneumothorax.

Upper Abdomen: Visualized upper kidneys show cortical thinning.
Trace ascites.

Musculoskeletal: No fracture or acute finding.  No bone lesion.

Review of the MIP images confirms the above findings.
IMPRESSION: 1. No evidence of a pulmonary embolism.
2. Small pericardial effusion, trace pleural effusions and trace
ascites and bilateral interstitial thickening. Findings consistent
with pulmonary edema/anasarca. There is also bronchial wall
thickening and the subtle areas of ground-glass opacity in the
lungs, which create a mosaic type pattern, suspected to be from air
trapping small airways disease.

## 2022-11-24 IMAGING — DX DG CHEST 1V PORT
1 series · 2 of 2 positions shown · non-contrast
Comparison: 10/08/2021 and earlier.

CLINICAL DATA: 35-year-old female with chest pain and shortness of
breath. Dialysis patient.

EXAM:
PORTABLE CHEST 1 VIEW

[Series 1: chest · 0.14mm/px · 2 of 2 slices shown]
[im 1/2]
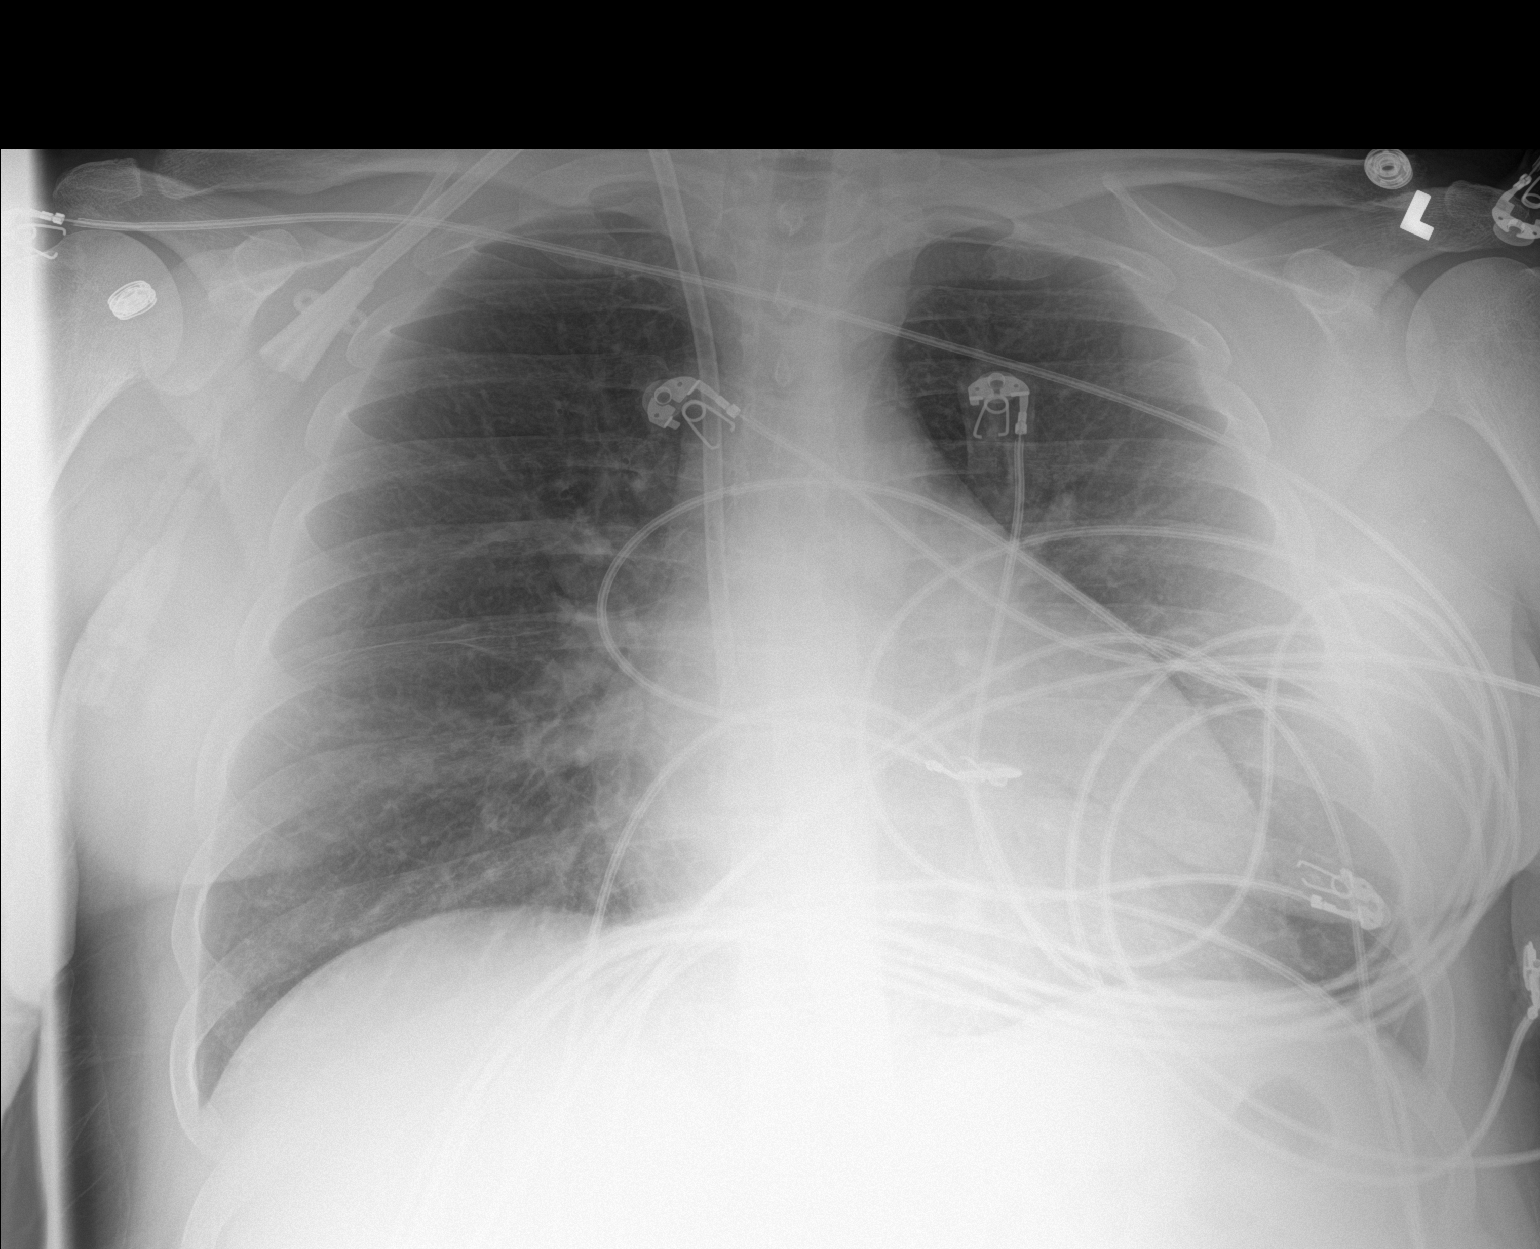
[im 2/2]
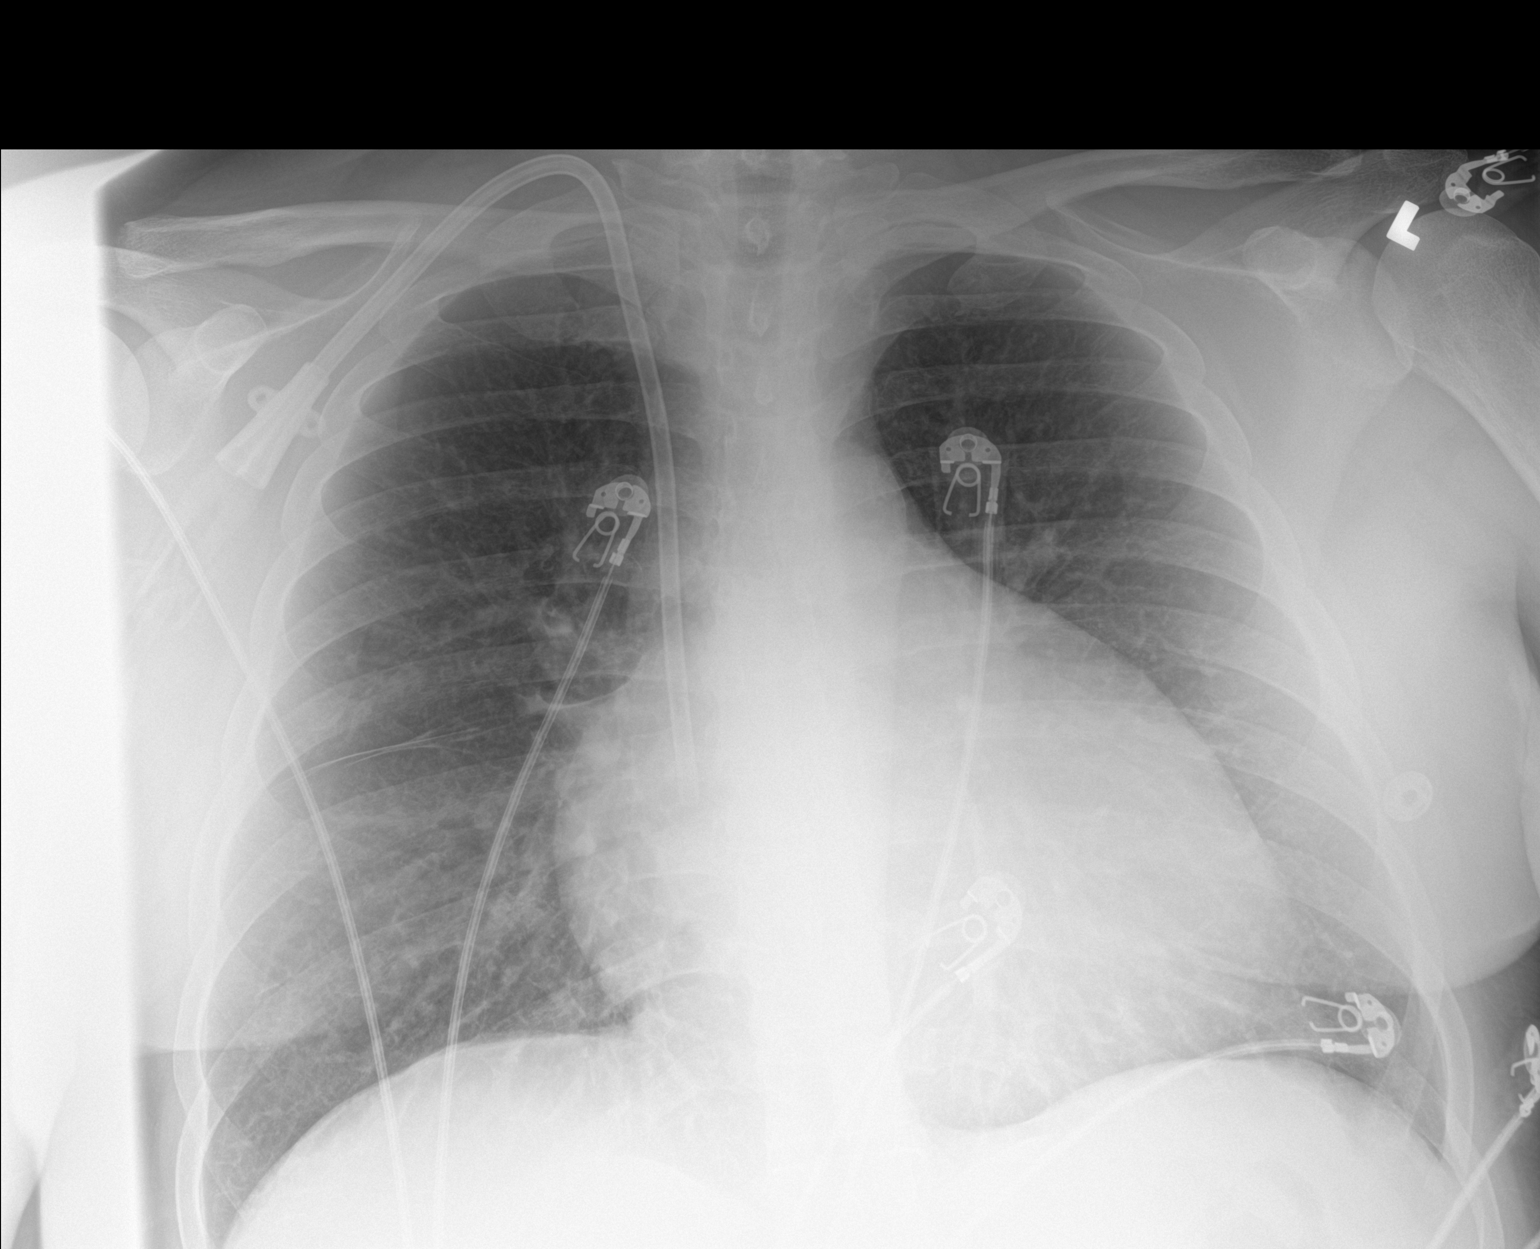

[2 of 2 positions shown; findings below may reference images not displayed]

FINDINGS: Portable AP semi upright view at 1111 hours. Stable right chest dual
lumen dialysis catheter. Mildly lordotic view. Lung volumes and
mediastinal contours are stable, cardiac size at the upper limits of
normal. Visualized tracheal air column is within normal limits.
Allowing for portable technique the lungs are clear. No pulmonary
edema or pleural effusion. No acute osseous abnormality identified.
IMPRESSION: No acute cardiopulmonary abnormality.

## 2022-11-25 DIAGNOSIS — E8779 Other fluid overload: Secondary | ICD-10-CM | POA: Diagnosis not present

## 2022-11-25 DIAGNOSIS — Z992 Dependence on renal dialysis: Secondary | ICD-10-CM | POA: Diagnosis not present

## 2022-11-25 DIAGNOSIS — N186 End stage renal disease: Secondary | ICD-10-CM | POA: Diagnosis not present

## 2022-11-26 DIAGNOSIS — E8779 Other fluid overload: Secondary | ICD-10-CM | POA: Diagnosis not present

## 2022-11-26 DIAGNOSIS — N186 End stage renal disease: Secondary | ICD-10-CM | POA: Diagnosis not present

## 2022-11-26 DIAGNOSIS — Z992 Dependence on renal dialysis: Secondary | ICD-10-CM | POA: Diagnosis not present

## 2022-11-27 ENCOUNTER — Encounter: Payer: Self-pay | Admitting: Physician Assistant

## 2022-11-27 ENCOUNTER — Ambulatory Visit: Payer: Medicare Other | Admitting: Family Medicine

## 2022-11-27 ENCOUNTER — Telehealth: Payer: Self-pay

## 2022-11-27 ENCOUNTER — Encounter: Payer: Self-pay | Admitting: Family Medicine

## 2022-11-27 VITALS — BP 142/84 | HR 95 | Temp 97.6°F | Ht 67.0 in | Wt 234.0 lb

## 2022-11-27 DIAGNOSIS — G8929 Other chronic pain: Secondary | ICD-10-CM | POA: Insufficient documentation

## 2022-11-27 DIAGNOSIS — N055 Unspecified nephritic syndrome with diffuse mesangiocapillary glomerulonephritis: Secondary | ICD-10-CM

## 2022-11-27 DIAGNOSIS — M25562 Pain in left knee: Secondary | ICD-10-CM

## 2022-11-27 DIAGNOSIS — M25551 Pain in right hip: Secondary | ICD-10-CM

## 2022-11-27 DIAGNOSIS — G894 Chronic pain syndrome: Secondary | ICD-10-CM

## 2022-11-27 DIAGNOSIS — M79641 Pain in right hand: Secondary | ICD-10-CM | POA: Diagnosis not present

## 2022-11-27 DIAGNOSIS — I10 Essential (primary) hypertension: Secondary | ICD-10-CM

## 2022-11-27 DIAGNOSIS — E039 Hypothyroidism, unspecified: Secondary | ICD-10-CM | POA: Diagnosis not present

## 2022-11-27 DIAGNOSIS — Z992 Dependence on renal dialysis: Secondary | ICD-10-CM | POA: Diagnosis not present

## 2022-11-27 DIAGNOSIS — F419 Anxiety disorder, unspecified: Secondary | ICD-10-CM

## 2022-11-27 DIAGNOSIS — M25561 Pain in right knee: Secondary | ICD-10-CM

## 2022-11-27 DIAGNOSIS — M79642 Pain in left hand: Secondary | ICD-10-CM

## 2022-11-27 DIAGNOSIS — N186 End stage renal disease: Secondary | ICD-10-CM

## 2022-11-27 DIAGNOSIS — M25552 Pain in left hip: Secondary | ICD-10-CM

## 2022-11-27 DIAGNOSIS — F32A Depression, unspecified: Secondary | ICD-10-CM

## 2022-11-27 DIAGNOSIS — G4733 Obstructive sleep apnea (adult) (pediatric): Secondary | ICD-10-CM

## 2022-11-27 DIAGNOSIS — E8779 Other fluid overload: Secondary | ICD-10-CM | POA: Diagnosis not present

## 2022-11-27 LAB — COMPREHENSIVE METABOLIC PANEL
ALT: 12 U/L (ref 0–35)
AST: 10 U/L (ref 0–37)
Albumin: 4.3 g/dL (ref 3.5–5.2)
Alkaline Phosphatase: 107 U/L (ref 39–117)
BUN: 58 mg/dL — ABNORMAL HIGH (ref 6–23)
CO2: 28 mEq/L (ref 19–32)
Calcium: 9.5 mg/dL (ref 8.4–10.5)
Chloride: 94 mEq/L — ABNORMAL LOW (ref 96–112)
Creatinine, Ser: 11.09 mg/dL (ref 0.40–1.20)
GFR: 4.09 mL/min — CL (ref >60.00)
Glucose, Bld: 111 mg/dL — ABNORMAL HIGH (ref 70–99)
Potassium: 4.4 mEq/L (ref 3.5–5.1)
Sodium: 134 mEq/L — ABNORMAL LOW (ref 135–145)
Total Bilirubin: 0.3 mg/dL (ref 0.2–1.2)
Total Protein: 6.9 g/dL (ref 6.0–8.3)

## 2022-11-27 LAB — CBC WITH DIFFERENTIAL/PLATELET
Basophils Absolute: 0.1 10*3/uL (ref 0.0–0.1)
Basophils Relative: 1 % (ref 0.0–3.0)
Eosinophils Absolute: 0.4 10*3/uL (ref 0.0–0.7)
Eosinophils Relative: 4.4 % (ref 0.0–5.0)
HCT: 21.4 % — CL (ref 36.0–46.0)
Hemoglobin: 7.5 g/dL — CL (ref 12.0–15.0)
Lymphocytes Relative: 14.8 % (ref 12.0–46.0)
Lymphs Abs: 1.2 10*3/uL (ref 0.7–4.0)
MCHC: 34.8 g/dL (ref 30.0–36.0)
MCV: 89.8 fl (ref 78.0–100.0)
Monocytes Absolute: 0.6 10*3/uL (ref 0.1–1.0)
Monocytes Relative: 7.2 % (ref 3.0–12.0)
Neutro Abs: 5.8 10*3/uL (ref 1.4–7.7)
Neutrophils Relative %: 72.6 % (ref 43.0–77.0)
Platelets: 207 10*3/uL (ref 150.0–400.0)
RBC: 2.39 Mil/uL — ABNORMAL LOW (ref 3.87–5.11)
RDW: 15.3 % (ref 11.5–15.5)
WBC: 8 10*3/uL (ref 4.0–10.5)

## 2022-11-27 LAB — TSH: TSH: 5.65 u[IU]/mL — ABNORMAL HIGH (ref 0.35–5.50)

## 2022-11-27 LAB — SEDIMENTATION RATE: Sed Rate: 7 mm/hr (ref 0–20)

## 2022-11-27 LAB — C-REACTIVE PROTEIN: CRP: <1 mg/dL (ref 0.5–20.0)

## 2022-11-27 NOTE — Assessment & Plan Note (Signed)
Screen for RF, inflammatory markers, ANA. Consider referral to rheumatologist or pain management pending labs.

## 2022-11-27 NOTE — Assessment & Plan Note (Signed)
Recently started using CPAP. Requesting compliance report.

## 2022-11-27 NOTE — Telephone Encounter (Signed)
CRITICAL VALUE STICKER  CRITICAL VALUE: creatinine 11.09   GFR 4.09  RECEIVER (on-site recipient of call):Holly [name]  DATE & TIME NOTIFIED: 11/27/22 4:05 pm  MESSENGER (representative from lab): Hope  MD NOTIFIED: vickie henson

## 2022-11-27 NOTE — Assessment & Plan Note (Signed)
Her nephrologist recommends referral to pain management. She cannot take NSAIDs due to ESRD. Tylenol is not helpful. She uses topical pain medication. Has been involved in PT and was seeing a chiropractor at one point before moving here.

## 2022-11-27 NOTE — Assessment & Plan Note (Signed)
Under the care of psychiatrist. Denies SI

## 2022-11-27 NOTE — Assessment & Plan Note (Signed)
Check inflammatory markers and RF. Refer to pain management if labs are negative.

## 2022-11-27 NOTE — Patient Instructions (Signed)
Please go downstairs for labs before you leave.    

## 2022-11-27 NOTE — Telephone Encounter (Signed)
CRITICAL VALUE STICKER  CRITICAL VALUE: hemoglobin 7.5 hematocrit 21.4  RECEIVER (on-site recipient of call): Jarrett Soho  DATE & TIME NOTIFIED: 11/27/22 3:29 pm  MESSENGER (representative from lab): karen  MD NOTIFIED: vickie henson

## 2022-11-27 NOTE — Telephone Encounter (Signed)
Called pt and relayed results as well as recommendations, pt verbalized understanding and will do that

## 2022-11-27 NOTE — Progress Notes (Signed)
Subjective:     Patient ID: Holly [name], female    DOB: 09-Sep-1985, 37 y.o.   MRN: [id number]  Chief Complaint  Patient presents with   Medical Management of Chronic Issues    HPI Patient is in today for multiple concerns.   OSA- using CPAP and has not noticed much benefit. Trouble sleeping in general.   Started on Lexapro. Prescribed by her psychiatrist, Dr. Adele Schilder.   Her nephrologist wants her to see pain management.  C/o pain in hands and arms all of the time. Chronic back pain. Bilateral hip pain with hx of hip dysplasia. Knee pain. States her hands swell.   Elevated inflammatory markers in past, years ago. She saw rheumatology at [facility] in [place]. States she was told her rheumatoid factor was high but she did not have lupus.  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  On dialysis 7 days per week for ESRD.   Tylenol does not help. Cannot take NSAIDs  She has done PT in past. She has been under the care of a chiropractor.   Neurologist referral was made for her toe numbness.   Hashimoto hx- taking levothyroxine  States her TSH is always between 7-200. She is happy with a TSH of 11   Denies SI. She wants to live. Feeling better about life this week.     Health Maintenance Due  Topic Date Due   Medicare Annual Wellness (AWV)  Never done   DTaP/Tdap/Td (1 - Tdap) Never done   PAP SMEAR-Modifier  Never done    Past Medical History:  Diagnosis Date   Anemia    Anxiety    Arthritis    hands   Blood transfusion without reported diagnosis    Cancer    Complication of anesthesia    with Fentanyl she is difficult to awake   Depression    Dialysis patient    Dyspnea    due to anemia   End stage renal disease    Epilepsy    Hashimoto's disease    History of kidney stones    HLD (hyperlipidemia)    Hypertension    Hypothyroidism    Membranoproliferative glomerulonephritis type 2, idiopathic    Pneumonia    Renal disorder    Thyroid disease     Past Surgical  History:  Procedure Laterality Date   A/V FISTULAGRAM Left 09/09/2022   Procedure: A/V Fistulagram;  Surgeon: Katha Cabal, MD;  Location: [place] [facility];  Service: Cardiovascular;  Laterality: Left;   ABDOMINAL HYSTERECTOMY     ABDOMINAL SURGERY     AV FISTULA PLACEMENT Left 01/17/2022   Procedure: ARTERIOVENOUS (AV) FISTULA CREATION ( RADIAL CEPHALIC);  Surgeon: Katha Cabal, MD;  Location: [facility];  Service: Vascular;  Laterality: Left;   CERVICAL BIOPSY  W/ LOOP ELECTRODE EXCISION  2018   COLONOSCOPY N/A 01/30/2022   Procedure: COLONOSCOPY;  Surgeon: Annamaria Helling, DO;  Location: [facility] ENDOSCOPY;  Service: Gastroenterology;  Laterality: N/A;   COLONOSCOPY WITH PROPOFOL N/A 02/01/2022   Procedure: COLONOSCOPY WITH PROPOFOL;  Surgeon: Lesly Rubenstein, MD;  Location: [facility] ENDOSCOPY;  Service: Endoscopy;  Laterality: N/A;   DIAGNOSTIC LAPAROSCOPY     due to removal of PD catheters   DIALYSIS/PERMA CATHETER INSERTION N/A 06/09/2022   Procedure: DIALYSIS/PERMA CATHETER INSERTION;  Surgeon: Algernon Huxley, MD;  Location: [place] [facility];  Service: Cardiovascular;  Laterality: N/A;   DIALYSIS/PERMA CATHETER INSERTION N/A 06/30/2022   Procedure: DIALYSIS/PERMA CATHETER INSERTION;  Surgeon: Marty Heck, MD;  Location: [place] [facility];  Service: Cardiovascular;  Laterality: N/A;   ESOPHAGOGASTRODUODENOSCOPY N/A 01/30/2022   Procedure: ESOPHAGOGASTRODUODENOSCOPY (EGD);  Surgeon: Annamaria Helling, DO;  Location: [facility] ENDOSCOPY;  Service: Gastroenterology;  Laterality: N/A;   ESOPHAGOGASTRODUODENOSCOPY (EGD) WITH PROPOFOL N/A 02/01/2022   Procedure: ESOPHAGOGASTRODUODENOSCOPY (EGD) WITH PROPOFOL;  Surgeon: Lesly Rubenstein, MD;  Location: [facility] ENDOSCOPY;  Service: Endoscopy;  Laterality: N/A;   RENAL BIOPSY     x 2   TUMOR REMOVAL     right face   UTERINE ARTERY EMBOLIZATION  05/04/2022    Family History  Problem Relation Age of Onset    Multiple sclerosis Mother    Cancer Father    COPD Father     Social History   Socioeconomic History   Marital status: Significant Other    Spouse name: [name]   Number of children: Not on file   Years of education: Not on file   Highest education level: Associate degree: occupational, Hotel manager, or vocational program  Occupational History   Not on file  Tobacco Use   Smoking status: Former    Types: Cigarettes    Quit date: 2023    Years since quitting: 1.2   Smokeless tobacco: Never  Vaping Use   Vaping Use: Never used  Substance and Sexual Activity   Alcohol use: Never   Drug use: Never   Sexual activity: Not Currently  Other Topics Concern   Not on file  Social History Narrative   Lives with [name], significant other. One pet, cat.    Social Determinants of Health   Financial Resource Strain: Medium Risk (11/26/2022)   Overall Financial Resource Strain (CARDIA)    Difficulty of Paying Living Expenses: Somewhat hard  Food Insecurity: Food Insecurity Present (11/26/2022)   Hunger Vital Sign    Worried About Running Out of Food in the Last Year: Sometimes true    Ran Out of Food in the Last Year: Never true  Transportation Needs: Unmet Transportation Needs (11/26/2022)   PRAPARE - Transportation    Lack of Transportation (Medical): No    Lack of Transportation (Non-Medical): Yes  Physical Activity: Insufficiently Active (11/26/2022)   Exercise Vital Sign    Days of Exercise per Week: 3 days    Minutes of Exercise per Session: 10 min  Stress: Stress Concern Present (11/26/2022)   [place]    Feeling of Stress : Very much  Social Connections: Socially Isolated (11/26/2022)   Social Connection and Isolation Panel [NHANES]    Frequency of Communication with Friends and Family: Three times a week    Frequency of Social Gatherings with Friends and Family: Once a week    Attends Religious Services: Never     Marine scientist or Organizations: No    Attends Music therapist: Not on file    Marital Status: Divorced  Human resources officer Violence: Not on file    Outpatient Medications Prior to Visit  Medication Sig Dispense Refill   acyclovir (ZOVIRAX) 200 MG capsule Take 200 mg by mouth 2 (two) times daily as needed (out breaks).     albuterol (VENTOLIN HFA) 108 (90 Base) MCG/ACT inhaler Inhale 2-4 puffs into the lungs every 4 (four) hours as needed for wheezing (or cough). 18 g 0   amLODipine (NORVASC) 10 MG tablet Take 1 tablet (10 mg total) by mouth daily. 30 tablet 0   carvedilol (COREG) 12.5 MG  tablet Take 12.5 mg by mouth 2 (two) times daily with a meal.     cetirizine (ZYRTEC) 10 MG tablet Take 10 mg by mouth at bedtime.     cinacalcet (SENSIPAR) 60 MG tablet Take 60 mg by mouth at bedtime.     epoetin alfa (EPOGEN) 10000 UNIT/ML injection Inject 10,000 Units into the skin once a week. saturdays     epoetin alfa (EPOGEN) 20000 UNIT/ML injection Inject 20,000 Units into the skin once a week. Saturdays     escitalopram (LEXAPRO) 10 MG tablet Take 1/2 tab tab daily for one week and than full tab daily 30 tablet 0   FOSRENOL 1000 MG chewable tablet Chew 1,000 mg by mouth 3 (three) times daily.     gabapentin (NEURONTIN) 100 MG capsule Take 100 mg by mouth 3 (three) times daily.     levothyroxine (SYNTHROID) 200 MCG tablet Take 200 mcg by mouth daily before breakfast.     ondansetron (ZOFRAN-ODT) 4 MG disintegrating tablet Take 4 mg by mouth every 8 (eight) hours as needed for nausea or vomiting.     diclofenac Sodium (VOLTAREN) 1 % GEL Apply 1 application  topically 4 (four) times daily as needed (hand,feet,thighs and back pain). (Patient not taking: Reported on 08/27/2022)     cefdinir (OMNICEF) 125 MG/5ML suspension Take 5 mLs (125 mg total) by mouth 2 (two) times daily. (Patient not taking: Reported on 09/09/2022) 100 mL 0   VELPHORO 500 MG chewable tablet Chew 500 mg by mouth 3  (three) times daily with meals.     No facility-administered medications prior to visit.    Allergies  Allergen Reactions   Ciprofloxacin Anaphylaxis and Other (See Comments)    Caused Seizures   Seizures   Other reaction(s): Other (See Comments)  Seizures  Seizures     Caused Seizures  Seizures  Seizures  Other reaction(s): Other (See Comments) Seizures  Seizures  Seizures     Other reaction(s): Other (See Comments) Seizures  Seizures  Seizures     Caused Seizures  Caused Seizures  Seizures  Seizures  Other reaction(s): Other (See Comments) Seizures  Seizures  Seizures    Seizures Grand mal   Rituximab Anaphylaxis and Swelling    Review of Systems  Constitutional:  Positive for malaise/fatigue. Negative for chills, fever and weight loss.  Respiratory:  Negative for shortness of breath.   Cardiovascular:  Negative for chest pain, palpitations and leg swelling.  Gastrointestinal:  Negative for abdominal pain, constipation, diarrhea, nausea and vomiting.  Genitourinary:  Negative for dysuria, frequency and urgency.  Musculoskeletal:  Positive for back pain and joint pain.  Skin:  Negative for rash.  Neurological:  Negative for dizziness and focal weakness.  Psychiatric/Behavioral:  Positive for depression. Negative for substance abuse and suicidal ideas. The patient has insomnia.        Objective:    Physical Exam Constitutional:      General: She is not in acute distress.    Appearance: She is not ill-appearing.  Eyes:     Extraocular Movements: Extraocular movements intact.     Conjunctiva/sclera: Conjunctivae normal.  Cardiovascular:     Rate and Rhythm: Normal rate.  Pulmonary:     Effort: Pulmonary effort is normal.  Musculoskeletal:     Cervical back: Normal range of motion and neck supple.  Skin:    General: Skin is warm and dry.  Neurological:     General: No focal deficit present.     Mental Status:  She is alert and oriented to person, place, and  time.  Psychiatric:        Mood and Affect: Mood normal.        Behavior: Behavior normal.        Thought Content: Thought content normal.     BP (!) 142/84 (BP Location: Left Arm, Patient Position: Sitting, Cuff Size: Large)   Pulse 95   Temp 97.6 F (36.4 C) (Temporal)   Ht 5\' 7"  (1.702 m)   Wt 234 lb (106.1 kg)   LMP 03/31/2022 (Exact Date) Comment: pt bleeding last 6 weeks with recent multiple transfusions  SpO2 100%   BMI 36.65 kg/m  Wt Readings from Last 3 Encounters:  11/27/22 234 lb (106.1 kg)  11/20/22 218 lb (98.9 kg)  10/21/22 218 lb (98.9 kg)       Assessment & Plan:   Problem List Items Addressed This Visit       Cardiovascular and Mediastinum   Essential hypertension (Chronic)    BP fluctuates. Elevated here today. Continue close monitoring at home. Continue current medications. Followed closely by nephrologist.       Relevant Orders   CBC with Differential/Platelet (Completed)   Comprehensive metabolic panel (Completed)     Respiratory   OSA on CPAP    Recently started using CPAP. Requesting compliance report.         Endocrine   Hypothyroidism - Primary (Chronic)    Continue current dose of levothyroxine. Check TSH and follow up      Relevant Orders   TSH (Completed)     Genitourinary   ESRD on dialysis (Chronic)   Relevant Orders   CBC with Differential/Platelet (Completed)   Comprehensive metabolic panel (Completed)   MPGN (membranoproliferative glomerulonephritis), type 1     Other   Anxiety and depression    Under the care of psychiatrist. Denies SI      Bilateral hand pain    Screen for RF, inflammatory markers, ANA. Consider referral to rheumatologist or pain management pending labs.       Relevant Orders   Sedimentation rate (Completed)   C-reactive protein (Completed)   ANA   Rheumatoid factor   Chronic arthralgias of knees and hips    Check inflammatory markers and RF. Refer to pain management if labs are negative.        Relevant Orders   Sedimentation rate (Completed)   C-reactive protein (Completed)   ANA   Rheumatoid factor   Chronic pain syndrome    Her nephrologist recommends referral to pain management. She cannot take NSAIDs due to ESRD. Tylenol is not helpful. She uses topical pain medication. Has been involved in PT and was seeing a chiropractor at one point before moving here.       Relevant Orders   Sedimentation rate (Completed)   C-reactive protein (Completed)   ANA   Rheumatoid factor    I have discontinued [place] Velphoro and cefdinir. I am also having her maintain her carvedilol, cinacalcet, levothyroxine, acyclovir, diclofenac Sodium, gabapentin, amLODipine, ondansetron, cetirizine, albuterol, epoetin alfa, epoetin alfa, escitalopram, and Fosrenol.  No orders of the defined types were placed in this encounter.

## 2022-11-27 NOTE — Assessment & Plan Note (Signed)
Continue current dose of levothyroxine. Check TSH and follow up

## 2022-11-27 NOTE — Assessment & Plan Note (Signed)
BP fluctuates. Elevated here today. Continue close monitoring at home. Continue current medications. Followed closely by nephrologist.

## 2022-11-28 DIAGNOSIS — E8779 Other fluid overload: Secondary | ICD-10-CM | POA: Diagnosis not present

## 2022-11-28 DIAGNOSIS — N186 End stage renal disease: Secondary | ICD-10-CM | POA: Diagnosis not present

## 2022-11-28 DIAGNOSIS — Z992 Dependence on renal dialysis: Secondary | ICD-10-CM | POA: Diagnosis not present

## 2022-11-28 LAB — ANA: Anti Nuclear Antibody (ANA): NEGATIVE

## 2022-11-28 LAB — RHEUMATOID FACTOR: Rheumatoid fact SerPl-aCnc: <14 IU/mL (ref <14)

## 2022-11-29 DIAGNOSIS — Z992 Dependence on renal dialysis: Secondary | ICD-10-CM | POA: Diagnosis not present

## 2022-11-29 DIAGNOSIS — E8779 Other fluid overload: Secondary | ICD-10-CM | POA: Diagnosis not present

## 2022-11-29 DIAGNOSIS — N186 End stage renal disease: Secondary | ICD-10-CM | POA: Diagnosis not present

## 2022-11-30 DIAGNOSIS — N83202 Unspecified ovarian cyst, left side: Secondary | ICD-10-CM | POA: Diagnosis not present

## 2022-11-30 DIAGNOSIS — N83201 Unspecified ovarian cyst, right side: Secondary | ICD-10-CM | POA: Diagnosis not present

## 2022-11-30 DIAGNOSIS — Z992 Dependence on renal dialysis: Secondary | ICD-10-CM | POA: Diagnosis not present

## 2022-11-30 DIAGNOSIS — E8779 Other fluid overload: Secondary | ICD-10-CM | POA: Diagnosis not present

## 2022-11-30 DIAGNOSIS — N186 End stage renal disease: Secondary | ICD-10-CM | POA: Diagnosis not present

## 2022-12-01 ENCOUNTER — Telehealth: Payer: Self-pay | Admitting: Family Medicine

## 2022-12-01 DIAGNOSIS — E8779 Other fluid overload: Secondary | ICD-10-CM | POA: Diagnosis not present

## 2022-12-01 DIAGNOSIS — N186 End stage renal disease: Secondary | ICD-10-CM | POA: Diagnosis not present

## 2022-12-01 DIAGNOSIS — Z992 Dependence on renal dialysis: Secondary | ICD-10-CM | POA: Diagnosis not present

## 2022-12-01 NOTE — Telephone Encounter (Signed)
Patient would like a call back to discuss recent lab results. Best call back is [phone].

## 2022-12-01 NOTE — Telephone Encounter (Signed)
Pt saw that her rheumatoid factor lab result came back and resulted as <14 and is wondering if that is a yes to a rheumatologist? I advised PCP is out of office until Wednesday and she is ok with waiting for a response.

## 2022-12-02 DIAGNOSIS — N186 End stage renal disease: Secondary | ICD-10-CM | POA: Diagnosis not present

## 2022-12-02 DIAGNOSIS — E8779 Other fluid overload: Secondary | ICD-10-CM | POA: Diagnosis not present

## 2022-12-02 DIAGNOSIS — Z992 Dependence on renal dialysis: Secondary | ICD-10-CM | POA: Diagnosis not present

## 2022-12-03 DIAGNOSIS — E8779 Other fluid overload: Secondary | ICD-10-CM | POA: Diagnosis not present

## 2022-12-03 DIAGNOSIS — Z992 Dependence on renal dialysis: Secondary | ICD-10-CM | POA: Diagnosis not present

## 2022-12-03 DIAGNOSIS — N186 End stage renal disease: Secondary | ICD-10-CM | POA: Diagnosis not present

## 2022-12-03 NOTE — Telephone Encounter (Signed)
Called pt and relayed Vickie's message, pt reports she would like to see vickie so she can document and make sure she is able to get in to pain management. Also states we can do Ortho referral then as well. Appt scheduled.

## 2022-12-04 ENCOUNTER — Telehealth: Payer: Self-pay | Admitting: Family Medicine

## 2022-12-04 MED ORDER — ALBUTEROL SULFATE HFA 108 (90 BASE) MCG/ACT IN AERS: 2.0000 | INHALATION_SPRAY | RESPIRATORY_TRACT | 0 refills | Status: DC | PRN

## 2022-12-04 NOTE — Telephone Encounter (Signed)
Prescription Request  12/04/2022  LOV: 11/27/2022  What is the name of the medication or equipment? albuterol (VENTOLIN HFA) 108 (90 Base) MCG/ACT inhaler   Have you contacted your pharmacy to request a refill? No   Which pharmacy would you like this sent to?  [facility] [address], [place]    Patient notified that their request is being sent to the clinical staff for review and that they should receive a response within 2 business days.   Please advise at Mobile [phone] (mobile)    Patient is currently out of town and needs one inhaler sent to the above location.

## 2022-12-04 NOTE — Telephone Encounter (Signed)
[facility] [place].Marland Kitchen/LMB

## 2022-12-05 DIAGNOSIS — E8779 Other fluid overload: Secondary | ICD-10-CM | POA: Diagnosis not present

## 2022-12-05 DIAGNOSIS — Z992 Dependence on renal dialysis: Secondary | ICD-10-CM | POA: Diagnosis not present

## 2022-12-05 DIAGNOSIS — N186 End stage renal disease: Secondary | ICD-10-CM | POA: Diagnosis not present

## 2022-12-07 DIAGNOSIS — N186 End stage renal disease: Secondary | ICD-10-CM | POA: Diagnosis not present

## 2022-12-07 DIAGNOSIS — E8779 Other fluid overload: Secondary | ICD-10-CM | POA: Diagnosis not present

## 2022-12-07 DIAGNOSIS — Z992 Dependence on renal dialysis: Secondary | ICD-10-CM | POA: Diagnosis not present

## 2022-12-08 DIAGNOSIS — Z992 Dependence on renal dialysis: Secondary | ICD-10-CM | POA: Diagnosis not present

## 2022-12-08 DIAGNOSIS — N186 End stage renal disease: Secondary | ICD-10-CM | POA: Diagnosis not present

## 2022-12-08 DIAGNOSIS — E8779 Other fluid overload: Secondary | ICD-10-CM | POA: Diagnosis not present

## 2022-12-10 DIAGNOSIS — Z992 Dependence on renal dialysis: Secondary | ICD-10-CM | POA: Diagnosis not present

## 2022-12-10 DIAGNOSIS — N186 End stage renal disease: Secondary | ICD-10-CM | POA: Diagnosis not present

## 2022-12-10 DIAGNOSIS — E8779 Other fluid overload: Secondary | ICD-10-CM | POA: Diagnosis not present

## 2022-12-11 ENCOUNTER — Telehealth (INDEPENDENT_AMBULATORY_CARE_PROVIDER_SITE_OTHER): Payer: Self-pay

## 2022-12-11 DIAGNOSIS — N186 End stage renal disease: Secondary | ICD-10-CM | POA: Diagnosis not present

## 2022-12-11 DIAGNOSIS — E8779 Other fluid overload: Secondary | ICD-10-CM | POA: Diagnosis not present

## 2022-12-11 DIAGNOSIS — Z992 Dependence on renal dialysis: Secondary | ICD-10-CM | POA: Diagnosis not present

## 2022-12-11 NOTE — Telephone Encounter (Signed)
Holly [name] called from Dr. Anola Gurney office for a long term disability request with any current medical conditions that may support the disability report, as well as any objective findings, as well if she's disable or not.   Please advise

## 2022-12-12 ENCOUNTER — Encounter: Payer: Self-pay | Admitting: Physician Assistant

## 2022-12-12 DIAGNOSIS — N186 End stage renal disease: Secondary | ICD-10-CM | POA: Diagnosis not present

## 2022-12-12 DIAGNOSIS — E8779 Other fluid overload: Secondary | ICD-10-CM | POA: Diagnosis not present

## 2022-12-12 DIAGNOSIS — Z992 Dependence on renal dialysis: Secondary | ICD-10-CM | POA: Diagnosis not present

## 2022-12-12 NOTE — Telephone Encounter (Signed)
We follow the patient for her AV fistula for dialysis access.  She end stage renal disease, but she able to do home dialysis herself (at last we saw her).  We also haven't seen the patient in about 3 months so it's difficult to say if she's had any symptom decline.  The last we saw her she was still working I believe

## 2022-12-12 NOTE — Telephone Encounter (Signed)
I called the number that was given to speak to Caryn Bee (the guy requesting the info) and it's Dr. Anola Gurney direct line. I'm unsure what the next step is what to do. Please advise

## 2022-12-12 NOTE — Telephone Encounter (Signed)
I would given the information that I noted above.  If they have any additional questions they can reach out to [place] for any further clarifications.

## 2022-12-13 DIAGNOSIS — E8779 Other fluid overload: Secondary | ICD-10-CM | POA: Diagnosis not present

## 2022-12-13 DIAGNOSIS — Z992 Dependence on renal dialysis: Secondary | ICD-10-CM | POA: Diagnosis not present

## 2022-12-13 DIAGNOSIS — N186 End stage renal disease: Secondary | ICD-10-CM | POA: Diagnosis not present

## 2022-12-14 DIAGNOSIS — E8779 Other fluid overload: Secondary | ICD-10-CM | POA: Diagnosis not present

## 2022-12-14 DIAGNOSIS — N186 End stage renal disease: Secondary | ICD-10-CM | POA: Diagnosis not present

## 2022-12-14 DIAGNOSIS — Z992 Dependence on renal dialysis: Secondary | ICD-10-CM | POA: Diagnosis not present

## 2022-12-15 ENCOUNTER — Telehealth (HOSPITAL_COMMUNITY): Payer: BC Managed Care – PPO | Admitting: Psychiatry

## 2022-12-15 DIAGNOSIS — Z992 Dependence on renal dialysis: Secondary | ICD-10-CM | POA: Diagnosis not present

## 2022-12-15 DIAGNOSIS — N186 End stage renal disease: Secondary | ICD-10-CM | POA: Diagnosis not present

## 2022-12-15 DIAGNOSIS — E8779 Other fluid overload: Secondary | ICD-10-CM | POA: Diagnosis not present

## 2022-12-16 ENCOUNTER — Ambulatory Visit: Payer: BC Managed Care – PPO | Admitting: Family Medicine

## 2022-12-16 DIAGNOSIS — Z992 Dependence on renal dialysis: Secondary | ICD-10-CM | POA: Diagnosis not present

## 2022-12-16 DIAGNOSIS — N186 End stage renal disease: Secondary | ICD-10-CM | POA: Diagnosis not present

## 2022-12-16 DIAGNOSIS — E8779 Other fluid overload: Secondary | ICD-10-CM | POA: Diagnosis not present

## 2022-12-17 DIAGNOSIS — Z992 Dependence on renal dialysis: Secondary | ICD-10-CM | POA: Diagnosis not present

## 2022-12-17 DIAGNOSIS — N186 End stage renal disease: Secondary | ICD-10-CM | POA: Diagnosis not present

## 2022-12-17 DIAGNOSIS — E8779 Other fluid overload: Secondary | ICD-10-CM | POA: Diagnosis not present

## 2022-12-19 DIAGNOSIS — N186 End stage renal disease: Secondary | ICD-10-CM | POA: Diagnosis not present

## 2022-12-19 DIAGNOSIS — Z992 Dependence on renal dialysis: Secondary | ICD-10-CM | POA: Diagnosis not present

## 2022-12-19 DIAGNOSIS — E8779 Other fluid overload: Secondary | ICD-10-CM | POA: Diagnosis not present

## 2022-12-20 DIAGNOSIS — N186 End stage renal disease: Secondary | ICD-10-CM | POA: Diagnosis not present

## 2022-12-20 DIAGNOSIS — Z992 Dependence on renal dialysis: Secondary | ICD-10-CM | POA: Diagnosis not present

## 2022-12-20 DIAGNOSIS — E8779 Other fluid overload: Secondary | ICD-10-CM | POA: Diagnosis not present

## 2022-12-21 DIAGNOSIS — N186 End stage renal disease: Secondary | ICD-10-CM | POA: Diagnosis not present

## 2022-12-21 DIAGNOSIS — E8779 Other fluid overload: Secondary | ICD-10-CM | POA: Diagnosis not present

## 2022-12-21 DIAGNOSIS — Z992 Dependence on renal dialysis: Secondary | ICD-10-CM | POA: Diagnosis not present

## 2022-12-22 DIAGNOSIS — E8779 Other fluid overload: Secondary | ICD-10-CM | POA: Diagnosis not present

## 2022-12-22 DIAGNOSIS — Z992 Dependence on renal dialysis: Secondary | ICD-10-CM | POA: Diagnosis not present

## 2022-12-22 DIAGNOSIS — N186 End stage renal disease: Secondary | ICD-10-CM | POA: Diagnosis not present

## 2022-12-24 DIAGNOSIS — N186 End stage renal disease: Secondary | ICD-10-CM | POA: Diagnosis not present

## 2022-12-24 DIAGNOSIS — E8779 Other fluid overload: Secondary | ICD-10-CM | POA: Diagnosis not present

## 2022-12-24 DIAGNOSIS — Z992 Dependence on renal dialysis: Secondary | ICD-10-CM | POA: Diagnosis not present

## 2022-12-24 NOTE — Telephone Encounter (Signed)
Error

## 2022-12-25 DIAGNOSIS — Z992 Dependence on renal dialysis: Secondary | ICD-10-CM | POA: Diagnosis not present

## 2022-12-25 DIAGNOSIS — N186 End stage renal disease: Secondary | ICD-10-CM | POA: Diagnosis not present

## 2022-12-25 DIAGNOSIS — E8779 Other fluid overload: Secondary | ICD-10-CM | POA: Diagnosis not present

## 2022-12-26 ENCOUNTER — Other Ambulatory Visit (HOSPITAL_COMMUNITY): Payer: Self-pay | Admitting: Psychiatry

## 2022-12-26 DIAGNOSIS — Z992 Dependence on renal dialysis: Secondary | ICD-10-CM | POA: Diagnosis not present

## 2022-12-26 DIAGNOSIS — F411 Generalized anxiety disorder: Secondary | ICD-10-CM

## 2022-12-26 DIAGNOSIS — F4312 Post-traumatic stress disorder, chronic: Secondary | ICD-10-CM

## 2022-12-26 DIAGNOSIS — E8779 Other fluid overload: Secondary | ICD-10-CM | POA: Diagnosis not present

## 2022-12-26 DIAGNOSIS — N186 End stage renal disease: Secondary | ICD-10-CM | POA: Diagnosis not present

## 2022-12-26 DIAGNOSIS — F331 Major depressive disorder, recurrent, moderate: Secondary | ICD-10-CM

## 2022-12-29 DIAGNOSIS — E8779 Other fluid overload: Secondary | ICD-10-CM | POA: Diagnosis not present

## 2022-12-29 DIAGNOSIS — Z992 Dependence on renal dialysis: Secondary | ICD-10-CM | POA: Diagnosis not present

## 2022-12-29 DIAGNOSIS — N186 End stage renal disease: Secondary | ICD-10-CM | POA: Diagnosis not present

## 2022-12-30 DIAGNOSIS — Z992 Dependence on renal dialysis: Secondary | ICD-10-CM | POA: Diagnosis not present

## 2022-12-30 DIAGNOSIS — N186 End stage renal disease: Secondary | ICD-10-CM | POA: Diagnosis not present

## 2022-12-30 DIAGNOSIS — E8779 Other fluid overload: Secondary | ICD-10-CM | POA: Diagnosis not present

## 2022-12-31 DIAGNOSIS — Z992 Dependence on renal dialysis: Secondary | ICD-10-CM | POA: Diagnosis not present

## 2022-12-31 DIAGNOSIS — E8779 Other fluid overload: Secondary | ICD-10-CM | POA: Diagnosis not present

## 2022-12-31 DIAGNOSIS — N186 End stage renal disease: Secondary | ICD-10-CM | POA: Diagnosis not present

## 2023-01-01 DIAGNOSIS — Z992 Dependence on renal dialysis: Secondary | ICD-10-CM | POA: Diagnosis not present

## 2023-01-01 DIAGNOSIS — E8779 Other fluid overload: Secondary | ICD-10-CM | POA: Diagnosis not present

## 2023-01-01 DIAGNOSIS — N186 End stage renal disease: Secondary | ICD-10-CM | POA: Diagnosis not present

## 2023-01-02 DIAGNOSIS — N186 End stage renal disease: Secondary | ICD-10-CM | POA: Diagnosis not present

## 2023-01-02 DIAGNOSIS — Z992 Dependence on renal dialysis: Secondary | ICD-10-CM | POA: Diagnosis not present

## 2023-01-02 DIAGNOSIS — E8779 Other fluid overload: Secondary | ICD-10-CM | POA: Diagnosis not present

## 2023-01-04 DIAGNOSIS — Z992 Dependence on renal dialysis: Secondary | ICD-10-CM | POA: Diagnosis not present

## 2023-01-04 DIAGNOSIS — N186 End stage renal disease: Secondary | ICD-10-CM | POA: Diagnosis not present

## 2023-01-04 DIAGNOSIS — E8779 Other fluid overload: Secondary | ICD-10-CM | POA: Diagnosis not present

## 2023-01-06 DIAGNOSIS — N186 End stage renal disease: Secondary | ICD-10-CM | POA: Diagnosis not present

## 2023-01-06 DIAGNOSIS — Z992 Dependence on renal dialysis: Secondary | ICD-10-CM | POA: Diagnosis not present

## 2023-01-06 DIAGNOSIS — E8779 Other fluid overload: Secondary | ICD-10-CM | POA: Diagnosis not present

## 2023-01-07 DIAGNOSIS — Z992 Dependence on renal dialysis: Secondary | ICD-10-CM | POA: Diagnosis not present

## 2023-01-07 DIAGNOSIS — N186 End stage renal disease: Secondary | ICD-10-CM | POA: Diagnosis not present

## 2023-01-07 DIAGNOSIS — E8779 Other fluid overload: Secondary | ICD-10-CM | POA: Diagnosis not present

## 2023-01-08 DIAGNOSIS — E8779 Other fluid overload: Secondary | ICD-10-CM | POA: Diagnosis not present

## 2023-01-08 DIAGNOSIS — N186 End stage renal disease: Secondary | ICD-10-CM | POA: Diagnosis not present

## 2023-01-08 DIAGNOSIS — Z992 Dependence on renal dialysis: Secondary | ICD-10-CM | POA: Diagnosis not present

## 2023-01-09 DIAGNOSIS — N186 End stage renal disease: Secondary | ICD-10-CM | POA: Diagnosis not present

## 2023-01-09 DIAGNOSIS — E8779 Other fluid overload: Secondary | ICD-10-CM | POA: Diagnosis not present

## 2023-01-09 DIAGNOSIS — Z992 Dependence on renal dialysis: Secondary | ICD-10-CM | POA: Diagnosis not present

## 2023-01-11 DIAGNOSIS — E8779 Other fluid overload: Secondary | ICD-10-CM | POA: Diagnosis not present

## 2023-01-11 DIAGNOSIS — N186 End stage renal disease: Secondary | ICD-10-CM | POA: Diagnosis not present

## 2023-01-11 DIAGNOSIS — Z992 Dependence on renal dialysis: Secondary | ICD-10-CM | POA: Diagnosis not present

## 2023-01-13 ENCOUNTER — Encounter: Payer: Self-pay | Admitting: Physician Assistant

## 2023-01-13 DIAGNOSIS — E8779 Other fluid overload: Secondary | ICD-10-CM | POA: Diagnosis not present

## 2023-01-13 DIAGNOSIS — Z992 Dependence on renal dialysis: Secondary | ICD-10-CM | POA: Diagnosis not present

## 2023-01-13 DIAGNOSIS — N186 End stage renal disease: Secondary | ICD-10-CM | POA: Diagnosis not present

## 2023-01-14 DIAGNOSIS — E8779 Other fluid overload: Secondary | ICD-10-CM | POA: Diagnosis not present

## 2023-01-14 DIAGNOSIS — N186 End stage renal disease: Secondary | ICD-10-CM | POA: Diagnosis not present

## 2023-01-14 DIAGNOSIS — Z992 Dependence on renal dialysis: Secondary | ICD-10-CM | POA: Diagnosis not present

## 2023-01-15 DIAGNOSIS — Z992 Dependence on renal dialysis: Secondary | ICD-10-CM | POA: Diagnosis not present

## 2023-01-15 DIAGNOSIS — E8779 Other fluid overload: Secondary | ICD-10-CM | POA: Diagnosis not present

## 2023-01-15 DIAGNOSIS — N186 End stage renal disease: Secondary | ICD-10-CM | POA: Diagnosis not present

## 2023-01-17 DIAGNOSIS — E8779 Other fluid overload: Secondary | ICD-10-CM | POA: Diagnosis not present

## 2023-01-17 DIAGNOSIS — Z992 Dependence on renal dialysis: Secondary | ICD-10-CM | POA: Diagnosis not present

## 2023-01-17 DIAGNOSIS — N186 End stage renal disease: Secondary | ICD-10-CM | POA: Diagnosis not present

## 2023-01-19 ENCOUNTER — Other Ambulatory Visit (HOSPITAL_COMMUNITY): Payer: Self-pay

## 2023-01-19 ENCOUNTER — Encounter: Payer: Self-pay | Admitting: Physician Assistant

## 2023-01-19 ENCOUNTER — Telehealth: Payer: Self-pay

## 2023-01-19 DIAGNOSIS — N186 End stage renal disease: Secondary | ICD-10-CM | POA: Diagnosis not present

## 2023-01-19 DIAGNOSIS — Z992 Dependence on renal dialysis: Secondary | ICD-10-CM | POA: Diagnosis not present

## 2023-01-19 DIAGNOSIS — E8779 Other fluid overload: Secondary | ICD-10-CM | POA: Diagnosis not present

## 2023-01-19 NOTE — Telephone Encounter (Signed)
Pharmacy Patient Advocate Encounter  Received notification from [facility] that the request for prior authorization for Albuterol Sulfate HFA 108 (90 Base)MCG/ACT aerosol has been denied   RAN TEST CLAIM AND GOT A PAID CLAIM  COST OF 0.00. I TRIED CALLING AND PHARMACY WAS CLOSED TODAY TO LET THE KNOW WHICH ONE TO RUN THROUGH AND TO RUN ONE 8.5G INHALER FOR 30 DAY SUPPLY AND IT NEEDS TO BE THE 8.5 G INHALER.  PLEASE BE ADVISED

## 2023-01-20 DIAGNOSIS — N186 End stage renal disease: Secondary | ICD-10-CM | POA: Diagnosis not present

## 2023-01-20 DIAGNOSIS — E8779 Other fluid overload: Secondary | ICD-10-CM | POA: Diagnosis not present

## 2023-01-20 DIAGNOSIS — Z992 Dependence on renal dialysis: Secondary | ICD-10-CM | POA: Diagnosis not present

## 2023-01-21 DIAGNOSIS — N186 End stage renal disease: Secondary | ICD-10-CM | POA: Diagnosis not present

## 2023-01-21 DIAGNOSIS — Z992 Dependence on renal dialysis: Secondary | ICD-10-CM | POA: Diagnosis not present

## 2023-01-21 DIAGNOSIS — E8779 Other fluid overload: Secondary | ICD-10-CM | POA: Diagnosis not present

## 2023-01-22 DIAGNOSIS — E8779 Other fluid overload: Secondary | ICD-10-CM | POA: Diagnosis not present

## 2023-01-22 DIAGNOSIS — N186 End stage renal disease: Secondary | ICD-10-CM | POA: Diagnosis not present

## 2023-01-22 DIAGNOSIS — Z992 Dependence on renal dialysis: Secondary | ICD-10-CM | POA: Diagnosis not present

## 2023-01-23 ENCOUNTER — Encounter: Payer: Self-pay | Admitting: Physician Assistant

## 2023-01-23 DIAGNOSIS — N186 End stage renal disease: Secondary | ICD-10-CM | POA: Diagnosis not present

## 2023-01-23 DIAGNOSIS — Z992 Dependence on renal dialysis: Secondary | ICD-10-CM | POA: Diagnosis not present

## 2023-01-23 DIAGNOSIS — E8779 Other fluid overload: Secondary | ICD-10-CM | POA: Diagnosis not present

## 2023-02-03 ENCOUNTER — Other Ambulatory Visit: Payer: Self-pay | Admitting: Anesthesiology

## 2023-02-03 ENCOUNTER — Encounter: Payer: Self-pay | Admitting: Physician Assistant

## 2023-02-03 ENCOUNTER — Ambulatory Visit
Admission: RE | Admit: 2023-02-03 | Discharge: 2023-02-03 | Disposition: A | Discharge status: Home / Self Care | Payer: Medicare Other | Source: Ambulatory Visit | Attending: Anesthesiology | Admitting: Anesthesiology

## 2023-02-03 DIAGNOSIS — M25512 Pain in left shoulder: Secondary | ICD-10-CM | POA: Diagnosis not present

## 2023-02-03 DIAGNOSIS — G894 Chronic pain syndrome: Secondary | ICD-10-CM | POA: Diagnosis not present

## 2023-02-03 DIAGNOSIS — M25551 Pain in right hip: Secondary | ICD-10-CM

## 2023-02-03 DIAGNOSIS — M174 Other bilateral secondary osteoarthritis of knee: Secondary | ICD-10-CM | POA: Diagnosis not present

## 2023-02-03 DIAGNOSIS — M461 Sacroiliitis, not elsewhere classified: Secondary | ICD-10-CM | POA: Diagnosis not present

## 2023-02-04 ENCOUNTER — Encounter: Payer: Self-pay | Admitting: Physician Assistant

## 2023-02-05 ENCOUNTER — Encounter: Payer: Self-pay | Admitting: Physician Assistant

## 2023-02-09 ENCOUNTER — Encounter (HOSPITAL_COMMUNITY): Payer: Self-pay | Admitting: Psychiatry

## 2023-02-09 ENCOUNTER — Telehealth (HOSPITAL_COMMUNITY): Payer: Medicare Other | Admitting: Psychiatry

## 2023-02-09 VITALS — Wt 230.0 lb

## 2023-02-09 DIAGNOSIS — F4312 Post-traumatic stress disorder, chronic: Secondary | ICD-10-CM

## 2023-02-09 DIAGNOSIS — F331 Major depressive disorder, recurrent, moderate: Secondary | ICD-10-CM

## 2023-02-09 DIAGNOSIS — F411 Generalized anxiety disorder: Secondary | ICD-10-CM | POA: Diagnosis not present

## 2023-02-09 MED ORDER — ESCITALOPRAM OXALATE 10 MG PO TABS: 10.0000 mg | ORAL_TABLET | Freq: Every day | ORAL | 0 refills | Status: DC
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
## 2023-02-11 ENCOUNTER — Ambulatory Visit: Payer: Medicare Other | Admitting: Family Medicine

## 2023-02-11 ENCOUNTER — Encounter: Payer: Self-pay | Admitting: Family Medicine

## 2023-02-11 VITALS — BP 134/86 | HR 79 | Temp 97.6°F | Ht 67.0 in | Wt 221.0 lb

## 2023-02-11 DIAGNOSIS — F419 Anxiety disorder, unspecified: Secondary | ICD-10-CM

## 2023-02-11 DIAGNOSIS — G8929 Other chronic pain: Secondary | ICD-10-CM

## 2023-02-11 DIAGNOSIS — M5431 Sciatica, right side: Secondary | ICD-10-CM | POA: Diagnosis not present

## 2023-02-11 DIAGNOSIS — F32A Depression, unspecified: Secondary | ICD-10-CM

## 2023-02-11 DIAGNOSIS — J45909 Unspecified asthma, uncomplicated: Secondary | ICD-10-CM

## 2023-02-11 DIAGNOSIS — N186 End stage renal disease: Secondary | ICD-10-CM

## 2023-02-11 DIAGNOSIS — J069 Acute upper respiratory infection, unspecified: Secondary | ICD-10-CM

## 2023-02-11 DIAGNOSIS — M25551 Pain in right hip: Secondary | ICD-10-CM | POA: Diagnosis not present

## 2023-02-11 DIAGNOSIS — M25562 Pain in left knee: Secondary | ICD-10-CM

## 2023-02-11 DIAGNOSIS — M25552 Pain in left hip: Secondary | ICD-10-CM

## 2023-02-11 DIAGNOSIS — M25561 Pain in right knee: Secondary | ICD-10-CM

## 2023-02-11 DIAGNOSIS — Z992 Dependence on renal dialysis: Secondary | ICD-10-CM

## 2023-02-11 HISTORY — DX: Unspecified asthma, uncomplicated: J45.909

## 2023-02-11 NOTE — Patient Instructions (Signed)
Let me know if you do not hear from physical therapy in the next week.   Continue your current management of your hip/back pain and your upper respiratory symptoms.

## 2023-02-11 NOTE — Progress Notes (Signed)
Subjective:     Patient ID: Holly [name], female    DOB: [birth year], 37 y.o.   MRN: [id number]  Chief Complaint  Patient presents with   Medical Management of Chronic Issues    Got in with pain management, they did some xrays of back and shoulder and wants to discuss.    Cough    Cough, sore throat and nasal congestion started last thursday    HPI  Discussed the use of AI scribe software for clinical note transcription with the patient, who gave verbal consent to proceed.  History of Present Illness         She is under the care of pain management and psychiatry for MDD   Back on Lexapro and feels good about this medication, it has worked in the past, better for night terrors than Effexor.  Hoping to schedule with a counselor soon.   Appt at [facility] for transplant list on 03/21/2023   She has an upcoming lab visit with nephrology office.   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   States she needs PT referral to hip pain. Pain clinic cannot refer her.  Issues walking her usual distance.  Treated with narcotics now for hip pain via pain management.   Also using Voltaren gel and lidocaine patches   Mild OSA- has a CPAP but not doing well with it. Hoping to lose weight.      Health Maintenance Due  Topic Date Due   Medicare Annual Wellness (AWV)  Never done   DTaP/Tdap/Td (1 - Tdap) Never done   PAP SMEAR-Modifier  Never done    Past Medical History:  Diagnosis Date   Anemia    Anxiety    Arthritis    hands   Asthmatic bronchitis 02/11/2023   Blood transfusion without reported diagnosis    Cancer (HCC)    Complication of anesthesia    with Fentanyl she is difficult to awake   Depression    Dialysis patient (HCC)    Dyspnea    due to anemia   End stage renal disease (HCC)    Epilepsy (HCC)    Hashimoto's disease    History of kidney stones    HLD (hyperlipidemia)    Hypertension    Hypothyroidism    Membranoproliferative  glomerulonephritis type 2, idiopathic    Pneumonia    Renal disorder    Thyroid disease     Past Surgical History:  Procedure Laterality Date   A/V FISTULAGRAM Left 09/09/2022   Procedure: A/V Fistulagram;  Surgeon: Renford Dills, MD;  Location: [facility] INVASIVE CV LAB;  Service: Cardiovascular;  Laterality: Left;   ABDOMINAL HYSTERECTOMY     ABDOMINAL SURGERY     AV FISTULA PLACEMENT Left 01/17/2022   Procedure: ARTERIOVENOUS (AV) FISTULA CREATION ( RADIAL CEPHALIC);  Surgeon: Renford Dills, MD;  Location: [facility] ORS;  Service: Vascular;  Laterality: Left;   CERVICAL BIOPSY  W/ LOOP ELECTRODE EXCISION  2018   COLONOSCOPY N/A 01/30/2022   Procedure: COLONOSCOPY;  Surgeon: Jaynie Collins, DO;  Location: [facility] ENDOSCOPY;  Service: Gastroenterology;  Laterality: N/A;   COLONOSCOPY WITH PROPOFOL N/A 02/01/2022   Procedure: COLONOSCOPY WITH PROPOFOL;  Surgeon: Regis Bill, MD;  Location: [facility] ENDOSCOPY;  Service: Endoscopy;  Laterality: N/A;   DIAGNOSTIC LAPAROSCOPY     due to removal of PD catheters   DIALYSIS/PERMA CATHETER INSERTION N/A 06/09/2022   Procedure: DIALYSIS/PERMA CATHETER INSERTION;  Surgeon: Annice Needy, MD;  Location: [facility] INVASIVE CV LAB;  Service: Cardiovascular;  Laterality: N/A;   DIALYSIS/PERMA CATHETER INSERTION N/A 06/30/2022   Procedure: DIALYSIS/PERMA CATHETER INSERTION;  Surgeon: Cephus Shelling, MD;  Location: [facility] INVASIVE CV LAB;  Service: Cardiovascular;  Laterality: N/A;   ESOPHAGOGASTRODUODENOSCOPY N/A 01/30/2022   Procedure: ESOPHAGOGASTRODUODENOSCOPY (EGD);  Surgeon: Jaynie Collins, DO;  Location: [facility] ENDOSCOPY;  Service: Gastroenterology;  Laterality: N/A;   ESOPHAGOGASTRODUODENOSCOPY (EGD) WITH PROPOFOL N/A 02/01/2022   Procedure: ESOPHAGOGASTRODUODENOSCOPY (EGD) WITH PROPOFOL;  Surgeon: Regis Bill, MD;  Location: [facility] ENDOSCOPY;  Service: Endoscopy;  Laterality: N/A;   RENAL BIOPSY     x 2   TUMOR REMOVAL      right face   UTERINE ARTERY EMBOLIZATION  05/04/2022    Family History  Problem Relation Age of Onset   Multiple sclerosis Mother    Cancer Father    COPD Father     Social History   Socioeconomic History   Marital status: Significant Other    Spouse name: Chrissie Noa   Number of children: Not on file   Years of education: Not on file   Highest education level: Associate degree: occupational, Scientist, product/process development, or vocational program  Occupational History   Not on file  Tobacco Use   Smoking status: Former    Types: Cigarettes    Quit date: 2023    Years since quitting: 1.4   Smokeless tobacco: Never  Vaping Use   Vaping Use: Never used  Substance and Sexual Activity   Alcohol use: Never   Drug use: Never   Sexual activity: Not Currently  Other Topics Concern   Not on file  Social History Narrative   Lives with Chrissie Noa, significant other. One pet, cat.    Social Determinants of Health   Financial Resource Strain: Medium Risk (11/26/2022)   Overall Financial Resource Strain (CARDIA)    Difficulty of Paying Living Expenses: Somewhat hard  Food Insecurity: Food Insecurity Present (11/26/2022)   Hunger Vital Sign    Worried About Running Out of Food in the Last Year: Sometimes true    Ran Out of Food in the Last Year: Never true  Transportation Needs: Unmet Transportation Needs (11/26/2022)   PRAPARE - Transportation    Lack of Transportation (Medical): No    Lack of Transportation (Non-Medical): Yes  Physical Activity: Insufficiently Active (11/26/2022)   Exercise Vital Sign    Days of Exercise per Week: 3 days    Minutes of Exercise per Session: 10 min  Stress: Stress Concern Present (11/26/2022)   [employer] of Occupational Health - Occupational Stress Questionnaire    Feeling of Stress : Very much  Social Connections: Socially Isolated (11/26/2022)   Social Connection and Isolation Panel [NHANES]    Frequency of Communication with Friends and Family: Three times a week     Frequency of Social Gatherings with Friends and Family: Once a week    Attends Religious Services: Never    Database administrator or Organizations: No    Attends Engineer, structural: Not on file    Marital Status: Divorced  Catering manager Violence: Not on file    Outpatient Medications Prior to Visit  Medication Sig Dispense Refill   acyclovir (ZOVIRAX) 200 MG capsule Take 200 mg by mouth 2 (two) times daily as needed (out breaks).     albuterol (VENTOLIN HFA) 108 (90 Base) MCG/ACT inhaler Inhale 2-4 puffs into the lungs every 4 (four) hours as needed for wheezing (or cough). 18 g 0  amLODipine (NORVASC) 10 MG tablet Take 1 tablet (10 mg total) by mouth daily. 30 tablet 0   carvedilol (COREG) 12.5 MG tablet Take 12.5 mg by mouth 2 (two) times daily with a meal.     cetirizine (ZYRTEC) 10 MG tablet Take 10 mg by mouth at bedtime.     cinacalcet (SENSIPAR) 60 MG tablet Take 60 mg by mouth at bedtime.     diclofenac Sodium (VOLTAREN) 1 % GEL Apply 1 application  topically 4 (four) times daily as needed (hand,feet,thighs and back pain).     epoetin alfa (EPOGEN) 10000 UNIT/ML injection Inject 10,000 Units into the skin once a week. saturdays     epoetin alfa (EPOGEN) 20000 UNIT/ML injection Inject 20,000 Units into the skin once a week. Saturdays     escitalopram (LEXAPRO) 10 MG tablet Take 1 tablet (10 mg total) by mouth daily. 90 tablet 0   FOSRENOL 1000 MG chewable tablet Chew 1,000 mg by mouth 3 (three) times daily.     HYDROcodone-acetaminophen (NORCO/VICODIN) 5-325 MG tablet Take 1 tablet by mouth 3 (three) times daily as needed.     iron sucrose (VENOFER) 20 MG/ML injection Inject 1,000 mg into the vein every 30 (thirty) days.     levothyroxine (SYNTHROID) 200 MCG tablet Take 200 mcg by mouth daily before breakfast.     ondansetron (ZOFRAN-ODT) 4 MG disintegrating tablet Take 4 mg by mouth every 8 (eight) hours as needed for nausea or vomiting.     gabapentin (NEURONTIN) 100  MG capsule Take 100 mg by mouth 3 (three) times daily. (Patient not taking: Reported on 02/11/2023)     No facility-administered medications prior to visit.    Allergies  Allergen Reactions   Ciprofloxacin Anaphylaxis and Other (See Comments)    Caused Seizures   Seizures   Other reaction(s): Other (See Comments)  Seizures  Seizures     Caused Seizures  Seizures  Seizures  Other reaction(s): Other (See Comments) Seizures  Seizures  Seizures     Other reaction(s): Other (See Comments) Seizures  Seizures  Seizures     Caused Seizures  Caused Seizures  Seizures  Seizures  Other reaction(s): Other (See Comments) Seizures  Seizures  Seizures    Seizures Grand mal   Diphenhydramine Hcl Anaphylaxis    Patient states she is not allergic to this medication. Has it recently prior to transfusion  Patient states she is not allergic to this medication. Has it recently prior to transfusion    Other Reaction(s): Not available   Levetiracetam Anaphylaxis and Other (See Comments)    Medical Record H&P (unknown reaction)  Medical Record H&P  Other reaction(s): Other (See Comments)   Oxycodone-Acetaminophen Nausea Only and Other (See Comments)   Rituximab Anaphylaxis and Swelling   Tramadol Anaphylaxis and Other (See Comments)    Caused seizures  Other reaction(s): Other (See Comments)  Medical Record H&P  Medical Record H&P    Caused seizures    Review of Systems  Constitutional:  Negative for chills and fever.  Respiratory:  Negative for shortness of breath.   Cardiovascular:  Negative for chest pain, palpitations and leg swelling.  Gastrointestinal:  Negative for abdominal pain, constipation, diarrhea, nausea and vomiting.  Genitourinary:  Negative for dysuria, frequency and urgency.  Neurological:  Negative for dizziness.       Objective:    Physical Exam Constitutional:      General: She is not in acute distress.    Appearance: She is not ill-appearing.  Eyes:      Extraocular Movements: Extraocular movements intact.     Conjunctiva/sclera: Conjunctivae normal.  Cardiovascular:     Rate and Rhythm: Normal rate and regular rhythm.  Pulmonary:     Effort: Pulmonary effort is normal.     Breath sounds: Normal breath sounds.  Musculoskeletal:     Cervical back: Normal range of motion and neck supple.  Skin:    General: Skin is warm and dry.  Neurological:     General: No focal deficit present.     Mental Status: She is alert and oriented to person, place, and time.  Psychiatric:        Mood and Affect: Mood normal.        Behavior: Behavior normal.        Thought Content: Thought content normal.      BP 134/86 (BP Location: Right Arm, Patient Position: Sitting, Cuff Size: Large)   Pulse 79   Temp 97.6 F (36.4 C) (Temporal)   Ht 5\' 7"  (1.702 m)   Wt 221 lb (100.2 kg)   LMP 03/31/2022 (Exact Date) Comment: pt bleeding last 6 weeks with recent multiple transfusions  SpO2 98%   BMI 34.61 kg/m  Wt Readings from Last 3 Encounters:  02/11/23 221 lb (100.2 kg)  11/27/22 234 lb (106.1 kg)  10/21/22 218 lb (98.9 kg)       Assessment & Plan:   Problem List Items Addressed This Visit       Respiratory   Acute URI    Discussed symptomatic management.         Nervous and Auditory   Sciatica of right side    Referral to PT. Continue seeing pain management.       Relevant Orders   Ambulatory referral to Physical Therapy     Genitourinary   ESRD on dialysis (HCC) (Chronic)     Other   Anxiety and depression    Under the care of psychiatrist. Denies SI      Chronic arthralgias of knees and hips    Under the care of pain management.        Relevant Medications   HYDROcodone-acetaminophen (NORCO/VICODIN) 5-325 MG tablet   Pain of right hip - Primary    Reviewed recent imaging with patient at her request. Referral to PT      Relevant Orders   Ambulatory referral to Physical Therapy    I have discontinued Elnita Maxwell. Pascual's  gabapentin. I am also having her maintain her carvedilol, cinacalcet, levothyroxine, acyclovir, diclofenac Sodium, amLODipine, ondansetron, cetirizine, epoetin alfa, epoetin alfa, Fosrenol, albuterol, escitalopram, HYDROcodone-acetaminophen, and iron sucrose.  No orders of the defined types were placed in this encounter.

## 2023-02-16 DIAGNOSIS — M5431 Sciatica, right side: Secondary | ICD-10-CM | POA: Insufficient documentation

## 2023-02-16 DIAGNOSIS — M25551 Pain in right hip: Secondary | ICD-10-CM | POA: Insufficient documentation

## 2023-02-16 DIAGNOSIS — J069 Acute upper respiratory infection, unspecified: Secondary | ICD-10-CM | POA: Insufficient documentation

## 2023-02-16 NOTE — Assessment & Plan Note (Signed)
Under the care of psychiatrist. Denies SI 

## 2023-02-16 NOTE — Assessment & Plan Note (Signed)
Referral to PT. Continue seeing pain management.

## 2023-02-16 NOTE — Assessment & Plan Note (Signed)
Under the care of pain management

## 2023-02-16 NOTE — Assessment & Plan Note (Signed)
Discussed symptomatic management.

## 2023-02-16 NOTE — Assessment & Plan Note (Signed)
Reviewed recent imaging with patient at her request. Referral to PT

## 2023-02-18 NOTE — Therapy (Signed)
OUTPATIENT PHYSICAL THERAPY THORACOLUMBAR EVALUATION   Patient Name: Holly [name] MRN: [id number] DOB:27-Jan-1986, 37 y.o., female Today's Date: 02/20/2023  END OF SESSION:  PT End of Session - 02/19/23 0853     Visit Number 1    Number of Visits 17    Date for PT Re-Evaluation 04/24/23    Authorization Type MEDICARE PART A  AND B; [employer] COMM PPO    PT Start Time [phone]    PT Stop Time 0935    PT Time Calculation (min) 46 min    Activity Tolerance Patient tolerated treatment well    Behavior During Therapy [facility] for tasks assessed/performed             Past Medical History:  Diagnosis Date   Anemia    Anxiety    Arthritis    hands   Asthmatic bronchitis 02/11/2023   Blood transfusion without reported diagnosis    Cancer (HCC)    Complication of anesthesia    with Fentanyl she is difficult to awake   Depression    Dialysis patient (HCC)    Dyspnea    due to anemia   End stage renal disease (HCC)    Epilepsy (HCC)    Hashimoto's disease    History of kidney stones    HLD (hyperlipidemia)    Hypertension    Hypothyroidism    Membranoproliferative glomerulonephritis type 2, idiopathic    Pneumonia    Renal disorder    Thyroid disease    Past Surgical History:  Procedure Laterality Date   A/V FISTULAGRAM Left 09/09/2022   Procedure: A/V Fistulagram;  Surgeon: Renford Dills, MD;  Location: [facility];  Service: Cardiovascular;  Laterality: Left;   ABDOMINAL HYSTERECTOMY     ABDOMINAL SURGERY     AV FISTULA PLACEMENT Left 01/17/2022   Procedure: ARTERIOVENOUS (AV) FISTULA CREATION ( RADIAL CEPHALIC);  Surgeon: Renford Dills, MD;  Location: [facility];  Service: Vascular;  Laterality: Left;   CERVICAL BIOPSY  W/ LOOP ELECTRODE EXCISION  2018   COLONOSCOPY N/A 01/30/2022   Procedure: COLONOSCOPY;  Surgeon: Jaynie Collins, DO;  Location: [facility];  Service: Gastroenterology;  Laterality: N/A;   COLONOSCOPY WITH PROPOFOL N/A  02/01/2022   Procedure: COLONOSCOPY WITH PROPOFOL;  Surgeon: Regis Bill, MD;  Location: [facility];  Service: Endoscopy;  Laterality: N/A;   DIAGNOSTIC LAPAROSCOPY     due to removal of PD catheters   DIALYSIS/PERMA CATHETER INSERTION N/A 06/09/2022   Procedure: DIALYSIS/PERMA CATHETER INSERTION;  Surgeon: Annice Needy, MD;  Location: [facility];  Service: Cardiovascular;  Laterality: N/A;   DIALYSIS/PERMA CATHETER INSERTION N/A 06/30/2022   Procedure: DIALYSIS/PERMA CATHETER INSERTION;  Surgeon: Cephus Shelling, MD;  Location: [facility];  Service: Cardiovascular;  Laterality: N/A;   ESOPHAGOGASTRODUODENOSCOPY N/A 01/30/2022   Procedure: ESOPHAGOGASTRODUODENOSCOPY (EGD);  Surgeon: Jaynie Collins, DO;  Location: [facility] END[facility];  Service: Gastroenterology;  Laterality: N/A;   ESOPHAGOGASTRODUODENOSCOPY (EGD) WITH PROPOFOL N/A 02/01/2022   Procedure: ESOPHAGOGASTRODUODENOSCOPY (EGD) WITH PROPOFOL;  Surgeon: Regis Bill, MD;  Location: [facility] ENDOS[facility];  Service: Endoscopy;  Laterality: N/A;   RENAL BIOPSY     x 2   TUMOR REMOVAL     right face   UTERINE ARTERY EMBOLIZATION  05/04/2022   Patient Active Problem List   Diagnosis Date Noted   Pain of right hip 02/16/2023   Sciatica of right side 02/16/2023   Acute URI 02/16/2023   OSA on CPAP  11/27/2022   Chronic arthralgias of knees and hips 11/27/2022   Bilateral hand pain 11/27/2022   Chronic pain syndrome 11/27/2022   Anxiety and depression 11/27/2022   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   Snoring 10/21/2022   Witnessed episode of apnea 10/21/2022   Excessive sleepiness 10/21/2022   Fatigue 10/21/2022   Insomnia 10/21/2022   Bilateral ovarian cysts 08/25/2022   Symptomatic anemia 08/25/2022   End stage renal disease (HCC) 08/07/2022   Pelvic fluid collection 07/19/2022   Anemia 03/31/2022   Menorrhagia 02/19/2022   ABLA (acute blood loss anemia) 02/19/2022   Fluid overload  02/01/2022   Hyponatremia 01/31/2022   Hyperkalemia 01/30/2022   CKD (chronic kidney disease), stage IV (HCC) 12/26/2021   MPGN (membranoproliferative glomerulonephritis), 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   Essential hypertension 11/08/2021   Hypothyroidism 11/08/2021   PD catheter dysfunction (HCC) 10/21/2021   Anemia of chronic disease 10/07/2021   Peritoneal dialysis catheter fitting or adjustment (HCC) 10/04/2021   ESRD on dialysis (HCC) 11/05/2018   Glomerulonephritis 11/05/2018    PCP: Avanell Shackleton, NP-C   REFERRING PROVIDER: Avanell Shackleton, NP-C   REFERRING DIAG: M25.551 (ICD-10-CM) - Pain of right hip, M54.31 (ICD-10-CM) - Sciatica of right side.   Rationale for Evaluation and Treatment: Rehabilitation  THERAPY DIAG:  Other low back pain  Muscle weakness (generalized)  Difficulty in walking, not elsewhere classified  ONSET DATE: 1 year  SUBJECTIVE:                                                                                                                                                                                           SUBJECTIVE STATEMENT: Pt reports a chronic hx of low back and R hip pain at times extending down the R lateral thigh. As the R sided pain increases, L LBP pain can develop as well. Pt notes when she was on peritoneal dialysis she developed an infection/sepsis and developed anemia, increased pain, and has become deconditioned. 1.5 years ago was able to walk 12,000 steps/day, now she    can only walk 3,000 due to pain and loss of stamina. She also feels unsteady at times, and that when painful, her R hip can feel out of joint when walking.  PERTINENT HISTORY:  Anemia, anxiety, end stage renal disease, hemodialysis, Hashimoto's disease   PAIN:  Are you having pain? Yes: NPRS scale: 4/10 Pain location: Low back and R hip pain extending down the thig Pain description: ache, sharp Aggravating factors: Walking-grocery shopping Relieving factors:  Voltaren, lidocaine patches, hydrocodone Baseline pain: 4/10 Pain range on eval: 3-8/10  PRECAUTIONS: None  WEIGHT BEARING RESTRICTIONS: No  FALLS:  Has patient fallen in last 6 months? Yes. Number of falls 2 Using a walking pad. Has stopped the walking pad.  LIVING ENVIRONMENT: Lives with: lives with their family Lives in: House/apartment No issue with accessing or being mobile in home.  OCCUPATION: No working  PLOF: Independent with basic ADLs  PATIENT GOALS: Decrease pain and improve function  NEXT MD VISIT: 2 months  OBJECTIVE:   DIAGNOSTIC FINDINGS:  02/03/23 DG SI Joints FINDINGS: Similar lateral sclerosis and irregularity of both SI joints without ankylosis compatible with bilateral sacroiliitis worse on the right. These changes are better appreciated by the comparison CT reconstructions from 08/25/2022. Bony pelvis and hips are symmetric and intact. No acute osseous finding or fracture. Lower lumbar spine unremarkable.   IMPRESSION: 1. Similar bilateral sacroiliitis, worse on the right. 2. No acute finding by plain radiography.  FINDINGS: Hips appear symmetric and intact. Preserved joint spaces. No acute osseous finding or fracture. Bilateral SI joint arthropathy/sacroiliitis again noted.   02/03/23 DG Hip bilat  IMPRESSION: 1. No acute osseous finding or significant degenerative change. 2. Bilateral SI joint arthropathy/sacroiliitis.  PATIENT SURVEYS:   FOTO: Perceived function   34%, predicted   55%   SCREENING FOR RED FLAGS: Bowel or bladder incontinence: No Spinal tumors: No Cauda equina syndrome: No Compression fracture: No  COGNITION: Overall cognitive status: Within functional limits for tasks assessed     SENSATION: WFL  MUSCLE LENGTH: Hamstrings: Right 50 deg; Left 50 deg Thomas test: Right NT deg; Left NT deg  POSTURE: increased lumbar lordosis  PALPATION: TTP to R SI jt, R gluteal muscles and to the R greater trochanter to a  lesser degree  LUMBAR ROM:   AROM eval  Flexion Min limitation;tight low back and HS  Extension Full, pain mid central low bac  Right lateral flexion Full  Left lateral flexion Full  Right rotation Full  Left rotation Full, R rib cage pain   (Blank rows = not tested)  LOWER EXTREMITY MMT:     Active  Right eval Left eval  Hip flexion 4 4  Hip extension 3+ 3+  Hip abduction 4, adductor crap 4  Hip adduction    Hip internal rotation    Hip external rotation 4+ 4+  Knee flexion    Knee extension    Ankle dorsiflexion    Ankle plantarflexion    Ankle inversion    Ankle eversion     (Blank rows = not tested)  LOWER EXTREMITY ROM:    ROM Right eval Left eval  Hip flexion WNLs WNLs  Hip extension    Hip abduction    Hip adduction    Hip internal rotation 15d 15d  Hip external rotation WNLs WNLs  Knee flexion    Knee extension    Ankle dorsiflexion    Ankle plantarflexion    Ankle inversion    Ankle eversion     (Blank rows = not tested)  LUMBAR SPECIAL TESTS:  Straight leg raise test: Negative, Slump test: Negative, and SI Compression/distraction test: Negative  FUNCTIONAL TESTS:  5 times sit to stand: TBA 2 minute walk test: TBA  GAIT: Distance walked: 270ft Assistive device utilized: None Level of assistance: Complete Independence Comments: Occasionally will use a SPC when in significant pain  TODAY'S TREATMENT:   [facility] Adult PT Treatment:  DATE: 02/19/23 Therapeutic Exercise: Developed, instructed in, and pt completed therex as noted in HEP  Pt did not feel a significant stretch c R piriformis and IT stretches                                                                                                                        PATIENT EDUCATION:  Education details: Eval findings, POC, HEP, self care  Person educated: Patient Education method: Explanation, Demonstration, Tactile cues, Verbal cues, and  Handouts Education comprehension: verbalized understanding, returned demonstration, verbal cues required, and tactile cues required  HOME EXERCISE PROGRAM: Access Code: [email or web address] URL: [email or web address] Date: 02/19/2023 Prepared by: Joellyn Rued  Exercises - Hooklying Hamstring Stretch with Strap  - 1 x daily - 7 x weekly - 1 sets - 10 reps  ASSESSMENT:  CLINICAL IMPRESSION: Patient is a 37 y.o. female who was seen today for physical therapy evaluation and treatment for M25.551 (ICD-10-CM) - Pain of right hip, M54.31 (ICD-10-CM) - Sciatica of right side. On eval, palpation reproduced concordant pain, while ROM and MMT assessments did not. Although, pt did experienced pain in other areas during the eval, (rib cage and R hip adductors). Eval did reveal decreased trunk flexion ROM due to low back and LE tightness, increased lumbar lordosis, and bilat hip weakness. Pt will benefit from skilled PT 2w8 to address impairments to optimize functional mobility and activity tolerance with less pain.   OBJECTIVE IMPAIRMENTS: decreased activity tolerance, difficulty walking, decreased ROM, decreased strength, postural dysfunction, obesity, and pain.   ACTIVITY LIMITATIONS: carrying, lifting, bending, standing, squatting, sleeping, and locomotion level  PARTICIPATION LIMITATIONS: meal prep, cleaning, laundry, shopping, and community activity  PERSONAL FACTORS: Past/current experiences, Time since onset of injury/illness/exacerbation, and 3+ comorbidities: Anemia, anxiety, end stage renal disease, hemodialysis, Hashimoto's disease  are also affecting patient's functional outcome.   REHAB POTENTIAL: Good  CLINICAL DECISION MAKING: Evolving/moderate complexity  EVALUATION COMPLEXITY: Moderate   GOALS:  SHORT TERM GOALS: Target date: 03/14/23  Pt will be Ind in an initial HEP  Baseline: started Goal status: INITIAL  2.  Pt will voice understanding of measures to assist in pain  reduction  Baseline:  Goal status: INITIAL  LONG TERM GOALS: Target date: 04/24/23  Pt will be Ind in a final HEP to maintain achieved LOF  Baseline: started Goal status: INITIAL  2.  Increase bilat hip strength to 4+/5 or greater for improved funtional mobility Baseline: see flow sheets Goal status: INITIAL  3.  Increase bilat H/S flexibility to 60d or greater for improved functional mobility Baseline: 50d each Goal status: INITIAL  4.  Improve 5xSTS by MCID of 5" and by MCID of 61ft as indication of improved functional mobility  Baseline: To be assessed Goal status: INITIAL  5.  Pt's FOTO score will improved to the predicted value of 55% as indication of improved function  Baseline: 34% Goal status: INITIAL  PLAN:  PT FREQUENCY: 2x/week  PT DURATION: 8 weeks  PLANNED  INTERVENTIONS: Therapeutic exercises, Therapeutic activity, Neuromuscular re-education, Balance training, Gait training, Patient/Family education, Self Care, Joint mobilization, Aquatic Therapy, Dry Needling, Spinal mobilization, Cryotherapy, Moist heat, Taping, Vasopneumatic device, Traction, Ultrasound, Ionotophoresis 4mg /ml Dexamethasone, Manual therapy, and Re-evaluation.  PLAN FOR NEXT SESSION: Review FOTO; assess response to HEP; progress therex as indicated; use of modalities, manual therapy; and TPDN as indicated.   Eaden Hettinger MS, PT 02/20/23 11:09 AM

## 2023-02-19 ENCOUNTER — Ambulatory Visit: Payer: Medicare Other | Attending: Family Medicine

## 2023-02-19 ENCOUNTER — Other Ambulatory Visit: Payer: Self-pay

## 2023-02-19 DIAGNOSIS — M6281 Muscle weakness (generalized): Secondary | ICD-10-CM | POA: Diagnosis present

## 2023-02-19 DIAGNOSIS — M5459 Other low back pain: Secondary | ICD-10-CM | POA: Diagnosis present

## 2023-02-19 DIAGNOSIS — R262 Difficulty in walking, not elsewhere classified: Secondary | ICD-10-CM | POA: Insufficient documentation

## 2023-02-19 DIAGNOSIS — M5431 Sciatica, right side: Secondary | ICD-10-CM | POA: Diagnosis not present

## 2023-02-19 DIAGNOSIS — M25551 Pain in right hip: Secondary | ICD-10-CM | POA: Diagnosis not present

## 2023-02-23 DIAGNOSIS — M174 Other bilateral secondary osteoarthritis of knee: Secondary | ICD-10-CM | POA: Diagnosis not present

## 2023-02-23 DIAGNOSIS — M25512 Pain in left shoulder: Secondary | ICD-10-CM | POA: Diagnosis not present

## 2023-02-23 DIAGNOSIS — M461 Sacroiliitis, not elsewhere classified: Secondary | ICD-10-CM | POA: Diagnosis not present

## 2023-02-23 DIAGNOSIS — G894 Chronic pain syndrome: Secondary | ICD-10-CM | POA: Diagnosis not present

## 2023-02-24 NOTE — Therapy (Signed)
OUTPATIENT PHYSICAL THERAPY TREATMENT   Patient Name: Holly [name] MRN: [id number] DOB:02-Mar-1986, 37 y.o., female Today's Date: 02/26/2023  END OF SESSION:  PT End of Session - 02/26/23 1331     Visit Number 2    Number of Visits 17    Date for PT Re-Evaluation 04/24/23    Authorization Type MEDICARE PART A  AND B; [employer] COMM PPO    PT Start Time 1635    PT Stop Time 1720    PT Time Calculation (min) 45 min    Activity Tolerance Patient tolerated treatment well    Behavior During Therapy WFL for tasks assessed/performed              Past Medical History:  Diagnosis Date   Anemia    Anxiety    Arthritis    hands   Asthmatic bronchitis 02/11/2023   Blood transfusion without reported diagnosis    Cancer (HCC)    Complication of anesthesia    with Fentanyl she is difficult to awake   Depression    Dialysis patient (HCC)    Dyspnea    due to anemia   End stage renal disease (HCC)    Epilepsy (HCC)    Hashimoto's disease    History of kidney stones    HLD (hyperlipidemia)    Hypertension    Hypothyroidism    Membranoproliferative glomerulonephritis type 2, idiopathic    Pneumonia    Renal disorder    Thyroid disease    Past Surgical History:  Procedure Laterality Date   A/V FISTULAGRAM Left 09/09/2022   Procedure: A/V Fistulagram;  Surgeon: Renford Dills, MD;  Location: [facility];  Service: Cardiovascular;  Laterality: Left;   ABDOMINAL HYSTERECTOMY     ABDOMINAL SURGERY     AV FISTULA PLACEMENT Left 01/17/2022   Procedure: ARTERIOVENOUS (AV) FISTULA CREATION ( RADIAL CEPHALIC);  Surgeon: Renford Dills, MD;  Location: [facility];  Service: Vascular;  Laterality: Left;   CERVICAL BIOPSY  W/ LOOP ELECTRODE EXCISION  2018   COLONOSCOPY N/A 01/30/2022   Procedure: COLONOSCOPY;  Surgeon: Jaynie Collins, DO;  Location: [facility] ENDOSCOPY;  Service: Gastroenterology;  Laterality: N/A;   COLONOSCOPY WITH PROPOFOL N/A 02/01/2022   Procedure:  COLONOSCOPY WITH PROPOFOL;  Surgeon: Regis Bill, MD;  Location: [facility] ENDOSCOPY;  Service: Endoscopy;  Laterality: N/A;   DIAGNOSTIC LAPAROSCOPY     due to removal of PD catheters   DIALYSIS/PERMA CATHETER INSERTION N/A 06/09/2022   Procedure: DIALYSIS/PERMA CATHETER INSERTION;  Surgeon: Annice Needy, MD;  Location: [facility];  Service: Cardiovascular;  Laterality: N/A;   DIALYSIS/PERMA CATHETER INSERTION N/A 06/30/2022   Procedure: DIALYSIS/PERMA CATHETER INSERTION;  Surgeon: Cephus Shelling, MD;  Location: [facility];  Service: Cardiovascular;  Laterality: N/A;   ESOPHAGOGASTRODUODENOSCOPY N/A 01/30/2022   Procedure: ESOPHAGOGASTRODUODENOSCOPY (EGD);  Surgeon: Jaynie Collins, DO;  Location[facility] ENDOSCOPY;  Service: Gastroenterology;  Laterality: N/A;   ESOPHAGOGASTRODUODENOSCOPY (EGD) WITH PROPOFOL N/A 02/01/2022   Procedure: ESOPHAGOGASTRODUODENOSCOPY (EGD) WITH PROPOFOL;  Surgeon: Regis Bill, MD;  Location: [facility] ENDOSCOPY;  Service: Endoscopy;  Laterality: N/A;   RENAL BIOPSY     x 2   TUMOR REMOVAL     right face   UTERINE ARTERY EMBOLIZATION  05/04/2022   Patient Active Problem List   Diagnosis Date Noted   Pain of right hip 02/16/2023   Sciatica of right side 02/16/2023   Acute URI 02/16/2023   OSA on CPAP  11/27/2022   Chronic arthralgias of knees and hips 11/27/2022   Bilateral hand pain 11/27/2022   Chronic pain syndrome 11/27/2022   Anxiety and depression 11/27/2022   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   Snoring 10/21/2022   Witnessed episode of apnea 10/21/2022   Excessive sleepiness 10/21/2022   Fatigue 10/21/2022   Insomnia 10/21/2022   Bilateral ovarian cysts 08/25/2022   Symptomatic anemia 08/25/2022   End stage renal disease (HCC) 08/07/2022   Pelvic fluid collection 07/19/2022   Anemia 03/31/2022   Menorrhagia 02/19/2022   ABLA (acute blood loss anemia) 02/19/2022   Fluid overload 02/01/2022    Hyponatremia 01/31/2022   Hyperkalemia 01/30/2022   CKD (chronic kidney disease), stage IV (HCC) 12/26/2021   MPGN (membranoproliferative glomerulonephritis), 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   Essential hypertension 11/08/2021   Hypothyroidism 11/08/2021   PD catheter dysfunction (HCC) 10/21/2021   Anemia of chronic disease 10/07/2021   Peritoneal dialysis catheter fitting or adjustment (HCC) 10/04/2021   ESRD on dialysis (HCC) 11/05/2018   Glomerulonephritis 11/05/2018    PCP: Avanell Shackleton, NP-C   REFERRING PROVIDER: Avanell Shackleton, NP-C   REFERRING DIAG: M25.551 (ICD-10-CM) - Pain of right hip, M54.31 (ICD-10-CM) - Sciatica of right side.   Rationale for Evaluation and Treatment: Rehabilitation  THERAPY DIAG:  Other low back pain  Muscle weakness (generalized)  Difficulty in walking, not elsewhere classified  ONSET DATE: 1 year  SUBJECTIVE:                                                                                                                                                                                           SUBJECTIVE STATEMENT:   PAIN:  Are you having pain? Yes: NPRS scale: 4-6/10 Pain location: Low back and R hip pain extending down the thig Pain description: ache, sharp Aggravating factors: Walking-grocery shopping Relieving factors: Voltaren, lidocaine patches, hydrocodone Baseline pain: 4/10 Pain range on eval: 3-8/10  PERTINENT HISTORY:  Anemia, anxiety, end stage renal disease, hemodialysis, Hashimoto's disease   PRECAUTIONS: None  WEIGHT BEARING RESTRICTIONS: No  FALLS:  Has patient fallen in last 6 months? Yes. Number of falls 2 Using a walking pad. Has stopped the walking pad.  LIVING ENVIRONMENT: Lives with: lives with their family Lives in: House/apartment No issue with accessing or being mobile in home.  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  These changes are better appreciated by the comparison CT reconstructions from 08/25/2022. Bony pelvis and hips are symmetric and intact. No acute osseous finding or fracture. Lower lumbar spine unremarkable.   IMPRESSION: 1. Similar bilateral sacroiliitis, worse on the right. 2. No acute finding by plain radiography.  FINDINGS: Hips appear symmetric and intact. Preserved joint spaces. No acute osseous finding or fracture. Bilateral SI joint arthropathy/sacroiliitis again noted.   02/03/23 DG Hip bilat  IMPRESSION: 1. No acute osseous finding or significant degenerative change. 2. Bilateral SI joint arthropathy/sacroiliitis.  PATIENT SURVEYS:   FOTO: Perceived function   34%, predicted   55%   SCREENING FOR RED FLAGS: Bowel or bladder incontinence: No Spinal tumors: No Cauda equina syndrome: No Compression fracture: No  COGNITION: Overall cognitive status: Within functional limits for tasks assessed     SENSATION: WFL  MUSCLE LENGTH: Hamstrings: Right 50 deg; Left 50 deg Thomas test: Right NT deg; Left NT deg  POSTURE: increased lumbar lordosis  PALPATION: TTP to R SI jt, R gluteal muscles and to the R greater trochanter to a lesser degree  LUMBAR ROM:   AROM eval  Flexion Min limitation;tight low back and HS  Extension Full, pain mid central low bac  Right lateral flexion Full  Left lateral flexion Full  Right rotation Full  Left rotation Full, R rib cage pain   (Blank rows = not tested)  LOWER EXTREMITY MMT:     Active  Right eval Left eval  Hip flexion 4 4  Hip extension 3+ 3+  Hip abduction 4, adductor crap 4  Hip adduction    Hip internal rotation    Hip external rotation 4+ 4+  Knee flexion    Knee extension    Ankle  dorsiflexion    Ankle plantarflexion    Ankle inversion    Ankle eversion     (Blank rows = not tested)  LOWER EXTREMITY ROM:    ROM Right eval Left eval  Hip flexion WNLs WNLs  Hip extension    Hip abduction    Hip adduction    Hip internal rotation 15d 15d  Hip external rotation WNLs WNLs  Knee flexion    Knee extension    Ankle dorsiflexion    Ankle plantarflexion    Ankle inversion    Ankle eversion     (Blank rows = not tested)  LUMBAR SPECIAL TESTS:  Straight leg raise test: Negative, Slump test: Negative, and SI Compression/distraction test: Negative  FUNCTIONAL TESTS:  5 times sit to stand: TBA 2 minute walk test: TBA  GAIT: Distance walked: 235ft Assistive device utilized: None Level of assistance: Complete Independence Comments: Occasionally will use a SPC when in significant pain  TODAY'S TREATMENT:   [facility] Adult PT Treatment:                                                DATE: 02/25/23 Therapeutic Exercise: Nu step 3 mins L3 UE/LE SKTC 1x 20" Supine hamstring stretches 1x20" Seated trunk flexion  Seated abd press c soft roller x15 3" Banded STS GTB 2x10 SLS x5 20" each Updated HEP  [facility] Adult PT Treatment:  DATE: 02/19/23 Therapeutic Exercise: Developed, instructed in, and pt completed therex as noted in HEP  Pt did not feel a significant stretch c R piriformis and IT stretches                                                                                                                        PATIENT EDUCATION:  Education details: Eval findings, POC, HEP, self care  Person educated: Patient Education method: Explanation, Demonstration, Tactile cues, Verbal cues, and Handouts Education comprehension: verbalized understanding, returned demonstration, verbal cues required, and tactile cues required  HOME EXERCISE PROGRAM: Access Code: [email or web address] URL: [email or web address] Date:  02/25/2023 Prepared by: Joellyn Rued  Exercises - Hooklying Hamstring Stretch with Strap  - 1-2 x daily - 7 x weekly - 1 sets - 3 reps - 20 hold - Hooklying Single Knee to Chest  - 1-2 x daily - 7 x weekly - 1 sets - 3 reps - 20 hold - Seated Flexion Stretch  - 1-2 x daily - 7 x weekly - 1 sets - 3 reps - 15 hold - Seated Abdominal Press into [employer]  - 1-2 x daily - 7 x weekly - 1 sets - 10 reps - 3 hold - Sit to Stand Without Arm Support  - 1-2 x daily - 7 x weekly - 1 sets - 10 reps - Standing Single Leg Stance with Counter Support  - 1-2 x daily - 7 x weekly - 1 sets - 5 reps - 10 hold  ASSESSMENT:  CLINICAL IMPRESSION: PT was completed for lumbopelvic flexibility and strengthening. PT was completed in sitting or standing due to pt not tolerating supine with report of sacral pain. Pt tolerated the prescribed therex without adverse effects. Pt returned proper demonstration of HEP. Pt will continue to benefit from skilled PT to address impairments for improved function.  OBJECTIVE IMPAIRMENTS: decreased activity tolerance, difficulty walking, decreased ROM, decreased strength, postural dysfunction, obesity, and pain.   ACTIVITY LIMITATIONS: carrying, lifting, bending, standing, squatting, sleeping, and locomotion level  PARTICIPATION LIMITATIONS: meal prep, cleaning, laundry, shopping, and community activity  PERSONAL FACTORS: Past/current experiences, Time since onset of injury/illness/exacerbation, and 3+ comorbidities: Anemia, anxiety, end stage renal disease, hemodialysis, Hashimoto's disease  are also affecting patient's functional outcome.   REHAB POTENTIAL: Good  CLINICAL DECISION MAKING: Evolving/moderate complexity  EVALUATION COMPLEXITY: Moderate   GOALS:  SHORT TERM GOALS: Target date: 03/14/23  Pt will be Ind in an initial HEP  Baseline: started Goal status: Ongoing  2.  Pt will voice understanding of measures to assist in pain reduction  Baseline:  Goal  status: INITIAL  LONG TERM GOALS: Target date: 04/24/23  Pt will be Ind in a final HEP to maintain achieved LOF  Baseline: started Goal status: INITIAL  2.  Increase bilat hip strength to 4+/5 or greater for improved funtional mobility Baseline: see flow sheets Goal status: INITIAL  3.  Increase bilat H/S flexibility to 60d or greater for improved functional mobility  Baseline: 50d each Goal status: INITIAL  4.  Improve 5xSTS by MCID of 5" and by MCID of 19ft as indication of improved functional mobility  Baseline: To be assessed Goal status: INITIAL  5.  Pt's FOTO score will improved to the predicted value of 55% as indication of improved function  Baseline: 34% Goal status: INITIAL  PLAN:  PT FREQUENCY: 2x/week  PT DURATION: 8 weeks  PLANNED INTERVENTIONS: Therapeutic exercises, Therapeutic activity, Neuromuscular re-education, Balance training, Gait training, Patient/Family education, Self Care, Joint mobilization, Aquatic Therapy, Dry Needling, Spinal mobilization, Cryotherapy, Moist heat, Taping, Vasopneumatic device, Traction, Ultrasound, Ionotophoresis 4mg /ml Dexamethasone, Manual therapy, and Re-evaluation.  PLAN FOR NEXT SESSION: Review FOTO; assess response to HEP; progress therex as indicated; use of modalities, manual therapy; and TPDN as indicated.   Schelly Chuba MS, PT 02/26/23 1:32 PM

## 2023-02-25 ENCOUNTER — Ambulatory Visit: Payer: Medicare Other | Attending: Family Medicine

## 2023-02-25 DIAGNOSIS — M6281 Muscle weakness (generalized): Secondary | ICD-10-CM | POA: Diagnosis present

## 2023-02-25 DIAGNOSIS — M5459 Other low back pain: Secondary | ICD-10-CM | POA: Insufficient documentation

## 2023-02-25 DIAGNOSIS — R262 Difficulty in walking, not elsewhere classified: Secondary | ICD-10-CM | POA: Insufficient documentation

## 2023-03-03 NOTE — Therapy (Addendum)
OUTPATIENT PHYSICAL THERAPY TREATMENT/DC   Patient Name: Holly [name] MRN: [id number] DOB:10/02/1985, 37 y.o., female Today's Date: 03/05/2023  END OF SESSION:  PT End of Session - 03/05/23 0722     Visit Number 3    Number of Visits 17    Date for PT Re-Evaluation 04/24/23    Authorization Type MEDICARE PART A  AND B; [employer] COMM PPO    PT Start Time 0719    PT Stop Time 0745    PT Time Calculation (min) 26 min    Activity Tolerance Patient tolerated treatment well    Behavior During Therapy [facility] for tasks assessed/performed             Past Medical History:  Diagnosis Date   Anemia    Anxiety    Arthritis    hands   Asthmatic bronchitis 02/11/2023   Blood transfusion without reported diagnosis    Cancer (HCC)    Complication of anesthesia    with Fentanyl she is difficult to awake   Depression    Dialysis patient (HCC)    Dyspnea    due to anemia   End stage renal disease (HCC)    Epilepsy (HCC)    Hashimoto's disease    History of kidney stones    HLD (hyperlipidemia)    Hypertension    Hypothyroidism    Membranoproliferative glomerulonephritis type 2, idiopathic    Pneumonia    Renal disorder    Thyroid disease    Past Surgical History:  Procedure Laterality Date   A/V FISTULAGRAM Left 09/09/2022   Procedure: A/V Fistulagram;  Surgeon: Renford Dills, MD;  Location: [facility] INVASIVE CV LAB;  Service: Cardiovascular;  Laterality: Left;   ABDOMINAL HYSTERECTOMY     ABDOMINAL SURGERY     AV FISTULA PLACEMENT Left 01/17/2022   Procedure: ARTERIOVENOUS (AV) FISTULA CREATION ( RADIAL CEPHALIC);  Surgeon: Renford Dills, MD;  Location: [facility] ORS;  Service: Vascular;  Laterality: Left;   CERVICAL BIOPSY  W/ LOOP ELECTRODE EXCISION  2018   COLONOSCOPY N/A 01/30/2022   Procedure: COLONOSCOPY;  Surgeon: Jaynie Collins, DO;  Location: [facility] ENDOSCOPY;  Service: Gastroenterology;  Laterality: N/A;   COLONOSCOPY WITH PROPOFOL N/A 02/01/2022   Procedure:  COLONOSCOPY WITH PROPOFOL;  Surgeon: Regis Bill, MD;  Location: [facility] ENDOSCOPY;  Service: Endoscopy;  Laterality: N/A;   DIAGNOSTIC LAPAROSCOPY     due to removal of PD catheters   DIALYSIS/PERMA CATHETER INSERTION N/A 06/09/2022   Procedure: DIALYSIS/PERMA CATHETER INSERTION;  Surgeon: Annice Needy, MD;  Location: [facility] INV[facility]IVE CV [facility];  Service: Cardiovascular;  Laterality: N/A;   DIALYSIS/PERMA CATHETER INSERTION N/A 06/30/2022   Procedure: DIALYSIS/PERMA CATHETER INSERTION;  Surgeon: Cephus Shelling, MD;  Location: [facility] INVASIVE CV [facility];  Service: Cardiovascular;  Laterality: N/A;   ESOPHAGOGASTRODUODENOSCOPY N/A 01/30/2022   Procedure: ESOPHAGOGASTRODUODENOSCOPY (EGD);  Surgeon: Jaynie Collins, DO;  Location: [facility] ENDOSCOPY;  Service: Gastroenterology;  Laterality: N/A;   ESOPHAGOGASTRODUODENOSCOPY (EGD) WITH PROPOFOL N/A 02/01/2022   Procedure: ESOPHAGOGASTRODUODENOSCOPY (EGD) WITH PROPOFOL;  Surgeon: Regis Bill, MD;  Location: [facility] ENDOSCOPY;  Service: Endoscopy;  Laterality: N/A;   RENAL BIOPSY     x 2   TUMOR REMOVAL     right face   UTERINE ARTERY EMBOLIZATION  05/04/2022   Patient Active Problem List   Diagnosis Date Noted   Pain of right hip 02/16/2023   Sciatica of right side 02/16/2023   Acute URI 02/16/2023   OSA on CPAP 11/27/2022  Chronic arthralgias of knees and hips 11/27/2022   Bilateral hand pain 11/27/2022   Chronic pain syndrome 11/27/2022   Anxiety and depression 11/27/2022   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   Snoring 10/21/2022   Witnessed episode of apnea 10/21/2022   Excessive sleepiness 10/21/2022   Fatigue 10/21/2022   Insomnia 10/21/2022   Bilateral ovarian cysts 08/25/2022   Symptomatic anemia 08/25/2022   End stage renal disease (HCC) 08/07/2022   Pelvic fluid collection 07/19/2022   Anemia 03/31/2022   Menorrhagia 02/19/2022   ABLA (acute blood loss anemia) 02/19/2022   Fluid overload 02/01/2022    Hyponatremia 01/31/2022   Hyperkalemia 01/30/2022   CKD (chronic kidney disease), stage IV (HCC) 12/26/2021   MPGN (membranoproliferative glomerulonephritis), 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   Essential hypertension 11/08/2021   Hypothyroidism 11/08/2021   PD catheter dysfunction (HCC) 10/21/2021   Anemia of chronic disease 10/07/2021   Peritoneal dialysis catheter fitting or adjustment (HCC) 10/04/2021   ESRD on dialysis (HCC) 11/05/2018   Glomerulonephritis 11/05/2018    PCP: Avanell Shackleton, NP-C   REFERRING PROVIDER: Avanell Shackleton, NP-C   REFERRING DIAG: M25.551 (ICD-10-CM) - Pain of right hip, M54.31 (ICD-10-CM) - Sciatica of right side.   Rationale for Evaluation and Treatment: Rehabilitation  THERAPY DIAG:  Other low back pain  Muscle weakness (generalized)  Difficulty in walking, not elsewhere classified  ONSET DATE: 1 year  SUBJECTIVE:                                                                                                                                                                                           SUBJECTIVE STATEMENT: Pt reports she has been feeling better. She is taking pain meds prescribed from a pain management center. Pt notes she is completing her HEP.  PAIN:  Are you having pain? Yes: NPRS scale: 2-3/10 Pain location: Low back and R hip pain extending down the thig Pain description: ache, sharp Aggravating factors: Walking-grocery shopping Relieving factors: Voltaren, lidocaine patches, hydrocodone Baseline pain: 4/10 Pain range on eval: 3-8/10  PERTINENT HISTORY:  Anemia, anxiety, end stage renal disease, hemodialysis, Hashimoto's disease   PRECAUTIONS: None  WEIGHT BEARING RESTRICTIONS: No  FALLS:  Has patient fallen in last 6 months? Yes. Number of falls 2 Using a walking pad. Has stopped the walking pad.  LIVING ENVIRONMENT: Lives with: lives with their family Lives in: House/apartment No issue with accessing or being mobile  in home.  OCCUPATION: No working  PLOF: Independent with basic ADLs  PATIENT GOALS: Decrease pain and improve function  NEXT MD VISIT: 2 months  OBJECTIVE:   DIAGNOSTIC FINDINGS:  02/03/23 DG SI  Joints FINDINGS: Similar lateral sclerosis and irregularity of both SI joints without ankylosis compatible with bilateral sacroiliitis worse on the right. These changes are better appreciated by the comparison CT reconstructions from 08/25/2022. Bony pelvis and hips are symmetric and intact. No acute osseous finding or fracture. Lower lumbar spine unremarkable.   IMPRESSION: 1. Similar bilateral sacroiliitis, worse on the right. 2. No acute finding by plain radiography.  FINDINGS: Hips appear symmetric and intact. Preserved joint spaces. No acute osseous finding or fracture. Bilateral SI joint arthropathy/sacroiliitis again noted.   02/03/23 DG Hip bilat  IMPRESSION: 1. No acute osseous finding or significant degenerative change. 2. Bilateral SI joint arthropathy/sacroiliitis.  PATIENT SURVEYS:   FOTO: Perceived function   34%, predicted   55%   SCREENING FOR RED FLAGS: Bowel or bladder incontinence: No Spinal tumors: No Cauda equina syndrome: No Compression fracture: No  COGNITION: Overall cognitive status: Within functional limits for tasks assessed     SENSATION: WFL  MUSCLE LENGTH: Hamstrings: Right 50 deg; Left 50 deg Thomas test: Right NT deg; Left NT deg  POSTURE: increased lumbar lordosis  PALPATION: TTP to R SI jt, R gluteal muscles and to the R greater trochanter to a lesser degree  LUMBAR ROM:   AROM eval  Flexion Min limitation;tight low back and HS  Extension Full, pain mid central low bac  Right lateral flexion Full  Left lateral flexion Full  Right rotation Full  Left rotation Full, R rib cage pain   (Blank rows = not tested)  LOWER EXTREMITY MMT:     Active  Right eval Left eval  Hip flexion 4 4  Hip extension 3+ 3+  Hip abduction 4,  adductor crap 4  Hip adduction    Hip internal rotation    Hip external rotation 4+ 4+  Knee flexion    Knee extension    Ankle dorsiflexion    Ankle plantarflexion    Ankle inversion    Ankle eversion     (Blank rows = not tested)  LOWER EXTREMITY ROM:    ROM Right eval Left eval  Hip flexion WNLs WNLs  Hip extension    Hip abduction    Hip adduction    Hip internal rotation 15d 15d  Hip external rotation WNLs WNLs  Knee flexion    Knee extension    Ankle dorsiflexion    Ankle plantarflexion    Ankle inversion    Ankle eversion     (Blank rows = not tested)  LUMBAR SPECIAL TESTS:  Straight leg raise test: Negative, Slump test: Negative, and SI Compression/distraction test: Negative  FUNCTIONAL TESTS:  5 times sit to stand: TBA 2 minute walk test: TBA  GAIT: Distance walked: 261ft Assistive device utilized: None Level of assistance: Complete Independence Comments: Occasionally will use a SPC when in significant pain  TODAY'S TREATMENT:   [facility] Adult PT Treatment:                                                DATE: 03/05/23 Therapeutic Exercise: Nu step 3 mins L3 UE/LE SKTC 1x 20" Seated hamstring stretches 1x20" Seated trunk flexion  Seated abd press c soft roller x15 3" Banded STS GTB 2x10 Saeted diagonal core c yellow heavy ball x12 each  [facility] Adult PT Treatment:  DATE: 02/25/23 Therapeutic Exercise: Nu step 3 mins L3 UE/LE SKTC 1x 20" Supine hamstring stretches 2x20" Seated trunk flexion  Seated abd press c soft roller x15 3" Banded STS GTB x5, x5 10#  SLS x5 20" each Updated HEP  [facility] Adult PT Treatment:                                                DATE: 02/19/23 Therapeutic Exercise: Developed, instructed in, and pt completed therex as noted in HEP  Pt did not feel a significant stretch c R piriformis and IT stretches                                                                                                                         PATIENT EDUCATION:  Education details: Eval findings, POC, HEP, self care  Person educated: Patient Education method: Explanation, Demonstration, Tactile cues, Verbal cues, and Handouts Education comprehension: verbalized understanding, returned demonstration, verbal cues required, and tactile cues required  HOME EXERCISE PROGRAM: Access Code: [email or web address] URL: [email or web address] Date: 02/25/2023 Prepared by: Joellyn Rued  Exercises - Hooklying Hamstring Stretch with Strap  - 1-2 x daily - 7 x weekly - 1 sets - 3 reps - 20 hold - Hooklying Single Knee to Chest  - 1-2 x daily - 7 x weekly - 1 sets - 3 reps - 20 hold - Seated Flexion Stretch  - 1-2 x daily - 7 x weekly - 1 sets - 3 reps - 15 hold - Seated Abdominal Press into [employer]  - 1-2 x daily - 7 x weekly - 1 sets - 10 reps - 3 hold - Sit to Stand Without Arm Support  - 1-2 x daily - 7 x weekly - 1 sets - 10 reps - Standing Single Leg Stance with Counter Support  - 1-2 x daily - 7 x weekly - 1 sets - 5 reps - 10 hold  ASSESSMENT:  CLINICAL IMPRESSION: Today's PT appt was cut short due to a urgent matter with the pt's family. PT was continued for lumbopelvic flexibility and strengthening. Pt is being consistent with her HEP. Overall, pt is responding positively to her to her global care program. Pt will continue to benefit from skilled PT to address impairments for improved function.  OBJECTIVE IMPAIRMENTS: decreased activity tolerance, difficulty walking, decreased ROM, decreased strength, postural dysfunction, obesity, and pain.   ACTIVITY LIMITATIONS: carrying, lifting, bending, standing, squatting, sleeping, and locomotion level  PARTICIPATION LIMITATIONS: meal prep, cleaning, laundry, shopping, and community activity  PERSONAL FACTORS: Past/current experiences, Time since onset of injury/illness/exacerbation, and 3+ comorbidities: Anemia, anxiety, end stage renal disease,  hemodialysis, Hashimoto's disease  are also affecting patient's functional outcome.   REHAB POTENTIAL: Good  CLINICAL DECISION MAKING: Evolving/moderate complexity  EVALUATION COMPLEXITY: Moderate   GOALS:  SHORT TERM GOALS: Target date: 03/14/23  Pt  will be Ind in an initial HEP  Baseline: started Goal status: Ongoing  2.  Pt will voice understanding of measures to assist in pain reduction  Baseline:  Goal status: INITIAL  LONG TERM GOALS: Target date: 04/24/23  Pt will be Ind in a final HEP to maintain achieved LOF  Baseline: started Goal status: INITIAL  2.  Increase bilat hip strength to 4+/5 or greater for improved funtional mobility Baseline: see flow sheets Goal status: INITIAL  3.  Increase bilat H/S flexibility to 60d or greater for improved functional mobility Baseline: 50d each Goal status: INITIAL  4.  Improve 5xSTS by MCID of 5" and by MCID of 46ft as indication of improved functional mobility  Baseline: To be assessed Goal status: INITIAL  5.  Pt's FOTO score will improved to the predicted value of 55% as indication of improved function  Baseline: 34% Goal status: INITIAL  PLAN:  PT FREQUENCY: 2x/week  PT DURATION: 8 weeks  PLANNED INTERVENTIONS: Therapeutic exercises, Therapeutic activity, Neuromuscular re-education, Balance training, Gait training, Patient/Family education, Self Care, Joint mobilization, Aquatic Therapy, Dry Needling, Spinal mobilization, Cryotherapy, Moist heat, Taping, Vasopneumatic device, Traction, Ultrasound, Ionotophoresis 4mg /ml Dexamethasone, Manual therapy, and Re-evaluation.  PLAN FOR NEXT SESSION: Review FOTO; assess response to HEP; progress therex as indicated; use of modalities, manual therapy; and TPDN as indicated.   Gale Hulse MS, PT 03/05/23 8:01 AM  PHYSICAL THERAPY DISCHARGE SUMMARY  Visits from Start of Care: 3  Current functional level related to goals / functional outcomes: See clinical impression  and PT goals    Remaining deficits: See clinical impression and PT goals    Education / Equipment: HEP   Patient agrees to discharge. Patient goals were not met. Patient is being discharged due to not returning since the last visit.   Nour Scalise MS, PT 09/11/23 10:03 AM

## 2023-03-05 ENCOUNTER — Ambulatory Visit: Payer: Medicare Other

## 2023-03-05 DIAGNOSIS — M5459 Other low back pain: Secondary | ICD-10-CM | POA: Diagnosis not present

## 2023-03-05 DIAGNOSIS — R262 Difficulty in walking, not elsewhere classified: Secondary | ICD-10-CM

## 2023-03-05 DIAGNOSIS — M6281 Muscle weakness (generalized): Secondary | ICD-10-CM

## 2023-03-07 ENCOUNTER — Ambulatory Visit: Payer: Medicare Other

## 2023-03-10 ENCOUNTER — Ambulatory Visit: Payer: Medicare Other | Admitting: Physical Therapy

## 2023-03-12 ENCOUNTER — Ambulatory Visit: Payer: Medicare Other

## 2023-03-12 ENCOUNTER — Telehealth: Payer: Self-pay

## 2023-03-12 NOTE — Telephone Encounter (Signed)
Spoke to pt re: no show appt tday. Reminded pt of her next appt and advised of the attendance policy.

## 2023-03-17 ENCOUNTER — Ambulatory Visit: Payer: Medicare Other

## 2023-03-18 ENCOUNTER — Encounter: Payer: Self-pay | Admitting: Family Medicine

## 2023-03-18 ENCOUNTER — Ambulatory Visit (HOSPITAL_COMMUNITY)
Admission: EM | Admit: 2023-03-18 | Discharge: 2023-03-18 | Disposition: A | Discharge status: Home / Self Care | Payer: Medicare Other | Attending: Physician Assistant | Admitting: Physician Assistant

## 2023-03-18 ENCOUNTER — Encounter (HOSPITAL_COMMUNITY): Payer: Self-pay | Admitting: Emergency Medicine

## 2023-03-18 DIAGNOSIS — L03116 Cellulitis of left lower limb: Secondary | ICD-10-CM | POA: Diagnosis not present

## 2023-03-18 MED ORDER — SULFAMETHOXAZOLE-TRIMETHOPRIM 400-80 MG PO TABS: 1.0000 | ORAL_TABLET | Freq: Every day | ORAL | 0 refills | Status: AC

## 2023-03-18 NOTE — Discharge Instructions (Addendum)
At this time I am concerned that you may have cellulitis on your left foot  Please take one  Bactrim tablet per day by mouth after you have finished your dialysis sessions until the antibiotic course is finished You can take Tylenol as needed for pain Please seek prompt medical care or go to the ED if you develop any of the following: increased pain, swelling, spreading rash, blisters, fever, swelling past your ankle.

## 2023-03-18 NOTE — ED Provider Notes (Signed)
[facility]    CSN: [id number] Arrival date & time: 03/18/23  1750      History   Chief Complaint Chief Complaint  Patient presents with   Foot Pain    HPI [name] is a 37 y.o. female.   HPI  Left foot pain and swelling  Onset; Sudden  Duration:She reports pain along lateral aspect of left foot- especially aggravated by straightening foot  She report pain started about 3-4 days ago followed by swelling 2 days ago She has noticed a rash forming along the area  She went to dialysis last night and reached her dry weight but swelling has persisted after this  Associated symptoms: she reports nausea and headache started today  Pain level: 4-5/10, right now it feels like a burning sensation and tight/ stinging  Interventions: she has taken 2 hydrocodones today   Past Medical History:  Diagnosis Date   Anemia    Anxiety    Arthritis    hands   Asthmatic bronchitis 02/11/2023   Blood transfusion without reported diagnosis    Cancer (HCC)    Complication of anesthesia    with Fentanyl she is difficult to awake   Depression    Dialysis patient (HCC)    Dyspnea    due to anemia   End stage renal disease (HCC)    Epilepsy (HCC)    Hashimoto's disease    History of kidney stones    HLD (hyperlipidemia)    Hypertension    Hypothyroidism    Membranoproliferative glomerulonephritis type 2, idiopathic    Pneumonia    Renal disorder    Thyroid disease     Patient Active Problem List   Diagnosis Date Noted   Pain of right hip 02/16/2023   Sciatica of right side 02/16/2023   Acute URI 02/16/2023   OSA on CPAP 11/27/2022   Chronic arthralgias of knees and hips 11/27/2022   Bilateral hand pain 11/27/2022   Chronic pain syndrome 11/27/2022   Anxiety and depression 11/27/2022   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   Snoring 10/21/2022   Witnessed episode of apnea 10/21/2022   Excessive sleepiness 10/21/2022   Fatigue 10/21/2022    Insomnia 10/21/2022   Bilateral ovarian cysts 08/25/2022   Symptomatic anemia 08/25/2022   End stage renal disease (HCC) 08/07/2022   Pelvic fluid collection 07/19/2022   Anemia 03/31/2022   Menorrhagia 02/19/2022   ABLA (acute blood loss anemia) 02/19/2022   Fluid overload 02/01/2022   Hyponatremia 01/31/2022   Hyperkalemia 01/30/2022   CKD (chronic kidney disease), stage IV (HCC) 12/26/2021   MPGN (membranoproliferative glomerulonephritis), 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   Essential hypertension 11/08/2021   Hypothyroidism 11/08/2021   PD catheter dysfunction (HCC) 10/21/2021   Anemia of chronic disease 10/07/2021   Peritoneal dialysis catheter fitting or adjustment (HCC) 10/04/2021   ESRD on dialysis (HCC) 11/05/2018   Glomerulonephritis 11/05/2018    Past Surgical History:  Procedure Laterality Date   A/V FISTULAGRAM Left 09/09/2022   Procedure: A/V Fistulagram;  Surgeon: Renford Dills, MD;  Location: [facility];  Service: Cardiovascular;  Laterality: Left;   ABDOMINAL HYSTERECTOMY     ABDOMINAL SURGERY     AV FISTULA PLACEMENT Left 01/17/2022   Procedure: ARTERIOVENOUS (AV) FISTULA CREATION ( RADIAL CEPHALIC);  Surgeon: Renford Dills, MD;  Location: [facility];  Service: Vascular;  Laterality: Left;   CERVICAL BIOPSY  W/ LOOP ELECTRODE EXCISION  2018   COLONOSCOPY N/A 01/30/2022   Procedure: COLONOSCOPY;  Surgeon: Jaynie Collins, DO;  Location: [facility] ENDOSCOPY;  Service: Gastroenterology;  Laterality: N/A;   COLONOSCOPY WITH PROPOFOL N/A 02/01/2022   Procedure: COLONOSCOPY WITH PROPOFOL;  Surgeon: Regis Bill, MD;  Location: [facility] ENDOSCOPY;  Service: Endoscopy;  Laterality: N/A;   DIAGNOSTIC LAPAROSCOPY     due to removal of PD catheters   DIALYSIS/PERMA CATHETER INSERTION N/A 06/09/2022   Procedure: DIALYSIS/PERMA CATHETER INSERTION;  Surgeon: Annice Needy, MD;  Location: [facility];  Service: Cardiovascular;  Laterality: N/A;    DIALYSIS/PERMA CATHETER INSERTION N/A 06/30/2022   Procedure: DIALYSIS/PERMA CATHETER INSERTION;  Surgeon: Cephus Shelling, MD;  [facility];  Service: Cardiovascular;  Laterality: N/A;   ESOPHAGOGASTRODUODENOSCOPY N/A 01/30/2022   Procedure: ESOPHAGOGASTRODUODENOSCOPY (EGD);  Surgeon: Jaynie Collins, DO;  Location: [facility] ENDOSCOPY;  Service: Gastroenterology;  Laterality: N/A;   ESOPHAGOGASTRODUODENOSCOPY (EGD) WITH PROPOFOL N/A 02/01/2022   Procedure: ESOPHAGOGASTRODUODENOSCOPY (EGD) WITH PROPOFOL;  Surgeon: Regis Bill, MD;  Location: [facility] ENDOSCOPY;  Service: Endoscopy;  Laterality: N/A;   RENAL BIOPSY     x 2   TUMOR REMOVAL     right face   UTERINE ARTERY EMBOLIZATION  05/04/2022    OB History     Gravida  1   Para      Term      Preterm      AB  1   Living         SAB  1   IAB      Ectopic      Multiple      Live Births               Home Medications    Prior to Admission medications   Medication Sig Start Date End Date Taking? Authorizing Provider  sulfamethoxazole-trimethoprim (BACTRIM) 400-80 MG tablet Take 1 tablet by mouth daily for 7 days. 03/18/23 03/25/23 Yes Vickee Mormino E, PA-C  acyclovir (ZOVIRAX) 200 MG capsule Take 200 mg by mouth 2 (two) times daily as needed (out breaks). 08/14/21   [provider]  albuterol (VENTOLIN HFA) 108 (90 Base) MCG/ACT inhaler Inhale 2-4 puffs into the lungs every 4 (four) hours as needed for wheezing (or cough). 12/04/22   [name], [name], NP-C  amLODipine (NORVASC) 10 MG tablet Take 1 tablet (10 mg total) by mouth daily. 02/02/22   Alford Highland, MD  carvedilol (COREG) 12.5 MG tablet Take 12.5 mg by mouth 2 (two) times daily with a meal.    [provider]  cetirizine (ZYRTEC) 10 MG tablet Take 10 mg by mouth at bedtime.    [provider]  cinacalcet (SENSIPAR) 60 MG tablet Take 60 mg by mouth at bedtime.    [provider]  diclofenac  Sodium (VOLTAREN) 1 % GEL Apply 1 application  topically 4 (four) times daily as needed (hand,feet,thighs and back pain).    [provider]  epoetin alfa (EPOGEN) 10000 UNIT/ML injection Inject 10,000 Units into the skin once a week. saturdays    [provider]  epoetin alfa (EPOGEN) 20000 UNIT/ML injection Inject 20,000 Units into the skin once a week. Saturdays    [provider]  escitalopram (LEXAPRO) 10 MG tablet Take 1 tablet (10 mg total) by mouth daily. 02/09/23   Arfeen, Phillips Grout, MD  FOSRENOL 1000 MG chewable tablet Chew 1,000 mg by mouth 3 (three) times daily.    [provider]  HYDROcodone-acetaminophen (NORCO/VICODIN) 5-325 MG tablet Take 1 tablet by  mouth 3 (three) times daily as needed.    [provider]  iron sucrose (VENOFER) 20 MG/ML injection Inject 1,000 mg into the vein every 30 (thirty) days.    [provider]  levothyroxine (SYNTHROID) 200 MCG tablet Take 200 mcg by mouth daily before breakfast.    [provider]  ondansetron (ZOFRAN-ODT) 4 MG disintegrating tablet Take 4 mg by mouth every 8 (eight) hours as needed for nausea or vomiting. 01/08/22   [provider]    Family History Family History  Problem Relation Age of Onset   Multiple sclerosis Mother    Cancer Father    COPD Father     Social History Social History   Tobacco Use   Smoking status: Former    Current packs/day: 0.00    Types: Cigarettes    Quit date: 2023    Years since quitting: 1.5   Smokeless tobacco: Never  Vaping Use   Vaping status: Never Used  Substance Use Topics   Alcohol use: Never   Drug use: Never     Allergies   Ciprofloxacin, Diphenhydramine hcl, Levetiracetam, Oxycodone-acetaminophen, Rituximab, and Tramadol   Review of Systems Review of Systems  Constitutional:  Positive for chills. Negative for diaphoresis and fever.  Gastrointestinal:  Positive for nausea.  Musculoskeletal:        Left  foot pain    Skin:  Positive for rash.  Neurological:  Positive for headaches.     Physical Exam Triage Vital Signs ED Triage Vitals  Encounter Vitals Group     BP 03/18/23 1812 (!) 142/85     Systolic BP Percentile --      Diastolic BP Percentile --      Pulse Rate 03/18/23 1812 82     Resp 03/18/23 1812 18     Temp 03/18/23 1812 98.4 F (36.9 C)     Temp Source 03/18/23 1812 Oral     SpO2 03/18/23 1812 97 %     Weight --      Height --      Head Circumference --      Peak Flow --      Pain Score 03/18/23 1811 5     Pain Loc --      Pain Education --      Exclude from Growth Chart --    No data found.  Updated Vital Signs BP (!) 142/85 (BP Location: Right Arm)   Pulse 82   Temp 98.4 F (36.9 C) (Oral)   Resp 18   LMP 03/31/2022 (Exact Date) Comment: pt bleeding last 6 weeks with recent multiple transfusions  SpO2 97%   Visual Acuity Right Eye Distance:   Left Eye Distance:   Bilateral Distance:    Right Eye Near:   Left Eye Near:    Bilateral Near:     Physical Exam Vitals reviewed.  Constitutional:      General: She is awake.     Appearance: Normal appearance. She is well-developed and well-groomed.  HENT:     Head: Normocephalic and atraumatic.  Cardiovascular:     Pulses: Normal pulses.          Dorsalis pedis pulses are 2+ on the right side and 2+ on the left side.     Comments: Mild pitting edema to dorsum of left foot Calves are not swollen, tight, or tender to touch, no erythema present and calves are symmetrical in size  Pulmonary:     Effort: Pulmonary effort is  normal.  Musculoskeletal:     Comments: Strength is 5/5 with dorsiflexion and plantar flexion bilaterally   Skin:    General: Skin is warm and dry.     Findings: Rash present. Rash is macular.       Neurological:     Mental Status: She is alert.  Psychiatric:        Behavior: Behavior is cooperative.      UC Treatments / Results  Labs (all labs ordered are listed, but  only abnormal results are displayed) Labs Reviewed - No data to display  EKG   Radiology No results found.  Procedures Procedures (including critical care time)  Medications Ordered in UC Medications - No data to display  Initial Impression / Assessment and Plan / UC Course  I have reviewed the triage vital signs and the nursing notes.  Pertinent labs & imaging results that were available during my care of the patient were reviewed by me and considered in my medical decision making (see chart for details).    Patient presents with tenderness, mild pitting edema and rash to left lateral foot for the past few days  She denies improvement in symptoms after dialysis, denies known injuries or insect bites to the area    Final Clinical Impressions(s) / UC Diagnoses   Suspect mild cellulitis of left foot at this time  Reviewed dosing recommendations of Bactrim for dialysis patient - will send Bactrim single strength tablets to be taken once per day after completing dialysis treatments  Reviewed signs of worsening symptoms and return precautions- she voiced understanding and agreement Follow up as needed for persistent or progressing symptoms     Final diagnoses:  Cellulitis of left lower extremity     Discharge Instructions      At this time I am concerned that you may have cellulitis on your left foot  Please take one  Bactrim tablet per day by mouth after you have finished your dialysis sessions until the antibiotic course is finished You can take Tylenol as needed for pain Please seek prompt medical care or go to the ED if you develop any of the following: increased pain, swelling, spreading rash, blisters, fever, swelling past your ankle.      ED Prescriptions     Medication Sig Dispense Auth. Provider   sulfamethoxazole-trimethoprim (BACTRIM) 400-80 MG tablet Take 1 tablet by mouth daily for 7 days. 7 tablet Donnisha Besecker E, PA-C      PDMP not reviewed this  encounter.   Roselind Messier 03/18/23 1858

## 2023-03-18 NOTE — ED Triage Notes (Signed)
Pt reports left foot swelling and painful for about 4 days. Did dialysis last night and reports met dry weight but still has swelling in left foot. Has red area and warmth to foot. Denies injury

## 2023-03-19 ENCOUNTER — Encounter: Payer: Self-pay | Admitting: Physician Assistant

## 2023-03-19 ENCOUNTER — Ambulatory Visit: Payer: Medicare Other

## 2023-03-23 DIAGNOSIS — M25512 Pain in left shoulder: Secondary | ICD-10-CM | POA: Diagnosis not present

## 2023-03-23 DIAGNOSIS — M461 Sacroiliitis, not elsewhere classified: Secondary | ICD-10-CM | POA: Diagnosis not present

## 2023-03-23 DIAGNOSIS — M174 Other bilateral secondary osteoarthritis of knee: Secondary | ICD-10-CM | POA: Diagnosis not present

## 2023-03-23 DIAGNOSIS — G894 Chronic pain syndrome: Secondary | ICD-10-CM | POA: Diagnosis not present

## 2023-03-24 ENCOUNTER — Ambulatory Visit: Payer: Medicare Other

## 2023-03-26 ENCOUNTER — Ambulatory Visit: Payer: BC Managed Care – PPO

## 2023-03-31 ENCOUNTER — Ambulatory Visit: Payer: BC Managed Care – PPO

## 2023-04-01 ENCOUNTER — Other Ambulatory Visit (HOSPITAL_COMMUNITY): Payer: Self-pay

## 2023-04-01 MED ORDER — HYDROCODONE-ACETAMINOPHEN 5-325 MG PO TABS
1.0000 | ORAL_TABLET | Freq: Four times a day (QID) | ORAL | 0 refills | Status: DC | PRN
  Filled 2023-04-01: qty 120, 30d supply, fill #0

## 2023-04-02 ENCOUNTER — Ambulatory Visit: Payer: BC Managed Care – PPO | Admitting: Physical Therapy

## 2023-04-02 ENCOUNTER — Encounter: Payer: Self-pay | Admitting: Physician Assistant

## 2023-04-07 ENCOUNTER — Ambulatory Visit: Payer: BC Managed Care – PPO

## 2023-04-09 ENCOUNTER — Ambulatory Visit: Payer: BC Managed Care – PPO | Admitting: Physical Therapy

## 2023-04-13 ENCOUNTER — Telehealth (HOSPITAL_COMMUNITY): Payer: Medicare Other | Admitting: Psychiatry

## 2023-04-13 ENCOUNTER — Encounter (HOSPITAL_COMMUNITY): Payer: Self-pay | Admitting: Psychiatry

## 2023-04-13 VITALS — Wt 221.0 lb

## 2023-04-13 DIAGNOSIS — F331 Major depressive disorder, recurrent, moderate: Secondary | ICD-10-CM | POA: Diagnosis not present

## 2023-04-13 DIAGNOSIS — F411 Generalized anxiety disorder: Secondary | ICD-10-CM

## 2023-04-13 DIAGNOSIS — F4312 Post-traumatic stress disorder, chronic: Secondary | ICD-10-CM | POA: Diagnosis not present
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
## 2023-04-14 ENCOUNTER — Ambulatory Visit: Payer: BC Managed Care – PPO

## 2023-04-16 ENCOUNTER — Ambulatory Visit: Payer: BC Managed Care – PPO | Admitting: Physical Therapy

## 2023-04-21 ENCOUNTER — Ambulatory Visit: Payer: BC Managed Care – PPO

## 2023-04-23 ENCOUNTER — Ambulatory Visit: Payer: BC Managed Care – PPO

## 2023-05-04 ENCOUNTER — Other Ambulatory Visit (HOSPITAL_COMMUNITY): Payer: Self-pay

## 2023-05-04 MED ORDER — HYDROCODONE-ACETAMINOPHEN 5-325 MG PO TABS
1.0000 | ORAL_TABLET | Freq: Four times a day (QID) | ORAL | 0 refills | Status: DC | PRN
  Filled 2023-05-04: qty 120, 30d supply, fill #0

## 2023-05-07 ENCOUNTER — Ambulatory Visit: Payer: BC Managed Care – PPO | Admitting: Neurology

## 2023-05-07 ENCOUNTER — Telehealth: Payer: Self-pay | Admitting: Neurology

## 2023-05-07 NOTE — Telephone Encounter (Signed)
LVM and sent mychart msg informing pt of r/s needed for today's appt- MD out.

## 2023-05-09 ENCOUNTER — Other Ambulatory Visit (HOSPITAL_COMMUNITY): Payer: Self-pay | Admitting: Psychiatry

## 2023-05-09 DIAGNOSIS — F411 Generalized anxiety disorder: Secondary | ICD-10-CM

## 2023-05-09 DIAGNOSIS — F4312 Post-traumatic stress disorder, chronic: Secondary | ICD-10-CM

## 2023-05-09 DIAGNOSIS — F331 Major depressive disorder, recurrent, moderate: Secondary | ICD-10-CM

## 2023-05-14 ENCOUNTER — Ambulatory Visit: Payer: Medicare Other | Admitting: Family Medicine

## 2023-05-19 ENCOUNTER — Other Ambulatory Visit (HOSPITAL_COMMUNITY): Payer: Self-pay

## 2023-05-19 DIAGNOSIS — M25512 Pain in left shoulder: Secondary | ICD-10-CM | POA: Diagnosis not present

## 2023-05-19 DIAGNOSIS — G894 Chronic pain syndrome: Secondary | ICD-10-CM | POA: Diagnosis not present

## 2023-05-19 DIAGNOSIS — M461 Sacroiliitis, not elsewhere classified: Secondary | ICD-10-CM | POA: Diagnosis not present

## 2023-05-19 DIAGNOSIS — M174 Other bilateral secondary osteoarthritis of knee: Secondary | ICD-10-CM | POA: Diagnosis not present

## 2023-05-19 MED ORDER — HYDROCODONE-ACETAMINOPHEN 5-325 MG PO TABS
1.0000 | ORAL_TABLET | Freq: Four times a day (QID) | ORAL | 0 refills | Status: DC | PRN
  Filled 2023-07-01: qty 120, 30d supply, fill #0

## 2023-05-19 MED ORDER — HYDROCODONE-ACETAMINOPHEN 5-325 MG PO TABS
1.0000 | ORAL_TABLET | Freq: Four times a day (QID) | ORAL | 0 refills | Status: DC | PRN
  Filled 2023-06-02: qty 120, 30d supply, fill #0

## 2023-05-20 ENCOUNTER — Telehealth: Payer: Self-pay | Admitting: Internal Medicine

## 2023-05-20 NOTE — Telephone Encounter (Signed)
Scheduled appointment per referral. Patient is aware of the appointment time and date. All questions were answered.

## 2023-05-23 ENCOUNTER — Encounter (HOSPITAL_COMMUNITY): Payer: Self-pay

## 2023-05-29 ENCOUNTER — Telehealth: Payer: Self-pay | Admitting: Internal Medicine

## 2023-05-29 ENCOUNTER — Telehealth (HOSPITAL_COMMUNITY): Payer: Self-pay

## 2023-05-29 NOTE — Telephone Encounter (Signed)
Referral sent to [facility] for ADHD testing.

## 2023-05-29 NOTE — Telephone Encounter (Signed)
Called patient regarding upcoming October appointments, patient is notified.  

## 2023-05-29 NOTE — Progress Notes (Deleted)
[facility] OFFICE PROGRESS NOTE  Avanell Shackleton, NP-C [address] [place] [address]  DIAGNOSIS: ***  PRIOR THERAPY:  CURRENT THERAPY:  INTERVAL HISTORY: Holly [name] 37 y.o. female returns for *** regular *** visit for followup of ***   Gets reacrit at dialysis center.   MEDICAL HISTORY: Past Medical History:  Diagnosis Date   Anemia    Anxiety    Arthritis    hands   Asthmatic bronchitis 02/11/2023   Blood transfusion without reported diagnosis    Cancer (HCC)    Complication of anesthesia    with Fentanyl she is difficult to awake   Depression    Dialysis patient (HCC)    Dyspnea    due to anemia   End stage renal disease (HCC)    Epilepsy (HCC)    Hashimoto's disease    History of kidney stones    HLD (hyperlipidemia)    Hypertension    Hypothyroidism    Membranoproliferative glomerulonephritis type 2, idiopathic    Pneumonia    Renal disorder    Thyroid disease     ALLERGIES:  is allergic to ciprofloxacin, diphenhydramine hcl, levetiracetam, oxycodone-acetaminophen, rituximab, and tramadol.  MEDICATIONS:  Current Outpatient Medications  Medication Sig Dispense Refill   acyclovir (ZOVIRAX) 200 MG capsule Take 200 mg by mouth 2 (two) times daily as needed (out breaks).     albuterol (VENTOLIN HFA) 108 (90 Base) MCG/ACT inhaler Inhale 2-4 puffs into the lungs every 4 (four) hours as needed for wheezing (or cough). 18 g 0   amLODipine (NORVASC) 10 MG tablet Take 1 tablet (10 mg total) by mouth daily. 30 tablet 0   carvedilol (COREG) 12.5 MG tablet Take 12.5 mg by mouth 2 (two) times daily with a meal.     cetirizine (ZYRTEC) 10 MG tablet Take 10 mg by mouth at bedtime.     cinacalcet (SENSIPAR) 60 MG tablet Take 60 mg by mouth at bedtime.     diclofenac Sodium (VOLTAREN) 1 % GEL Apply 1 application  topically 4 (four) times daily as needed (hand,feet,thighs and back pain).     epoetin alfa (EPOGEN) 10000 UNIT/ML  injection Inject 10,000 Units into the skin once a week. saturdays     epoetin alfa (EPOGEN) 20000 UNIT/ML injection Inject 20,000 Units into the skin once a week. Saturdays     escitalopram (LEXAPRO) 10 MG tablet Take 1 tablet (10 mg total) by mouth daily. (Patient not taking: Reported on 04/13/2023) 90 tablet 0   FOSRENOL 1000 MG chewable tablet Chew 1,000 mg by mouth 3 (three) times daily.     HYDROcodone-acetaminophen (NORCO/VICODIN) 5-325 MG tablet Take 1 tablet by mouth 3 (three) times daily as needed.     HYDROcodone-acetaminophen (NORCO/VICODIN) 5-325 MG tablet Take 1 tablet by mouth 4 (four) times daily as needed for pain 120 tablet 0   HYDROcodone-acetaminophen (NORCO/VICODIN) 5-325 MG tablet Take 1 tablet by mouth 4 (four) times daily as needed for pain. 120 tablet 0   [START ON 06/02/2023] HYDROcodone-acetaminophen (NORCO/VICODIN) 5-325 MG tablet Take 1 tablet by mouth 4 (four) times daily as needed for pain. 120 tablet 0   [START ON 07/01/2023] HYDROcodone-acetaminophen (NORCO/VICODIN) 5-325 MG tablet Take 1 tablet by mouth 4 (four) times daily as needed for pain, 120 tablet 0   iron sucrose (VENOFER) 20 MG/ML injection Inject 1,000 mg into the vein every 30 (thirty) days.     levothyroxine (SYNTHROID) 200 MCG tablet Take 200 mcg by  mouth daily before breakfast.     ondansetron (ZOFRAN-ODT) 4 MG disintegrating tablet Take 4 mg by mouth every 8 (eight) hours as needed for nausea or vomiting.     No current facility-administered medications for this visit.    SURGICAL HISTORY:  Past Surgical History:  Procedure Laterality Date   A/V FISTULAGRAM Left 09/09/2022   Procedure: A/V Fistulagram;  Surgeon: Renford Dills, MD;  Location: [facility] INVASIVE CV LAB;  Service: Cardiovascular;  Laterality: Left;   ABDOMINAL HYSTERECTOMY     ABDOMINAL SURGERY     AV FISTULA PLACEMENT Left 01/17/2022   Procedure: ARTERIOVENOUS (AV) FISTULA CREATION ( RADIAL CEPHALIC);  Surgeon: Renford Dills,  MD;  Location: [facility] ORS;  Service: Vascular;  Laterality: Left;   CERVICAL BIOPSY  W/ LOOP ELECTRODE EXCISION  2018   COLONOSCOPY N/A 01/30/2022   Procedure: COLONOSCOPY;  Surgeon: Jaynie Collins, DO;  Location: [facility] ENDOSCOPY;  Service: Gastroenterology;  Laterality: N/A;   COLONOSCOPY WITH PROPOFOL N/A 02/01/2022   Procedure: COLONOSCOPY WITH PROPOFOL;  Surgeon: Regis Bill, MD;  Location: [facility] ENDOSCOPY;  Service: Endoscopy;  Laterality: N/A;   DIAGNOSTIC LAPAROSCOPY     due to removal of PD catheters   DIALYSIS/PERMA CATHETER INSERTION N/A 06/09/2022   Procedure: DIALYSIS/PERMA CATHETER INSERTION;  Surgeon: Annice Needy, MD;  Location: [facility] INVASIVE CV LAB;  Service: Cardiovascular;  Laterality: N/A;   DIALYSIS/PERMA CATHETER INSERTION N/A 06/30/2022   Procedure: DIALYSIS/PERMA CATHETER INSERTION;  Surgeon: Cephus Shelling, MD;  Location: [facility] CV [facility];  Service: Cardiovascular;  Laterality: N/A;   ESOPHAGOGASTRODUODENOSCOPY N/A 01/30/2022   Procedure: ESOPHAGOGASTRODUODENOSCOPY (EGD);  Surgeon: Jaynie Collins, DO;  Location: [facility] ENDOSCOPY;  Service: Gastroenterology;  Laterality: N/A;   ESOPHAGOGASTRODUODENOSCOPY (EGD) WITH PROPOFOL N/A 02/01/2022   Procedure: ESOPHAGOGASTRODUODENOSCOPY (EGD) WITH PROPOFOL;  Surgeon: Regis Bill, MD;  Location: [facility] ENDOSCOPY;  Service: Endoscopy;  Laterality: N/A;   RENAL BIOPSY     x 2   TUMOR REMOVAL     right face   UTERINE ARTERY EMBOLIZATION  05/04/2022    REVIEW OF SYSTEMS:   Review of Systems  Constitutional: Negative for appetite change, chills, fatigue, fever and unexpected weight change.  HENT:   Negative for mouth sores, nosebleeds, sore throat and trouble swallowing.   Eyes: Negative for eye problems and icterus.  Respiratory: Negative for cough, hemoptysis, shortness of breath and wheezing.   Cardiovascular: Negative for chest pain and leg swelling.  Gastrointestinal: Negative for abdominal  pain, constipation, diarrhea, nausea and vomiting.  Genitourinary: Negative for bladder incontinence, difficulty urinating, dysuria, frequency and hematuria.   Musculoskeletal: Negative for back pain, gait problem, neck pain and neck stiffness.  Skin: Negative for itching and rash.  Neurological: Negative for dizziness, extremity weakness, gait problem, headaches, light-headedness and seizures.  Hematological: Negative for adenopathy. Does not bruise/bleed easily.  Psychiatric/Behavioral: Negative for confusion, depression and sleep disturbance. The patient is not nervous/anxious.     PHYSICAL EXAMINATION:  Last menstrual period 03/31/2022.  ECOG PERFORMANCE STATUS: {CHL ONC ECOG [id number]  Physical Exam  Constitutional: Oriented to person, place, and time and well-developed, well-nourished, and in no distress. No distress.  HENT:  Head: Normocephalic and atraumatic.  Mouth/Throat: Oropharynx is clear and moist. No oropharyngeal exudate.  Eyes: Conjunctivae are normal. Right eye exhibits no discharge. Left eye exhibits no discharge. No scleral icterus.  Neck: Normal range of motion. Neck supple.  Cardiovascular: Normal rate, regular rhythm, normal heart sounds and intact distal pulses.   Pulmonary/Chest:  Effort normal and breath sounds normal. No respiratory distress. No wheezes. No rales.  Abdominal: Soft. Bowel sounds are normal. Exhibits no distension and no mass. There is no tenderness.  Musculoskeletal: Normal range of motion. Exhibits no edema.  Lymphadenopathy:    No cervical adenopathy.  Neurological: Alert and oriented to person, place, and time. Exhibits normal muscle tone. Gait normal. Coordination normal.  Skin: Skin is warm and dry. No rash noted. Not diaphoretic. No erythema. No pallor.  Psychiatric: Mood, memory and judgment normal.  Vitals reviewed.  LABORATORY DATA: Lab Results  Component Value Date   WBC 8.0 11/27/2022   HGB 7.5 Repeated and verified X2.  (LL) 11/27/2022   HCT 21.4 Repeated and verified X2. (LL) 11/27/2022   MCV 89.8 11/27/2022   PLT 207.0 11/27/2022      Chemistry      Component Value Date/Time   NA 134 (L) 11/27/2022 1339   K 4.4 11/27/2022 1339   CL 94 (L) 11/27/2022 1339   CO2 28 11/27/2022 1339   BUN 58 (H) 11/27/2022 1339   CREATININE 11.09 (HH) 11/27/2022 1339   CREATININE 9.97 (HH) 03/31/2022 1310      Component Value Date/Time   CALCIUM 9.5 11/27/2022 1339   ALKPHOS 107 11/27/2022 1339   AST 10 11/27/2022 1339   AST 9 (L) 03/31/2022 1310   ALT 12 11/27/2022 1339   ALT 13 03/31/2022 1310   BILITOT 0.3 11/27/2022 1339   BILITOT 0.3 03/31/2022 1310       RADIOGRAPHIC STUDIES:  No results found.   ASSESSMENT/PLAN:  No problem-specific Assessment & Plan notes found for this encounter.   No orders of the defined types were placed in this encounter.    I spent {CHL ONC TIME VISIT - LOVFI:[id number]} counseling the patient face to face. The total time spent in the appointment was {CHL ONC TIME VISIT - ZYSAY:[id number]}.  Georges Victorio L Chase Arnall, PA-C 05/29/23

## 2023-06-02 ENCOUNTER — Other Ambulatory Visit (HOSPITAL_COMMUNITY): Payer: Self-pay

## 2023-06-03 ENCOUNTER — Other Ambulatory Visit: Payer: Medicare Other

## 2023-06-03 ENCOUNTER — Ambulatory Visit: Payer: Medicare Other | Admitting: Physician Assistant

## 2023-06-03 NOTE — Progress Notes (Signed)
[facility] OFFICE PROGRESS NOTE  Avanell Shackleton, NP-C [address] 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   CURRENT THERAPY: 1) blood transfusions as needed most recent January 2024. 2) Retacrit injections weekly, 30,000 units self administered at home 3) Iron infusions at home with venofer every 2 weeks, gets at home though her dialysis machine at home.   INTERVAL HISTORY: Holly [name] 37 y.o. female returns to the clinic today for a follow-up visit.  The patient was lost to follow-up and was last seen about a year ago.  The patient has a complicated medical history.  She was followed for her history of anemia.  She has end-stage renal disease for which she sees nephrology.  She does dialysis at home. She was receiving weekly Retacrit 30,000 units weekly at home.  However there was an issue with her shipment this month and she has not had her EPO in a few weeks.  She is hoping to have this overnighted to her to have for tomorrow.  She also self administers Venna for through her dialysis machine at home every 2 weeks.  She gets lab work performed at her dialysis center monthly.  She is expected to find out later this week when she needs her monthly lab work drawn.  He also previously had been struggling with menorrhagia.  She did eventually have a hysterectomy through [facility] in October 2023.  She was previously noting clots in the toilet bowl.  She reports she was previously told that this was secondary to her menstrual cycles.  However, now that she no longer has a uterus, she is still having occasional clots of blood in the toilet which she is concerned is coming from her rectum.  She would like to establish care with GI locally to further evaluate this.  She saw nephrology on 05/06/2023 and  she was rereferred to the clinic for anemia.  Her hemoglobin was reportedly 6.9 at that time.  She has not had a blood transfusion since January 2023.  Besides not receiving her EPO injection this month, she was obtaining pretty good control of her anemia.  She does not take any iron supplements p.o. due to lack of benefit.  Overall, the patient can tell when her hemoglobin is less than 7.  She has some more dyspnea on exertion.  She has resumed working full-time.  She reports fatigue.  Besides the rectal bleeding, she denies any other abnormal bleeding. Denies any NSAID use.  She is here today for evaluation repeat blood work.   MEDICAL HISTORY: Past Medical History:  Diagnosis Date   Anemia    Anxiety    Arthritis    hands   Asthmatic bronchitis 02/11/2023   Blood transfusion without reported diagnosis    Cancer (HCC)    Complication of anesthesia    with Fentanyl she is difficult to awake   Depression    Dialysis patient (HCC)    Dyspnea    due to anemia   End stage renal disease (HCC)    Epilepsy (HCC)    Hashimoto's disease    History of kidney stones    HLD (hyperlipidemia)    Hypertension    Hypothyroidism    Membranoproliferative glomerulonephritis type 2, idiopathic    Pneumonia    Renal disorder    Thyroid disease  ALLERGIES:  is allergic to ciprofloxacin, diphenhydramine hcl, levetiracetam, oxycodone-acetaminophen, rituximab, and tramadol.  MEDICATIONS:  Current Outpatient Medications  Medication Sig Dispense Refill   acyclovir (ZOVIRAX) 200 MG capsule Take 200 mg by mouth 2 (two) times daily as needed (out breaks).     albuterol (VENTOLIN HFA) 108 (90 Base) MCG/ACT inhaler Inhale 2-4 puffs into the lungs every 4 (four) hours as needed for wheezing (or cough). 18 g 0   amLODipine (NORVASC) 10 MG tablet Take 1 tablet (10 mg total) by mouth daily. 30 tablet 0   carvedilol (COREG) 12.5 MG tablet Take 12.5 mg by mouth 2 (two) times daily with a meal.      cetirizine (ZYRTEC) 10 MG tablet Take 10 mg by mouth at bedtime.     cinacalcet (SENSIPAR) 60 MG tablet Take 60 mg by mouth at bedtime.     diclofenac Sodium (VOLTAREN) 1 % GEL Apply 1 application  topically 4 (four) times daily as needed (hand,feet,thighs and back pain).     epoetin alfa (EPOGEN) 10000 UNIT/ML injection Inject 10,000 Units into the skin once a week. saturdays     epoetin alfa (EPOGEN) 20000 UNIT/ML injection Inject 20,000 Units into the skin once a week. Saturdays     escitalopram (LEXAPRO) 10 MG tablet Take 1 tablet (10 mg total) by mouth daily. (Patient not taking: Reported on 04/13/2023) 90 tablet 0   FOSRENOL 1000 MG chewable tablet Chew 1,000 mg by mouth 3 (three) times daily.     HYDROcodone-acetaminophen (NORCO/VICODIN) 5-325 MG tablet Take 1 tablet by mouth 3 (three) times daily as needed.     HYDROcodone-acetaminophen (NORCO/VICODIN) 5-325 MG tablet Take 1 tablet by mouth 4 (four) times daily as needed for pain 120 tablet 0   HYDROcodone-acetaminophen (NORCO/VICODIN) 5-325 MG tablet Take 1 tablet by mouth 4 (four) times daily as needed for pain. 120 tablet 0   HYDROcodone-acetaminophen (NORCO/VICODIN) 5-325 MG tablet Take 1 tablet by mouth 4 (four) times daily as needed for pain. 120 tablet 0   [START ON 07/01/2023] HYDROcodone-acetaminophen (NORCO/VICODIN) 5-325 MG tablet Take 1 tablet by mouth 4 (four) times daily as needed for pain, 120 tablet 0   iron sucrose (VENOFER) 20 MG/ML injection Inject 1,000 mg into the vein every 30 (thirty) days.     levothyroxine (SYNTHROID) 200 MCG tablet Take 200 mcg by mouth daily before breakfast.     ondansetron (ZOFRAN-ODT) 4 MG disintegrating tablet Take 4 mg by mouth every 8 (eight) hours as needed for nausea or vomiting.     No current facility-administered medications for this visit.    SURGICAL HISTORY:  Past Surgical History:  Procedure Laterality Date   A/V FISTULAGRAM Left 09/09/2022   Procedure: A/V Fistulagram;  Surgeon:  Renford Dills, MD;  Location: [facility] INVASIVE CV LAB;  Service: Cardiovascular;  Laterality: Left;   ABDOMINAL HYSTERECTOMY     ABDOMINAL SURGERY     AV FISTULA PLACEMENT Left 01/17/2022   Procedure: ARTERIOVENOUS (AV) FISTULA CREATION ( RADIAL CEPHALIC);  Surgeon: Renford Dills, MD;  Location: [facility] ORS;  Service: Vascular;  Laterality: Left;   CERVICAL BIOPSY  W/ LOOP ELECTRODE EXCISION  2018   COLONOSCOPY N/A 01/30/2022   Procedure: COLONOSCOPY;  Surgeon: Jaynie Collins, DO;  Location: [facility] ENDOSCOPY;  Service: Gastroenterology;  Laterality: N/A;   COLONOSCOPY WITH PROPOFOL N/A 02/01/2022   Procedure: COLONOSCOPY WITH PROPOFOL;  Surgeon: Regis Bill, MD;  Location: [facility] ENDOSCOPY;  Service: Endoscopy;  Laterality: N/A;   DIAGNOSTIC LAPAROSCOPY  due to removal of PD catheters   DIALYSIS/PERMA CATHETER INSERTION N/A 06/09/2022   Procedure: DIALYSIS/PERMA CATHETER INSERTION;  Surgeon: Annice Needy, MD;  Location: [facility] INVASIVE CV LAB;  Service: Cardiovascular;  Laterality: N/A;   DIALYSIS/PERMA CATHETER INSERTION N/A 06/30/2022   Procedure: DIALYSIS/PERMA CATHETER INSERTION;  Surgeon: Cephus Shelling, MD;  Location: [facility] INVASIVE CV LAB;  Service: Cardiovascular;  Laterality: N/A;   ESOPHAGOGASTRODUODENOSCOPY N/A 01/30/2022   Procedure: ESOPHAGOGASTRODUODENOSCOPY (EGD);  Surgeon: Jaynie Collins, DO;  Location: [facility] ENDOSCOPY;  Service: Gastroenterology;  Laterality: N/A;   ESOPHAGOGASTRODUODENOSCOPY (EGD) WITH PROPOFOL N/A 02/01/2022   Procedure: ESOPHAGOGASTRODUODENOSCOPY (EGD) WITH PROPOFOL;  Surgeon: Regis Bill, MD;  Location: [facility] ENDOSCOPY;  Service: Endoscopy;  Laterality: N/A;   RENAL BIOPSY     x 2   TUMOR REMOVAL     right face   UTERINE ARTERY EMBOLIZATION  05/04/2022    REVIEW OF SYSTEMS:   Review of Systems  Constitutional: Positive for fatigue. Negative for appetite change, chills,  and unexpected weight change.  HENT: Negative  for mouth sores, nosebleeds, sore throat and trouble swallowing.   Eyes: Negative for eye problems and icterus.  Respiratory: Positive for dyspnea on exertion. Negative for cough, hemoptysis, and wheezing.   Cardiovascular: Negative for chest pain and leg swelling.  Gastrointestinal: Positive for rectal bleeding. Negative for abdominal pain, constipation, diarrhea, nausea and vomiting.  Genitourinary: Negative for bladder incontinence, difficulty urinating, dysuria, frequency and hematuria.   Musculoskeletal: Negative for back pain, gait problem, neck pain and neck stiffness.  Skin: Negative for itching and rash.  Neurological: Negative for dizziness, extremity weakness, gait problem, headaches, light-headedness and seizures.  Hematological: Negative for adenopathy. Does not bruise/bleed easily.  Psychiatric/Behavioral: Negative for confusion, depression and sleep disturbance. The patient is not nervous/anxious.     PHYSICAL EXAMINATION:  Last menstrual period 03/31/2022.  ECOG PERFORMANCE STATUS: 1  Physical Exam  Constitutional: Oriented to person, place, and time and well-developed, well-nourished, and in no distress.  HENT:  Head: Normocephalic and atraumatic.  Mouth/Throat: Oropharynx is clear and moist. No oropharyngeal exudate.  Eyes: Conjunctivae are normal. Right eye exhibits no discharge. Left eye exhibits no discharge. No scleral icterus.  Neck: Normal range of motion. Neck supple.  Cardiovascular: Normal rate, regular rhythm, normal heart sounds and intact distal pulses.   Pulmonary/Chest: Effort normal and breath sounds normal. No respiratory distress. No wheezes. No rales.  Abdominal: Soft. Bowel sounds are normal. Exhibits no distension and no mass. There is no tenderness.  Musculoskeletal: Normal range of motion. Exhibits no edema.  Lymphadenopathy:    No cervical adenopathy.  Neurological: Alert and oriented to person, place, and time. Exhibits normal muscle tone. Gait  normal. Coordination normal.  Skin: Positive for pallor. Skin is warm and dry. No rash noted. Not diaphoretic. No erythema.  Psychiatric: Mood, memory and judgment normal.  Vitals reviewed.  LABORATORY DATA: Lab Results  Component Value Date   WBC 8.0 11/27/2022   HGB 7.5 Repeated and verified X2. (LL) 11/27/2022   HCT 21.4 Repeated and verified X2. (LL) 11/27/2022   MCV 89.8 11/27/2022   PLT 207.0 11/27/2022      Chemistry      Component Value Date/Time   NA 134 (L) 11/27/2022 1339   K 4.4 11/27/2022 1339   CL 94 (L) 11/27/2022 1339   CO2 28 11/27/2022 1339   BUN 58 (H) 11/27/2022 1339   CREATININE 11.09 (HH) 11/27/2022 1339   CREATININE 9.97 (HH) 03/31/2022 1310  Component Value Date/Time   CALCIUM 9.5 11/27/2022 1339   ALKPHOS 107 11/27/2022 1339   AST 10 11/27/2022 1339   AST 9 (L) 03/31/2022 1310   ALT 12 11/27/2022 1339   ALT 13 03/31/2022 1310   BILITOT 0.3 11/27/2022 1339   BILITOT 0.3 03/31/2022 1310       RADIOGRAPHIC STUDIES:  No results found.   ASSESSMENT/PLAN:  This is a very pleasant 37 year old Caucasian female referred to the clinic for anemia secondary to end-stage renal disease on dialysis and menorrhagia   Her anemia is likely secondary to menorrhagia and her end-stage renal disease.  She was getting EPO injections weekly through her dialysis center.  She also receives IV iron as needed the most recent was given in August 2023 she was lost to follow-up since that time.  She did eventually have a hysterectomy in October 2023.  Patient is here to reestablish care today.  She had a repeat CBC, iron studies, ferritin, sample blood bank, B12, and folate.  Her labs today show piquantly low anemia with a hemoglobin of 6.2.  Her other labs are pending at this time.  We will arrange for a blood transfusion to be administered tomorrow morning at 8 AM.  She is going to have her EPO injections overnighted.  If she ever has trouble obtaining her EPO  injections she can call us and we can see if those can be arranged for in the clinic here.  I am unsure if this would cause issues with insurance authorization.  She is going to continue to get Venofer every 2 weeks.  This is prescribed by the dialysis center and she administers this at home through her dialysis machine.  The patient has lab work performed monthly at her dialysis center.  I told her we could stagger our lab appointments here and perform them monthly which would essentially be having labs performed every 2 weeks between our office and the dialysis center.  Once her labs are more stable we could decrease the frequency of lab visits.  We will likely see her back for follow-up visit with labs in approximately 2 to 3 months..   I have placed a referral to GI regarding her rectal bleeding.  She will continue to follow with Dr. Thedore Mins from the dialysis center.  The patient was advised to call immediately if she has any concerning symptoms in the interval. The patient voices understanding of current disease status and treatment options and is in agreement with the current care plan. All questions were answered. The patient knows to call the clinic with any problems, questions or concerns. We can certainly see the patient much sooner if necessary  No orders of the defined types were placed in this encounter.    The total time spent in the appointment was 30-39 minutes  Adalind Weitz L Kristol Almanzar, PA-C 06/03/23

## 2023-06-05 ENCOUNTER — Other Ambulatory Visit: Payer: Self-pay | Admitting: Physician Assistant

## 2023-06-05 DIAGNOSIS — D649 Anemia, unspecified: Secondary | ICD-10-CM

## 2023-06-08 ENCOUNTER — Inpatient Hospital Stay: Payer: Medicare Other | Attending: Physician Assistant

## 2023-06-08 ENCOUNTER — Other Ambulatory Visit: Payer: Self-pay

## 2023-06-08 ENCOUNTER — Encounter: Payer: Self-pay | Admitting: Physician Assistant

## 2023-06-08 ENCOUNTER — Inpatient Hospital Stay (HOSPITAL_BASED_OUTPATIENT_CLINIC_OR_DEPARTMENT_OTHER): Payer: Medicare Other | Admitting: Physician Assistant

## 2023-06-08 VITALS — BP 129/71 | HR 84 | Temp 98.2°F | Resp 16 | Wt 222.4 lb

## 2023-06-08 DIAGNOSIS — D631 Anemia in chronic kidney disease: Secondary | ICD-10-CM | POA: Insufficient documentation

## 2023-06-08 DIAGNOSIS — K921 Melena: Secondary | ICD-10-CM

## 2023-06-08 DIAGNOSIS — Z90710 Acquired absence of both cervix and uterus: Secondary | ICD-10-CM | POA: Insufficient documentation

## 2023-06-08 DIAGNOSIS — K625 Hemorrhage of anus and rectum: Secondary | ICD-10-CM | POA: Diagnosis not present

## 2023-06-08 DIAGNOSIS — N186 End stage renal disease: Secondary | ICD-10-CM | POA: Insufficient documentation

## 2023-06-08 DIAGNOSIS — D649 Anemia, unspecified: Secondary | ICD-10-CM

## 2023-06-08 DIAGNOSIS — Z992 Dependence on renal dialysis: Secondary | ICD-10-CM | POA: Insufficient documentation

## 2023-06-08 LAB — CBC WITH DIFFERENTIAL (CANCER CENTER ONLY)
Abs Immature Granulocytes: 0.02 10*3/uL (ref 0.00–0.07)
Basophils Absolute: 0 10*3/uL (ref 0.0–0.1)
Basophils Relative: 1 %
Eosinophils Absolute: 0.2 10*3/uL (ref 0.0–0.5)
Eosinophils Relative: 4 %
HCT: 18.9 % — ABNORMAL LOW (ref 36.0–46.0)
Hemoglobin: 6.2 g/dL — CL (ref 12.0–15.0)
Immature Granulocytes: 0 %
Lymphocytes Relative: 11 %
Lymphs Abs: 0.6 10*3/uL — ABNORMAL LOW (ref 0.7–4.0)
MCH: 28.8 pg (ref 26.0–34.0)
MCHC: 32.8 g/dL (ref 30.0–36.0)
MCV: 87.9 fL (ref 80.0–100.0)
Monocytes Absolute: 0.4 10*3/uL (ref 0.1–1.0)
Monocytes Relative: 7 %
Neutro Abs: 4.5 10*3/uL (ref 1.7–7.7)
Neutrophils Relative %: 77 %
Platelet Count: 176 10*3/uL (ref 150–400)
RBC: 2.15 MIL/uL — ABNORMAL LOW (ref 3.87–5.11)
RDW: 15.3 % (ref 11.5–15.5)
WBC Count: 5.8 10*3/uL (ref 4.0–10.5)
nRBC: 0 % (ref 0.0–0.2)

## 2023-06-08 LAB — IRON AND IRON BINDING CAPACITY (CC-WL,HP ONLY)
Iron: 43 ug/dL (ref 28–170)
Saturation Ratios: 16 % (ref 10.4–31.8)
TIBC: 277 ug/dL (ref 250–450)
UIBC: 234 ug/dL (ref 148–442)

## 2023-06-08 LAB — PREPARE RBC (CROSSMATCH)

## 2023-06-08 LAB — FOLATE: Folate: 6.4 ng/mL (ref >5.9)

## 2023-06-08 LAB — VITAMIN B12: Vitamin B-12: 482 pg/mL (ref 180–914)

## 2023-06-08 LAB — FERRITIN: Ferritin: 251 ng/mL (ref 11–307)

## 2023-06-08 LAB — SAMPLE TO BLOOD BANK

## 2023-06-09 ENCOUNTER — Inpatient Hospital Stay: Payer: Medicare Other

## 2023-06-09 DIAGNOSIS — Z90710 Acquired absence of both cervix and uterus: Secondary | ICD-10-CM | POA: Diagnosis not present

## 2023-06-09 DIAGNOSIS — D631 Anemia in chronic kidney disease: Secondary | ICD-10-CM | POA: Diagnosis not present

## 2023-06-09 DIAGNOSIS — K625 Hemorrhage of anus and rectum: Secondary | ICD-10-CM | POA: Diagnosis not present

## 2023-06-09 DIAGNOSIS — D649 Anemia, unspecified: Secondary | ICD-10-CM

## 2023-06-09 DIAGNOSIS — N186 End stage renal disease: Secondary | ICD-10-CM | POA: Diagnosis not present

## 2023-06-09 DIAGNOSIS — Z992 Dependence on renal dialysis: Secondary | ICD-10-CM | POA: Diagnosis not present

## 2023-06-09 MED ORDER — SODIUM CHLORIDE 0.9% IV SOLUTION
250.0000 mL | Freq: Once | INTRAVENOUS | Status: AC
  Administered 2023-06-09: 250 mL via INTRAVENOUS

## 2023-06-09 MED ORDER — ACETAMINOPHEN 325 MG PO TABS
650.0000 mg | ORAL_TABLET | Freq: Once | ORAL | Status: AC
  Administered 2023-06-09: 650 mg via ORAL
  Filled 2023-06-09: qty 2

## 2023-06-09 MED ORDER — DIPHENHYDRAMINE HCL 25 MG PO CAPS
25.0000 mg | ORAL_CAPSULE | Freq: Once | ORAL | Status: AC
  Administered 2023-06-09: 25 mg via ORAL
  Filled 2023-06-09: qty 1

## 2023-06-10 ENCOUNTER — Encounter: Payer: Self-pay | Admitting: Physician Assistant

## 2023-06-10 ENCOUNTER — Encounter (HOSPITAL_COMMUNITY): Payer: BC Managed Care – PPO | Admitting: Psychiatry

## 2023-06-10 NOTE — Progress Notes (Signed)
Patient called for her appointment.  She is driving and like to be scheduled.  She told she needed to go to work today and cannot talk.

## 2023-06-11 ENCOUNTER — Telehealth (HOSPITAL_COMMUNITY): Payer: BLUE CROSS/BLUE SHIELD | Admitting: Psychiatry

## 2023-06-11 ENCOUNTER — Encounter (HOSPITAL_COMMUNITY): Payer: Self-pay | Admitting: Psychiatry

## 2023-06-11 VITALS — Wt 222.0 lb

## 2023-06-11 DIAGNOSIS — F411 Generalized anxiety disorder: Secondary | ICD-10-CM | POA: Diagnosis not present

## 2023-06-11 DIAGNOSIS — F900 Attention-deficit hyperactivity disorder, predominantly inattentive type: Secondary | ICD-10-CM

## 2023-06-11 DIAGNOSIS — F331 Major depressive disorder, recurrent, moderate: Secondary | ICD-10-CM

## 2023-06-11 DIAGNOSIS — F4312 Post-traumatic stress disorder, chronic: Secondary | ICD-10-CM | POA: Diagnosis not present

## 2023-06-11 MED ORDER — ATOMOXETINE HCL 10 MG PO CAPS
10.0000 mg | ORAL_CAPSULE | Freq: Two times a day (BID) | ORAL | 1 refills | Status: DC
Start: 2023-06-11 — End: 2023-09-15
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
## 2023-06-12 LAB — TYPE AND SCREEN
ABO/RH(D): O POS
Antibody Screen: NEGATIVE
Donor AG Type: NEGATIVE
Donor AG Type: NEGATIVE
Unit division: 0
Unit division: 0

## 2023-06-12 LAB — BPAM RBC
Blood Product Expiration Date: 202411132359
Blood Product Expiration Date: 202411142359
ISSUE DATE / TIME: 202410150926
ISSUE DATE / TIME: 202410150926
Unit Type and Rh: 5100
Unit Type and Rh: 5100

## 2023-07-01 ENCOUNTER — Other Ambulatory Visit (HOSPITAL_COMMUNITY): Payer: Self-pay

## 2023-07-13 ENCOUNTER — Other Ambulatory Visit: Payer: Self-pay

## 2023-07-13 ENCOUNTER — Telehealth: Payer: Self-pay

## 2023-07-13 ENCOUNTER — Inpatient Hospital Stay: Payer: Medicare Other | Attending: Physician Assistant

## 2023-07-13 DIAGNOSIS — N186 End stage renal disease: Secondary | ICD-10-CM | POA: Diagnosis present

## 2023-07-13 DIAGNOSIS — N920 Excessive and frequent menstruation with regular cycle: Secondary | ICD-10-CM | POA: Insufficient documentation

## 2023-07-13 DIAGNOSIS — D631 Anemia in chronic kidney disease: Secondary | ICD-10-CM | POA: Diagnosis not present

## 2023-07-13 DIAGNOSIS — D649 Anemia, unspecified: Secondary | ICD-10-CM

## 2023-07-13 LAB — CBC WITH DIFFERENTIAL (CANCER CENTER ONLY)
Abs Immature Granulocytes: 0.03 10*3/uL (ref 0.00–0.07)
Basophils Absolute: 0.1 10*3/uL (ref 0.0–0.1)
Basophils Relative: 1 %
Eosinophils Absolute: 0.3 10*3/uL (ref 0.0–0.5)
Eosinophils Relative: 4 %
HCT: 25.1 % — ABNORMAL LOW (ref 36.0–46.0)
Hemoglobin: 8.4 g/dL — ABNORMAL LOW (ref 12.0–15.0)
Immature Granulocytes: 0 %
Lymphocytes Relative: 14 %
Lymphs Abs: 0.9 10*3/uL (ref 0.7–4.0)
MCH: 29.4 pg (ref 26.0–34.0)
MCHC: 33.5 g/dL (ref 30.0–36.0)
MCV: 87.8 fL (ref 80.0–100.0)
Monocytes Absolute: 0.5 10*3/uL (ref 0.1–1.0)
Monocytes Relative: 7 %
Neutro Abs: 5.1 10*3/uL (ref 1.7–7.7)
Neutrophils Relative %: 74 %
Platelet Count: 163 10*3/uL (ref 150–400)
RBC: 2.86 MIL/uL — ABNORMAL LOW (ref 3.87–5.11)
RDW: 15 % (ref 11.5–15.5)
WBC Count: 6.9 10*3/uL (ref 4.0–10.5)
nRBC: 0 % (ref 0.0–0.2)

## 2023-07-13 LAB — IRON AND IRON BINDING CAPACITY (CC-WL,HP ONLY)
Iron: 62 ug/dL (ref 28–170)
Saturation Ratios: 23 % (ref 10.4–31.8)
TIBC: 272 ug/dL (ref 250–450)
UIBC: 210 ug/dL (ref 148–442)

## 2023-07-13 LAB — SAMPLE TO BLOOD BANK

## 2023-07-13 LAB — FERRITIN: Ferritin: 229 ng/mL (ref 11–307)

## 2023-07-13 NOTE — Telephone Encounter (Signed)
Attempted to reach pt about hgb results of 8.4 and no blood transfusion needed. LVM to return call if any questions.

## 2023-07-16 ENCOUNTER — Encounter (HOSPITAL_COMMUNITY): Payer: Self-pay | Admitting: Psychiatry

## 2023-07-16 ENCOUNTER — Telehealth (HOSPITAL_COMMUNITY): Payer: BLUE CROSS/BLUE SHIELD | Admitting: Psychiatry

## 2023-07-16 VITALS — Wt 211.6 lb

## 2023-07-16 DIAGNOSIS — F900 Attention-deficit hyperactivity disorder, predominantly inattentive type: Secondary | ICD-10-CM

## 2023-07-16 DIAGNOSIS — F4312 Post-traumatic stress disorder, chronic: Secondary | ICD-10-CM

## 2023-07-16 DIAGNOSIS — F411 Generalized anxiety disorder: Secondary | ICD-10-CM

## 2023-07-16 DIAGNOSIS — F331 Major depressive disorder, recurrent, moderate: Secondary | ICD-10-CM
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
## 2023-07-17 ENCOUNTER — Telehealth (HOSPITAL_COMMUNITY): Payer: BC Managed Care – PPO | Admitting: Psychiatry

## 2023-07-25 DIAGNOSIS — Z992 Dependence on renal dialysis: Secondary | ICD-10-CM | POA: Diagnosis not present

## 2023-07-25 DIAGNOSIS — N186 End stage renal disease: Secondary | ICD-10-CM | POA: Diagnosis not present

## 2023-07-26 DIAGNOSIS — E8779 Other fluid overload: Secondary | ICD-10-CM | POA: Diagnosis not present

## 2023-07-26 DIAGNOSIS — D509 Iron deficiency anemia, unspecified: Secondary | ICD-10-CM | POA: Diagnosis not present

## 2023-07-26 DIAGNOSIS — D631 Anemia in chronic kidney disease: Secondary | ICD-10-CM | POA: Diagnosis not present

## 2023-07-26 DIAGNOSIS — N250 Renal osteodystrophy: Secondary | ICD-10-CM | POA: Diagnosis not present

## 2023-07-26 DIAGNOSIS — N186 End stage renal disease: Secondary | ICD-10-CM | POA: Diagnosis not present

## 2023-07-26 DIAGNOSIS — Z992 Dependence on renal dialysis: Secondary | ICD-10-CM | POA: Diagnosis not present

## 2023-07-27 DIAGNOSIS — E8779 Other fluid overload: Secondary | ICD-10-CM | POA: Diagnosis not present

## 2023-07-27 DIAGNOSIS — Z992 Dependence on renal dialysis: Secondary | ICD-10-CM | POA: Diagnosis not present

## 2023-07-27 DIAGNOSIS — N250 Renal osteodystrophy: Secondary | ICD-10-CM | POA: Diagnosis not present

## 2023-07-27 DIAGNOSIS — D509 Iron deficiency anemia, unspecified: Secondary | ICD-10-CM | POA: Diagnosis not present

## 2023-07-27 DIAGNOSIS — N186 End stage renal disease: Secondary | ICD-10-CM | POA: Diagnosis not present

## 2023-07-27 DIAGNOSIS — D631 Anemia in chronic kidney disease: Secondary | ICD-10-CM | POA: Diagnosis not present

## 2023-07-28 ENCOUNTER — Other Ambulatory Visit (HOSPITAL_COMMUNITY): Payer: Self-pay

## 2023-07-28 MED ORDER — HYDROCODONE-ACETAMINOPHEN 5-325 MG PO TABS
1.0000 | ORAL_TABLET | Freq: Four times a day (QID) | ORAL | 0 refills | Status: DC | PRN
  Filled 2023-07-29: qty 120, 30d supply, fill #0

## 2023-07-28 MED ORDER — HYDROCODONE-ACETAMINOPHEN 5-325 MG PO TABS
1.0000 | ORAL_TABLET | Freq: Four times a day (QID) | ORAL | 0 refills | Status: DC | PRN
  Filled 2023-09-29: qty 120, 30d supply, fill #0

## 2023-07-29 ENCOUNTER — Other Ambulatory Visit (HOSPITAL_COMMUNITY): Payer: Self-pay

## 2023-07-29 DIAGNOSIS — N186 End stage renal disease: Secondary | ICD-10-CM | POA: Diagnosis not present

## 2023-07-29 DIAGNOSIS — Z992 Dependence on renal dialysis: Secondary | ICD-10-CM | POA: Diagnosis not present

## 2023-07-29 DIAGNOSIS — N250 Renal osteodystrophy: Secondary | ICD-10-CM | POA: Diagnosis not present

## 2023-07-29 DIAGNOSIS — D631 Anemia in chronic kidney disease: Secondary | ICD-10-CM | POA: Diagnosis not present

## 2023-07-29 DIAGNOSIS — E8779 Other fluid overload: Secondary | ICD-10-CM | POA: Diagnosis not present

## 2023-07-29 DIAGNOSIS — D509 Iron deficiency anemia, unspecified: Secondary | ICD-10-CM | POA: Diagnosis not present

## 2023-07-30 DIAGNOSIS — N186 End stage renal disease: Secondary | ICD-10-CM | POA: Diagnosis not present

## 2023-07-30 DIAGNOSIS — E8779 Other fluid overload: Secondary | ICD-10-CM | POA: Diagnosis not present

## 2023-07-30 DIAGNOSIS — N250 Renal osteodystrophy: Secondary | ICD-10-CM | POA: Diagnosis not present

## 2023-07-30 DIAGNOSIS — Z992 Dependence on renal dialysis: Secondary | ICD-10-CM | POA: Diagnosis not present

## 2023-07-30 DIAGNOSIS — D631 Anemia in chronic kidney disease: Secondary | ICD-10-CM | POA: Diagnosis not present

## 2023-07-30 DIAGNOSIS — D509 Iron deficiency anemia, unspecified: Secondary | ICD-10-CM | POA: Diagnosis not present

## 2023-08-01 DIAGNOSIS — D631 Anemia in chronic kidney disease: Secondary | ICD-10-CM | POA: Diagnosis not present

## 2023-08-01 DIAGNOSIS — D509 Iron deficiency anemia, unspecified: Secondary | ICD-10-CM | POA: Diagnosis not present

## 2023-08-01 DIAGNOSIS — N250 Renal osteodystrophy: Secondary | ICD-10-CM | POA: Diagnosis not present

## 2023-08-01 DIAGNOSIS — E8779 Other fluid overload: Secondary | ICD-10-CM | POA: Diagnosis not present

## 2023-08-01 DIAGNOSIS — N186 End stage renal disease: Secondary | ICD-10-CM | POA: Diagnosis not present

## 2023-08-01 DIAGNOSIS — Z992 Dependence on renal dialysis: Secondary | ICD-10-CM | POA: Diagnosis not present

## 2023-08-02 DIAGNOSIS — E8779 Other fluid overload: Secondary | ICD-10-CM | POA: Diagnosis not present

## 2023-08-02 DIAGNOSIS — D631 Anemia in chronic kidney disease: Secondary | ICD-10-CM | POA: Diagnosis not present

## 2023-08-02 DIAGNOSIS — N250 Renal osteodystrophy: Secondary | ICD-10-CM | POA: Diagnosis not present

## 2023-08-02 DIAGNOSIS — Z992 Dependence on renal dialysis: Secondary | ICD-10-CM | POA: Diagnosis not present

## 2023-08-02 DIAGNOSIS — D509 Iron deficiency anemia, unspecified: Secondary | ICD-10-CM | POA: Diagnosis not present

## 2023-08-02 DIAGNOSIS — N186 End stage renal disease: Secondary | ICD-10-CM | POA: Diagnosis not present

## 2023-08-04 DIAGNOSIS — N250 Renal osteodystrophy: Secondary | ICD-10-CM | POA: Diagnosis not present

## 2023-08-04 DIAGNOSIS — E8779 Other fluid overload: Secondary | ICD-10-CM | POA: Diagnosis not present

## 2023-08-04 DIAGNOSIS — D509 Iron deficiency anemia, unspecified: Secondary | ICD-10-CM | POA: Diagnosis not present

## 2023-08-04 DIAGNOSIS — N186 End stage renal disease: Secondary | ICD-10-CM | POA: Diagnosis not present

## 2023-08-04 DIAGNOSIS — Z992 Dependence on renal dialysis: Secondary | ICD-10-CM | POA: Diagnosis not present

## 2023-08-04 DIAGNOSIS — D631 Anemia in chronic kidney disease: Secondary | ICD-10-CM | POA: Diagnosis not present

## 2023-08-05 DIAGNOSIS — N186 End stage renal disease: Secondary | ICD-10-CM | POA: Diagnosis not present

## 2023-08-05 DIAGNOSIS — Z992 Dependence on renal dialysis: Secondary | ICD-10-CM | POA: Diagnosis not present

## 2023-08-05 DIAGNOSIS — D509 Iron deficiency anemia, unspecified: Secondary | ICD-10-CM | POA: Diagnosis not present

## 2023-08-05 DIAGNOSIS — E8779 Other fluid overload: Secondary | ICD-10-CM | POA: Diagnosis not present

## 2023-08-05 DIAGNOSIS — D631 Anemia in chronic kidney disease: Secondary | ICD-10-CM | POA: Diagnosis not present

## 2023-08-05 DIAGNOSIS — N250 Renal osteodystrophy: Secondary | ICD-10-CM | POA: Diagnosis not present

## 2023-08-06 DIAGNOSIS — N250 Renal osteodystrophy: Secondary | ICD-10-CM | POA: Diagnosis not present

## 2023-08-06 DIAGNOSIS — Z992 Dependence on renal dialysis: Secondary | ICD-10-CM | POA: Diagnosis not present

## 2023-08-06 DIAGNOSIS — E8779 Other fluid overload: Secondary | ICD-10-CM | POA: Diagnosis not present

## 2023-08-06 DIAGNOSIS — D509 Iron deficiency anemia, unspecified: Secondary | ICD-10-CM | POA: Diagnosis not present

## 2023-08-06 DIAGNOSIS — N186 End stage renal disease: Secondary | ICD-10-CM | POA: Diagnosis not present

## 2023-08-06 DIAGNOSIS — D631 Anemia in chronic kidney disease: Secondary | ICD-10-CM | POA: Diagnosis not present

## 2023-08-08 DIAGNOSIS — E8779 Other fluid overload: Secondary | ICD-10-CM | POA: Diagnosis not present

## 2023-08-08 DIAGNOSIS — D509 Iron deficiency anemia, unspecified: Secondary | ICD-10-CM | POA: Diagnosis not present

## 2023-08-08 DIAGNOSIS — N186 End stage renal disease: Secondary | ICD-10-CM | POA: Diagnosis not present

## 2023-08-08 DIAGNOSIS — Z992 Dependence on renal dialysis: Secondary | ICD-10-CM | POA: Diagnosis not present

## 2023-08-08 DIAGNOSIS — N250 Renal osteodystrophy: Secondary | ICD-10-CM | POA: Diagnosis not present

## 2023-08-08 DIAGNOSIS — D631 Anemia in chronic kidney disease: Secondary | ICD-10-CM | POA: Diagnosis not present

## 2023-08-09 DIAGNOSIS — N250 Renal osteodystrophy: Secondary | ICD-10-CM | POA: Diagnosis not present

## 2023-08-09 DIAGNOSIS — D509 Iron deficiency anemia, unspecified: Secondary | ICD-10-CM | POA: Diagnosis not present

## 2023-08-09 DIAGNOSIS — D631 Anemia in chronic kidney disease: Secondary | ICD-10-CM | POA: Diagnosis not present

## 2023-08-09 DIAGNOSIS — Z992 Dependence on renal dialysis: Secondary | ICD-10-CM | POA: Diagnosis not present

## 2023-08-09 DIAGNOSIS — N186 End stage renal disease: Secondary | ICD-10-CM | POA: Diagnosis not present

## 2023-08-09 DIAGNOSIS — E8779 Other fluid overload: Secondary | ICD-10-CM | POA: Diagnosis not present

## 2023-08-10 ENCOUNTER — Inpatient Hospital Stay: Payer: Medicare Other | Admitting: Internal Medicine

## 2023-08-10 ENCOUNTER — Inpatient Hospital Stay: Payer: Medicare Other | Attending: Physician Assistant

## 2023-08-10 ENCOUNTER — Telehealth: Payer: Self-pay | Admitting: Medical Oncology

## 2023-08-10 NOTE — Telephone Encounter (Signed)
Appt No show.I LVM to return my call to r/s her appt. Schedule message sent.

## 2023-08-11 ENCOUNTER — Telehealth: Payer: Self-pay | Admitting: Internal Medicine

## 2023-08-11 DIAGNOSIS — D631 Anemia in chronic kidney disease: Secondary | ICD-10-CM | POA: Diagnosis not present

## 2023-08-11 DIAGNOSIS — N186 End stage renal disease: Secondary | ICD-10-CM | POA: Diagnosis not present

## 2023-08-11 DIAGNOSIS — E8779 Other fluid overload: Secondary | ICD-10-CM | POA: Diagnosis not present

## 2023-08-11 DIAGNOSIS — D509 Iron deficiency anemia, unspecified: Secondary | ICD-10-CM | POA: Diagnosis not present

## 2023-08-11 DIAGNOSIS — N250 Renal osteodystrophy: Secondary | ICD-10-CM | POA: Diagnosis not present

## 2023-08-11 DIAGNOSIS — Z992 Dependence on renal dialysis: Secondary | ICD-10-CM | POA: Diagnosis not present

## 2023-08-11 NOTE — Telephone Encounter (Signed)
Patient is aware of updated appointment times/dates for January appointment

## 2023-08-12 DIAGNOSIS — Z992 Dependence on renal dialysis: Secondary | ICD-10-CM | POA: Diagnosis not present

## 2023-08-12 DIAGNOSIS — D509 Iron deficiency anemia, unspecified: Secondary | ICD-10-CM | POA: Diagnosis not present

## 2023-08-12 DIAGNOSIS — D631 Anemia in chronic kidney disease: Secondary | ICD-10-CM | POA: Diagnosis not present

## 2023-08-12 DIAGNOSIS — N186 End stage renal disease: Secondary | ICD-10-CM | POA: Diagnosis not present

## 2023-08-12 DIAGNOSIS — E8779 Other fluid overload: Secondary | ICD-10-CM | POA: Diagnosis not present

## 2023-08-12 DIAGNOSIS — N250 Renal osteodystrophy: Secondary | ICD-10-CM | POA: Diagnosis not present

## 2023-08-13 DIAGNOSIS — N186 End stage renal disease: Secondary | ICD-10-CM | POA: Diagnosis not present

## 2023-08-13 DIAGNOSIS — Z992 Dependence on renal dialysis: Secondary | ICD-10-CM | POA: Diagnosis not present

## 2023-08-13 DIAGNOSIS — N250 Renal osteodystrophy: Secondary | ICD-10-CM | POA: Diagnosis not present

## 2023-08-13 DIAGNOSIS — D509 Iron deficiency anemia, unspecified: Secondary | ICD-10-CM | POA: Diagnosis not present

## 2023-08-13 DIAGNOSIS — D631 Anemia in chronic kidney disease: Secondary | ICD-10-CM | POA: Diagnosis not present

## 2023-08-13 DIAGNOSIS — E8779 Other fluid overload: Secondary | ICD-10-CM | POA: Diagnosis not present

## 2023-08-15 DIAGNOSIS — N250 Renal osteodystrophy: Secondary | ICD-10-CM | POA: Diagnosis not present

## 2023-08-15 DIAGNOSIS — N186 End stage renal disease: Secondary | ICD-10-CM | POA: Diagnosis not present

## 2023-08-15 DIAGNOSIS — D509 Iron deficiency anemia, unspecified: Secondary | ICD-10-CM | POA: Diagnosis not present

## 2023-08-15 DIAGNOSIS — Z992 Dependence on renal dialysis: Secondary | ICD-10-CM | POA: Diagnosis not present

## 2023-08-15 DIAGNOSIS — E8779 Other fluid overload: Secondary | ICD-10-CM | POA: Diagnosis not present

## 2023-08-15 DIAGNOSIS — D631 Anemia in chronic kidney disease: Secondary | ICD-10-CM | POA: Diagnosis not present

## 2023-08-17 DIAGNOSIS — N250 Renal osteodystrophy: Secondary | ICD-10-CM | POA: Diagnosis not present

## 2023-08-17 DIAGNOSIS — D631 Anemia in chronic kidney disease: Secondary | ICD-10-CM | POA: Diagnosis not present

## 2023-08-17 DIAGNOSIS — D509 Iron deficiency anemia, unspecified: Secondary | ICD-10-CM | POA: Diagnosis not present

## 2023-08-17 DIAGNOSIS — N186 End stage renal disease: Secondary | ICD-10-CM | POA: Diagnosis not present

## 2023-08-17 DIAGNOSIS — Z992 Dependence on renal dialysis: Secondary | ICD-10-CM | POA: Diagnosis not present

## 2023-08-17 DIAGNOSIS — E8779 Other fluid overload: Secondary | ICD-10-CM | POA: Diagnosis not present

## 2023-08-18 DIAGNOSIS — D509 Iron deficiency anemia, unspecified: Secondary | ICD-10-CM | POA: Diagnosis not present

## 2023-08-18 DIAGNOSIS — E8779 Other fluid overload: Secondary | ICD-10-CM | POA: Diagnosis not present

## 2023-08-18 DIAGNOSIS — N250 Renal osteodystrophy: Secondary | ICD-10-CM | POA: Diagnosis not present

## 2023-08-18 DIAGNOSIS — Z992 Dependence on renal dialysis: Secondary | ICD-10-CM | POA: Diagnosis not present

## 2023-08-18 DIAGNOSIS — D631 Anemia in chronic kidney disease: Secondary | ICD-10-CM | POA: Diagnosis not present

## 2023-08-18 DIAGNOSIS — N186 End stage renal disease: Secondary | ICD-10-CM | POA: Diagnosis not present

## 2023-08-19 DIAGNOSIS — N250 Renal osteodystrophy: Secondary | ICD-10-CM | POA: Diagnosis not present

## 2023-08-19 DIAGNOSIS — E8779 Other fluid overload: Secondary | ICD-10-CM | POA: Diagnosis not present

## 2023-08-19 DIAGNOSIS — D509 Iron deficiency anemia, unspecified: Secondary | ICD-10-CM | POA: Diagnosis not present

## 2023-08-19 DIAGNOSIS — Z992 Dependence on renal dialysis: Secondary | ICD-10-CM | POA: Diagnosis not present

## 2023-08-19 DIAGNOSIS — D631 Anemia in chronic kidney disease: Secondary | ICD-10-CM | POA: Diagnosis not present

## 2023-08-19 DIAGNOSIS — N186 End stage renal disease: Secondary | ICD-10-CM | POA: Diagnosis not present

## 2023-08-20 DIAGNOSIS — D631 Anemia in chronic kidney disease: Secondary | ICD-10-CM | POA: Diagnosis not present

## 2023-08-20 DIAGNOSIS — N186 End stage renal disease: Secondary | ICD-10-CM | POA: Diagnosis not present

## 2023-08-20 DIAGNOSIS — N250 Renal osteodystrophy: Secondary | ICD-10-CM | POA: Diagnosis not present

## 2023-08-20 DIAGNOSIS — D509 Iron deficiency anemia, unspecified: Secondary | ICD-10-CM | POA: Diagnosis not present

## 2023-08-20 DIAGNOSIS — E8779 Other fluid overload: Secondary | ICD-10-CM | POA: Diagnosis not present

## 2023-08-20 DIAGNOSIS — Z992 Dependence on renal dialysis: Secondary | ICD-10-CM | POA: Diagnosis not present

## 2023-08-21 DIAGNOSIS — N250 Renal osteodystrophy: Secondary | ICD-10-CM | POA: Diagnosis not present

## 2023-08-21 DIAGNOSIS — Z992 Dependence on renal dialysis: Secondary | ICD-10-CM | POA: Diagnosis not present

## 2023-08-21 DIAGNOSIS — N186 End stage renal disease: Secondary | ICD-10-CM | POA: Diagnosis not present

## 2023-08-21 DIAGNOSIS — D509 Iron deficiency anemia, unspecified: Secondary | ICD-10-CM | POA: Diagnosis not present

## 2023-08-21 DIAGNOSIS — D631 Anemia in chronic kidney disease: Secondary | ICD-10-CM | POA: Diagnosis not present

## 2023-08-21 DIAGNOSIS — E8779 Other fluid overload: Secondary | ICD-10-CM | POA: Diagnosis not present

## 2023-08-23 DIAGNOSIS — Z992 Dependence on renal dialysis: Secondary | ICD-10-CM | POA: Diagnosis not present

## 2023-08-23 DIAGNOSIS — D631 Anemia in chronic kidney disease: Secondary | ICD-10-CM | POA: Diagnosis not present

## 2023-08-23 DIAGNOSIS — N250 Renal osteodystrophy: Secondary | ICD-10-CM | POA: Diagnosis not present

## 2023-08-23 DIAGNOSIS — D509 Iron deficiency anemia, unspecified: Secondary | ICD-10-CM | POA: Diagnosis not present

## 2023-08-23 DIAGNOSIS — N186 End stage renal disease: Secondary | ICD-10-CM | POA: Diagnosis not present

## 2023-08-23 DIAGNOSIS — E8779 Other fluid overload: Secondary | ICD-10-CM | POA: Diagnosis not present

## 2023-08-24 DIAGNOSIS — Z992 Dependence on renal dialysis: Secondary | ICD-10-CM | POA: Diagnosis not present

## 2023-08-24 DIAGNOSIS — D509 Iron deficiency anemia, unspecified: Secondary | ICD-10-CM | POA: Diagnosis not present

## 2023-08-24 DIAGNOSIS — D631 Anemia in chronic kidney disease: Secondary | ICD-10-CM | POA: Diagnosis not present

## 2023-08-24 DIAGNOSIS — E8779 Other fluid overload: Secondary | ICD-10-CM | POA: Diagnosis not present

## 2023-08-24 DIAGNOSIS — N250 Renal osteodystrophy: Secondary | ICD-10-CM | POA: Diagnosis not present

## 2023-08-24 DIAGNOSIS — N186 End stage renal disease: Secondary | ICD-10-CM | POA: Diagnosis not present

## 2023-08-25 DIAGNOSIS — E8779 Other fluid overload: Secondary | ICD-10-CM | POA: Diagnosis not present

## 2023-08-25 DIAGNOSIS — N186 End stage renal disease: Secondary | ICD-10-CM | POA: Diagnosis not present

## 2023-08-25 DIAGNOSIS — D631 Anemia in chronic kidney disease: Secondary | ICD-10-CM | POA: Diagnosis not present

## 2023-08-25 DIAGNOSIS — N250 Renal osteodystrophy: Secondary | ICD-10-CM | POA: Diagnosis not present

## 2023-08-25 DIAGNOSIS — D509 Iron deficiency anemia, unspecified: Secondary | ICD-10-CM | POA: Diagnosis not present

## 2023-08-25 DIAGNOSIS — Z992 Dependence on renal dialysis: Secondary | ICD-10-CM | POA: Diagnosis not present

## 2023-08-26 ENCOUNTER — Other Ambulatory Visit (HOSPITAL_COMMUNITY): Payer: Self-pay

## 2023-08-28 ENCOUNTER — Other Ambulatory Visit (HOSPITAL_COMMUNITY): Payer: Self-pay

## 2023-08-28 ENCOUNTER — Encounter: Payer: Self-pay | Admitting: Physician Assistant

## 2023-08-28 MED ORDER — HYDROCODONE-ACETAMINOPHEN 5-325 MG PO TABS
1.0000 | ORAL_TABLET | Freq: Four times a day (QID) | ORAL | 0 refills | Status: DC | PRN
  Filled 2023-08-28 (×2): qty 120, 30d supply, fill #0

## 2023-09-07 ENCOUNTER — Encounter: Payer: Self-pay | Admitting: Physician Assistant

## 2023-09-14 ENCOUNTER — Inpatient Hospital Stay: Payer: Medicare Other | Attending: Physician Assistant

## 2023-09-14 ENCOUNTER — Inpatient Hospital Stay (HOSPITAL_BASED_OUTPATIENT_CLINIC_OR_DEPARTMENT_OTHER): Payer: Medicare Other | Admitting: Internal Medicine

## 2023-09-14 VITALS — BP 121/74 | HR 89 | Temp 97.6°F | Resp 17 | Ht 67.0 in | Wt 215.9 lb

## 2023-09-14 DIAGNOSIS — N920 Excessive and frequent menstruation with regular cycle: Secondary | ICD-10-CM | POA: Insufficient documentation

## 2023-09-14 DIAGNOSIS — D500 Iron deficiency anemia secondary to blood loss (chronic): Secondary | ICD-10-CM | POA: Diagnosis not present

## 2023-09-14 DIAGNOSIS — N186 End stage renal disease: Secondary | ICD-10-CM | POA: Diagnosis not present

## 2023-09-14 DIAGNOSIS — D649 Anemia, unspecified: Secondary | ICD-10-CM

## 2023-09-14 DIAGNOSIS — Z992 Dependence on renal dialysis: Secondary | ICD-10-CM | POA: Diagnosis not present

## 2023-09-14 DIAGNOSIS — D638 Anemia in other chronic diseases classified elsewhere: Secondary | ICD-10-CM

## 2023-09-14 DIAGNOSIS — Z90710 Acquired absence of both cervix and uterus: Secondary | ICD-10-CM | POA: Diagnosis not present

## 2023-09-14 DIAGNOSIS — D631 Anemia in chronic kidney disease: Secondary | ICD-10-CM | POA: Diagnosis not present

## 2023-09-14 LAB — CBC WITH DIFFERENTIAL (CANCER CENTER ONLY)
Abs Immature Granulocytes: 0.05 10*3/uL (ref 0.00–0.07)
Basophils Absolute: 0 10*3/uL (ref 0.0–0.1)
Basophils Relative: 0 %
Eosinophils Absolute: 0.3 10*3/uL (ref 0.0–0.5)
Eosinophils Relative: 3 %
HCT: 31.4 % — ABNORMAL LOW (ref 36.0–46.0)
Hemoglobin: 10.5 g/dL — ABNORMAL LOW (ref 12.0–15.0)
Immature Granulocytes: 1 %
Lymphocytes Relative: 10 %
Lymphs Abs: 1 10*3/uL (ref 0.7–4.0)
MCH: 30 pg (ref 26.0–34.0)
MCHC: 33.4 g/dL (ref 30.0–36.0)
MCV: 89.7 fL (ref 80.0–100.0)
Monocytes Absolute: 0.4 10*3/uL (ref 0.1–1.0)
Monocytes Relative: 4 %
Neutro Abs: 8.1 10*3/uL — ABNORMAL HIGH (ref 1.7–7.7)
Neutrophils Relative %: 82 %
Platelet Count: 184 10*3/uL (ref 150–400)
RBC: 3.5 MIL/uL — ABNORMAL LOW (ref 3.87–5.11)
RDW: 15.5 % (ref 11.5–15.5)
WBC Count: 9.9 10*3/uL (ref 4.0–10.5)
nRBC: 0 % (ref 0.0–0.2)

## 2023-09-14 LAB — IRON AND IRON BINDING CAPACITY (CC-WL,HP ONLY)
Iron: 34 ug/dL (ref 28–170)
Saturation Ratios: 14 % (ref 10.4–31.8)
TIBC: 238 ug/dL — ABNORMAL LOW (ref 250–450)
UIBC: 204 ug/dL (ref 148–442)

## 2023-09-14 LAB — SAMPLE TO BLOOD BANK

## 2023-09-14 LAB — FERRITIN: Ferritin: 105 ng/mL (ref 11–307)

## 2023-09-14 NOTE — Progress Notes (Signed)
[facility] [phone]   [phone]  OFFICE PROGRESS NOTE  Avanell Shackleton, NP-C [address] 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    CURRENT THERAPY: 1) blood transfusions as needed most recent January 2024. 2) Retacrit injections weekly, 30,000 units self administered at home 3) Iron infusions at home with venofer every 2 weeks, gets at home though her dialysis machine at home.  INTERVAL HISTORY: Holly [name] 38 y.o. female returns to the clinic today for follow-up visit.Discussed the use of AI scribe software for clinical note transcription with the patient, who gave verbal consent to proceed.  History of Present Illness   The patient, a 38 year old with a history of iron deficiency anemia secondary to menorrhagia, underwent a hysterectomy in October 2024. Since the procedure, she reports feeling significantly better. Prior to the hysterectomy, her hemoglobin levels were consistently in the 6 range, but she now reports feeling as though she is back in the normal range, despite occasional dips.  One such dip was attributed to a loss of blood during a home hemodialysis session, a treatment she regularly administers herself. She reports that her most recent labs showed a hemoglobin level of 10.5 and a hematocrit of 31.4.  In terms of treatment, she is currently on Epogen, with a dosage of 20,000 units every two weeks, prescribed by her nephrologist. She also takes Venofer, 1000 units once a month. She acknowledges the ongoing need for these supplements due to her chronic kidney disease, which contributes to her anemia.  Overall, the patient reports no current complaints and denies experiencing any nausea or vomiting.       MEDICAL HISTORY: Past Medical  History:  Diagnosis Date   Anemia    Anxiety    Arthritis    hands   Asthmatic bronchitis 02/11/2023   Blood transfusion without reported diagnosis    Cancer (HCC)    Complication of anesthesia    with Fentanyl she is difficult to awake   Depression    Dialysis patient (HCC)    Dyspnea    due to anemia   End stage renal disease (HCC)    Epilepsy (HCC)    Hashimoto's disease    History of kidney stones    HLD (hyperlipidemia)    Hypertension    Hypothyroidism    Membranoproliferative glomerulonephritis type 2, idiopathic    Pneumonia    Renal disorder    Thyroid disease     ALLERGIES:  is allergic to ciprofloxacin, diphenhydramine hcl, levetiracetam, oxycodone-acetaminophen, rituximab, and tramadol.  MEDICATIONS:  Current Outpatient Medications  Medication Sig Dispense Refill   acyclovir (ZOVIRAX) 200 MG capsule Take 200 mg by mouth 2 (two) times daily as needed (out breaks).     albuterol (VENTOLIN HFA) 108 (90 Base) MCG/ACT inhaler Inhale 2-4 puffs into the lungs every 4 (four) hours as needed for wheezing (or cough). 18 g 0   amLODipine (NORVASC) 10 MG tablet Take 1 tablet (10 mg total) by mouth daily. 30 tablet 0   atomoxetine (STRATTERA) 10 MG capsule Take 1 capsule (10 mg total) by mouth 2 (two) times daily with a meal. 60 capsule 1   carvedilol (COREG) 12.5 MG tablet Take 12.5 mg by mouth 2 (two) times daily with a meal.     cetirizine (ZYRTEC) 10  MG tablet Take 10 mg by mouth at bedtime.     cinacalcet (SENSIPAR) 60 MG tablet Take 60 mg by mouth at bedtime.     diclofenac Sodium (VOLTAREN) 1 % GEL Apply 1 application  topically 4 (four) times daily as needed (hand,feet,thighs and back pain).     epoetin alfa (EPOGEN) 10000 UNIT/ML injection Inject 10,000 Units into the skin once a week. saturdays     epoetin alfa (EPOGEN) 20000 UNIT/ML injection Inject 20,000 Units into the skin once a week. Saturdays     escitalopram (LEXAPRO) 10 MG tablet Take 1 tablet (10 mg  total) by mouth daily. (Patient not taking: Reported on 04/13/2023) 90 tablet 0   FOSRENOL 1000 MG chewable tablet Chew 1,000 mg by mouth 3 (three) times daily.     HYDROcodone-acetaminophen (NORCO/VICODIN) 5-325 MG tablet Take 1 tablet by mouth 3 (three) times daily as needed.     HYDROcodone-acetaminophen (NORCO/VICODIN) 5-325 MG tablet Take 1 tablet by mouth 4 (four) times daily as needed for pain 120 tablet 0   HYDROcodone-acetaminophen (NORCO/VICODIN) 5-325 MG tablet Take 1 tablet by mouth 4 (four) times daily as needed for pain. 120 tablet 0   HYDROcodone-acetaminophen (NORCO/VICODIN) 5-325 MG tablet Take 1 tablet by mouth 4 (four) times daily as needed for pain. 120 tablet 0   HYDROcodone-acetaminophen (NORCO/VICODIN) 5-325 MG tablet Take 1 tablet by mouth 4 (four) times daily as needed for pain 120 tablet 0   HYDROcodone-acetaminophen (NORCO/VICODIN) 5-325 MG tablet Take 1 tablet by mouth 4 (four) times daily as needed for pain. 120 tablet 0   HYDROcodone-acetaminophen (NORCO/VICODIN) 5-325 MG tablet Take 1 tablet by mouth 4 (four) times daily as needed for pain. 120 tablet 0   iron sucrose (VENOFER) 20 MG/ML injection Inject 1,000 mg into the vein every 30 (thirty) days.     levothyroxine (SYNTHROID) 200 MCG tablet Take 200 mcg by mouth daily before breakfast.     ondansetron (ZOFRAN-ODT) 4 MG disintegrating tablet Take 4 mg by mouth every 8 (eight) hours as needed for nausea or vomiting.     No current facility-administered medications for this visit.    SURGICAL HISTORY:  Past Surgical History:  Procedure Laterality Date   A/V FISTULAGRAM Left 09/09/2022   Procedure: A/V Fistulagram;  Surgeon: Renford Dills, MD;  Location: [facility] INVASIVE CV LAB;  Service: Cardiovascular;  Laterality: Left;   ABDOMINAL HYSTERECTOMY     ABDOMINAL SURGERY     AV FISTULA PLACEMENT Left 01/17/2022   Procedure: ARTERIOVENOUS (AV) FISTULA CREATION ( RADIAL CEPHALIC);  Surgeon: Renford Dills, MD;   Location: [facility] ORS;  Service: Vascular;  Laterality: Left;   CERVICAL BIOPSY  W/ LOOP ELECTRODE EXCISION  2018   COLONOSCOPY N/A 01/30/2022   Procedure: COLONOSCOPY;  Surgeon: Jaynie Collins, DO;  Location: [facility] ENDOSCOPY;  Service: Gastroenterology;  Laterality: N/A;   COLONOSCOPY WITH PROPOFOL N/A 02/01/2022   Procedure: COLONOSCOPY WITH PROPOFOL;  Surgeon: Regis Bill, MD;  Location: [facility] ENDOSCOPY;  Service: Endoscopy;  Laterality: N/A;   DIAGNOSTIC LAPAROSCOPY     due to removal of PD catheters   DIALYSIS/PERMA CATHETER INSERTION N/A 06/09/2022   Procedure: DIALYSIS/PERMA CATHETER INSERTION;  Surgeon: Annice Needy, MD;  Location: [facility] INVASIVE CV LAB;  Service: Cardiovascular;  Laterality: N/A;   DIALYSIS/PERMA CATHETER INSERTION N/A 06/30/2022   Procedure: DIALYSIS/PERMA CATHETER INSERTION;  Surgeon: Cephus Shelling, MD;  Location: [facility] INVASIVE CV LAB;  Service: Cardiovascular;  Laterality: N/A;   ESOPHAGOGASTRODUODENOSCOPY N/A 01/30/2022  Procedure: ESOPHAGOGASTRODUODENOSCOPY (EGD);  Surgeon: Jaynie Collins, DO;  Location: [facility] ENDOSCOPY;  Service: Gastroenterology;  Laterality: N/A;   ESOPHAGOGASTRODUODENOSCOPY (EGD) WITH PROPOFOL N/A 02/01/2022   Procedure: ESOPHAGOGASTRODUODENOSCOPY (EGD) WITH PROPOFOL;  Surgeon: Regis Bill, MD;  Location: [facility] ENDOSCOPY;  Service: Endoscopy;  Laterality: N/A;   RENAL BIOPSY     x 2   TUMOR REMOVAL     right face   UTERINE ARTERY EMBOLIZATION  05/04/2022    REVIEW OF SYSTEMS:  A comprehensive review of systems was negative.   PHYSICAL EXAMINATION: General appearance: alert, cooperative, and no distress Head: Normocephalic, without obvious abnormality, atraumatic Neck: no adenopathy, no JVD, supple, symmetrical, trachea midline, and thyroid not enlarged, symmetric, no tenderness/mass/nodules Lymph nodes: Cervical, supraclavicular, and axillary nodes normal. Resp: clear to auscultation bilaterally Back:  symmetric, no curvature. ROM normal. No CVA tenderness. Cardio: regular rate and rhythm, S1, S2 normal, no murmur, click, rub or gallop GI: soft, non-tender; bowel sounds normal; no masses,  no organomegaly Extremities: extremities normal, atraumatic, no cyanosis or edema  ECOG PERFORMANCE STATUS: 1 - Symptomatic but completely ambulatory  Blood pressure 121/74, pulse 89, temperature 97.6 F (36.4 C), temperature source Temporal, resp. rate 17, height 5\' 7"  (1.702 m), weight 215 lb 14.4 oz (97.9 kg), last menstrual period 03/31/2022, SpO2 100%.  LABORATORY DATA: Lab Results  Component Value Date   WBC 9.9 09/14/2023   HGB 10.5 (L) 09/14/2023   HCT 31.4 (L) 09/14/2023   MCV 89.7 09/14/2023   PLT 184 09/14/2023      Chemistry      Component Value Date/Time   NA 134 (L) 11/27/2022 1339   K 4.4 11/27/2022 1339   CL 94 (L) 11/27/2022 1339   CO2 28 11/27/2022 1339   BUN 58 (H) 11/27/2022 1339   CREATININE 11.09 (HH) 11/27/2022 1339   CREATININE 9.97 (HH) 03/31/2022 1310      Component Value Date/Time   CALCIUM 9.5 11/27/2022 1339   ALKPHOS 107 11/27/2022 1339   AST 10 11/27/2022 1339   AST 9 (L) 03/31/2022 1310   ALT 12 11/27/2022 1339   ALT 13 03/31/2022 1310   BILITOT 0.3 11/27/2022 1339   BILITOT 0.3 03/31/2022 1310       RADIOGRAPHIC STUDIES: No results found.  ASSESSMENT AND PLAN: This is a very pleasant 38 years old white female with anemia of end-stage renal disease and menorrhagia status post hysterectomy in October 2024.  Iron Deficiency Anemia Iron deficiency anemia secondary to menorrhagia, improved post-hysterectomy in October 2024. Hemoglobin increased from 6.2 in October 2024 to 10.5 currently. Anemia influenced by chronic kidney disease and home hemodialysis. Patient on Epogen 20,000 units biweekly and Venofer 1,000 units monthly. Discussed need for continued iron supplementation and Epogen due to chronic kidney disease. Patient reports no current complaints  and feels better post-hysterectomy. - Continue Epogen 20,000 units biweekly - Continue Venofer 1,000 units monthly - Monitor hemoglobin and hematocrit levels - Follow up with nephrologist regularly - Schedule follow-up visit in six months for blood work  Chronic Kidney Disease Chronic kidney disease requiring home hemodialysis. Anemia management complicated by this condition. Patient performs home hemodialysis and occasionally experiences blood loss due to inability to return blood from the machine. - Continue current dialysis regimen - Follow up with nephrologist regularly  Follow-up - Schedule follow-up visit in six months - Perform blood work at follow-up visit.   The patient was advised to call immediately if she has any other concerning symptoms in the interval.  The patient voices understanding of current disease status and treatment options and is in agreement with the current care plan.  All questions were answered. The patient knows to call the clinic with any problems, questions or concerns. We can certainly see the patient much sooner if necessary.  The total time spent in the appointment was 20 minutes.  Disclaimer: This note was dictated with voice recognition software. Similar sounding words can inadvertently be transcribed and may not be corrected upon review.

## 2023-09-15 ENCOUNTER — Telehealth (HOSPITAL_COMMUNITY): Payer: Medicare Other | Admitting: Psychiatry

## 2023-09-15 ENCOUNTER — Encounter: Payer: Self-pay | Admitting: Physician Assistant

## 2023-09-15 ENCOUNTER — Encounter (HOSPITAL_COMMUNITY): Payer: Self-pay | Admitting: Psychiatry

## 2023-09-15 VITALS — Wt 208.3 lb

## 2023-09-15 DIAGNOSIS — F4312 Post-traumatic stress disorder, chronic: Secondary | ICD-10-CM | POA: Diagnosis not present

## 2023-09-15 DIAGNOSIS — F900 Attention-deficit hyperactivity disorder, predominantly inattentive type: Secondary | ICD-10-CM

## 2023-09-15 DIAGNOSIS — F411 Generalized anxiety disorder: Secondary | ICD-10-CM | POA: Diagnosis not present

## 2023-09-15 DIAGNOSIS — F331 Major depressive disorder, recurrent, moderate: Secondary | ICD-10-CM

## 2023-09-15 MED ORDER — ATOMOXETINE HCL 10 MG PO CAPS: 10.0000 mg | ORAL_CAPSULE | Freq: Two times a day (BID) | ORAL | 2 refills | Status: AC
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
## 2023-09-17 ENCOUNTER — Encounter: Payer: Self-pay | Admitting: Physician Assistant

## 2023-09-23 DIAGNOSIS — R42 Dizziness and giddiness: Secondary | ICD-10-CM | POA: Diagnosis not present

## 2023-09-23 DIAGNOSIS — M25552 Pain in left hip: Secondary | ICD-10-CM | POA: Diagnosis not present

## 2023-09-25 ENCOUNTER — Encounter: Payer: Self-pay | Admitting: Physician Assistant

## 2023-09-25 DIAGNOSIS — Z992 Dependence on renal dialysis: Secondary | ICD-10-CM | POA: Diagnosis not present

## 2023-09-25 DIAGNOSIS — N186 End stage renal disease: Secondary | ICD-10-CM | POA: Diagnosis not present

## 2023-09-28 ENCOUNTER — Encounter (HOSPITAL_COMMUNITY)
Admission: AD | Disposition: A | Discharge status: Home / Self Care | Payer: Self-pay | Source: Ambulatory Visit | Attending: Internal Medicine

## 2023-09-28 ENCOUNTER — Ambulatory Visit (HOSPITAL_COMMUNITY)
Admission: AD | Admit: 2023-09-28 | Discharge: 2023-09-28 | Disposition: A | Discharge status: Home / Self Care | Payer: BLUE CROSS/BLUE SHIELD | Source: Ambulatory Visit | Attending: Internal Medicine | Admitting: Internal Medicine

## 2023-09-28 ENCOUNTER — Other Ambulatory Visit: Payer: Self-pay

## 2023-09-28 DIAGNOSIS — Z79899 Other long term (current) drug therapy: Secondary | ICD-10-CM | POA: Insufficient documentation

## 2023-09-28 DIAGNOSIS — E039 Hypothyroidism, unspecified: Secondary | ICD-10-CM | POA: Diagnosis not present

## 2023-09-28 DIAGNOSIS — Y839 Surgical procedure, unspecified as the cause of abnormal reaction of the patient, or of later complication, without mention of misadventure at the time of the procedure: Secondary | ICD-10-CM | POA: Insufficient documentation

## 2023-09-28 DIAGNOSIS — T8249XA Other complication of vascular dialysis catheter, initial encounter: Secondary | ICD-10-CM | POA: Insufficient documentation

## 2023-09-28 DIAGNOSIS — Z992 Dependence on renal dialysis: Secondary | ICD-10-CM | POA: Insufficient documentation

## 2023-09-28 DIAGNOSIS — N186 End stage renal disease: Secondary | ICD-10-CM | POA: Diagnosis not present

## 2023-09-28 DIAGNOSIS — E785 Hyperlipidemia, unspecified: Secondary | ICD-10-CM | POA: Diagnosis not present

## 2023-09-28 DIAGNOSIS — I120 Hypertensive chronic kidney disease with stage 5 chronic kidney disease or end stage renal disease: Secondary | ICD-10-CM | POA: Diagnosis not present

## 2023-09-28 DIAGNOSIS — Z87891 Personal history of nicotine dependence: Secondary | ICD-10-CM | POA: Insufficient documentation

## 2023-09-28 HISTORY — PX: DIALYSIS/PERMA CATHETER INSERTION: CATH118288

## 2023-09-28 HISTORY — PX: DIALYSIS/PERMA CATHETER REMOVAL: CATH118289

## 2023-09-28 HISTORY — PX: PERIPHERAL VASCULAR BALLOON ANGIOPLASTY: CATH118281

## 2023-09-28 SURGERY — DIALYSIS/PERMA CATHETER REMOVAL
Anesthesia: LOCAL | Laterality: Right

## 2023-09-28 MED ORDER — FENTANYL CITRATE (PF) 100 MCG/2ML IJ SOLN
INTRAMUSCULAR | Status: AC
  Filled 2023-09-28: qty 2

## 2023-09-28 MED ORDER — LIDOCAINE HCL (PF) 1 % IJ SOLN
INTRAMUSCULAR | Status: DC | PRN
  Administered 2023-09-28: 2 mL via INTRADERMAL

## 2023-09-28 MED ORDER — SODIUM CHLORIDE 0.9 % IV SOLN: INTRAVENOUS | Status: DC

## 2023-09-28 MED ORDER — MIDAZOLAM HCL 2 MG/2ML IJ SOLN
INTRAMUSCULAR | Status: AC
  Filled 2023-09-28: qty 2

## 2023-09-28 MED ORDER — ONDANSETRON HCL 4 MG/2ML IJ SOLN: 4.0000 mg | Freq: Four times a day (QID) | INTRAMUSCULAR | Status: DC | PRN

## 2023-09-28 MED ORDER — HEPARIN (PORCINE) IN NACL 1000-0.9 UT/500ML-% IV SOLN
INTRAVENOUS | Status: DC | PRN
  Administered 2023-09-28: 500 mL

## 2023-09-28 MED ORDER — HEPARIN SODIUM (PORCINE) 1000 UNIT/ML IJ SOLN
INTRAMUSCULAR | Status: DC | PRN
  Administered 2023-09-28: 3200 [IU] via INTRAVENOUS

## 2023-09-28 MED ORDER — LIDOCAINE HCL (PF) 1 % IJ SOLN
INTRAMUSCULAR | Status: AC
  Filled 2023-09-28: qty 30

## 2023-09-28 MED ORDER — HEPARIN SODIUM (PORCINE) 1000 UNIT/ML IJ SOLN
INTRAMUSCULAR | Status: AC
  Filled 2023-09-28: qty 10

## 2023-09-28 MED ORDER — FENTANYL CITRATE (PF) 100 MCG/2ML IJ SOLN
INTRAMUSCULAR | Status: DC | PRN
  Administered 2023-09-28: 25 ug via INTRAVENOUS

## 2023-09-28 MED ORDER — MIDAZOLAM HCL 2 MG/2ML IJ SOLN
INTRAMUSCULAR | Status: DC | PRN
  Administered 2023-09-28: 1 mg via INTRAVENOUS

## 2023-09-28 MED ORDER — IODIXANOL 320 MG/ML IV SOLN
INTRAVENOUS | Status: DC | PRN
  Administered 2023-09-28: 3 mL via INTRAVENOUS

## 2023-09-28 SURGICAL SUPPLY — 7 items
BALLN MUSTANG 8X80X75 (BALLOONS) ×2 IMPLANT
BALLOON MUSTANG 8X80X75 (BALLOONS) IMPLANT
CATH PALINDROME 19 SP (CATHETERS) IMPLANT
COVER DOME SNAP 22 D (MISCELLANEOUS) IMPLANT
GUIDEWIRE ZIPWIRE 035/150 ANGL (WIRE) IMPLANT
PACK EP LF (CUSTOM PROCEDURE TRAY) IMPLANT
SYR MEDALLION 10ML (SYRINGE) IMPLANT

## 2023-09-28 NOTE — H&P (View-Only) (Signed)
 [facility]  INPATIENT CONSULTATION  Reason for Consultation: HD access dysfunction Requesting Provider: Dr. Mosetta Pigeon  HPI: Holly [name] is an 38 y.o. 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   Pt has poorly maturing L RC AVF and has been doing HHD at home via [facility] placed at [facility] in 09/2022.  Recently she has had poor flows and has used tPA x3.  She was unable to run at rx'd BFR again this week and presents for [facility] exchange.   Otherwise she's had no issues with TDC.    PMH: Past Medical History:  Diagnosis Date   Anemia    Anxiety    Arthritis    hands   Asthmatic bronchitis 02/11/2023   Blood transfusion without reported diagnosis    Cancer (HCC)    Complication of anesthesia    with Fentanyl she is difficult to awake   Depression    Dialysis patient (HCC)    Dyspnea    due to anemia   End stage renal disease (HCC)    Epilepsy (HCC)    Hashimoto's disease    History of kidney stones    HLD (hyperlipidemia)    Hypertension    Hypothyroidism    Membranoproliferative glomerulonephritis type 2, idiopathic    Pneumonia    Renal disorder    Thyroid disease    PSH: Past Surgical History:  Procedure Laterality Date   A/V FISTULAGRAM Left 09/09/2022   Procedure: A/V Fistulagram;  Surgeon: Renford Dills, MD;  Location: [facility];  Service: Cardiovascular;  Laterality: Left;   ABDOMINAL HYSTERECTOMY     ABDOMINAL SURGERY     AV FISTULA PLACEMENT Left 01/17/2022   Procedure: ARTERIOVENOUS (AV) FISTULA CREATION ( RADIAL CEPHALIC);  Surgeon: Renford Dills, MD;  Location: [facility];  Service: Vascular;  Laterality: Left;   CERVICAL BIOPSY  W/ LOOP ELECTRODE EXCISION  2018   COLONOSCOPY N/A 01/30/2022   Procedure: COLONOSCOPY;  Surgeon: Jaynie Collins, DO;  Location: [facility];  Service: Gastroenterology;   Laterality: N/A;   COLONOSCOPY WITH PROPOFOL N/A 02/01/2022   Procedure: COLONOSCOPY WITH PROPOFOL;  Surgeon: Regis Bill, MD;  Location: [facility] ENDOS[facility];  Service: Endoscopy;  Laterality: N/A;   DIAGNOSTIC LAPAROSCOPY     due to removal of PD catheters   DIALYSIS/PERMA CATHETER INSERTION N/A 06/09/2022   Procedure: DIALYSIS/PERMA CATHETER INSERTION;  Surgeon: Annice Needy, MD;  Location: [facility];  Service: Cardiovascular;  Laterality: N/A;   DIALYSIS/PERMA CATHETER INSERTION N/A 06/30/2022   Procedure: DIALYSIS/PERMA CATHETER INSERTION;  Surgeon: Cephus Shelling, MD;  Location: [facility];  Service: Cardiovascular;  Laterality: N/A;   ESOPHAGOGASTRODUODENOSCOPY N/A 01/30/2022   Procedure: ESOPHAGOGASTRODUODENOSCOPY (EGD);  Surgeon: Jaynie Collins, DO;  Location: [facility] ENDOSCOPY;  Service: Gastroenterology;  Laterality: N/A;   ESOPHAGOGASTRODUODENOSCOPY (EGD) WITH PROPOFOL N/A 02/01/2022   Procedure: ESOPHAGOGASTRODUODENOSCOPY (EGD) WITH PROPOFOL;  Surgeon: Regis Bill, MD;  Location: [facility];  Service: Endoscopy;  Laterality: N/A;   RENAL BIOPSY     x 2   TUMOR REMOVAL     right face   UTERINE ARTERY EMBOLIZATION  05/04/2022   Past Medical History:  Diagnosis Date   Anemia    Anxiety    Arthritis    hands   Asthmatic bronchitis 02/11/2023   Blood transfusion without reported diagnosis    Cancer (HCC)  Complication of anesthesia    with Fentanyl she is difficult to awake   Depression    Dialysis patient (HCC)    Dyspnea    due to anemia   End stage renal disease (HCC)    Epilepsy (HCC)    Hashimoto's disease    History of kidney stones    HLD (hyperlipidemia)    Hypertension    Hypothyroidism    Membranoproliferative glomerulonephritis type 2, idiopathic    Pneumonia    Renal disorder    Thyroid disease     Medications:  I have reviewed the patient's current medications.  Medications Prior to Admission   Medication Sig Dispense Refill   acyclovir (ZOVIRAX) 200 MG capsule Take 200 mg by mouth 2 (two) times daily as needed (out breaks).     atomoxetine (STRATTERA) 10 MG capsule Take 1 capsule (10 mg total) by mouth 2 (two) times daily with a meal. 60 capsule 2   carvedilol (COREG) 12.5 MG tablet Take 12.5 mg by mouth 2 (two) times daily with a meal.     cetirizine (ZYRTEC) 10 MG tablet Take 10 mg by mouth at bedtime.     cinacalcet (SENSIPAR) 60 MG tablet Take 60 mg by mouth at bedtime.     diclofenac Sodium (VOLTAREN) 1 % GEL Apply 1 application  topically 4 (four) times daily as needed (hand,feet,thighs and back pain).     epoetin alfa (EPOGEN) 20000 UNIT/ML injection Inject 20,000 Units into the skin every 14 (fourteen) days.     FOSRENOL 1000 MG chewable tablet Chew 1,000 mg by mouth 3 (three) times daily with meals.     HYDROcodone-acetaminophen (NORCO/VICODIN) 5-325 MG tablet Take 1 tablet by mouth 4 (four) times daily as needed for pain. (Patient taking differently: Take 1 tablet by mouth 4 (four) times daily as needed (Pain management).) 120 tablet 0   iron sucrose (VENOFER) 20 MG/ML injection Inject 1,000 mg into the vein every 30 (thirty) days.     levothyroxine (SYNTHROID) 200 MCG tablet Take 200 mcg by mouth at bedtime.     losartan (COZAAR) 100 MG tablet Take 100 mg by mouth daily.     albuterol (VENTOLIN HFA) 108 (90 Base) MCG/ACT inhaler Inhale 2-4 puffs into the lungs every 4 (four) hours as needed for wheezing (or cough). (Patient not taking: Reported on 09/25/2023) 18 g 0   escitalopram (LEXAPRO) 10 MG tablet Take 1 tablet (10 mg total) by mouth daily. (Patient not taking: Reported on 04/13/2023) 90 tablet 0   HYDROcodone-acetaminophen (NORCO/VICODIN) 5-325 MG tablet Take 1 tablet by mouth 4 (four) times daily as needed for pain. 120 tablet 0    ALLERGIES:   Allergies  Allergen Reactions   Ciprofloxacin Anaphylaxis and Other (See Comments)    Caused Seizures   Seizures    Other reaction(s): Other (See Comments)  Seizures  Seizures     Caused Seizures  Seizures  Seizures  Other reaction(s): Other (See Comments) Seizures  Seizures  Seizures     Other reaction(s): Other (See Comments) Seizures  Seizures  Seizures     Caused Seizures  Caused Seizures  Seizures  Seizures  Other reaction(s): Other (See Comments) Seizures  Seizures  Seizures    Seizures Grand mal   Levetiracetam Anaphylaxis and Other (See Comments)    Medical Record H&P (unknown reaction)  Medical Record H&P  Other reaction(s): Other (See Comments)   Oxycodone-Acetaminophen Nausea Only and Other (See Comments)   Rituximab Anaphylaxis and Swelling   Tramadol  Anaphylaxis and Other (See Comments)    Caused seizures  Other reaction(s): Other (See Comments)  Medical Record H&P  Medical Record H&P    Caused seizures    FAM HX: Family History  Problem Relation Age of Onset   Multiple sclerosis Mother    Cancer Father    COPD Father     Social History:   reports that she quit smoking about 2 years ago. Her smoking use included cigarettes. She has never used smokeless tobacco. She reports that she does not drink alcohol and does not use drugs.  ROS: 12 system ROS neg except per HPI  Blood pressure (!) 157/100, pulse 86, temperature (!) 97.4 F (36.3 C), temperature source Temporal, resp. rate 16, last menstrual period 03/31/2022, SpO2 99%. PHYSICAL EXAM: Gen: well appearing  Eyes: glasses, EOMI ENT: class 2 airway Neck:  RIJ TDC c/d/i CV: RRR Lungs: clear ant on RA Extr: no edema Neuro: nonfocal Skin: exit site of TDC c/d/I, no erythema or drainage   No results found for this or any previous visit (from the past 48 hours).  No results found.  Assessment/PlanMary Slyvianne Eulogia [name] is an 38 y.o. 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   **ESRD with HD catheter  dysfunction:  unable to run BFR > 200-2102mL/min due to high pressures despite tPA x3.  Exchange indicated.  Will determine catheter tip/length is appropriate as well as central venogram assess for presence of fibrin sheath.  Patient knowledgeable about process and agreeable to proceed.   Tyler Pita 09/28/2023, 9:02 AM

## 2023-09-28 NOTE — H&P (Signed)
[facility]  INPATIENT CONSULTATION  Reason for Consultation: HD access dysfunction Requesting Provider: Dr. Mosetta Pigeon  HPI: Holly [name] is an 38 y.o. 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   Pt has poorly maturing L RC AVF and has been doing HHD at home via [facility] placed at [facility] in 09/2022.  Recently she has had poor flows and has used tPA x3.  She was unable to run at rx'd BFR again this week and presents for [facility] exchange.   Otherwise she's had no issues with TDC.    PMH: Past Medical History:  Diagnosis Date   Anemia    Anxiety    Arthritis    hands   Asthmatic bronchitis 02/11/2023   Blood transfusion without reported diagnosis    Cancer (HCC)    Complication of anesthesia    with Fentanyl she is difficult to awake   Depression    Dialysis patient (HCC)    Dyspnea    due to anemia   End stage renal disease (HCC)    Epilepsy (HCC)    Hashimoto's disease    History of kidney stones    HLD (hyperlipidemia)    Hypertension    Hypothyroidism    Membranoproliferative glomerulonephritis type 2, idiopathic    Pneumonia    Renal disorder    Thyroid disease    PSH: Past Surgical History:  Procedure Laterality Date   A/V FISTULAGRAM Left 09/09/2022   Procedure: A/V Fistulagram;  Surgeon: Renford Dills, MD;  Location: [facility];  Service: Cardiovascular;  Laterality: Left;   ABDOMINAL HYSTERECTOMY     ABDOMINAL SURGERY     AV FISTULA PLACEMENT Left 01/17/2022   Procedure: ARTERIOVENOUS (AV) FISTULA CREATION ( RADIAL CEPHALIC);  Surgeon: Renford Dills, MD;  Location: [facility];  Service: Vascular;  Laterality: Left;   CERVICAL BIOPSY  W/ LOOP ELECTRODE EXCISION  2018   COLONOSCOPY N/A 01/30/2022   Procedure: COLONOSCOPY;  Surgeon: Jaynie Collins, DO;  Location: [facility];  Service: Gastroenterology;   Laterality: N/A;   COLONOSCOPY WITH PROPOFOL N/A 02/01/2022   Procedure: COLONOSCOPY WITH PROPOFOL;  Surgeon: Regis Bill, MD;  Location: [facility];  Service: Endoscopy;  Laterality: N/A;   DIAGNOSTIC LAPAROSCOPY     due to removal of PD catheters   DIALYSIS/PERMA CATHETER INSERTION N/A 06/09/2022   Procedure: DIALYSIS/PERMA CATHETER INSERTION;  Surgeon: Annice Needy, MD;  Location: [facility];  Service: Cardiovascular;  Laterality: N/A;   DIALYSIS/PERMA CATHETER INSERTION N/A 06/30/2022   Procedure: DIALYSIS/PERMA CATHETER INSERTION;  Surgeon: Cephus Shelling, MD;  Location: [facility];  Service: Cardiovascular;  Laterality: N/A;   ESOPHAGOGASTRODUODENOSCOPY N/A 01/30/2022   Procedure: ESOPHAGOGASTRODUODENOSCOPY (EGD);  Surgeon: Jaynie Collins, DO;  Location: [facility] ENDOSCOPY;  Service: Gastroenterology;  Laterality: N/A;   ESOPHAGOGASTRODUODENOSCOPY (EGD) WITH PROPOFOL N/A 02/01/2022   Procedure: ESOPHAGOGASTRODUODENOSCOPY (EGD) WITH PROPOFOL;  Surgeon: Regis Bill, MD;  Location: [facility];  Service: Endoscopy;  Laterality: N/A;   RENAL BIOPSY     x 2   TUMOR REMOVAL     right face   UTERINE ARTERY EMBOLIZATION  05/04/2022   Past Medical History:  Diagnosis Date   Anemia    Anxiety    Arthritis    hands   Asthmatic bronchitis 02/11/2023   Blood transfusion without reported diagnosis    Cancer (HCC)  Complication of anesthesia    with Fentanyl she is difficult to awake   Depression    Dialysis patient (HCC)    Dyspnea    due to anemia   End stage renal disease (HCC)    Epilepsy (HCC)    Hashimoto's disease    History of kidney stones    HLD (hyperlipidemia)    Hypertension    Hypothyroidism    Membranoproliferative glomerulonephritis type 2, idiopathic    Pneumonia    Renal disorder    Thyroid disease     Medications:  I have reviewed the patient's current medications.  Medications Prior to Admission   Medication Sig Dispense Refill   acyclovir (ZOVIRAX) 200 MG capsule Take 200 mg by mouth 2 (two) times daily as needed (out breaks).     atomoxetine (STRATTERA) 10 MG capsule Take 1 capsule (10 mg total) by mouth 2 (two) times daily with a meal. 60 capsule 2   carvedilol (COREG) 12.5 MG tablet Take 12.5 mg by mouth 2 (two) times daily with a meal.     cetirizine (ZYRTEC) 10 MG tablet Take 10 mg by mouth at bedtime.     cinacalcet (SENSIPAR) 60 MG tablet Take 60 mg by mouth at bedtime.     diclofenac Sodium (VOLTAREN) 1 % GEL Apply 1 application  topically 4 (four) times daily as needed (hand,feet,thighs and back pain).     epoetin alfa (EPOGEN) 20000 UNIT/ML injection Inject 20,000 Units into the skin every 14 (fourteen) days.     FOSRENOL 1000 MG chewable tablet Chew 1,000 mg by mouth 3 (three) times daily with meals.     HYDROcodone-acetaminophen (NORCO/VICODIN) 5-325 MG tablet Take 1 tablet by mouth 4 (four) times daily as needed for pain. (Patient taking differently: Take 1 tablet by mouth 4 (four) times daily as needed (Pain management).) 120 tablet 0   iron sucrose (VENOFER) 20 MG/ML injection Inject 1,000 mg into the vein every 30 (thirty) days.     levothyroxine (SYNTHROID) 200 MCG tablet Take 200 mcg by mouth at bedtime.     losartan (COZAAR) 100 MG tablet Take 100 mg by mouth daily.     albuterol (VENTOLIN HFA) 108 (90 Base) MCG/ACT inhaler Inhale 2-4 puffs into the lungs every 4 (four) hours as needed for wheezing (or cough). (Patient not taking: Reported on 09/25/2023) 18 g 0   escitalopram (LEXAPRO) 10 MG tablet Take 1 tablet (10 mg total) by mouth daily. (Patient not taking: Reported on 04/13/2023) 90 tablet 0   HYDROcodone-acetaminophen (NORCO/VICODIN) 5-325 MG tablet Take 1 tablet by mouth 4 (four) times daily as needed for pain. 120 tablet 0    ALLERGIES:   Allergies  Allergen Reactions   Ciprofloxacin Anaphylaxis and Other (See Comments)    Caused Seizures   Seizures    Other reaction(s): Other (See Comments)  Seizures  Seizures     Caused Seizures  Seizures  Seizures  Other reaction(s): Other (See Comments) Seizures  Seizures  Seizures     Other reaction(s): Other (See Comments) Seizures  Seizures  Seizures     Caused Seizures  Caused Seizures  Seizures  Seizures  Other reaction(s): Other (See Comments) Seizures  Seizures  Seizures    Seizures Grand mal   Levetiracetam Anaphylaxis and Other (See Comments)    Medical Record H&P (unknown reaction)  Medical Record H&P  Other reaction(s): Other (See Comments)   Oxycodone-Acetaminophen Nausea Only and Other (See Comments)   Rituximab Anaphylaxis and Swelling   Tramadol  Anaphylaxis and Other (See Comments)    Caused seizures  Other reaction(s): Other (See Comments)  Medical Record H&P  Medical Record H&P    Caused seizures    FAM HX: Family History  Problem Relation Age of Onset   Multiple sclerosis Mother    Cancer Father    COPD Father     Social History:   reports that she quit smoking about 2 years ago. Her smoking use included cigarettes. She has never used smokeless tobacco. She reports that she does not drink alcohol and does not use drugs.  ROS: 12 system ROS neg except per HPI  Blood pressure (!) 157/100, pulse 86, temperature (!) 97.4 F (36.3 C), temperature source Temporal, resp. rate 16, last menstrual period 03/31/2022, SpO2 99%. PHYSICAL EXAM: Gen: well appearing  Eyes: glasses, EOMI ENT: class 2 airway Neck:  RIJ TDC c/d/i CV: RRR Lungs: clear ant on RA Extr: no edema Neuro: nonfocal Skin: exit site of TDC c/d/I, no erythema or drainage   No results found for this or any previous visit (from the past 48 hours).  No results found.  Assessment/PlanMary Slyvianne Eulogia Dismore is an 38 y.o. 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   **ESRD with HD catheter  dysfunction:  unable to run BFR > 200-2102mL/min due to high pressures despite tPA x3.  Exchange indicated.  Will determine catheter tip/length is appropriate as well as central venogram assess for presence of fibrin sheath.  Patient knowledgeable about process and agreeable to proceed.   Tyler Pita 09/28/2023, 9:02 AM

## 2023-09-28 NOTE — Op Note (Addendum)
Patient presents with poor flows in her right IJ tunneled hemodialysis catheter (19 cm Glidepath) that was placed at [facility] about 1 year ago.  Chest x-ray confirms the catheter tip is appropriately positioned in the RA. The catheter cuff is 1/2cm deep to the exit site.    Summary:  1) The patient had a successful 19 cm CTT Palindrome hemodialysis catheter exchange in the right internal jugular vein. 2) Central venous restriction 70% noted at innominate SVC junction treated with 10mm balloon angioplasty. 3) Okay to use catheter immediately.  Description of procedure: The right neck, chest and the catheter were prepped and draped in the usual sterile fashion. The exit site and adjacent tunnel tract were anesthetized with lidocaine 1% with epinephrine. The cuff was dissected free with a curved Kelly and manual traction. The catheter was withdrawn and venogram revealed a 70% venous restriction/fibrin sheath at the innominate SVC junction. .   A hydrophilic wire was manipulated and advanced through the arterial port and the tip was parked in the IVC. The catheter was completely removed and noted to be entirely intact.  An 8x49mm Mustang angioplasty balloon was inserted over the wire and passed to the location of the venous restriction where venous angioplasty (37248) was performed to full effacement at 14 ATM.   A new 19 cm cuff to tip Palindrome catheter was inserted over the guidewire and the tip parked in the right atrium and at that point the cuff was approximately 1 cm deep to the exit site.   Aspiration and flushing of both limbs of the catheter confirmed excellent flow. No kinks were visible on fluoroscopic imaging. Both limbs of the catheter were locked with heparin and sterile caps were placed.   The hub was anchored on to the chest wall with 2-0 nylon wing suture.  A 3-0 moncryl absorbable suture was used to placed a purse string suture at the exit site.   Sterile dressings were  placed, and the patient returned to recovery in stable condition.  Sedation: 1 mg Versed, 25 mcg Fentanyl Sedation time: 19 minutes  Contrast: 4 mL  Monitoring: Because of the patient's comorbid conditions and sedation during the procedure, continuous EKG monitoring and O2 saturation monitoring was performed throughout the procedure by the RN. There were no abnormal arrhythmias encountered.  Complications: None.  Diagnoses:   T82.49XA Other complication of vascular dialysis catheter (Poor flows) N18.6 End stage renal disease  Z99.2 Dialysis dependence  Procedures Coding:  36581 Tunneled catheter exchange 77001  Fluoroscopy guidance for catheter exchange. 37248 Innominate  vein angioplasty [id number] Contrast  Recommendations: Remove the suture in 3 weeks. 2.   Report any blood flow problems to [facility].  Discharge: The patient was discharged home in stable condition. The patient was given education regarding the care of the catheter and specific instructions in case of any problems. \

## 2023-09-28 NOTE — Discharge Instructions (Signed)

## 2023-09-29 ENCOUNTER — Other Ambulatory Visit (HOSPITAL_COMMUNITY): Payer: Self-pay

## 2023-09-29 ENCOUNTER — Encounter (HOSPITAL_COMMUNITY): Payer: Self-pay | Admitting: Internal Medicine

## 2023-09-30 ENCOUNTER — Other Ambulatory Visit: Payer: Self-pay

## 2023-09-30 ENCOUNTER — Encounter (HOSPITAL_COMMUNITY)
Admission: RE | Disposition: A | Discharge status: Home / Self Care | Payer: Self-pay | Source: Home / Self Care | Attending: Nephrology

## 2023-09-30 ENCOUNTER — Ambulatory Visit (HOSPITAL_COMMUNITY)
Admission: RE | Admit: 2023-09-30 | Discharge: 2023-09-30 | Disposition: A | Discharge status: Home / Self Care | Payer: Medicare Other | Attending: Nephrology | Admitting: Nephrology

## 2023-09-30 DIAGNOSIS — I120 Hypertensive chronic kidney disease with stage 5 chronic kidney disease or end stage renal disease: Secondary | ICD-10-CM | POA: Diagnosis not present

## 2023-09-30 DIAGNOSIS — Y839 Surgical procedure, unspecified as the cause of abnormal reaction of the patient, or of later complication, without mention of misadventure at the time of the procedure: Secondary | ICD-10-CM | POA: Diagnosis not present

## 2023-09-30 DIAGNOSIS — Z992 Dependence on renal dialysis: Secondary | ICD-10-CM | POA: Insufficient documentation

## 2023-09-30 DIAGNOSIS — T8249XA Other complication of vascular dialysis catheter, initial encounter: Secondary | ICD-10-CM | POA: Diagnosis present

## 2023-09-30 DIAGNOSIS — N186 End stage renal disease: Secondary | ICD-10-CM | POA: Diagnosis not present

## 2023-09-30 HISTORY — PX: DIALYSIS/PERMA CATHETER INSERTION: CATH118288

## 2023-09-30 SURGERY — DIALYSIS/PERMA CATHETER INSERTION
Anesthesia: LOCAL

## 2023-09-30 MED ORDER — LIDOCAINE HCL (PF) 1 % IJ SOLN
INTRAMUSCULAR | Status: AC
  Filled 2023-09-30: qty 30

## 2023-09-30 MED ORDER — MIDAZOLAM HCL 2 MG/2ML IJ SOLN
INTRAMUSCULAR | Status: DC | PRN
  Administered 2023-09-30: 1 mg via INTRAVENOUS

## 2023-09-30 MED ORDER — FENTANYL CITRATE (PF) 100 MCG/2ML IJ SOLN
INTRAMUSCULAR | Status: DC | PRN
  Administered 2023-09-30: 25 ug via INTRAVENOUS

## 2023-09-30 MED ORDER — IODIXANOL 320 MG/ML IV SOLN
INTRAVENOUS | Status: DC | PRN
  Administered 2023-09-30: 5 mL via INTRAVENOUS

## 2023-09-30 MED ORDER — MIDAZOLAM HCL 2 MG/2ML IJ SOLN
INTRAMUSCULAR | Status: AC
Start: 2023-09-30 — End: ?
  Filled 2023-09-30: qty 2

## 2023-09-30 MED ORDER — HEPARIN (PORCINE) IN NACL 1000-0.9 UT/500ML-% IV SOLN
INTRAVENOUS | Status: DC | PRN
  Administered 2023-09-30: 500 mL

## 2023-09-30 MED ORDER — FENTANYL CITRATE (PF) 100 MCG/2ML IJ SOLN
INTRAMUSCULAR | Status: AC
  Filled 2023-09-30: qty 2

## 2023-09-30 MED ORDER — HEPARIN SODIUM (PORCINE) 1000 UNIT/ML IJ SOLN
INTRAMUSCULAR | Status: DC | PRN
  Administered 2023-09-30: 3200 [IU] via INTRAVENOUS

## 2023-09-30 MED ORDER — HEPARIN SODIUM (PORCINE) 1000 UNIT/ML IJ SOLN
INTRAMUSCULAR | Status: AC
  Filled 2023-09-30: qty 10

## 2023-09-30 MED ORDER — LIDOCAINE HCL (PF) 1 % IJ SOLN
INTRAMUSCULAR | Status: DC | PRN
  Administered 2023-09-30: 5 mL via INTRADERMAL

## 2023-09-30 SURGICAL SUPPLY — 9 items
BAG SNAP BAND KOVER 36X36 (MISCELLANEOUS) IMPLANT
BALLN MUSTANG 10.0X40 75 (BALLOONS) ×1
BALLOON MUSTANG 10.0X40 75 (BALLOONS) IMPLANT
CATH PALINDROME 19 SP (CATHETERS) IMPLANT
COVER DOME SNAP 22 D (MISCELLANEOUS) IMPLANT
GUIDEWIRE ZIPWIRE 035/150 ANGL (WIRE) IMPLANT
SHEATH PINNACLE 25CM (SHEATH) IMPLANT
SYR MEDALLION 10ML (SYRINGE) IMPLANT
TRAY PV CATH (CUSTOM PROCEDURE TRAY) ×1 IMPLANT

## 2023-09-30 NOTE — H&P (Signed)
 [facility]  INPATIENT CONSULTATION  Reason for Consultation: HD access dysfunction Requesting Provider: Dr. Saralee Stank  HPI: Kalene Slyvianne Lexxie Winberg is an 38 y.o. 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   Pt has poorly maturing L RC AVF and has been doing HHD at home via [facility] placed at [facility] in 09/2022.  She just had a catheter exchange on September 28, 2023 with restriction in the innominate vein treated with PTA.  Unfortunately the catheter has not been able to be used on dialysis.    PMH: Past Medical History:  Diagnosis Date   Anemia    Anxiety    Arthritis    hands   Asthmatic bronchitis 02/11/2023   Blood transfusion without reported diagnosis    Cancer (HCC)    Complication of anesthesia    with Fentanyl  she is difficult to awake   Depression    Dialysis patient (HCC)    Dyspnea    due to anemia   End stage renal disease (HCC)    Epilepsy (HCC)    Hashimoto's disease    History of kidney stones    HLD (hyperlipidemia)    Hypertension    Hypothyroidism    Membranoproliferative glomerulonephritis type 2, idiopathic    Pneumonia    Renal disorder    Thyroid  disease    PSH: Past Surgical History:  Procedure Laterality Date   A/V FISTULAGRAM Left 09/09/2022   Procedure: A/V Fistulagram;  Surgeon: Jama Cordella MATSU, MD;  Location: [facility] INVASIVE CV LAB;  Service: Cardiovascular;  Laterality: Left;   ABDOMINAL HYSTERECTOMY     ABDOMINAL SURGERY     AV FISTULA PLACEMENT Left 01/17/2022   Procedure: ARTERIOVENOUS (AV) FISTULA CREATION ( RADIAL CEPHALIC);  Surgeon: Jama Cordella MATSU, MD;  Location: [facility] ORS;  Service: Vascular;  Laterality: Left;   CERVICAL BIOPSY  W/ LOOP ELECTRODE EXCISION  2018   COLONOSCOPY N/A 01/30/2022   Procedure: COLONOSCOPY;  Surgeon: Onita Elspeth Sharper, DO;  Location: [facility] ENDOSCOPY;  Service: Gastroenterology;   Laterality: N/A;   COLONOSCOPY WITH PROPOFOL  N/A 02/01/2022   Procedure: COLONOSCOPY WITH PROPOFOL ;  Surgeon: Maryruth Ole DASEN, MD;  Location: [facility] ENDOSCOPY;  Service: Endoscopy;  Laterality: N/A;   DIAGNOSTIC LAPAROSCOPY     due to removal of PD catheters   DIALYSIS/PERMA CATHETER INSERTION N/A 06/09/2022   Procedure: DIALYSIS/PERMA CATHETER INSERTION;  Surgeon: Marea Selinda RAMAN, MD;  Location: [facility] INVASIVE CV LAB;  Service: Cardiovascular;  Laterality: N/A;   DIALYSIS/PERMA CATHETER INSERTION N/A 06/30/2022   Procedure: DIALYSIS/PERMA CATHETER INSERTION;  Surgeon: Gretta Lonni PARAS, MD;  Location: [facility] INVASIVE CV LAB;  Service: Cardiovascular;  Laterality: N/A;   DIALYSIS/PERMA CATHETER INSERTION N/A 09/28/2023   Procedure: DIALYSIS/PERMA CATHETER INSERTION;  Surgeon: Norine Manuelita LABOR, MD;  Location: [facility] INVASIVE CV LAB;  Service: Cardiovascular;  Laterality: N/A;   DIALYSIS/PERMA CATHETER REMOVAL N/A 09/28/2023   Procedure: DIALYSIS/PERMA CATHETER REMOVAL;  Surgeon: Norine Manuelita LABOR, MD;  Location: [facility] INVASIVE CV LAB;  Service: Cardiovascular;  Laterality: N/A;   ESOPHAGOGASTRODUODENOSCOPY N/A 01/30/2022   Procedure: ESOPHAGOGASTRODUODENOSCOPY (EGD);  Surgeon: Onita Elspeth Sharper, DO;  Location: [facility] ENDOSCOPY;  Service: Gastroenterology;  Laterality: N/A;   ESOPHAGOGASTRODUODENOSCOPY (EGD) WITH PROPOFOL  N/A 02/01/2022   Procedure: ESOPHAGOGASTRODUODENOSCOPY (EGD) WITH PROPOFOL ;  Surgeon: Maryruth Ole DASEN, MD;  Location: [facility] ENDOSCOPY;  Service: Endoscopy;  Laterality: N/A;   PERIPHERAL VASCULAR BALLOON ANGIOPLASTY Right 09/28/2023   Procedure: PERIPHERAL  VASCULAR BALLOON ANGIOPLASTY;  Surgeon: Norine Manuelita LABOR, MD;  Location: [facility] INVASIVE CV LAB;  Service: Cardiovascular;  Laterality: Right;  innominate sheath   RENAL BIOPSY     x 2   TUMOR REMOVAL     right face   UTERINE ARTERY EMBOLIZATION  05/04/2022   Past Medical History:  Diagnosis Date   Anemia    Anxiety    Arthritis     hands   Asthmatic bronchitis 02/11/2023   Blood transfusion without reported diagnosis    Cancer (HCC)    Complication of anesthesia    with Fentanyl  she is difficult to awake   Depression    Dialysis patient (HCC)    Dyspnea    due to anemia   End stage renal disease (HCC)    Epilepsy (HCC)    Hashimoto's disease    History of kidney stones    HLD (hyperlipidemia)    Hypertension    Hypothyroidism    Membranoproliferative glomerulonephritis type 2, idiopathic    Pneumonia    Renal disorder    Thyroid  disease     Medications:  I have reviewed the patient's current medications.  Medications Prior to Admission  Medication Sig Dispense Refill   acyclovir  (ZOVIRAX ) 200 MG capsule Take 200 mg by mouth 2 (two) times daily as needed (out breaks).     albuterol  (VENTOLIN  HFA) 108 (90 Base) MCG/ACT inhaler Inhale 2-4 puffs into the lungs every 4 (four) hours as needed for wheezing (or cough). (Patient not taking: Reported on 09/25/2023) 18 g 0   atomoxetine  (STRATTERA ) 10 MG capsule Take 1 capsule (10 mg total) by mouth 2 (two) times daily with a meal. 60 capsule 2   carvedilol  (COREG ) 12.5 MG tablet Take 12.5 mg by mouth 2 (two) times daily with a meal.     cetirizine (ZYRTEC) 10 MG tablet Take 10 mg by mouth at bedtime.     cinacalcet  (SENSIPAR ) 60 MG tablet Take 60 mg by mouth at bedtime.     diclofenac  Sodium (VOLTAREN ) 1 % GEL Apply 1 application  topically 4 (four) times daily as needed (hand,feet,thighs and back pain).     epoetin  alfa (EPOGEN ) 20000 UNIT/ML injection Inject 20,000 Units into the skin every 14 (fourteen) days.     escitalopram  (LEXAPRO ) 10 MG tablet Take 1 tablet (10 mg total) by mouth daily. (Patient not taking: Reported on 04/13/2023) 90 tablet 0   FOSRENOL  1000 MG chewable tablet Chew 1,000 mg by mouth 3 (three) times daily with meals.     HYDROcodone -acetaminophen  (NORCO/VICODIN) 5-325 MG tablet Take 1 tablet by mouth 4 (four) times daily as needed for pain.  (Patient taking differently: Take 1 tablet by mouth 4 (four) times daily as needed (Pain management).) 120 tablet 0   HYDROcodone -acetaminophen  (NORCO/VICODIN) 5-325 MG tablet Take 1 tablet by mouth 4 (four) times daily as needed for pain. 120 tablet 0   iron  sucrose (VENOFER ) 20 MG/ML injection Inject 1,000 mg into the vein every 30 (thirty) days.     levothyroxine  (SYNTHROID ) 200 MCG tablet Take 200 mcg by mouth at bedtime.     losartan  (COZAAR ) 100 MG tablet Take 100 mg by mouth daily.      ALLERGIES:   Allergies  Allergen Reactions   Ciprofloxacin Anaphylaxis and Other (See Comments)    Caused Seizures   Seizures   Other reaction(s): Other (See Comments)  Seizures  Seizures     Caused Seizures  Seizures  Seizures  Other reaction(s): Other (See  Comments) Seizures  Seizures  Seizures     Other reaction(s): Other (See Comments) Seizures  Seizures  Seizures     Caused Seizures  Caused Seizures  Seizures  Seizures  Other reaction(s): Other (See Comments) Seizures  Seizures  Seizures    Seizures Grand mal   Levetiracetam Anaphylaxis and Other (See Comments)    Medical Record H&P (unknown reaction)  Medical Record H&P  Other reaction(s): Other (See Comments)   Oxycodone -Acetaminophen  Nausea Only and Other (See Comments)   Rituximab Anaphylaxis and Swelling   Tramadol Anaphylaxis and Other (See Comments)    Caused seizures  Other reaction(s): Other (See Comments)  Medical Record H&P  Medical Record H&P    Caused seizures    FAM HX: Family History  Problem Relation Age of Onset   Multiple sclerosis Mother    Cancer Father    COPD Father     Social History:   reports that she quit smoking about 2 years ago. Her smoking use included cigarettes. She has never used smokeless tobacco. She reports that she does not drink alcohol and does not use drugs.  ROS: 12 system ROS neg except per HPI  Blood pressure (!) 154/87, pulse 86, resp. rate 14, weight 94.5 kg, last  menstrual period 03/31/2022, SpO2 100%. PHYSICAL EXAM: Gen: well appearing  Eyes: glasses, EOMI ENT: class 2 airway Neck:  RIJ TDC c/d/i CV: RRR Lungs: clear ant on RA Extr: no edema Neuro: nonfocal Skin: exit site of TDC c/d/I, no erythema or drainage   No results found for this or any previous visit (from the past 48 hours).  No results found.  Assessment/PlanMary Slyvianne Devanee Pomplun is an 38 y.o. 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   **ESRD with HD catheter dysfunction:  unable to run BFR > 200-253mL/min due to high pressures despite tPA x3.  Exchange indicated.  Will determine catheter tip/length is appropriate as well as central venogram assess for presence of fibrin sheath.  Also check if there is resolution of the restriction and if we have to treated again I will also inject through the catheter to ensure that the restriction is resolved.  Patient knowledgeable about process and agreeable to proceed.   Donyell Carrell W 09/30/2023, 12:28 PM

## 2023-09-30 NOTE — Discharge Instructions (Signed)

## 2023-09-30 NOTE — Op Note (Signed)
 Patient presents with poor flows in her right IJ tunneled hemodialysis catheter (19 cm Palindrome) that was just replaced on 09/28/2023. Chest x-ray confirms the catheter tip is appropriately positioned in the RA. The catheter cuff is 1/2cm deep to the exit site.     Summary:  1)The patient had a successful 19 cm CTT Palindrome hemodialysis catheter exchange in the right internal jugular vein.  Good return +  flush to the arterial port, intermittent return through the blue port but good flush. 2)         Central venous restriction 70% noted at innominate SVC junction treated with 10x4 balloon angioplasty.  I also injected contrast post insertion and also through a 8 French sheath to confirm resolution of the restriction. 3)  Okay to use catheter immediately.   Description of procedure: The right neck, chest and the catheter were prepped and draped in the usual sterile fashion. The exit site and adjacent tunnel tract were anesthetized with lidocaine  1% with epinephrine . The cuff was freed with manual traction. The catheter was withdrawn and venogram revealed a 70% venous restriction/fibrin sheath at the innominate SVC junction.     A hydrophilic wire was manipulated and advanced through the arterial port and the tip was parked in the IVC. The catheter was completely removed and noted to be entirely intact.  A 10x4 Mustang angioplasty balloon was inserted over the wire and passed to the location of the venous restriction where venous angioplasty (37248) was performed to full effacement at 14 ATM.   A new 19 cm cuff to tip Palindrome catheter was inserted over the guidewire, contrast injected when the tip of the catheter was just below the clavicle confirming the absence of the restriction.  There is also no evidence of extravasation or dissection.   Catheter was advanced till the tip was parked in the right atrium and at that point the cuff was approximately 2 cm deep to the exit site.  The venous port  had very good flush and return, intermittent return through the arterial port but good flush; at this time I retracted the catheter till the cuff was approximately 1/2 to 1 cm deep to the access site.   Aspiration and flushing of red port was now very good but intermittent aspiration issues with the venous but good flush.  .  No kinks were visible on fluoroscopic imaging. Both limbs of the catheter were locked with heparin  and sterile caps were placed.    The hub was anchored on to the chest wall with 2-0 nylon wing suture.  A 3-0 moncryl absorbable suture was used to placed a purse string suture at the exit site.    Sterile dressings were placed, and the patient returned to recovery in stable condition.   Sedation: 1 mg Versed , 25 mcg Fentanyl  Sedation time: 27 minutes   Contrast: 5 mL   Monitoring: Because of the patient's comorbid conditions and sedation during the procedure, continuous EKG monitoring and O2 saturation monitoring was performed throughout the procedure by the RN. There were no abnormal arrhythmias encountered.   Complications: None.   Diagnoses:   T82.49XA Other complication of vascular dialysis catheter (Poor flows) N18.6 End stage renal disease  Z99.2 Dialysis dependence   Procedures Coding:  36581 Tunneled catheter exchange 77001  Fluoroscopy guidance for catheter exchange. 37248 Innominate  vein angioplasty [id number] Contrast   Recommendations: Remove the suture in 3 weeks. 2.   Report any blood flow problems to [facility].   Discharge:  The patient was discharged home in stable condition. The patient was given education regarding the care of the catheter and specific instructions in case of any problems.

## 2023-10-01 ENCOUNTER — Encounter (HOSPITAL_COMMUNITY): Payer: Self-pay | Admitting: Nephrology

## 2023-10-08 ENCOUNTER — Ambulatory Visit (HOSPITAL_COMMUNITY): Admission: RE | Admit: 2023-10-08 | Payer: Medicare Other | Source: Home / Self Care | Admitting: Internal Medicine

## 2023-10-08 ENCOUNTER — Encounter (HOSPITAL_COMMUNITY): Admission: RE | Payer: Self-pay | Source: Home / Self Care

## 2023-10-08 SURGERY — DIALYSIS/PERMA CATHETER INSERTION
Anesthesia: LOCAL | Laterality: Right

## 2023-10-12 ENCOUNTER — Ambulatory Visit: Payer: Medicare Other | Admitting: Internal Medicine

## 2023-10-12 ENCOUNTER — Other Ambulatory Visit: Payer: Medicare Other

## 2023-10-13 ENCOUNTER — Other Ambulatory Visit (HOSPITAL_COMMUNITY): Payer: Self-pay

## 2023-10-13 MED ORDER — HYDROCODONE-ACETAMINOPHEN 5-325 MG PO TABS
1.0000 | ORAL_TABLET | Freq: Four times a day (QID) | ORAL | 0 refills | Status: DC | PRN
  Filled 2023-11-25: qty 120, 30d supply, fill #0

## 2023-10-13 MED ORDER — HYDROCODONE-ACETAMINOPHEN 5-325 MG PO TABS
1.0000 | ORAL_TABLET | Freq: Four times a day (QID) | ORAL | 0 refills | Status: DC | PRN
  Filled 2023-10-27: qty 120, 30d supply, fill #0

## 2023-10-23 ENCOUNTER — Encounter (HOSPITAL_COMMUNITY)
Admission: RE | Disposition: A | Discharge status: Home / Self Care | Payer: Self-pay | Source: Home / Self Care | Attending: Nephrology

## 2023-10-23 ENCOUNTER — Ambulatory Visit (HOSPITAL_COMMUNITY)
Admission: RE | Admit: 2023-10-23 | Discharge: 2023-10-23 | Disposition: A | Discharge status: Home / Self Care | Payer: Medicare Other | Attending: Nephrology | Admitting: Nephrology

## 2023-10-23 ENCOUNTER — Emergency Department (HOSPITAL_COMMUNITY)
Admission: EM | Admit: 2023-10-23 | Discharge: 2023-10-24 | Disposition: A | Discharge status: Home / Self Care | Payer: Medicare Other | Attending: Emergency Medicine | Admitting: Emergency Medicine

## 2023-10-23 ENCOUNTER — Encounter (HOSPITAL_COMMUNITY): Payer: Self-pay | Admitting: Nephrology

## 2023-10-23 ENCOUNTER — Other Ambulatory Visit: Payer: Self-pay

## 2023-10-23 DIAGNOSIS — Y839 Surgical procedure, unspecified as the cause of abnormal reaction of the patient, or of later complication, without mention of misadventure at the time of the procedure: Secondary | ICD-10-CM | POA: Insufficient documentation

## 2023-10-23 DIAGNOSIS — D631 Anemia in chronic kidney disease: Secondary | ICD-10-CM | POA: Insufficient documentation

## 2023-10-23 DIAGNOSIS — E785 Hyperlipidemia, unspecified: Secondary | ICD-10-CM | POA: Insufficient documentation

## 2023-10-23 DIAGNOSIS — N186 End stage renal disease: Secondary | ICD-10-CM | POA: Insufficient documentation

## 2023-10-23 DIAGNOSIS — Z992 Dependence on renal dialysis: Secondary | ICD-10-CM | POA: Insufficient documentation

## 2023-10-23 DIAGNOSIS — T8241XA Breakdown (mechanical) of vascular dialysis catheter, initial encounter: Secondary | ICD-10-CM | POA: Insufficient documentation

## 2023-10-23 DIAGNOSIS — I120 Hypertensive chronic kidney disease with stage 5 chronic kidney disease or end stage renal disease: Secondary | ICD-10-CM | POA: Insufficient documentation

## 2023-10-23 DIAGNOSIS — Z87891 Personal history of nicotine dependence: Secondary | ICD-10-CM | POA: Insufficient documentation

## 2023-10-23 DIAGNOSIS — T8249XA Other complication of vascular dialysis catheter, initial encounter: Secondary | ICD-10-CM | POA: Insufficient documentation

## 2023-10-23 HISTORY — PX: DIALYSIS/PERMA CATHETER REMOVAL: CATH118289

## 2023-10-23 HISTORY — PX: DIALYSIS/PERMA CATHETER INSERTION: CATH118288

## 2023-10-23 LAB — COMPREHENSIVE METABOLIC PANEL
ALT: 13 U/L (ref 0–44)
AST: 10 U/L — ABNORMAL LOW (ref 15–41)
Albumin: 3.7 g/dL (ref 3.5–5.0)
Alkaline Phosphatase: 90 U/L (ref 38–126)
Anion gap: 15 (ref 5–15)
BUN: 52 mg/dL — ABNORMAL HIGH (ref 6–20)
CO2: 22 mmol/L (ref 22–32)
Calcium: 8.8 mg/dL — ABNORMAL LOW (ref 8.9–10.3)
Chloride: 98 mmol/L (ref 98–111)
Creatinine, Ser: 11.09 mg/dL — ABNORMAL HIGH (ref 0.44–1.00)
GFR, Estimated: 4 mL/min — ABNORMAL LOW (ref >60)
Glucose, Bld: 137 mg/dL — ABNORMAL HIGH (ref 70–99)
Potassium: 5 mmol/L (ref 3.5–5.1)
Sodium: 135 mmol/L (ref 135–145)
Total Bilirubin: 0.5 mg/dL (ref 0.0–1.2)
Total Protein: 6.8 g/dL (ref 6.5–8.1)

## 2023-10-23 LAB — CBC WITH DIFFERENTIAL/PLATELET
Abs Immature Granulocytes: 0.03 10*3/uL (ref 0.00–0.07)
Basophils Absolute: 0 10*3/uL (ref 0.0–0.1)
Basophils Relative: 1 %
Eosinophils Absolute: 0.3 10*3/uL (ref 0.0–0.5)
Eosinophils Relative: 5 %
HCT: 25.9 % — ABNORMAL LOW (ref 36.0–46.0)
Hemoglobin: 8.5 g/dL — ABNORMAL LOW (ref 12.0–15.0)
Immature Granulocytes: 0 %
Lymphocytes Relative: 13 %
Lymphs Abs: 0.9 10*3/uL (ref 0.7–4.0)
MCH: 28.9 pg (ref 26.0–34.0)
MCHC: 32.8 g/dL (ref 30.0–36.0)
MCV: 88.1 fL (ref 80.0–100.0)
Monocytes Absolute: 0.5 10*3/uL (ref 0.1–1.0)
Monocytes Relative: 7 %
Neutro Abs: 5.4 10*3/uL (ref 1.7–7.7)
Neutrophils Relative %: 74 %
Platelets: 146 10*3/uL — ABNORMAL LOW (ref 150–400)
RBC: 2.94 MIL/uL — ABNORMAL LOW (ref 3.87–5.11)
RDW: 15.1 % (ref 11.5–15.5)
WBC: 7.2 10*3/uL (ref 4.0–10.5)
nRBC: 0 % (ref 0.0–0.2)

## 2023-10-23 SURGERY — DIALYSIS/PERMA CATHETER INSERTION
Anesthesia: LOCAL

## 2023-10-23 MED ORDER — FENTANYL CITRATE (PF) 100 MCG/2ML IJ SOLN
INTRAMUSCULAR | Status: DC | PRN
  Administered 2023-10-23: 50 ug via INTRAVENOUS

## 2023-10-23 MED ORDER — HEPARIN SODIUM (PORCINE) 1000 UNIT/ML IJ SOLN
INTRAMUSCULAR | Status: AC
Start: 2023-10-23 — End: ?
  Filled 2023-10-23: qty 10

## 2023-10-23 MED ORDER — SODIUM CHLORIDE 0.9 % IV SOLN: INTRAVENOUS | Status: DC

## 2023-10-23 MED ORDER — MIDAZOLAM HCL 2 MG/2ML IJ SOLN
INTRAMUSCULAR | Status: AC
  Filled 2023-10-23: qty 2

## 2023-10-23 MED ORDER — HEPARIN (PORCINE) IN NACL 1000-0.9 UT/500ML-% IV SOLN
INTRAVENOUS | Status: DC | PRN
  Administered 2023-10-23: 500 mL

## 2023-10-23 MED ORDER — FENTANYL CITRATE (PF) 100 MCG/2ML IJ SOLN
INTRAMUSCULAR | Status: AC
  Filled 2023-10-23: qty 2

## 2023-10-23 MED ORDER — HEPARIN SODIUM (PORCINE) 1000 UNIT/ML IJ SOLN
INTRAMUSCULAR | Status: DC | PRN
  Administered 2023-10-23: 3800 [IU] via INTRAVENOUS

## 2023-10-23 MED ORDER — LIDOCAINE HCL (PF) 1 % IJ SOLN
INTRAMUSCULAR | Status: AC
  Filled 2023-10-23: qty 30

## 2023-10-23 MED ORDER — MIDAZOLAM HCL 2 MG/2ML IJ SOLN
INTRAMUSCULAR | Status: DC | PRN
  Administered 2023-10-23: 1 mg via INTRAVENOUS

## 2023-10-23 MED ORDER — IODIXANOL 320 MG/ML IV SOLN
INTRAVENOUS | Status: DC | PRN
  Administered 2023-10-23: 2 mL via INTRAVENOUS

## 2023-10-23 MED ORDER — LIDOCAINE HCL (PF) 1 % IJ SOLN
INTRAMUSCULAR | Status: DC | PRN
  Administered 2023-10-23: 5 mL via SUBCUTANEOUS

## 2023-10-23 SURGICAL SUPPLY — 5 items
BAG SNAP BAND KOVER 36X36 (MISCELLANEOUS) IMPLANT
CATH PALINDROME-P 23 W/VT (CATHETERS) IMPLANT
COVER DOME SNAP 22 D (MISCELLANEOUS) IMPLANT
GUIDEWIRE ZIPWIRE 035/150 ANGL (WIRE) IMPLANT
TRAY PV CATH (CUSTOM PROCEDURE TRAY) ×1 IMPLANT

## 2023-10-23 NOTE — Discharge Instructions (Signed)
 OK to discharge home any time after 1:50PM long as clinically stable

## 2023-10-23 NOTE — ED Provider Triage Note (Addendum)
 Emergency Medicine Provider Triage Evaluation Note  [facility] Holly [name] , a 38 y.o. female  was evaluated in triage.  Pt complains of dialysis catheter that fell out, this in the afternoon.  She states that Dr. Allena Katz, placed a dialysis catheter today, and it fell out again.  This is the fourth time in desponded past few months.  She would like this further evaluated  Review of Systems  Positive: Dialysis catheter issue Negative: Fevers  Physical Exam  BP (!) 145/87 (BP Location: Right Arm)   Pulse 90   Temp 98.2 F (36.8 C)   Resp 18   Ht 5\' 7"  (1.702 m)   Wt 94.5 kg   LMP 03/31/2022 (Exact Date) Comment: pt bleeding last 6 weeks with recent multiple transfusions  SpO2 100%   BMI 32.63 kg/m  Gen:   Awake, no distress   Resp:  Normal effort  MSK:   Moves extremities without difficulty  Other:  Dialysis catheter, hanging out of the left upper chest  Medical Decision Making  Medically screening exam initiated at 8:40 PM.  Appropriate orders placed.  [facility] Holly [name] was informed that the remainder of the evaluation will be completed by another provider, this initial triage assessment does not replace that evaluation, and the importance of remaining in the ED until their evaluation is complete.  Paged Dr. Malen Gauze waiting for reply   Dolphus Jenny, [place], [place] 10/23/23 2045  Spoke with Dr. Malen Gauze, she requests vascular be involved as she will not fix vascular access area. She recommends Chest CT and labs for further eval.   Pete Pelt, [place] 10/23/23 2130

## 2023-10-23 NOTE — ED Notes (Signed)
Pa seeing the pt 

## 2023-10-23 NOTE — ED Triage Notes (Signed)
 The pt had a dialysis catheter placed in her rt upper chest this am  and when she was doing her hemodialysis at home afterward she was on the machine for 2 hours and the machine stopped working  when she took the bandage off the catheter was out.

## 2023-10-23 NOTE — Interval H&P Note (Signed)
 History and Physical Interval Note:  10/23/2023 12:55 PM  [facility] Holly [name]  has presented today for surgery, with the diagnosis of poor function in spite of recent replacement and angioplasty of CV stenosis.  The various methods of treatment have been discussed with the patient and family. After consideration of risks, benefits and other options for treatment, the patient has consented to  Procedure(s): DIALYSIS/PERMA CATHETER INSERTION (N/A) as a surgical intervention.  The patient's history has been reviewed, patient examined, no change in status, stable for surgery.  I have reviewed the patient's chart and labs.  Questions were answered to the patient's satisfaction.     Dagoberto Ligas

## 2023-10-23 NOTE — ED Notes (Signed)
 Pa notified of need to be seen

## 2023-10-23 NOTE — ED Provider Notes (Signed)
 [place] EMERGENCY DEPARTMENT AT [facility] Provider Note   CSN: [id number] Arrival date & time: 10/23/23  1934     History  Chief Complaint  Patient presents with   Vascular Access Problem    [facility] Holly [name] is a 38 y.o. female.  The history is provided by the patient and medical records.   38 year old female with history of ESRD on HD, hypertension, anemia, presenting to the ED for issues with her dialysis catheter.  She had new catheter placed today with cuff by Dr. Allena Katz.  Procedure went well without any noted complications.  States she went home and tried to put herself on the machine, got about 2 hours worth of treatment before she started getting error messages.  States she tried to flush the system back, then started having pain in her right chest.  She lifted her bandage and noticed that her catheter was retracted out to the point cuff was visible.  Of note, has left AVF in forearm that was done Feb 2024, however still not ready to use due to poor maturation.  Home Medications Prior to Admission medications   Medication Sig Start Date End Date Taking? Authorizing Provider  acyclovir (ZOVIRAX) 200 MG capsule Take 200 mg by mouth 2 (two) times daily as needed (out breaks). 08/14/21   [provider]  albuterol (VENTOLIN HFA) 108 (90 Base) MCG/ACT inhaler Inhale 2-4 puffs into the lungs every 4 (four) hours as needed for wheezing (or cough). Patient not taking: Reported on 09/25/2023 12/04/22   Hetty Blend L, NP-C  atomoxetine (STRATTERA) 10 MG capsule Take 1 capsule (10 mg total) by mouth 2 (two) times daily with a meal. 09/15/23   Arfeen, Phillips Grout, MD  carvedilol (COREG) 12.5 MG tablet Take 12.5 mg by mouth 2 (two) times daily with a meal.    [provider]  cetirizine (ZYRTEC) 10 MG tablet Take 10 mg by mouth at bedtime.    [provider]  cinacalcet (SENSIPAR) 60 MG tablet Take 60 mg by mouth at bedtime.    [provider]  diclofenac Sodium (VOLTAREN) 1 % GEL Apply 1 application  topically 4 (four) times daily as needed (hand,feet,thighs and back pain).    [provider]  epoetin alfa (EPOGEN) 20000 UNIT/ML injection Inject 20,000 Units into the skin every 14 (fourteen) days.    [provider]  escitalopram (LEXAPRO) 10 MG tablet Take 1 tablet (10 mg total) by mouth daily. Patient not taking: Reported on 04/13/2023 02/09/23   Arfeen, Phillips Grout, MD  FOSRENOL 1000 MG chewable tablet Chew 1,000 mg by mouth 3 (three) times daily with meals.    [provider]  HYDROcodone-acetaminophen (NORCO/VICODIN) 5-325 MG tablet Take 1 tablet by mouth 4 (four) times daily as needed for pain. Patient taking differently: Take 1 tablet by mouth 4 (four) times daily as needed (Pain management). 08/27/23     HYDROcodone-acetaminophen (NORCO/VICODIN) 5-325 MG tablet Take 1 tablet by mouth 4 (four) times daily as needed for pain. 08/28/23     HYDROcodone-acetaminophen (NORCO/VICODIN) 5-325 MG tablet Take 1 tablet by mouth 4 (four) times daily as needed for pain 10/27/23     HYDROcodone-acetaminophen (NORCO/VICODIN) 5-325 MG tablet Take 1 tablet by mouth 4 (four) times daily as needed for pain 11/25/23     iron sucrose (VENOFER) 20 MG/ML injection Inject 1,000 mg into the vein every 30 (thirty) days.    [provider]  levothyroxine (SYNTHROID) 200 MCG tablet Take  200 mcg by mouth at bedtime.    [provider]  losartan (COZAAR) 100 MG tablet Take 100 mg by mouth daily.    [provider]      Allergies    Ciprofloxacin, Levetiracetam, Oxycodone-acetaminophen, Rituximab, and Tramadol    Review of Systems   Review of Systems  Constitutional:        HD catheter problem  All other systems reviewed and are negative.   Physical Exam Updated Vital Signs BP (!) 145/87 (BP Location: Right Arm)   Pulse 90   Temp 98.2 F (36.8 C)   Resp 18   Ht 5\' 7"  (1.702 m)   Wt 94.5 kg    LMP 03/31/2022 (Exact Date) Comment: pt bleeding last 6 weeks with recent multiple transfusions  SpO2 100%   BMI 32.63 kg/m   Physical Exam Vitals and nursing note reviewed.  Constitutional:      Appearance: She is well-developed.  HENT:     Head: Normocephalic and atraumatic.  Eyes:     Conjunctiva/sclera: Conjunctivae normal.     Pupils: Pupils are equal, round, and reactive to light.  Cardiovascular:     Rate and Rhythm: Normal rate and regular rhythm.     Heart sounds: Normal heart sounds.  Pulmonary:     Effort: Pulmonary effort is normal.     Breath sounds: Normal breath sounds.  Chest:     Comments: HD catheter present however had been retracted from skin, cuff is now visible Abdominal:     General: Bowel sounds are normal.     Palpations: Abdomen is soft.  Musculoskeletal:        General: Normal range of motion.     Cervical back: Normal range of motion.     Comments: Fistula lower left arm, pulsation present   Skin:    General: Skin is warm and dry.  Neurological:     Mental Status: She is alert and oriented to person, place, and time.     ED Results / Procedures / Treatments   Labs (all labs ordered are listed, but only abnormal results are displayed) Labs Reviewed  CBC WITH DIFFERENTIAL/PLATELET - Abnormal; Notable for the following components:      Result Value   RBC 2.94 (*)    Hemoglobin 8.5 (*)    HCT 25.9 (*)    Platelets 146 (*)    All other components within normal limits  COMPREHENSIVE METABOLIC PANEL - Abnormal; Notable for the following components:   Glucose, Bld 137 (*)    BUN 52 (*)    Creatinine, Ser 11.09 (*)    Calcium 8.8 (*)    AST 10 (*)    GFR, Estimated 4 (*)    All other components within normal limits    EKG None  Radiology PERIPHERAL VASCULAR CATHETERIZATION Result Date: 10/23/2023   on 10/23/2023.   Okay to discharge home anytime after 1:50 PM as long as clinically stable   No indication for antiplatelet therapy at this  time . Patient presents with poor flows in her right IJ tunneled hemodialysis catheter (19 cm cuff to tip- Palindrome) that was placed here 3 weeks ago. On examination, aspiration from the venous port is sluggish with some difficulty flushing the arterial port. Chest x-ray confirms the catheter tip is appropriately positioned in the RA. The catheter cuff is 1/2cm deep to the exit site.   Summary: 1)The patient had a successful 23 cm CTT Palindrome hemodialysis catheter exchange in the right internal  jugular vein. 2)No sheath noted through either port. 3)Okay to use catheter immediately.  Description of procedure: The right neck, chest and the catheter were prepped and draped in the usual sterile fashion. The exit site and adjacent tunnel tract were anesthetized with lidocaine 1% with epinephrine. The cuff was dissected free with a curved Kelly and manual traction. The catheter was withdrawn and venogram through each of the ports was performed; there was no fibrin sheath evident through either port.  A hydrophilic wire was manipulated and advanced through the arterial port and the tip was parked in the IVC. The catheter was completely removed and noted to be entirely intact. A new 23 cm cuff to tip Palindrome catheter was inserted over the guidewire and the tip parked in the inferior vena cava and at that point the cuff was approximately 1 cm deep to the exit site.  Aspiration and flushing of both limbs of the catheter confirmed excellent flow. No kinks were visible on fluoroscopic imaging. Both limbs of the catheter were locked with citrate and sterile caps were placed.  The hub was secured on to the chest wall with 2-0 nylon wing sutures.  Sterile dressings were placed, and the patient returned to recovery in stable condition.  Sedation: 1 mg Versed, 50 mcg Fentanyl Sedation time: 21 minutes  Contrast: 2 mL  Monitoring: Because of the patient's comorbid conditions and sedation during the procedure, continuous EKG  monitoring and O2 saturation monitoring was performed throughout the procedure by the RN. There were no abnormal arrhythmias encountered.  Complications: None.  Diagnoses:  T82.49XA Other complication of vascular dialysis catheter (Poor flows) N18.6 End stage renal disease Z99.2 Dialysis dependence  Procedures Coding: 16109 Tunneled catheter exchange 77001  Fluoroscopy guidance for catheter exchange. U0454 Contrast  Recommendations: Remove the suture in 3 weeks. 2.   Report any blood flow problems to [facility].  Discharge: The patient was discharged home in stable condition. The patient was given education regarding the care of the catheter and specific instructions in case of any problems.    Procedures Procedures    Medications Ordered in ED Medications - No data to display  ED Course/ Medical Decision Making/ A&P                                 Medical Decision Making  38 y.o. F here needing new HD catheter.  Had line placed this morning by Dr. Allena Katz, was functioning fine immediately after procedure.  Was getting airs on her home HD machine, felt some tugging in her upper chest and noticed that her line had been pulled out from the chest wall.    On exam, line has been retracted from the skin, cuff is visible.  Has left forearm AVF, pulsation noted.  Reportedly this is not mature enough yet for HD.  PIT provider spoke with nephology, Dr. Malen Gauze who recommended vascular surgery involvement as she is unable to do procedure for this.  11:30 PM Spoke with RN in with Dr. Myra Gianotti-- in OR with patient right now (lengthy procedure anticipated).  Will call back once surgery completed.  Patient has remained stable through the night.  No ETA from vascular. Have ordered IR eval for morning to have new catheter placed.  Care signed out to oncoming provider.  Anticipate discharge once new HD catheter placed.  Final Clinical Impression(s) / ED Diagnoses Final diagnoses:  Hemodialysis catheter  dysfunction, initial encounter (HCC)  Rx / DC Orders ED Discharge Orders     None         Garlon Hatchet, PA-C 10/24/23 1610    Tegeler, Canary Brim, MD 10/25/23 [id number]

## 2023-10-23 NOTE — Op Note (Signed)
 Patient presents with poor flows in her right IJ tunneled hemodialysis catheter (19 cm cuff to tip- Palindrome) that was placed here 3 weeks ago. On examination, aspiration from the venous port is sluggish with some difficulty flushing the arterial port. Chest x-ray confirms the catheter tip is appropriately positioned in the RA. The catheter cuff is 1/2cm deep to the exit site.    Summary:  1) The patient had a successful 23 cm CTT Palindrome hemodialysis catheter exchange in the right internal jugular vein. 2) No sheath noted through either port. 3) Okay to use catheter immediately.  Description of procedure: The right neck, chest and the catheter were prepped and draped in the usual sterile fashion. The exit site and adjacent tunnel tract were anesthetized with lidocaine 1% with epinephrine. The cuff was dissected free with a curved Kelly and manual traction. The catheter was withdrawn and venogram through each of the ports was performed; there was no fibrin sheath evident through either port.   A hydrophilic wire was manipulated and advanced through the arterial port and the tip was parked in the IVC. The catheter was completely removed and noted to be entirely intact. A new 23 cm cuff to tip Palindrome catheter was inserted over the guidewire and the tip parked in the inferior vena cava and at that point the cuff was approximately 1 cm deep to the exit site.   Aspiration and flushing of both limbs of the catheter confirmed excellent flow. No kinks were visible on fluoroscopic imaging. Both limbs of the catheter were locked with citrate and sterile caps were placed.   The hub was secured on to the chest wall with 2-0 nylon wing sutures.  Sterile dressings were placed, and the patient returned to recovery in stable condition.  Sedation: 1 mg Versed, 50 mcg Fentanyl Sedation time: 21 minutes  Contrast: 2 mL  Monitoring: Because of the patient's comorbid conditions and sedation during the  procedure, continuous EKG monitoring and O2 saturation monitoring was performed throughout the procedure by the RN. There were no abnormal arrhythmias encountered.  Complications: None.  Diagnoses:   T82.49XA Other complication of vascular dialysis catheter (Poor flows) N18.6 End stage renal disease  Z99.2 Dialysis dependence  Procedures Coding:  36581 Tunneled catheter exchange 77001  Fluoroscopy guidance for catheter exchange. W0981 Contrast  Recommendations: Remove the suture in 3 weeks. 2.   Report any blood flow problems to [facility].  Discharge: The patient was discharged home in stable condition. The patient was given education regarding the care of the catheter and specific instructions in case of any problems.

## 2023-10-24 ENCOUNTER — Emergency Department (HOSPITAL_COMMUNITY)

## 2023-10-24 DIAGNOSIS — T8249XA Other complication of vascular dialysis catheter, initial encounter: Secondary | ICD-10-CM | POA: Diagnosis not present

## 2023-10-24 MED ORDER — LIDOCAINE-EPINEPHRINE 1 %-1:100000 IJ SOLN
20.0000 mL | Freq: Once | INTRAMUSCULAR | Status: AC
  Administered 2023-10-24: 10 mL

## 2023-10-24 MED ORDER — CEFAZOLIN SODIUM-DEXTROSE 2-4 GM/100ML-% IV SOLN
2.0000 g | Freq: Once | INTRAVENOUS | Status: AC
  Administered 2023-10-24: 2 g via INTRAVENOUS

## 2023-10-24 MED ORDER — HEPARIN SODIUM (PORCINE) 1000 UNIT/ML IJ SOLN
3800.0000 [IU] | Freq: Once | INTRAMUSCULAR | Status: AC
  Administered 2023-10-24: 3800 [IU] via INTRAVENOUS

## 2023-10-24 MED ORDER — MIDAZOLAM HCL 2 MG/2ML IJ SOLN
INTRAMUSCULAR | Status: AC
  Filled 2023-10-24: qty 4

## 2023-10-24 MED ORDER — LIDOCAINE-EPINEPHRINE 1 %-1:100000 IJ SOLN
INTRAMUSCULAR | Status: AC
  Filled 2023-10-24: qty 1

## 2023-10-24 MED ORDER — HEPARIN SODIUM (PORCINE) 1000 UNIT/ML IJ SOLN
INTRAMUSCULAR | Status: AC
  Filled 2023-10-24: qty 10

## 2023-10-24 MED ORDER — LIDOCAINE HCL 1 % IJ SOLN
INTRAMUSCULAR | Status: AC
  Filled 2023-10-24: qty 20

## 2023-10-24 MED ORDER — FENTANYL CITRATE (PF) 100 MCG/2ML IJ SOLN
INTRAMUSCULAR | Status: AC | PRN
  Administered 2023-10-24 (×2): 50 ug via INTRAVENOUS

## 2023-10-24 MED ORDER — MIDAZOLAM HCL 2 MG/2ML IJ SOLN
INTRAMUSCULAR | Status: AC | PRN
  Administered 2023-10-24 (×2): 1 mg via INTRAVENOUS

## 2023-10-24 MED ORDER — CEFAZOLIN SODIUM-DEXTROSE 2-4 GM/100ML-% IV SOLN
INTRAVENOUS | Status: AC
  Filled 2023-10-24: qty 100

## 2023-10-24 MED ORDER — FENTANYL CITRATE (PF) 100 MCG/2ML IJ SOLN
INTRAMUSCULAR | Status: AC
  Filled 2023-10-24: qty 4

## 2023-10-24 NOTE — Discharge Instructions (Addendum)
 Follow-up with nephrology. Return here for new concerns.

## 2023-10-24 NOTE — Consult Note (Addendum)
 [facility] Renal Consultation Note    Indication for Consultation:  Management of ESRD/hemodialysis; anemia, hypertension/volume and secondary hyperparathyroidism  ZOX:WRUEAV, [name], NP-C  HPI: Holly [name] is a 38 y.o. 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 Thedore Mins with [facility].  Past medical history significant for HLD, HTN, hypothyroidism, anemia of CKD and epilepsy.  Patient seen and examined at bedside. Presented to ED due to [facility] malfunction.  Reports is was replaced here yesterday and was working well at home for the 1st 2 hours of treatment.  The machine began to alarm and she rinsed back and then noticed the cuff was showing.  Reports it has been exchanged 4 times in the last 2 months due to the catheter not functioning.  States she has not had good dialysis during that time.  Often has been unable to rinse back her blood which she believes is worsening anemia.  Overall she feels ok other than frustration over the situation and being NPO while waiting for a new catheter.  She has a fistula in her L arm that needs to be "raised" as well as having an area of stenosis.  States she tried to return to previous Careers adviser but they do not do this procedure.  Believes she is now being referred to a Careers adviser in [place] or possibly [facility].  Denies CP, SOB, abdominal pain and n/v/d.    Provider in ED have consulted Vascular and IR for new [facility] placement.  Pertinent labs include K 5.0, BUN 52, SCr 11 and Hgb 8.5.   Past Medical History:  Diagnosis Date   Anemia    Anxiety    Arthritis    hands   Asthmatic bronchitis 02/11/2023   Blood transfusion without reported diagnosis    Cancer (HCC)    Complication of anesthesia    with Fentanyl she is difficult to awake   Depression    Dialysis patient (HCC)    Dyspnea    due to anemia   End stage renal disease (HCC)    Epilepsy (HCC)    Hashimoto's disease    History of kidney stones    HLD (hyperlipidemia)     Hypertension    Hypothyroidism    Membranoproliferative glomerulonephritis type 2, idiopathic    Pneumonia    Renal disorder    Thyroid disease    Past Surgical History:  Procedure Laterality Date   A/V FISTULAGRAM Left 09/09/2022   Procedure: A/V Fistulagram;  Surgeon: Renford Dills, MD;  Location: [facility] INVASIVE CV LAB;  Service: Cardiovascular;  Laterality: Left;   ABDOMINAL HYSTERECTOMY     ABDOMINAL SURGERY     AV FISTULA PLACEMENT Left 01/17/2022   Procedure: ARTERIOVENOUS (AV) FISTULA CREATION ( RADIAL CEPHALIC);  Surgeon: Renford Dills, MD;  Location: [facility] ORS;  Service: Vascular;  Laterality: Left;   CERVICAL BIOPSY  W/ LOOP ELECTRODE EXCISION  2018   COLONOSCOPY N/A 01/30/2022   Procedure: COLONOSCOPY;  Surgeon: Jaynie Collins, DO;  Location: [facility] ENDOSCOPY;  Service: Gastroenterology;  Laterality: N/A;   COLONOSCOPY WITH PROPOFOL N/A 02/01/2022   Procedure: COLONOSCOPY WITH PROPOFOL;  Surgeon: Regis Bill, MD;  Location: [facility] ENDOSCOPY;  Service: Endoscopy;  Laterality: N/A;   DIAGNOSTIC LAPAROSCOPY     due to removal of PD catheters   DIALYSIS/PERMA CATHETER INSERTION N/A 06/09/2022   Procedure: DIALYSIS/PERMA CATHETER INSERTION;  Surgeon: Annice Needy, MD;  Location: [facility] INVASIVE CV LAB;  Service: Cardiovascular;  Laterality: N/A;   DIALYSIS/PERMA  CATHETER INSERTION N/A 06/30/2022   Procedure: DIALYSIS/PERMA CATHETER INSERTION;  Surgeon: Cephus Shelling, MD;  Location: [facility] INVASIVE CV LAB;  Service: Cardiovascular;  Laterality: N/A;   DIALYSIS/PERMA CATHETER INSERTION N/A 09/28/2023   Procedure: DIALYSIS/PERMA CATHETER INSERTION;  Surgeon: Tyler Pita, MD;  Location: [facility] INVASIVE CV LAB;  Service: Cardiovascular;  Laterality: N/A;   DIALYSIS/PERMA CATHETER INSERTION N/A 09/30/2023   Procedure: DIALYSIS/PERMA CATHETER INSERTION;  Surgeon: Ethelene Hal, MD;  Location: [facility] INVASIVE CV LAB;  Service: Cardiovascular;  Laterality: N/A;   DIALYSIS/PERMA  CATHETER INSERTION N/A 10/23/2023   Procedure: DIALYSIS/PERMA CATHETER INSERTION;  Surgeon: Dagoberto Ligas, MD;  Location: [facility] INVASIVE CV LAB;  Service: Cardiovascular;  Laterality: N/A;   DIALYSIS/PERMA CATHETER REMOVAL N/A 09/28/2023   Procedure: DIALYSIS/PERMA CATHETER REMOVAL;  Surgeon: Tyler Pita, MD;  Location: [facility] INVASIVE CV LAB;  Service: Cardiovascular;  Laterality: N/A;   DIALYSIS/PERMA CATHETER REMOVAL N/A 10/23/2023   Procedure: DIALYSIS/PERMA CATHETER REMOVAL;  Surgeon: Dagoberto Ligas, MD;  Location[facility] INVASIVE CV LAB;  Service: Cardiovascular;  Laterality: N/A;   ESOPHAGOGASTRODUODENOSCOPY N/A 01/30/2022   Procedure: ESOPHAGOGASTRODUODENOSCOPY (EGD);  Surgeon: Jaynie Collins, DO;  Location: [facility] ENDOSCOPY;  Service: Gastroenterology;  Laterality: N/A;   ESOPHAGOGASTRODUODENOSCOPY (EGD) WITH PROPOFOL N/A 02/01/2022   Procedure: ESOPHAGOGASTRODUODENOSCOPY (EGD) WITH PROPOFOL;  Surgeon: Regis Bill, MD;  Location: [facility] ENDOSCOPY;  Service: Endoscopy;  Laterality: N/A;   PERIPHERAL VASCULAR BALLOON ANGIOPLASTY Right 09/28/2023   Procedure: PERIPHERAL VASCULAR BALLOON ANGIOPLASTY;  Surgeon: Tyler Pita, MD;  Location: [facility] INVASIVE CV LAB;  Service: Cardiovascular;  Laterality: Right;  innominate sheath   RENAL BIOPSY     x 2   TUMOR REMOVAL     right face   UTERINE ARTERY EMBOLIZATION  05/04/2022   Family History  Problem Relation Age of Onset   Multiple sclerosis Mother    Cancer Father    COPD Father    Social History:  reports that she quit smoking about 2 years ago. Her smoking use included cigarettes. She has never used smokeless tobacco. She reports that she does not drink alcohol and does not use drugs. Allergies  Allergen Reactions   Ciprofloxacin Anaphylaxis and Other (See Comments)    Caused Seizures   Seizures   Other reaction(s): Other (See Comments)  Seizures  Seizures     Caused Seizures  Seizures  Seizures  Other reaction(s): Other (See  Comments) Seizures  Seizures  Seizures     Other reaction(s): Other (See Comments) Seizures  Seizures  Seizures     Caused Seizures  Caused Seizures  Seizures  Seizures  Other reaction(s): Other (See Comments) Seizures  Seizures  Seizures    Seizures Grand mal   Levetiracetam Anaphylaxis and Other (See Comments)    Medical Record H&P (unknown reaction)  Medical Record H&P  Other reaction(s): Other (See Comments)   Oxycodone-Acetaminophen Nausea Only and Other (See Comments)   Rituximab Anaphylaxis and Swelling   Tramadol Anaphylaxis and Other (See Comments)    Caused seizures  Other reaction(s): Other (See Comments)  Medical Record H&P  Medical Record H&P    Caused seizures   Prior to Admission medications   Medication Sig Start Date End Date Taking? Authorizing Provider  acyclovir (ZOVIRAX) 200 MG capsule Take 200 mg by mouth 2 (two) times daily as needed (out breaks). 08/14/21   [provider]  albuterol (VENTOLIN HFA) 108 (90 Base) MCG/ACT inhaler Inhale 2-4 puffs into the lungs every 4 (four) hours as  needed for wheezing (or cough). Patient not taking: Reported on 09/25/2023 12/04/22   Hetty Blend L, NP-C  atomoxetine (STRATTERA) 10 MG capsule Take 1 capsule (10 mg total) by mouth 2 (two) times daily with a meal. 09/15/23   Arfeen, Phillips Grout, MD  carvedilol (COREG) 12.5 MG tablet Take 12.5 mg by mouth 2 (two) times daily with a meal.    [provider]  cetirizine (ZYRTEC) 10 MG tablet Take 10 mg by mouth at bedtime.    [provider]  cinacalcet (SENSIPAR) 60 MG tablet Take 60 mg by mouth at bedtime.    [provider]  diclofenac Sodium (VOLTAREN) 1 % GEL Apply 1 application  topically 4 (four) times daily as needed (hand,feet,thighs and back pain).    [provider]  epoetin alfa (EPOGEN) 20000 UNIT/ML injection Inject 20,000 Units into the skin every 14 (fourteen) days.    [provider]  escitalopram (LEXAPRO) 10 MG  tablet Take 1 tablet (10 mg total) by mouth daily. Patient not taking: Reported on 04/13/2023 02/09/23   Arfeen, Phillips Grout, MD  FOSRENOL 1000 MG chewable tablet Chew 1,000 mg by mouth 3 (three) times daily with meals.    [provider]  HYDROcodone-acetaminophen (NORCO/VICODIN) 5-325 MG tablet Take 1 tablet by mouth 4 (four) times daily as needed for pain. Patient taking differently: Take 1 tablet by mouth 4 (four) times daily as needed (Pain management). 08/27/23     HYDROcodone-acetaminophen (NORCO/VICODIN) 5-325 MG tablet Take 1 tablet by mouth 4 (four) times daily as needed for pain. 08/28/23     HYDROcodone-acetaminophen (NORCO/VICODIN) 5-325 MG tablet Take 1 tablet by mouth 4 (four) times daily as needed for pain 10/27/23     HYDROcodone-acetaminophen (NORCO/VICODIN) 5-325 MG tablet Take 1 tablet by mouth 4 (four) times daily as needed for pain 11/25/23     iron sucrose (VENOFER) 20 MG/ML injection Inject 1,000 mg into the vein every 30 (thirty) days.    [provider]  levothyroxine (SYNTHROID) 200 MCG tablet Take 200 mcg by mouth at bedtime.    [provider]  losartan (COZAAR) 100 MG tablet Take 100 mg by mouth daily.    [provider]   No current facility-administered medications for this encounter.   Current Outpatient Medications  Medication Sig Dispense Refill   acyclovir (ZOVIRAX) 200 MG capsule Take 200 mg by mouth 2 (two) times daily as needed (out breaks).     albuterol (VENTOLIN HFA) 108 (90 Base) MCG/ACT inhaler Inhale 2-4 puffs into the lungs every 4 (four) hours as needed for wheezing (or cough). (Patient not taking: Reported on 09/25/2023) 18 g 0   atomoxetine (STRATTERA) 10 MG capsule Take 1 capsule (10 mg total) by mouth 2 (two) times daily with a meal. 60 capsule 2   carvedilol (COREG) 12.5 MG tablet Take 12.5 mg by mouth 2 (two) times daily with a meal.     cetirizine (ZYRTEC) 10 MG tablet Take 10 mg by mouth at bedtime.     cinacalcet  (SENSIPAR) 60 MG tablet Take 60 mg by mouth at bedtime.     diclofenac Sodium (VOLTAREN) 1 % GEL Apply 1 application  topically 4 (four) times daily as needed (hand,feet,thighs and back pain).     epoetin alfa (EPOGEN) 20000 UNIT/ML injection Inject 20,000 Units into the skin every 14 (fourteen) days.     escitalopram (LEXAPRO) 10 MG tablet Take 1 tablet (10 mg total) by mouth daily. (Patient not taking: Reported on  04/13/2023) 90 tablet 0   FOSRENOL 1000 MG chewable tablet Chew 1,000 mg by mouth 3 (three) times daily with meals.     HYDROcodone-acetaminophen (NORCO/VICODIN) 5-325 MG tablet Take 1 tablet by mouth 4 (four) times daily as needed for pain. (Patient taking differently: Take 1 tablet by mouth 4 (four) times daily as needed (Pain management).) 120 tablet 0   HYDROcodone-acetaminophen (NORCO/VICODIN) 5-325 MG tablet Take 1 tablet by mouth 4 (four) times daily as needed for pain. 120 tablet 0   [START ON 10/27/2023] HYDROcodone-acetaminophen (NORCO/VICODIN) 5-325 MG tablet Take 1 tablet by mouth 4 (four) times daily as needed for pain 120 tablet 0   [START ON 11/25/2023] HYDROcodone-acetaminophen (NORCO/VICODIN) 5-325 MG tablet Take 1 tablet by mouth 4 (four) times daily as needed for pain 120 tablet 0   iron sucrose (VENOFER) 20 MG/ML injection Inject 1,000 mg into the vein every 30 (thirty) days.     levothyroxine (SYNTHROID) 200 MCG tablet Take 200 mcg by mouth at bedtime.     losartan (COZAAR) 100 MG tablet Take 100 mg by mouth daily.     Labs: Basic Metabolic Panel: Recent Labs  Lab 10/23/23 2140  NA 135  K 5.0  CL 98  CO2 22  GLUCOSE 137*  BUN 52*  CREATININE 11.09*  CALCIUM 8.8*   Liver Function Tests: Recent Labs  Lab 10/23/23 2140  AST 10*  ALT 13  ALKPHOS 90  BILITOT 0.5  PROT 6.8  ALBUMIN 3.7   CBC: Recent Labs  Lab 10/23/23 2140  WBC 7.2  NEUTROABS 5.4  HGB 8.5*  HCT 25.9*  MCV 88.1  PLT 146*   Studies/Results: PERIPHERAL VASCULAR  CATHETERIZATION Result Date: 10/23/2023   on 10/23/2023.   Okay to discharge home anytime after 1:50 PM as long as clinically stable   No indication for antiplatelet therapy at this time . Patient presents with poor flows in her right IJ tunneled hemodialysis catheter (19 cm cuff to tip- Palindrome) that was placed here 3 weeks ago. On examination, aspiration from the venous port is sluggish with some difficulty flushing the arterial port. Chest x-ray confirms the catheter tip is appropriately positioned in the RA. The catheter cuff is 1/2cm deep to the exit site.   Summary: 1)The patient had a successful 23 cm CTT Palindrome hemodialysis catheter exchange in the right internal jugular vein. 2)No sheath noted through either port. 3)Okay to use catheter immediately.  Description of procedure: The right neck, chest and the catheter were prepped and draped in the usual sterile fashion. The exit site and adjacent tunnel tract were anesthetized with lidocaine 1% with epinephrine. The cuff was dissected free with a curved Kelly and manual traction. The catheter was withdrawn and venogram through each of the ports was performed; there was no fibrin sheath evident through either port.  A hydrophilic wire was manipulated and advanced through the arterial port and the tip was parked in the IVC. The catheter was completely removed and noted to be entirely intact. A new 23 cm cuff to tip Palindrome catheter was inserted over the guidewire and the tip parked in the inferior vena cava and at that point the cuff was approximately 1 cm deep to the exit site.  Aspiration and flushing of both limbs of the catheter confirmed excellent flow. No kinks were visible on fluoroscopic imaging. Both limbs of the catheter were locked with citrate and sterile caps were placed.  The hub was secured on to the chest wall with 2-0 nylon  wing sutures.  Sterile dressings were placed, and the patient returned to recovery in stable condition.   Sedation: 1 mg Versed, 50 mcg Fentanyl Sedation time: 21 minutes  Contrast: 2 mL  Monitoring: Because of the patient's comorbid conditions and sedation during the procedure, continuous EKG monitoring and O2 saturation monitoring was performed throughout the procedure by the RN. There were no abnormal arrhythmias encountered.  Complications: None.  Diagnoses:  T82.49XA Other complication of vascular dialysis catheter (Poor flows) N18.6 End stage renal disease Z99.2 Dialysis dependence  Procedures Coding: 86578 Tunneled catheter exchange 77001  Fluoroscopy guidance for catheter exchange. I6962 Contrast  Recommendations: Remove the suture in 3 weeks. 2.   Report any blood flow problems to [facility].  Discharge: The patient was discharged home in stable condition. The patient was given education regarding the care of the catheter and specific instructions in case of any problems.    ROS: All others negative except those listed in HPI.  Physical Exam: Vitals:   10/24/23 0300 10/24/23 0452 10/24/23 0500 10/24/23 0600  BP: (!) 137/113  (!) 140/90 113/60  Pulse: 84  81 80  Resp: 16  16 16   Temp:  98.4 F (36.9 C)    TempSrc:  Oral    SpO2: 100%  99% 100%  Weight:      Height:         General: WDWN female in NAD Head: NCAT sclera not icteric MMM Neck: Supple. No lymphadenopathy Lungs: CTA bilaterally. No wheeze, rales or rhonchi. Breathing is unlabored. Heart: RRR. No murmur, rubs or gallops.  Abdomen: soft Lower extremities:no edema Neuro: AAOx3. Moves all extremities spontaneously. Psych:  Responds to questions appropriately with a normal affect. Dialysis Access: R internal jugular TDC dressed  Dialysis Orders:  HHD @ [facility], Dr. Thedore Mins  Assessment/Plan: Dialysis access malfunction - multiple issues with [facility] over the last 2 months. Replaced again yesterday and slipped partially out where cuff is now showing. IR consulted and plan to replace this AM.  Appreciate their assistance. Plan for d/c  post.   ESRD -  on HHD.  Will resume home dialysis on discharge.   Hypertension/volume  - BP in goal.  Does not appear volume overloaded.   Anemia of CKD - Hgb drop 10.5>8.5 over last 5 weeks.  She reports not being able to rinse back blood on multiple occasions.  No other visible blood loss. On EPO monthly.  May need to increase dose for now.   Nutrition - NPO for procedure.   Virgina Norfolk, PA-C [facility] 10/24/2023, 8:37 AM    Seen and examined independently.  Agree with note and exam as documented above by physician extender and as noted here.  Patient ESRD on hemodialysis at home through Dr. Thedore Mins at [facility] who presented to the hospital after her new tunneled dialysis catheter came out.  She just had this catheter placed on 2/28 by [facility].  She states that she noticed that the cuff was showing yesterday when she was doing her treatment.  It has been exchanged several times in the last couple of months due to not functioning well.  Interventional radiology was consulted and placed a new tunneled catheter this morning.  She is drawing labs today per her routine and states that she also has ESA as well as iron due soon.  She states that her hemoglobin is not too far off from her usual range.  She would like to go home.  She feels well.  She just was frustrated that the situation overnight.   General adult female in bed in no acute distress HEENT normocephalic atraumatic extraocular movements intact sclera anicteric Neck supple trachea midline Lungs clear to auscultation bilaterally normal work of breathing at rest  Heart S1S2 no rub Abdomen soft nontender nondistended Extremities no edema  Psych normal mood and affect Access RIJ tunneled catheter. LUE AVF without bruit or thrill (states this is known)  # Dialysis catheter malfunction Appreciate interventional radiology A new dialysis catheter placed on 10/24/2023  # End-stage renal disease on  hemodialysis Resume home hemodialysis per established provider  # Anemia of chronic kidney disease Near her baseline and has iron and ESA due soon  # HTN  - resume home medications  Thank you for the consult please do not hesitate to contact me with any questions regarding this patient   Estanislado Emms, MD 10/24/2023  12:09 PM

## 2023-10-24 NOTE — ED Notes (Signed)
 Update given, vascular still in procedure, pt given a pillow and lights turned down for comfort, pt has call bell for anything assistance. Pt on Ipad for entertainment

## 2023-10-24 NOTE — ED Provider Notes (Cosign Needed Addendum)
 Signout from [employer] at shift change. Briefly, patient presents for issues with her dialysis catheter.  She had a new catheter placed by Dr. Allena Katz today.  No complications through the procedure.  She has had multiple catheters placed that have had to be replaced the past few weeks.  Went home and tried to flush but was unable.  Denies any current pain or discomfort.  Plan: IR will place tunneled HD catheter   Catheter placed.  [place] NP with IR confirms she is good to go from a IR standpoint.  Dr. Malen Gauze from nephrology confirmed she is good to go from a nephrology standpoint.  Reassessment performed. Patient appears comfortable in no acute distress, she is currently asymptomatic.  Abdomen is nontender.  Lung sounds are clear.  Catheter placement without any surrounding drainage, or erythema or warmth.   Labs and imaging personally reviewed and interpreted including: Creatinine at baseline of 11.09, potassium within normal limits, hemoglobin of 8.5    Most current vital signs reviewed and are as follows: BP 113/60   Pulse 80   Temp 98.4 F (36.9 C) (Oral)   Resp 16   Ht 5\' 7"  (1.702 m)   Wt 94.5 kg   LMP 03/31/2022 (Exact Date) Comment: pt bleeding last 6 weeks with recent multiple transfusions  SpO2 100%   BMI 32.63 kg/m     Plan: Will discharge home with nephrology follow-up. The patient has been appropriately medically screened and/or stabilized in the ED. I have low suspicion for any other emergent medical condition which would require further screening, evaluation or treatment in the ED or require inpatient management. At time of discharge the patient is hemodynamically stable and in no acute distress. I have discussed work-up results and diagnosis with patient and answered all questions. Patient is agreeable with discharge plan. We discussed strict return precautions for returning to the emergency department and they verbalized understanding.      Halford Decamp,  PA-C 10/24/23 1159    Halford Decamp, PA-C 10/24/23 1201    Gilda Crease, MD 10/25/23 [phone]

## 2023-10-24 NOTE — Consult Note (Signed)
 Chief Complaint: History of ESRD with malpositioned tunneled dialysis catheter. Request is for tunneled dialysis catheter [name]   Referring Physician(s): Dr.Lori Malen Gauze  Supervising Physician: Marliss Coots  Patient Status: [facility] - ED  History of Present Illness: Holly [name] is a 38 y.o. female (in the ED). History of HLD, HTN, hypothyroidism, ESRD on home HD. Presented to the ED with a malpositioned tunneled HD catheter. Patient had the [facility] exchanged on 2.28.25 Interventional nephrology (formerly [facility]) on 2.28.25 Patient attempted home HD and found the catheter is pulled out and the cuff is now exposed. Per Patient the "hole is too big" and the catheter has had to be replaced 4 times in 2 months. Team is requesting a new TDC [name].   Patient alert sitting up in bed. Tearful. Endorses "normal aches and pains? Denies any fevers, headache, chest pain, SOB, cough, abdominal pain, nausea, vomiting or bleeding.   Per EPIC Patient has a  23 cm palindrome RIJ. No imaging BUN 52,  Cr 11.09, GFR < 4. No pertinent allergies. Patient states that she last ate at 22:45  Return precautions and treatment recommendations and follow-up discussed with the patient  who is agreeable with the plan.       Past Medical History:  Diagnosis Date   Anemia    Anxiety    Arthritis    hands   Asthmatic bronchitis 02/11/2023   Blood transfusion without reported diagnosis    Cancer (HCC)    Complication of anesthesia    with Fentanyl she is difficult to awake   Depression    Dialysis patient (HCC)    Dyspnea    due to anemia   End stage renal disease (HCC)    Epilepsy (HCC)    Hashimoto's disease    History of kidney stones    HLD (hyperlipidemia)    Hypertension    Hypothyroidism    Membranoproliferative glomerulonephritis type 2, idiopathic    Pneumonia    Renal disorder    Thyroid disease     Past Surgical History:  Procedure Laterality Date   A/V  FISTULAGRAM Left 09/09/2022   Procedure: A/V Fistulagram;  Surgeon: Renford Dills, MD;  Location: [facility] INVASIVE CV LAB;  Service: Cardiovascular;  Laterality: Left;   ABDOMINAL HYSTERECTOMY     ABDOMINAL SURGERY     AV FISTULA [name] Left 01/17/2022   Procedure: ARTERIOVENOUS (AV) FISTULA CREATION ( RADIAL CEPHALIC);  Surgeon: Renford Dills, MD;  Location: [facility] ORS;  Service: Vascular;  Laterality: Left;   CERVICAL BIOPSY  W/ LOOP ELECTRODE EXCISION  2018   COLONOSCOPY N/A 01/30/2022   Procedure: COLONOSCOPY;  Surgeon: Jaynie Collins, DO;  Location: [facility] ENDOSCOPY;  Service: Gastroenterology;  Laterality: N/A;   COLONOSCOPY WITH PROPOFOL N/A 02/01/2022   Procedure: COLONOSCOPY WITH PROPOFOL;  Surgeon: Regis Bill, MD;  Location: [facility] ENDOSCOPY;  Service: Endoscopy;  Laterality: N/A;   DIAGNOSTIC LAPAROSCOPY     due to removal of PD catheters   DIALYSIS/PERMA CATHETER INSERTION N/A 06/09/2022   Procedure: DIALYSIS/PERMA CATHETER INSERTION;  Surgeon: Annice Needy, MD;  Location: [facility] INVASIVE CV LAB;  Service: Cardiovascular;  Laterality: N/A;   DIALYSIS/PERMA CATHETER INSERTION N/A 06/30/2022   Procedure: DIALYSIS/PERMA CATHETER INSERTION;  Surgeon: Cephus Shelling, MD;  Location: [facility] INVASIVE CV LAB;  Service: Cardiovascular;  Laterality: N/A;   DIALYSIS/PERMA CATHETER INSERTION N/A 09/28/2023   Procedure: DIALYSIS/PERMA CATHETER INSERTION;  Surgeon: Tyler Pita, MD;  Location: [facility] INVASIVE CV LAB;  Service: Cardiovascular;  Laterality: N/A;   DIALYSIS/PERMA CATHETER INSERTION N/A 09/30/2023   Procedure: DIALYSIS/PERMA CATHETER INSERTION;  Surgeon: Ethelene Hal, MD;  Location: [facility] INVASIVE CV LAB;  Service: Cardiovascular;  Laterality: N/A;   DIALYSIS/PERMA CATHETER INSERTION N/A 10/23/2023   Procedure: DIALYSIS/PERMA CATHETER INSERTION;  Surgeon: Dagoberto Ligas, MD;  Location: [facility] INVASIVE CV LAB;  Service: Cardiovascular;  Laterality: N/A;   DIALYSIS/PERMA CATHETER  REMOVAL N/A 09/28/2023   Procedure: DIALYSIS/PERMA CATHETER REMOVAL;  Surgeon: Tyler Pita, MD;  Location: [facility] INVASIVE CV LAB;  Service: Cardiovascular;  Laterality: N/A;   DIALYSIS/PERMA CATHETER REMOVAL N/A 10/23/2023   Procedure: DIALYSIS/PERMA CATHETER REMOVAL;  Surgeon: Dagoberto Ligas, MD;  Location: [facility] INVASIVE CV LAB;  Service: Cardiovascular;  Laterality: N/A;   ESOPHAGOGASTRODUODENOSCOPY N/A 01/30/2022   Procedure: ESOPHAGOGASTRODUODENOSCOPY (EGD);  Surgeon: Jaynie Collins, DO;  Location: [facility] ENDOSCOPY;  Service: Gastroenterology;  Laterality: N/A;   ESOPHAGOGASTRODUODENOSCOPY (EGD) WITH PROPOFOL N/A 02/01/2022   Procedure: ESOPHAGOGASTRODUODENOSCOPY (EGD) WITH PROPOFOL;  Surgeon: Regis Bill, MD;  Location: [facility] ENDOSCOPY;  Service: Endoscopy;  Laterality: N/A;   PERIPHERAL VASCULAR BALLOON ANGIOPLASTY Right 09/28/2023   Procedure: PERIPHERAL VASCULAR BALLOON ANGIOPLASTY;  Surgeon: Tyler Pita, MD;  Location: [facility] INVASIVE CV LAB;  Service: Cardiovascular;  Laterality: Right;  innominate sheath   RENAL BIOPSY     x 2   TUMOR REMOVAL     right face   UTERINE ARTERY EMBOLIZATION  05/04/2022    Allergies: Ciprofloxacin, Levetiracetam, Oxycodone-acetaminophen, Rituximab, and Tramadol  Medications: Prior to Admission medications   Medication Sig Start Date End Date Taking? Authorizing Provider  acyclovir (ZOVIRAX) 200 MG capsule Take 200 mg by mouth 2 (two) times daily as needed (out breaks). 08/14/21   [provider]  albuterol (VENTOLIN HFA) 108 (90 Base) MCG/ACT inhaler Inhale 2-4 puffs into the lungs every 4 (four) hours as needed for wheezing (or cough). Patient not taking: Reported on 09/25/2023 12/04/22   Hetty Blend L, NP-C  atomoxetine (STRATTERA) 10 MG capsule Take 1 capsule (10 mg total) by mouth 2 (two) times daily with a meal. 09/15/23   Arfeen, Phillips Grout, MD  carvedilol (COREG) 12.5 MG tablet Take 12.5 mg by mouth 2 (two) times daily with a  meal.    [provider]  cetirizine (ZYRTEC) 10 MG tablet Take 10 mg by mouth at bedtime.    [provider]  cinacalcet (SENSIPAR) 60 MG tablet Take 60 mg by mouth at bedtime.    [provider]  diclofenac Sodium (VOLTAREN) 1 % GEL Apply 1 application  topically 4 (four) times daily as needed (hand,feet,thighs and back pain).    [provider]  epoetin alfa (EPOGEN) 20000 UNIT/ML injection Inject 20,000 Units into the skin every 14 (fourteen) days.    [provider]  escitalopram (LEXAPRO) 10 MG tablet Take 1 tablet (10 mg total) by mouth daily. Patient not taking: Reported on 04/13/2023 02/09/23   Arfeen, Phillips Grout, MD  FOSRENOL 1000 MG chewable tablet Chew 1,000 mg by mouth 3 (three) times daily with meals.    [provider]  HYDROcodone-acetaminophen (NORCO/VICODIN) 5-325 MG tablet Take 1 tablet by mouth 4 (four) times daily as needed for pain. Patient taking differently: Take 1 tablet by mouth 4 (four) times daily as needed (Pain management). 08/27/23     HYDROcodone-acetaminophen (NORCO/VICODIN) 5-325 MG tablet Take 1 tablet by mouth 4 (four) times daily as needed for pain. 08/28/23     HYDROcodone-acetaminophen (NORCO/VICODIN) 5-325 MG  tablet Take 1 tablet by mouth 4 (four) times daily as needed for pain 10/27/23     HYDROcodone-acetaminophen (NORCO/VICODIN) 5-325 MG tablet Take 1 tablet by mouth 4 (four) times daily as needed for pain 11/25/23     iron sucrose (VENOFER) 20 MG/ML injection Inject 1,000 mg into the vein every 30 (thirty) days.    [provider]  levothyroxine (SYNTHROID) 200 MCG tablet Take 200 mcg by mouth at bedtime.    [provider]  losartan (COZAAR) 100 MG tablet Take 100 mg by mouth daily.    [provider]     Family History  Problem Relation Age of Onset   Multiple sclerosis Mother    Cancer Father    COPD Father     Social History   Socioeconomic History   Marital status: Divorced     Spouse name: Chrissie Noa   Number of children: Not on file   Years of education: Not on file   Highest education level: Associate degree: occupational, Scientist, product/process development, or vocational program  Occupational History   Not on file  Tobacco Use   Smoking status: Former    Current packs/day: 0.00    Types: Cigarettes    Quit date: 2023    Years since quitting: 2.1   Smokeless tobacco: Never  Vaping Use   Vaping status: Never Used  Substance and Sexual Activity   Alcohol use: Never   Drug use: Never   Sexual activity: Not Currently  Other Topics Concern   Not on file  Social History Narrative   Lives with Chrissie Noa, significant other. One pet, cat.    Social Drivers of Health   Financial Resource Strain: Medium Risk (11/26/2022)   Overall Financial Resource Strain (CARDIA)    Difficulty of Paying Living Expenses: Somewhat hard  Food Insecurity: Food Insecurity Present (11/26/2022)   Hunger Vital Sign    Worried About Running Out of Food in the Last Year: Sometimes true    Ran Out of Food in the Last Year: Never true  Transportation Needs: Unmet Transportation Needs (11/26/2022)   PRAPARE - Transportation    Lack of Transportation (Medical): No    Lack of Transportation (Non-Medical): Yes  Physical Activity: Insufficiently Active (11/26/2022)   Exercise Vital Sign    Days of Exercise per Week: 3 days    Minutes of Exercise per Session: 10 min  Stress: Stress Concern Present (11/26/2022)   [employer] of Occupational Health - Occupational Stress Questionnaire    Feeling of Stress : Very much  Social Connections: Socially Isolated (11/26/2022)   Social Connection and Isolation Panel [NHANES]    Frequency of Communication with Friends and Family: Three times a week    Frequency of Social Gatherings with Friends and Family: Once a week    Attends Religious Services: Never    Database administrator or Organizations: No    Attends Engineer, structural: Not on file    Marital Status:  Divorced    Review of Systems: A 12 point ROS discussed and pertinent positives are indicated in the HPI above.  All other systems are negative.  Review of Systems  Constitutional:  Negative for fatigue and fever.  HENT:  Negative for congestion.   Respiratory:  Negative for cough and shortness of breath.   Gastrointestinal:  Negative for abdominal pain, diarrhea, nausea and vomiting.  Musculoskeletal:  Positive for myalgias.    Vital Signs: BP 113/60   Pulse 80   Temp 98.4 F (36.9  C) (Oral)   Resp 16   Ht 5\' 7"  (1.702 m)   Wt 208 lb 5.4 oz (94.5 kg)   LMP 03/31/2022 (Exact Date) Comment: pt bleeding last 6 weeks with recent multiple transfusions  SpO2 100%   BMI 32.63 kg/m     Physical Exam Vitals and nursing note reviewed.  Constitutional:      Appearance: She is well-developed.  HENT:     Head: Normocephalic and atraumatic.  Eyes:     Conjunctiva/sclera: Conjunctivae normal.  Cardiovascular:     Rate and Rhythm: Normal rate and regular rhythm.  Pulmonary:     Effort: Pulmonary effort is normal.  Musculoskeletal:        General: Normal range of motion.     Cervical back: Normal range of motion.  Skin:    General: Skin is warm.  Neurological:     General: No focal deficit present.     Mental Status: She is alert and oriented to person, place, and time. Mental status is at baseline.  Psychiatric:        Mood and Affect: Mood normal.        Behavior: Behavior normal.        Thought Content: Thought content normal.        Judgment: Judgment normal.     Imaging: PERIPHERAL VASCULAR CATHETERIZATION Result Date: 10/23/2023   on 10/23/2023.   Okay to discharge home anytime after 1:50 PM as long as clinically stable   No indication for antiplatelet therapy at this time . Patient presents with poor flows in her right IJ tunneled hemodialysis catheter (19 cm cuff to tip- Palindrome) that was placed here 3 weeks ago. On examination, aspiration from the venous port is  sluggish with some difficulty flushing the arterial port. Chest x-ray confirms the catheter tip is appropriately positioned in the RA. The catheter cuff is 1/2cm deep to the exit site.   Summary: 1)The patient had a successful 23 cm CTT Palindrome hemodialysis catheter exchange in the right internal jugular vein. 2)No sheath noted through either port. 3)Okay to use catheter immediately.  Description of procedure: The right neck, chest and the catheter were prepped and draped in the usual sterile fashion. The exit site and adjacent tunnel tract were anesthetized with lidocaine 1% with epinephrine. The cuff was dissected free with a curved Kelly and manual traction. The catheter was withdrawn and venogram through each of the ports was performed; there was no fibrin sheath evident through either port.  A hydrophilic wire was manipulated and advanced through the arterial port and the tip was parked in the IVC. The catheter was completely removed and noted to be entirely intact. A new 23 cm cuff to tip Palindrome catheter was inserted over the guidewire and the tip parked in the inferior vena cava and at that point the cuff was approximately 1 cm deep to the exit site.  Aspiration and flushing of both limbs of the catheter confirmed excellent flow. No kinks were visible on fluoroscopic imaging. Both limbs of the catheter were locked with citrate and sterile caps were placed.  The hub was secured on to the chest wall with 2-0 nylon wing sutures.  Sterile dressings were placed, and the patient returned to recovery in stable condition.  Sedation: 1 mg Versed, 50 mcg Fentanyl Sedation time: 21 minutes  Contrast: 2 mL  Monitoring: Because of the patient's comorbid conditions and sedation during the procedure, continuous EKG monitoring and O2 saturation monitoring was performed throughout the  procedure by the RN. There were no abnormal arrhythmias encountered.  Complications: None.  Diagnoses:  T82.49XA Other complication of  vascular dialysis catheter (Poor flows) N18.6 End stage renal disease Z99.2 Dialysis dependence  Procedures Coding: 69629 Tunneled catheter exchange 77001  Fluoroscopy guidance for catheter exchange. B2841 Contrast  Recommendations: Remove the suture in 3 weeks. 2.   Report any blood flow problems to [facility].  Discharge: The patient was discharged home in stable condition. The patient was given education regarding the care of the catheter and specific instructions in case of any problems.   PERIPHERAL VASCULAR CATHETERIZATION Result Date: 09/28/2023   No indication for antiplatelet therapy at this time . Successful exchange of poorly functioning HD catheter with central venous stenosis of fibrin sheath treated successfully with 8mm balloon angioplasty.  Ok to use catheter immediately.  Remove hub suture in 3 weeks.    Labs:  CBC: Recent Labs    06/08/23 1425 07/13/23 0853 09/14/23 0915 10/23/23 2140  WBC 5.8 6.9 9.9 7.2  HGB 6.2* 8.4* 10.5* 8.5*  HCT 18.9* 25.1* 31.4* 25.9*  PLT 176 163 184 146*    COAGS: No results for input(s): "INR", "APTT" in the last 8760 hours.  BMP: Recent Labs    11/27/22 1339 10/23/23 2140  NA 134* 135  K 4.4 5.0  CL 94* 98  CO2 28 22  GLUCOSE 111* 137*  BUN 58* 52*  CALCIUM 9.5 8.8*  CREATININE 11.09* 11.09*  GFRNONAA  --  4*    LIVER FUNCTION TESTS: Recent Labs    11/27/22 1339 10/23/23 2140  BILITOT 0.3 0.5  AST 10 10*  ALT 12 13  ALKPHOS 107 90  PROT 6.9 6.8  ALBUMIN 4.3 3.7      Assessment and Plan: 38 y.o. female (in the ED). History of HLD, HTN, hypothyroidism, ESRD on home HD. Presented to the ED with a malpositioned tunneled HD catheter. Patient had the [facility] exchanged on 2.28.25 Interventional nephrology (formerly [facility]) on 2.28.25 Patient attempted home HD and found the catheter is pulled out and the cuff is now exposed. Per Patient the "hole is too big" and the catheter has had to be replaced 4 times in 2 months.  Team is requesting a new TDC [name].    PLAN: IR Image Guided Tunneled HD Catheter [name]  Risks and benefits discussed with the patient including, but not limited to bleeding, infection, vascular injury, pneumothorax which may require chest tube [name], air embolism or even death  All of the patient's questions were answered, patient is agreeable to proceed. Consent signed and in IR Control Room    Thank you for this interesting consult.  I greatly enjoyed meeting Holly [name] and look forward to participating in their care.  A copy of this report was sent to the requesting provider on this date.  Electronically Signed: Alene Mires, NP 10/24/2023, 8:52 AM   I spent a total of 20 Minutes    in face to face in clinical consultation, greater than 50% of which was counseling/coordinating care for Holly [name]

## 2023-10-24 NOTE — Procedures (Signed)
 Interventional Radiology Procedure Note  Procedure:  1) Tunneled hemodialysis catheter placement 2) Tunneled hemodialysis catheter removal  Findings: Please refer to procedural dictation for full description. New 23 cm tunneled HD catheter placed in right internal jugular vein.  Catheter tip in right atrium.  Indwelling, partially retracted catheter removed.  Complications: None immediate  Estimated Blood Loss:  < 5 mL  Recommendations: Catheter ready for immediate use.   Marliss Coots, MD

## 2023-10-24 NOTE — ED Notes (Signed)
 Pt updated on vascular as provider is currently in the OR with a critical pt, pt and husband are "agitated" over wait time, advise that PA-C Misty Stanley has spoken with vascular RN who is with vascular provider tonight to express pt's concern and reason for visit. Currently waiting on consult with vascular surgeon. Pt eating candy and drinking water, encourage pt to please withhold PO intake as unsure what plan of care for vascular at this time, pt verbalized understanding.

## 2023-10-27 ENCOUNTER — Other Ambulatory Visit: Payer: Self-pay

## 2023-10-27 ENCOUNTER — Other Ambulatory Visit (HOSPITAL_COMMUNITY): Payer: Self-pay

## 2023-11-09 ENCOUNTER — Other Ambulatory Visit: Payer: Medicare Other

## 2023-11-16 ENCOUNTER — Ambulatory Visit (INDEPENDENT_AMBULATORY_CARE_PROVIDER_SITE_OTHER): Payer: BLUE CROSS/BLUE SHIELD

## 2023-11-16 VITALS — Ht 67.0 in | Wt 216.0 lb

## 2023-11-16 DIAGNOSIS — Z0000 Encounter for general adult medical examination without abnormal findings: Secondary | ICD-10-CM | POA: Diagnosis not present

## 2023-11-16 NOTE — Patient Instructions (Addendum)
 Holly [name] , Thank you for taking time to come for your Medicare Wellness Visit. I appreciate your ongoing commitment to your health goals. Please review the following plan we discussed and let me know if I can assist you in the future.   Referrals/Orders/Follow-Ups/Clinician Recommendations: It was nice talking with you today.    This is a list of the screening recommended for you and due dates:  Health Maintenance  Topic Date Due   Medicare Annual Wellness Visit  Never done   Pneumococcal Vaccination (1 of 2 - PCV) 03/07/1994   DTaP/Tdap/Td vaccine (1 - Tdap) Never done   Flu Shot  03/26/2023   COVID-19 Vaccine (9 - 2024-25 season) 04/26/2023   Pap with HPV screening  05/27/2025   Hepatitis C Screening  Completed   HIV Screening  Completed   HPV Vaccine  Aged Out    Advanced directives: (Declined) Advance directive discussed with you today. Even though you declined this today, please call our office should you change your mind, and we can give you the proper paperwork for you to fill out.  Next Medicare Annual Wellness Visit scheduled for next year: Yes  Managing Pain Without Opioids Opioids are strong medicines used to treat moderate to severe pain. For some people, especially those who have long-term (chronic) pain, opioids may not be the best choice for pain management due to: Side effects like nausea, constipation, and sleepiness. The risk of addiction (opioid use disorder). The longer you take opioids, the greater your risk of addiction. Pain that lasts for more than 3 months is called chronic pain. Managing chronic pain usually requires more than one approach and is often provided by a team of health care providers working together (multidisciplinary approach). Pain management may be done at a pain management center or pain clinic. How to manage pain without the use of opioids Use non-opioid medicines Non-opioid medicines for pain may include: Over-the-counter or prescription  non-steroidal anti-inflammatory drugs (NSAIDs). These may be the first medicines used for pain. They work well for muscle and bone pain, and they reduce swelling. Acetaminophen. This over-the-counter medicine may work well for milder pain but not swelling. Antidepressants. These may be used to treat chronic pain. A certain type of antidepressant (tricyclics) is often used. These medicines are given in lower doses for pain than when used for depression. Anticonvulsants. These are usually used to treat seizures but may also reduce nerve (neuropathic) pain. Muscle relaxants. These relieve pain caused by sudden muscle tightening (spasms). You may also use a pain medicine that is applied to the skin as a patch, cream, or gel (topical analgesic), such as a numbing medicine. These may cause fewer side effects than medicines taken by mouth. Do certain therapies as directed Some therapies can help with pain management. They include: Physical therapy. You will do exercises to gain strength and flexibility. A physical therapist may teach you exercises to move and stretch parts of your body that are weak, stiff, or painful. You can learn these exercises at physical therapy visits and practice them at home. Physical therapy may also involve: Massage. Heat wraps or applying heat or cold to affected areas. Electrical signals that interrupt pain signals (transcutaneous electrical nerve stimulation, TENS). Weak lasers that reduce pain and swelling (low-level laser therapy). Signals from your body that help you learn to regulate pain (biofeedback). Occupational therapy. This helps you to learn ways to function at home and work with less pain. Recreational therapy. This involves trying new activities or  hobbies, such as a physical activity or drawing. Mental health therapy, including: Cognitive behavioral therapy (CBT). This helps you learn coping skills for dealing with pain. Acceptance and commitment therapy (ACT)  to change the way you think and react to pain. Relaxation therapies, including muscle relaxation exercises and mindfulness-based stress reduction. Pain management counseling. This may be individual, family, or group counseling.  Receive medical treatments Medical treatments for pain management include: Nerve block injections. These may include a pain blocker and anti-inflammatory medicines. You may have injections: Near the spine to relieve chronic back or neck pain. Into joints to relieve back or joint pain. Into nerve areas that supply a painful area to relieve body pain. Into muscles (trigger point injections) to relieve some painful muscle conditions. A medical device placed near your spine to help block pain signals and relieve nerve pain or chronic back pain (spinal cord stimulation device). Acupuncture. Follow these instructions at home Medicines Take over-the-counter and prescription medicines only as told by your health care provider. If you are taking pain medicine, ask your health care providers about possible side effects to watch out for. Do not drive or use heavy machinery while taking prescription opioid pain medicine. Lifestyle  Do not use drugs or alcohol to reduce pain. If you drink alcohol, limit how much you have to: 0-1 drink a day for women who are not pregnant. 0-2 drinks a day for men. Know how much alcohol is in a drink. In the [place] one drink equals one 12 oz bottle of beer (355 mL), one 5 oz glass of wine (148 mL), or one 1 oz glass of hard liquor (44 mL). Do not use any products that contain nicotine or tobacco. These products include cigarettes, chewing tobacco, and vaping devices, such as e-cigarettes. If you need help quitting, ask your health care provider. Eat a healthy diet and maintain a healthy weight. Poor diet and excess weight may make pain worse. Eat foods that are high in fiber. These include fresh fruits and vegetables, whole grains, and  beans. Limit foods that are high in fat and processed sugars, such as fried and sweet foods. Exercise regularly. Exercise lowers stress and may help relieve pain. Ask your health care provider what activities and exercises are safe for you. If your health care provider approves, join an exercise class that combines movement and stress reduction. Examples include yoga and tai chi. Get enough sleep. Lack of sleep may make pain worse. Lower stress as much as possible. Practice stress reduction techniques as told by your therapist. General instructions Work with all your pain management providers to find the treatments that work best for you. You are an important member of your pain management team. There are many things you can do to reduce pain on your own. Consider joining an online or in-person support group for people who have chronic pain. Keep all follow-up visits. This is important. Where to find more information You can find more information about managing pain without opioids from: American Academy of Pain Medicine: painmed.org Institute for Chronic Pain: instituteforchronicpain.org American Chronic Pain Association: theacpa.org Contact a health care provider if: You have side effects from pain medicine. Your pain gets worse or does not get better with treatments or home therapy. You are struggling with anxiety or depression. Summary Many types of pain can be managed without opioids. Chronic pain may respond better to pain management without opioids. Pain is best managed when you and a team of health care providers work together.  Pain management without opioids may include non-opioid medicines, medical treatments, physical therapy, mental health therapy, and lifestyle changes. Tell your health care providers if your pain gets worse or is not being managed well enough. This information is not intended to replace advice given to you by your health care provider. Make sure you discuss any  questions you have with your health care provider. Document Revised: 11/21/2020 Document Reviewed: 11/21/2020 Elsevier Patient Education  2024 [employer].

## 2023-11-16 NOTE — Progress Notes (Signed)
 Subjective:   [facility] Holly [name] is a 38 y.o. who presents for a Medicare Wellness preventive visit.  Visit Complete: Virtual I connected with  Holly [name] on 11/16/23 by a video and audio enabled telemedicine application and verified that I am speaking with the correct person using two identifiers.  Patient Location: Home  Provider Location: Home Office  I discussed the limitations of evaluation and management by telemedicine. The patient expressed understanding and agreed to proceed.  Vital Signs: Because this visit was a virtual/telehealth visit, some criteria may be missing or patient reported. Any vitals not documented were not able to be obtained and vitals that have been documented are patient reported.   Persons Participating in Visit: Patient.  AWV Questionnaire: No: Patient Medicare AWV questionnaire was not completed prior to this visit.  Cardiac Risk Factors include: advanced age (>33men, >102 women);hypertension     Objective:    Today's Vitals   11/16/23 0816  Weight: 216 lb (98 kg)  Height: 5\' 7"  (1.702 m)   Body mass index is 33.83 kg/m.     11/16/2023    8:25 AM 02/19/2023    8:52 AM 10/21/2022    8:43 PM 09/09/2022   12:12 PM 08/24/2022   10:46 PM 04/11/2022   11:38 AM 03/31/2022    3:37 PM  Advanced Directives  Does Patient Have a Medical Advance Directive? No No No No No No No  Would patient like information on creating a medical advance directive?  No - Patient declined No - Patient declined Yes (ED - Information included in AVS)       Current Medications (verified) Outpatient Encounter Medications as of 11/16/2023  Medication Sig   acyclovir (ZOVIRAX) 200 MG capsule Take 200 mg by mouth 2 (two) times daily as needed (out breaks).   albuterol (VENTOLIN HFA) 108 (90 Base) MCG/ACT inhaler Inhale 2-4 puffs into the lungs every 4 (four) hours as needed for wheezing (or cough).   atomoxetine (STRATTERA) 10 MG capsule Take 1  capsule (10 mg total) by mouth 2 (two) times daily with a meal.   carvedilol (COREG) 12.5 MG tablet Take 12.5 mg by mouth 2 (two) times daily with a meal.   cetirizine (ZYRTEC) 10 MG tablet Take 10 mg by mouth at bedtime.   cinacalcet (SENSIPAR) 60 MG tablet Take 60 mg by mouth at bedtime.   diclofenac Sodium (VOLTAREN) 1 % GEL Apply 1 application  topically 4 (four) times daily as needed (hand,feet,thighs and back pain).   epoetin alfa (EPOGEN) 20000 UNIT/ML injection Inject 20,000 Units into the skin every 14 (fourteen) days.   FOSRENOL 1000 MG chewable tablet Chew 1,000 mg by mouth 3 (three) times daily with meals.   HYDROcodone-acetaminophen (NORCO/VICODIN) 5-325 MG tablet Take 1 tablet by mouth 4 (four) times daily as needed for pain. (Patient taking differently: Take 1 tablet by mouth 4 (four) times daily as needed (Pain management).)   HYDROcodone-acetaminophen (NORCO/VICODIN) 5-325 MG tablet Take 1 tablet by mouth 4 (four) times daily as needed for pain.   HYDROcodone-acetaminophen (NORCO/VICODIN) 5-325 MG tablet Take 1 tablet by mouth 4 (four) times daily as needed for pain   [START ON 11/25/2023] HYDROcodone-acetaminophen (NORCO/VICODIN) 5-325 MG tablet Take 1 tablet by mouth 4 (four) times daily as needed for pain   iron sucrose (VENOFER) 20 MG/ML injection Inject 1,000 mg into the vein every 30 (thirty) days.   levothyroxine (SYNTHROID) 200 MCG tablet Take 200 mcg by mouth at bedtime.  escitalopram (LEXAPRO) 10 MG tablet Take 1 tablet (10 mg total) by mouth daily. (Patient not taking: Reported on 11/16/2023)   losartan (COZAAR) 100 MG tablet Take 100 mg by mouth daily. (Patient not taking: Reported on 11/16/2023)   No facility-administered encounter medications on file as of 11/16/2023.    Allergies (verified) Ciprofloxacin, Levetiracetam, Oxycodone-acetaminophen, Rituximab, and Tramadol   History: Past Medical History:  Diagnosis Date   Anemia    Anxiety    Arthritis    hands    Asthmatic bronchitis 02/11/2023   Blood transfusion without reported diagnosis    Cancer (HCC)    Complication of anesthesia    with Fentanyl she is difficult to awake   Depression    Dialysis patient (HCC)    Dyspnea    due to anemia   End stage renal disease (HCC)    Epilepsy (HCC)    Hashimoto's disease    History of kidney stones    HLD (hyperlipidemia)    Hypertension    Hypothyroidism    Membranoproliferative glomerulonephritis type 2, idiopathic    Pneumonia    Renal disorder    Thyroid disease    Past Surgical History:  Procedure Laterality Date   A/V FISTULAGRAM Left 09/09/2022   Procedure: A/V Fistulagram;  Surgeon: Renford Dills, MD;  Location: [facility] INVASIVE CV LAB;  Service: Cardiovascular;  Laterality: Left;   ABDOMINAL HYSTERECTOMY  06/24/2022   ABDOMINAL SURGERY     AV FISTULA PLACEMENT Left 01/17/2022   Procedure: ARTERIOVENOUS (AV) FISTULA CREATION ( RADIAL CEPHALIC);  Surgeon: Renford Dills, MD;  Location: [facility] ORS;  Service: Vascular;  Laterality: Left;   CERVICAL BIOPSY  W/ LOOP ELECTRODE EXCISION  2018   COLONOSCOPY N/A 01/30/2022   Procedure: COLONOSCOPY;  Surgeon: Jaynie Collins, DO;  Location: [facility] ENDOSCOPY;  Service: Gastroenterology;  Laterality: N/A;   COLONOSCOPY WITH PROPOFOL N/A 02/01/2022   Procedure: COLONOSCOPY WITH PROPOFOL;  Surgeon: Regis Bill, MD;  Location: [facility] ENDOSCOPY;  Service: Endoscopy;  Laterality: N/A;   DIAGNOSTIC LAPAROSCOPY     due to removal of PD catheters   DIALYSIS/PERMA CATHETER INSERTION N/A 06/09/2022   Procedure: DIALYSIS/PERMA CATHETER INSERTION;  Surgeon: Annice Needy, MD;  Location: [facility] INVASIVE CV LAB;  Service: Cardiovascular;  Laterality: N/A;   DIALYSIS/PERMA CATHETER INSERTION N/A 06/30/2022   Procedure: DIALYSIS/PERMA CATHETER INSERTION;  Surgeon: Cephus Shelling, MD;  Location: [facility] INVASIVE CV LAB;  Service: Cardiovascular;  Laterality: N/A;   DIALYSIS/PERMA CATHETER INSERTION  N/A 09/28/2023   Procedure: DIALYSIS/PERMA CATHETER INSERTION;  Surgeon: Tyler Pita, MD;  Location: [facility] INVASIVE CV LAB;  Service: Cardiovascular;  Laterality: N/A;   DIALYSIS/PERMA CATHETER INSERTION N/A 09/30/2023   Procedure: DIALYSIS/PERMA CATHETER INSERTION;  Surgeon: Ethelene Hal, MD;  Location[facility] INVASIVE CV LAB;  Service: Cardiovascular;  Laterality: N/A;   DIALYSIS/PERMA CATHETER INSERTION N/A 10/23/2023   Procedure: DIALYSIS/PERMA CATHETER INSERTION;  Surgeon: Dagoberto Ligas, MD;  Location: [facility] INVASIVE CV LAB;  Service: Cardiovascular;  Laterality: N/A;   DIALYSIS/PERMA CATHETER REMOVAL N/A 09/28/2023   Procedure: DIALYSIS/PERMA CATHETER REMOVAL;  Surgeon: Tyler Pita, MD;  Location: [facility] INVASIVE CV LAB;  Service: Cardiovascular;  Laterality: N/A;   DIALYSIS/PERMA CATHETER REMOVAL N/A 10/23/2023   Procedure: DIALYSIS/PERMA CATHETER REMOVAL;  Surgeon: Dagoberto Ligas, MD;  Location: [facility] INVASIVE CV LAB;  Service: Cardiovascular;  Laterality: N/A;   ESOPHAGOGASTRODUODENOSCOPY N/A 01/30/2022   Procedure: ESOPHAGOGASTRODUODENOSCOPY (EGD);  Surgeon: Jaynie Collins, DO;  Location: [facility] ENDOSCOPY;  Service:  Gastroenterology;  Laterality: N/A;   ESOPHAGOGASTRODUODENOSCOPY (EGD) WITH PROPOFOL N/A 02/01/2022   Procedure: ESOPHAGOGASTRODUODENOSCOPY (EGD) WITH PROPOFOL;  Surgeon: Regis Bill, MD;  Location: [facility] ENDOSCOPY;  Service: Endoscopy;  Laterality: N/A;   IR FLUORO GUIDE CV LINE RIGHT  10/24/2023   IR REMOVAL TUN CV CATH W/O FL  10/24/2023   IR US GUIDE VASC ACCESS RIGHT  10/24/2023   PERIPHERAL VASCULAR BALLOON ANGIOPLASTY Right 09/28/2023   Procedure: PERIPHERAL VASCULAR BALLOON ANGIOPLASTY;  Surgeon: Tyler Pita, MD;  Location: [facility] INVASIVE CV LAB;  Service: Cardiovascular;  Laterality: Right;  innominate sheath   RENAL BIOPSY     x 2   TUMOR REMOVAL     right face   UTERINE ARTERY EMBOLIZATION  05/04/2022   Family History  Problem Relation Age of Onset    Multiple sclerosis Mother    Cancer Father    COPD Father    Social History   Socioeconomic History   Marital status: Divorced    Spouse name: Chrissie Noa   Number of children: Not on file   Years of education: Not on file   Highest education level: Associate degree: occupational, Scientist, product/process development, or vocational program  Occupational History   Occupation: [employer]  Tobacco Use   Smoking status: Former    Current packs/day: 0.00    Types: Cigarettes    Quit date: 2023    Years since quitting: 2.2   Smokeless tobacco: Never  Vaping Use   Vaping status: Never Used  Substance and Sexual Activity   Alcohol use: Never   Drug use: Never   Sexual activity: Not Currently  Other Topics Concern   Not on file  Social History Narrative   Lives with Chrissie Noa, significant other. One pet, cat.    Social Drivers of Health   Financial Resource Strain: Medium Risk (11/16/2023)   Overall Financial Resource Strain (CARDIA)    Difficulty of Paying Living Expenses: Somewhat hard  Food Insecurity: No Food Insecurity (11/16/2023)   Hunger Vital Sign    Worried About Running Out of Food in the Last Year: Never true    Ran Out of Food in the Last Year: Never true  Transportation Needs: No Transportation Needs (11/16/2023)   PRAPARE - Administrator, Civil Service (Medical): No    Lack of Transportation (Non-Medical): No  Physical Activity: Insufficiently Active (11/16/2023)   Exercise Vital Sign    Days of Exercise per Week: 3 days    Minutes of Exercise per Session: 30 min  Stress: No Stress Concern Present (11/16/2023)   [employer] of Occupational Health - Occupational Stress Questionnaire    Feeling of Stress : Not at all  Social Connections: Socially Isolated (11/16/2023)   Social Connection and Isolation Panel [NHANES]    Frequency of Communication with Friends and Family: Twice a week    Frequency of Social Gatherings with Friends and Family: Never    Attends Religious Services:  Never    Database administrator or Organizations: No    Attends Engineer, structural: Never    Marital Status: Divorced    Tobacco Counseling Counseling given: Not Answered    Clinical Intake:  Pre-visit preparation completed: Yes  Pain : No/denies pain     BMI - recorded: 33.83 Nutritional Status: BMI > 30  Obese Nutritional Risks: None Diabetes: No  No results found for: "HGBA1C"   How often do you need to have someone help you when you read instructions, pamphlets, or other  written materials from your doctor or pharmacy?: 1 - Never  Interpreter Needed?: No  Information entered by :: Charly Hunton, CMA   Activities of Daily Living     11/16/2023    8:18 AM 10/23/2023   11:33 AM  In your present state of health, do you have any difficulty performing the following activities:  Hearing? 0 0  Vision? 0 0  Difficulty concentrating or making decisions? 0 0  Walking or climbing stairs? 0   Dressing or bathing? 0   Doing errands, shopping? 0   Preparing Food and eating ? N   Using the Toilet? N   In the past six months, have you accidently leaked urine? N   Do you have problems with loss of bowel control? N   Managing your Medications? N   Managing your Finances? N   Housekeeping or managing your Housekeeping? N     Patient Care Team: Holly Shackleton, NP-C as PCP - General (Family Medicine)  Indicate any recent Medical Services you may have received from other than [facility] providers in the past year (date may be approximate).     Assessment:   This is a routine wellness examination for [facility].  Hearing/Vision screen Hearing Screening - Comments:: Denies hearing difficulties   Vision Screening - Comments:: Wears eyglasses   Goals Addressed             This Visit's Progress    Patient Stated       Be realistic for transplant.       Depression Screen     11/16/2023    8:28 AM 11/27/2022    1:07 PM 08/21/2022   10:51 AM  PHQ 2/9 Scores   PHQ - 2 Score 3 6 5   PHQ- 9 Score 3 24 20     Fall Risk     11/16/2023    8:26 AM 11/27/2022    1:07 PM 08/21/2022   10:51 AM  Fall Risk   Falls in the past year? 0 1 1  Number falls in past yr: 0 1 1  Injury with Fall? 0 1 1  Risk for fall due to : No Fall Risks History of fall(s) History of fall(s)  Follow up Falls prevention discussed;Falls evaluation completed Falls evaluation completed Falls evaluation completed    MEDICARE RISK AT HOME:  Medicare Risk at Home Any stairs in or around the home?: No If so, are there any without handrails?: No Home free of loose throw rugs in walkways, pet beds, electrical cords, etc?: Yes Adequate lighting in your home to reduce risk of falls?: Yes Life alert?: No Use of a cane, walker or w/c?: No Grab bars in the bathroom?: Yes Shower chair or bench in shower?: Yes Elevated toilet seat or a handicapped toilet?: Yes  TIMED UP AND GO:  Was the test performed?  No  Cognitive Function: Normal: Normal cognitive status assessed by direct observation by this Clinical Health Advisor. No abnormalities found. Patient is able to answer questions in an accurate and timely manner.        Immunizations Immunization History  Administered Date(s) Administered   Hep B, Unspecified 11/13/2014, 11/16/2014, 12/26/2014, 01/24/2015, 02/13/2015, 05/30/2015, 05/14/2016   Hepatitis B 12/26/2014   Hepatitis B, ADULT 11/13/2014, 11/16/2014, 12/26/2014, 01/24/2015, 02/13/2015, 05/30/2015, 05/14/2016   Influenza-Unspecified 05/08/2015, 05/08/2015, 05/15/2016, 05/15/2016, 05/07/2017, 05/07/2017, 05/25/2017, 05/25/2017, 06/11/2017, 06/11/2017, 07/05/2018, 07/05/2018, 06/01/2019, 06/01/2019, 06/21/2020, 06/21/2020, 06/26/2021, 06/26/2021   Moderna Sars-Covid-2 Vaccination 11/01/2019, 11/01/2019, 11/29/2019, 11/29/2019   PPD Test 06/15/2015, 06/15/2015,  09/18/2015, 09/18/2015, 05/07/2017, 05/07/2017, 01/19/2018, 01/19/2018, 12/27/2018, 12/27/2018, 05/28/2019,  05/28/2019, 06/01/2019, 06/01/2019, 05/29/2020, 05/29/2020   Pfizer Covid-19 Vaccine Bivalent Booster 52yrs & up 11/01/2019, 11/29/2019   Pneumococcal-Unspecified 12/07/2014, 12/07/2014, 06/15/2015, 06/15/2015, 07/27/2017, 07/27/2017   Unspecified SARS-COV-2 Vaccination 11/01/2019, 11/29/2019    Screening Tests Health Maintenance  Topic Date Due   Medicare Annual Wellness (AWV)  Never done   Pneumococcal Vaccine 59-85 Years old (1 of 2 - PCV) 03/07/1994   DTaP/Tdap/Td (1 - Tdap) Never done   INFLUENZA VACCINE  03/26/2023   COVID-19 Vaccine (9 - 2024-25 season) 04/26/2023   Cervical Cancer Screening (HPV/Pap Cotest)  05/27/2025   Hepatitis C Screening  Completed   HIV Screening  Completed   HPV VACCINES  Aged Out    Health Maintenance  Health Maintenance Due  Topic Date Due   Medicare Annual Wellness (AWV)  Never done   Pneumococcal Vaccine 65-58 Years old (1 of 2 - PCV) 03/07/1994   DTaP/Tdap/Td (1 - Tdap) Never done   INFLUENZA VACCINE  03/26/2023   COVID-19 Vaccine (9 - 2024-25 season) 04/26/2023   Health Maintenance Items Addressed: See Nurse Notes  Additional Screening:  Vision Screening: Recommended annual ophthalmology exams for early detection of glaucoma and other disorders of the eye.  Dental Screening: Recommended annual dental exams for proper oral hygiene  Community Resource Referral / Chronic Care Management: CRR required this visit?  No   CCM required this visit?  No     Plan:     I have personally reviewed and noted the following in the patient's chart:   Medical and social history Use of alcohol, tobacco or illicit drugs  Current medications and supplements including opioid prescriptions. Patient is currently taking opioid prescriptions. Information provided to patient regarding non-opioid alternatives. Patient advised to discuss non-opioid treatment plan with their provider. Functional ability and status Nutritional status Physical  activity Advanced directives List of other physicians Hospitalizations, surgeries, and ER visits in previous 12 months Vitals Screenings to include cognitive, depression, and falls Referrals and appointments  In addition, I have reviewed and discussed with patient certain preventive protocols, quality metrics, and best practice recommendations. A written personalized care plan for preventive services as well as general preventive health recommendations were provided to patient.     Dasani Crear L Jonty Morrical, CMA   11/16/2023   After Visit Summary: (MyChart) Due to this being a telephonic visit, the after visit summary with patients personalized plan was offered to patient via MyChart   Notes: Nothing significant to report at this time.

## 2023-11-25 ENCOUNTER — Encounter (HOSPITAL_COMMUNITY): Payer: Self-pay

## 2023-11-25 ENCOUNTER — Other Ambulatory Visit (HOSPITAL_COMMUNITY): Payer: Self-pay

## 2023-11-26 ENCOUNTER — Other Ambulatory Visit (HOSPITAL_COMMUNITY): Payer: Self-pay

## 2023-12-07 ENCOUNTER — Encounter (HOSPITAL_BASED_OUTPATIENT_CLINIC_OR_DEPARTMENT_OTHER): Payer: Self-pay | Admitting: Emergency Medicine

## 2023-12-07 ENCOUNTER — Encounter: Payer: Self-pay | Admitting: Family Medicine

## 2023-12-07 ENCOUNTER — Ambulatory Visit: Payer: Medicare Other | Admitting: Internal Medicine

## 2023-12-07 ENCOUNTER — Other Ambulatory Visit: Payer: Medicare Other

## 2023-12-07 ENCOUNTER — Observation Stay (HOSPITAL_BASED_OUTPATIENT_CLINIC_OR_DEPARTMENT_OTHER)
Admission: EM | Admit: 2023-12-07 | Discharge: 2023-12-08 | Disposition: A | Discharge status: Home / Self Care | Attending: Internal Medicine | Admitting: Internal Medicine

## 2023-12-07 ENCOUNTER — Encounter: Payer: Self-pay | Admitting: Internal Medicine

## 2023-12-07 ENCOUNTER — Other Ambulatory Visit: Payer: Self-pay

## 2023-12-07 ENCOUNTER — Emergency Department (HOSPITAL_BASED_OUTPATIENT_CLINIC_OR_DEPARTMENT_OTHER): Admitting: Radiology

## 2023-12-07 DIAGNOSIS — Z992 Dependence on renal dialysis: Secondary | ICD-10-CM | POA: Insufficient documentation

## 2023-12-07 DIAGNOSIS — D631 Anemia in chronic kidney disease: Secondary | ICD-10-CM | POA: Diagnosis not present

## 2023-12-07 DIAGNOSIS — D649 Anemia, unspecified: Principal | ICD-10-CM | POA: Diagnosis present

## 2023-12-07 DIAGNOSIS — Z859 Personal history of malignant neoplasm, unspecified: Secondary | ICD-10-CM | POA: Diagnosis not present

## 2023-12-07 DIAGNOSIS — G894 Chronic pain syndrome: Secondary | ICD-10-CM | POA: Insufficient documentation

## 2023-12-07 DIAGNOSIS — Z87891 Personal history of nicotine dependence: Secondary | ICD-10-CM | POA: Diagnosis not present

## 2023-12-07 DIAGNOSIS — I120 Hypertensive chronic kidney disease with stage 5 chronic kidney disease or end stage renal disease: Secondary | ICD-10-CM | POA: Insufficient documentation

## 2023-12-07 DIAGNOSIS — N186 End stage renal disease: Principal | ICD-10-CM

## 2023-12-07 DIAGNOSIS — Z90710 Acquired absence of both cervix and uterus: Secondary | ICD-10-CM | POA: Insufficient documentation

## 2023-12-07 DIAGNOSIS — R0602 Shortness of breath: Secondary | ICD-10-CM | POA: Diagnosis present

## 2023-12-07 DIAGNOSIS — I10 Essential (primary) hypertension: Secondary | ICD-10-CM | POA: Diagnosis present

## 2023-12-07 DIAGNOSIS — Z9889 Other specified postprocedural states: Secondary | ICD-10-CM | POA: Insufficient documentation

## 2023-12-07 DIAGNOSIS — E785 Hyperlipidemia, unspecified: Secondary | ICD-10-CM | POA: Diagnosis not present

## 2023-12-07 DIAGNOSIS — F32A Depression, unspecified: Secondary | ICD-10-CM | POA: Diagnosis not present

## 2023-12-07 DIAGNOSIS — F411 Generalized anxiety disorder: Secondary | ICD-10-CM | POA: Insufficient documentation

## 2023-12-07 DIAGNOSIS — Z7902 Long term (current) use of antithrombotics/antiplatelets: Secondary | ICD-10-CM | POA: Insufficient documentation

## 2023-12-07 DIAGNOSIS — E039 Hypothyroidism, unspecified: Secondary | ICD-10-CM | POA: Diagnosis not present

## 2023-12-07 HISTORY — DX: End stage renal disease: N18.6

## 2023-12-07 HISTORY — DX: End stage renal disease: Z99.2

## 2023-12-07 LAB — BASIC METABOLIC PANEL WITH GFR
Anion gap: 16 — ABNORMAL HIGH (ref 5–15)
BUN: 60 mg/dL — ABNORMAL HIGH (ref 6–20)
CO2: 23 mmol/L (ref 22–32)
Calcium: 9.5 mg/dL (ref 8.9–10.3)
Chloride: 96 mmol/L — ABNORMAL LOW (ref 98–111)
Creatinine, Ser: 11.53 mg/dL — ABNORMAL HIGH (ref 0.44–1.00)
GFR, Estimated: 4 mL/min — ABNORMAL LOW (ref >60)
Glucose, Bld: 175 mg/dL — ABNORMAL HIGH (ref 70–99)
Potassium: 5 mmol/L (ref 3.5–5.1)
Sodium: 135 mmol/L (ref 135–145)

## 2023-12-07 LAB — CBC WITH DIFFERENTIAL/PLATELET
Abs Immature Granulocytes: 0.01 10*3/uL (ref 0.00–0.07)
Basophils Absolute: 0 10*3/uL (ref 0.0–0.1)
Basophils Relative: 0 %
Eosinophils Absolute: 0.3 10*3/uL (ref 0.0–0.5)
Eosinophils Relative: 5 %
HCT: 19.5 % — ABNORMAL LOW (ref 36.0–46.0)
Hemoglobin: 6.5 g/dL — CL (ref 12.0–15.0)
Immature Granulocytes: 0 %
Lymphocytes Relative: 17 %
Lymphs Abs: 0.8 10*3/uL (ref 0.7–4.0)
MCH: 28.3 pg (ref 26.0–34.0)
MCHC: 33.3 g/dL (ref 30.0–36.0)
MCV: 84.8 fL (ref 80.0–100.0)
Monocytes Absolute: 0.4 10*3/uL (ref 0.1–1.0)
Monocytes Relative: 8 %
Neutro Abs: 3.4 10*3/uL (ref 1.7–7.7)
Neutrophils Relative %: 70 %
Platelets: 184 10*3/uL (ref 150–400)
RBC: 2.3 MIL/uL — ABNORMAL LOW (ref 3.87–5.11)
RDW: 14.2 % (ref 11.5–15.5)
WBC: 4.9 10*3/uL (ref 4.0–10.5)
nRBC: 0 % (ref 0.0–0.2)

## 2023-12-07 LAB — HCG, SERUM, QUALITATIVE: Preg, Serum: NEGATIVE

## 2023-12-07 MED ORDER — ONDANSETRON 4 MG PO TBDP: 4.0000 mg | ORAL_TABLET | Freq: Once | ORAL | Status: DC

## 2023-12-07 MED ORDER — HYDROCODONE-ACETAMINOPHEN 5-325 MG PO TABS
1.0000 | ORAL_TABLET | Freq: Four times a day (QID) | ORAL | Status: DC | PRN
  Administered 2023-12-07 – 2023-12-08 (×2): 1 via ORAL
  Filled 2023-12-07 (×2): qty 1

## 2023-12-07 MED ORDER — HYDROCODONE-ACETAMINOPHEN 5-325 MG PO TABS: 1.0000 | ORAL_TABLET | Freq: Once | ORAL | Status: DC
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
## 2023-12-07 NOTE — ED Notes (Signed)
 Attempted report at this time. Will call back

## 2023-12-07 NOTE — ED Triage Notes (Signed)
 Started last night Sob , fatigue and "air hunger"  Reports last labs hgb 7.5  Diminished lung sound

## 2023-12-08 ENCOUNTER — Encounter (HOSPITAL_COMMUNITY): Payer: Self-pay | Admitting: Internal Medicine

## 2023-12-08 DIAGNOSIS — Z992 Dependence on renal dialysis: Secondary | ICD-10-CM | POA: Diagnosis not present

## 2023-12-08 DIAGNOSIS — F411 Generalized anxiety disorder: Secondary | ICD-10-CM | POA: Insufficient documentation

## 2023-12-08 DIAGNOSIS — I10 Essential (primary) hypertension: Secondary | ICD-10-CM

## 2023-12-08 DIAGNOSIS — E7849 Other hyperlipidemia: Secondary | ICD-10-CM

## 2023-12-08 DIAGNOSIS — I120 Hypertensive chronic kidney disease with stage 5 chronic kidney disease or end stage renal disease: Secondary | ICD-10-CM | POA: Diagnosis not present

## 2023-12-08 DIAGNOSIS — D649 Anemia, unspecified: Secondary | ICD-10-CM

## 2023-12-08 DIAGNOSIS — E785 Hyperlipidemia, unspecified: Secondary | ICD-10-CM | POA: Insufficient documentation

## 2023-12-08 DIAGNOSIS — D631 Anemia in chronic kidney disease: Secondary | ICD-10-CM | POA: Diagnosis not present

## 2023-12-08 DIAGNOSIS — Z90710 Acquired absence of both cervix and uterus: Secondary | ICD-10-CM | POA: Insufficient documentation

## 2023-12-08 DIAGNOSIS — Z859 Personal history of malignant neoplasm, unspecified: Secondary | ICD-10-CM | POA: Diagnosis not present

## 2023-12-08 DIAGNOSIS — Z9889 Other specified postprocedural states: Secondary | ICD-10-CM | POA: Diagnosis not present

## 2023-12-08 DIAGNOSIS — F32A Depression, unspecified: Secondary | ICD-10-CM | POA: Diagnosis not present

## 2023-12-08 DIAGNOSIS — N186 End stage renal disease: Secondary | ICD-10-CM | POA: Diagnosis not present

## 2023-12-08 DIAGNOSIS — G894 Chronic pain syndrome: Secondary | ICD-10-CM | POA: Diagnosis not present

## 2023-12-08 DIAGNOSIS — R0602 Shortness of breath: Secondary | ICD-10-CM | POA: Diagnosis present

## 2023-12-08 DIAGNOSIS — Z87891 Personal history of nicotine dependence: Secondary | ICD-10-CM | POA: Diagnosis not present

## 2023-12-08 DIAGNOSIS — E039 Hypothyroidism, unspecified: Secondary | ICD-10-CM | POA: Diagnosis not present

## 2023-12-08 DIAGNOSIS — Z7902 Long term (current) use of antithrombotics/antiplatelets: Secondary | ICD-10-CM | POA: Diagnosis not present

## 2023-12-08 LAB — COMPREHENSIVE METABOLIC PANEL WITH GFR
ALT: 9 U/L (ref 0–44)
AST: 9 U/L — ABNORMAL LOW (ref 15–41)
Albumin: 3.4 g/dL — ABNORMAL LOW (ref 3.5–5.0)
Alkaline Phosphatase: 92 U/L (ref 38–126)
Anion gap: 15 (ref 5–15)
BUN: 61 mg/dL — ABNORMAL HIGH (ref 6–20)
CO2: 20 mmol/L — ABNORMAL LOW (ref 22–32)
Calcium: 9 mg/dL (ref 8.9–10.3)
Chloride: 100 mmol/L (ref 98–111)
Creatinine, Ser: 12.62 mg/dL — ABNORMAL HIGH (ref 0.44–1.00)
GFR, Estimated: 4 mL/min — ABNORMAL LOW (ref >60)
Glucose, Bld: 91 mg/dL (ref 70–99)
Potassium: 4.8 mmol/L (ref 3.5–5.1)
Sodium: 135 mmol/L (ref 135–145)
Total Bilirubin: 0.5 mg/dL (ref 0.0–1.2)
Total Protein: 6.1 g/dL — ABNORMAL LOW (ref 6.5–8.1)

## 2023-12-08 LAB — CBC
HCT: 18.1 % — ABNORMAL LOW (ref 36.0–46.0)
HCT: 20.9 % — ABNORMAL LOW (ref 36.0–46.0)
Hemoglobin: 6 g/dL — CL (ref 12.0–15.0)
Hemoglobin: 7 g/dL — ABNORMAL LOW (ref 12.0–15.0)
MCH: 28.3 pg (ref 26.0–34.0)
MCH: 28.3 pg (ref 26.0–34.0)
MCHC: 33.1 g/dL (ref 30.0–36.0)
MCHC: 33.5 g/dL (ref 30.0–36.0)
MCV: 85.4 fL (ref 80.0–100.0)
MCV: 84.6 fL (ref 80.0–100.0)
Platelets: 178 10*3/uL (ref 150–400)
Platelets: 168 10*3/uL (ref 150–400)
RBC: 2.12 MIL/uL — ABNORMAL LOW (ref 3.87–5.11)
RBC: 2.47 MIL/uL — ABNORMAL LOW (ref 3.87–5.11)
RDW: 14 % (ref 11.5–15.5)
RDW: 13.8 % (ref 11.5–15.5)
WBC: 5.7 10*3/uL (ref 4.0–10.5)
WBC: 6.4 10*3/uL (ref 4.0–10.5)
nRBC: 0 % (ref 0.0–0.2)
nRBC: 0 % (ref 0.0–0.2)

## 2023-12-08 LAB — PREPARE RBC (CROSSMATCH)

## 2023-12-08 LAB — HIV ANTIBODY (ROUTINE TESTING W REFLEX): HIV Screen 4th Generation wRfx: NONREACTIVE

## 2023-12-08 MED ORDER — ACETAMINOPHEN 325 MG PO TABS: 650.0000 mg | ORAL_TABLET | Freq: Four times a day (QID) | ORAL | Status: DC | PRN

## 2023-12-08 MED ORDER — ALBUTEROL SULFATE (2.5 MG/3ML) 0.083% IN NEBU: 3.0000 mL | INHALATION_SOLUTION | RESPIRATORY_TRACT | Status: DC | PRN

## 2023-12-08 MED ORDER — SODIUM CHLORIDE 0.9% IV SOLUTION: Freq: Once | INTRAVENOUS | Status: AC

## 2023-12-08 MED ORDER — ORAL CARE MOUTH RINSE: 15.0000 mL | OROMUCOSAL | Status: DC | PRN

## 2023-12-08 MED ORDER — ONDANSETRON HCL 4 MG/2ML IJ SOLN: 4.0000 mg | Freq: Four times a day (QID) | INTRAMUSCULAR | Status: DC | PRN

## 2023-12-08 MED ORDER — CARVEDILOL 12.5 MG PO TABS
12.5000 mg | ORAL_TABLET | Freq: Two times a day (BID) | ORAL | Status: DC
  Administered 2023-12-08 (×2): 12.5 mg via ORAL
  Filled 2023-12-08 (×2): qty 1

## 2023-12-08 MED ORDER — CINACALCET HCL 30 MG PO TABS: 60.0000 mg | ORAL_TABLET | Freq: Every day | ORAL | Status: DC

## 2023-12-08 MED ORDER — ONDANSETRON HCL 4 MG PO TABS
4.0000 mg | ORAL_TABLET | Freq: Four times a day (QID) | ORAL | Status: DC | PRN
Start: 2023-12-08 — End: 2023-12-09

## 2023-12-08 MED ORDER — FERRIC CITRATE 1 GM 210 MG(FE) PO TABS
420.0000 mg | ORAL_TABLET | Freq: Three times a day (TID) | ORAL | Status: DC
  Administered 2023-12-08: 420 mg via ORAL
  Filled 2023-12-08 (×2): qty 2

## 2023-12-08 MED ORDER — SODIUM CHLORIDE 0.9% FLUSH
3.0000 mL | Freq: Two times a day (BID) | INTRAVENOUS | Status: DC
  Administered 2023-12-08: 3 mL via INTRAVENOUS

## 2023-12-08 MED ORDER — LEVOTHYROXINE SODIUM 100 MCG PO TABS
200.0000 ug | ORAL_TABLET | Freq: Every day | ORAL | Status: DC
  Administered 2023-12-08: 200 ug via ORAL
  Filled 2023-12-08: qty 2

## 2023-12-08 MED ORDER — SODIUM CHLORIDE 0.9% FLUSH: 3.0000 mL | INTRAVENOUS | Status: DC | PRN

## 2023-12-08 MED ORDER — SODIUM CHLORIDE 0.9 % IV SOLN: 250.0000 mL | INTRAVENOUS | Status: DC | PRN

## 2023-12-08 NOTE — H&P (Addendum)
 History and Physical    Holly [name] ZOX:[id number] DOB: [birth year] DOA: 12/07/2023  PCP: Avanell Shackleton, NP-C   Patient coming from: Home   Chief Complaint:  Chief Complaint  Patient presents with   Shortness of Breath   ED TRIAGE note:Started last night Sob , fatigue and "air hunger"  Reports last labs hgb 7.5  Diminished lung sound  HPI:  Holly [name] is a 38 y.o. 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  Patient reported she is on home dialysis.  Reported previous history of anemia and blood loss due to home dialysis at home in the past.  Patient reported she has been on erythropoietin and iron infusion at home as well. She reports that she lost blood recently about a week ago due to power outage during her hemodialysis session and because she is getting ready to move she did not have her backup battery on. She lost blood again due a faulty cartridge that she was sent. Her hemoglobin is generally about 7-1/2 or 8. She has been feeling a little bit more lightheaded and weak with some exertional dyspnea over the past 2 days. She reports that she was told by her nephrologist to come in and get checked for low low blood levels especially since she tends to lose some blood during her hemodialysis sessions.  Patient denies any chest pain, palpitation, hemoptysis, hematemesis and melena.   At presentation to ED patient is hemodynamically stable. Pregnancy test negative. BMP showed evidence of ESRD elevated BUN 60, creatinine 11.53, elevated anion gap 16. CBC showing low hemoglobin 6.5 (baseline hemoglobin around 8-10), low" 19, normal platelet and WBC  count.  Chest x-ray no active disease process.  Hospitalist has been consulted and patient has been transferred to [facility] for the management of acute on chronic symptomatic anemia.   Significant labs in the ED: Lab Orders         CBC with Differential         Basic metabolic panel         hCG, serum, qualitative         HIV Antibody (routine testing w rflx)         Comprehensive metabolic panel         CBC       Review of Systems:  Review of Systems  Constitutional:  Negative for chills, diaphoresis, fever, malaise/fatigue and weight loss.  Respiratory:  Positive for shortness of breath. Negative for cough, sputum production and wheezing.   Cardiovascular:  Negative for chest pain, palpitations and leg swelling.  Gastrointestinal:  Negative for abdominal pain, diarrhea, heartburn, nausea and vomiting.  Neurological:  Negative for dizziness.  Endo/Heme/Allergies:  Negative for environmental allergies. Does not bruise/bleed easily.  Psychiatric/Behavioral:  The patient is not nervous/anxious.     Past Medical History:  Diagnosis Date   Anemia    Anxiety    Arthritis    hands   Asthmatic bronchitis 02/11/2023   Blood transfusion without reported diagnosis    Cancer (HCC)    Complication of anesthesia    with Fentanyl she is difficult to awake   Depression    Dialysis patient (HCC)    Dyspnea    due to anemia   End stage renal disease (  HCC)    Epilepsy (HCC)    Hashimoto's disease    History of kidney stones    HLD (hyperlipidemia)    Hypertension    Hypothyroidism    Membranoproliferative glomerulonephritis type 2, idiopathic    Pneumonia    Renal disorder    Thyroid disease     Past Surgical History:  Procedure Laterality Date   A/V FISTULAGRAM Left 09/09/2022   Procedure: A/V Fistulagram;  Surgeon: Renford Dills, MD;  Location: [facility] INVASIVE CV LAB;  Service: Cardiovascular;  Laterality: Left;   ABDOMINAL HYSTERECTOMY  06/24/2022    ABDOMINAL SURGERY     AV FISTULA PLACEMENT Left 01/17/2022   Procedure: ARTERIOVENOUS (AV) FISTULA CREATION ( RADIAL CEPHALIC);  Surgeon: Renford Dills, MD;  Location: [facility] ORS;  Service: Vascular;  Laterality: Left;   CERVICAL BIOPSY  W/ LOOP ELECTRODE EXCISION  2018   COLONOSCOPY N/A 01/30/2022   Procedure: COLONOSCOPY;  Surgeon: Jaynie Collins, DO;  Location: [facility] ENDOSCOPY;  Service: Gastroenterology;  Laterality: N/A;   COLONOSCOPY WITH PROPOFOL N/A 02/01/2022   Procedure: COLONOSCOPY WITH PROPOFOL;  Surgeon: Regis Bill, MD;  Location: [facility] ENDOSCOPY;  Service: Endoscopy;  Laterality: N/A;   DIAGNOSTIC LAPAROSCOPY     due to removal of PD catheters   DIALYSIS/PERMA CATHETER INSERTION N/A 06/09/2022   Procedure: DIALYSIS/PERMA CATHETER INSERTION;  Surgeon: Annice Needy, MD;  Location: [facility] INVASIVE CV LAB;  Service: Cardiovascular;  Laterality: N/A;   DIALYSIS/PERMA CATHETER INSERTION N/A 06/30/2022   Procedure: DIALYSIS/PERMA CATHETER INSERTION;  Surgeon: Cephus Shelling, MD;  Location: [facility] INVASIVE CV LAB;  Service: Cardiovascular;  Laterality: N/A;   DIALYSIS/PERMA CATHETER INSERTION N/A 09/28/2023   Procedure: DIALYSIS/PERMA CATHETER INSERTION;  Surgeon: Tyler Pita, MD;  Location: [facility] INVASIVE CV LAB;  Service: Cardiovascular;  Laterality: N/A;   DIALYSIS/PERMA CATHETER INSERTION N/A 09/30/2023   Procedure: DIALYSIS/PERMA CATHETER INSERTION;  Surgeon: Ethelene Hal, MD;  Location: [facility] INVASIVE CV LAB;  Service: Cardiovascular;  Laterality: N/A;   DIALYSIS/PERMA CATHETER INSERTION N/A 10/23/2023   Procedure: DIALYSIS/PERMA CATHETER INSERTION;  Surgeon: Dagoberto Ligas, MD;  Location: [facility] INVASIVE CV LAB;  Service: Cardiovascular;  Laterality: N/A;   DIALYSIS/PERMA CATHETER REMOVAL N/A 09/28/2023   Procedure: DIALYSIS/PERMA CATHETER REMOVAL;  Surgeon: Tyler Pita, MD;  Location: [facility] INVASIVE CV LAB;  Service: Cardiovascular;  Laterality: N/A;   DIALYSIS/PERMA  CATHETER REMOVAL N/A 10/23/2023   Procedure: DIALYSIS/PERMA CATHETER REMOVAL;  Surgeon: Dagoberto Ligas, MD;  Location: [facility] INVASIVE CV LAB;  Service: Cardiovascular;  Laterality: N/A;   ESOPHAGOGASTRODUODENOSCOPY N/A 01/30/2022   Procedure: ESOPHAGOGASTRODUODENOSCOPY (EGD);  Surgeon: Jaynie Collins, DO;  Location: [facility] ENDOSCOPY;  Service: Gastroenterology;  Laterality: N/A;   ESOPHAGOGASTRODUODENOSCOPY (EGD) WITH PROPOFOL N/A 02/01/2022   Procedure: ESOPHAGOGASTRODUODENOSCOPY (EGD) WITH PROPOFOL;  Surgeon: Regis Bill, MD;  Location: [facility] ENDOSCOPY;  Service: Endoscopy;  Laterality: N/A;   IR FLUORO GUIDE CV LINE RIGHT  10/24/2023   IR REMOVAL TUN CV CATH W/O FL  10/24/2023   IR US GUIDE VASC ACCESS RIGHT  10/24/2023   PERIPHERAL VASCULAR BALLOON ANGIOPLASTY Right 09/28/2023   Procedure: PERIPHERAL VASCULAR BALLOON ANGIOPLASTY;  Surgeon: Tyler Pita, MD;  Location: [facility] INVASIVE CV LAB;  Service: Cardiovascular;  Laterality: Right;  innominate sheath   RENAL BIOPSY     x 2   TUMOR REMOVAL     right face   UTERINE ARTERY EMBOLIZATION  05/04/2022     reports that she quit smoking about 2 years ago.  Her smoking use included cigarettes. She has never used smokeless tobacco. She reports that she does not drink alcohol and does not use drugs.  Allergies  Allergen Reactions   Ciprofloxacin Anaphylaxis and Other (See Comments)    Caused Seizures   Seizures   Other reaction(s): Other (See Comments)  Seizures  Seizures     Caused Seizures  Seizures  Seizures  Other reaction(s): Other (See Comments) Seizures  Seizures  Seizures     Other reaction(s): Other (See Comments) Seizures  Seizures  Seizures     Caused Seizures  Caused Seizures  Seizures  Seizures  Other reaction(s): Other (See Comments) Seizures  Seizures  Seizures    Seizures Grand mal   Levetiracetam Anaphylaxis and Other (See Comments)    Medical Record H&P (unknown reaction)  Medical Record H&P  Other  reaction(s): Other (See Comments)   Oxycodone-Acetaminophen Nausea Only and Other (See Comments)   Rituximab Anaphylaxis and Swelling   Tramadol Anaphylaxis and Other (See Comments)    Caused seizures  Other reaction(s): Other (See Comments)  Medical Record H&P  Medical Record H&P    Caused seizures    Family History  Problem Relation Age of Onset   Multiple sclerosis Mother    Cancer Father    COPD Father     Prior to Admission medications   Medication Sig Start Date End Date Taking? Authorizing Provider  acyclovir (ZOVIRAX) 200 MG capsule Take 200 mg by mouth 2 (two) times daily as needed (out breaks). 08/14/21   [provider]  albuterol (VENTOLIN HFA) 108 (90 Base) MCG/ACT inhaler Inhale 2-4 puffs into the lungs every 4 (four) hours as needed for wheezing (or cough). 12/04/22   [name], [name], NP-C  atomoxetine (STRATTERA) 10 MG capsule Take 1 capsule (10 mg total) by mouth 2 (two) times daily with a meal. 09/15/23   Arfeen, Bronson Canny, MD  carvedilol (COREG) 12.5 MG tablet Take 12.5 mg by mouth 2 (two) times daily with a meal.    [provider]  cetirizine (ZYRTEC) 10 MG tablet Take 10 mg by mouth at bedtime.    [provider]  cinacalcet (SENSIPAR) 60 MG tablet Take 60 mg by mouth at bedtime.    [provider]  diclofenac Sodium (VOLTAREN) 1 % GEL Apply 1 application  topically 4 (four) times daily as needed (hand,feet,thighs and back pain).    [provider]  epoetin alfa (EPOGEN) 20000 UNIT/ML injection Inject 20,000 Units into the skin every 14 (fourteen) days.    [provider]  escitalopram (LEXAPRO) 10 MG tablet Take 1 tablet (10 mg total) by mouth daily. Patient not taking: Reported on 11/16/2023 02/09/23   Arfeen, Bronson Canny, MD  FOSRENOL 1000 MG chewable tablet Chew 1,000 mg by mouth 3 (three) times daily with meals.    [provider]  HYDROcodone-acetaminophen (NORCO/VICODIN) 5-325 MG tablet Take 1 tablet  by mouth 4 (four) times daily as needed for pain. Patient taking differently: Take 1 tablet by mouth 4 (four) times daily as needed (Pain management). 08/27/23     HYDROcodone-acetaminophen (NORCO/VICODIN) 5-325 MG tablet Take 1 tablet by mouth 4 (four) times daily as needed for pain. 08/28/23     HYDROcodone-acetaminophen (NORCO/VICODIN) 5-325 MG tablet Take 1 tablet by mouth 4 (four) times daily as needed for pain 10/27/23     HYDROcodone-acetaminophen (NORCO/VICODIN) 5-325 MG tablet Take 1 tablet by mouth 4 (four) times daily as needed for pain 11/25/23     iron  sucrose (VENOFER) 20 MG/ML injection Inject 1,000 mg into the vein every 30 (thirty) days.    [provider]  levothyroxine (SYNTHROID) 200 MCG tablet Take 200 mcg by mouth at bedtime.    [provider]  losartan (COZAAR) 100 MG tablet Take 100 mg by mouth daily. Patient not taking: Reported on 11/16/2023    [provider]     Physical Exam: Vitals:   12/08/23 0030 12/08/23 0100 12/08/23 0159 12/08/23 0201  BP: 139/87 124/67  (!) 147/91  Pulse: 89   85  Resp: 16 14  16   Temp:    (!) 97.5 F (36.4 C)  TempSrc:    Oral  SpO2: 100%   100%  Weight:   98.5 kg   Height:   5\' 7"  (1.702 m)     Physical Exam Vitals and nursing note reviewed.  Constitutional:      Appearance: She is not ill-appearing.  HENT:     Head: Normocephalic.  Cardiovascular:     Rate and Rhythm: Normal rate and regular rhythm.  Pulmonary:     Effort: Pulmonary effort is normal.     Breath sounds: Normal breath sounds. No decreased breath sounds, wheezing, rhonchi or rales.  Abdominal:     Palpations: Abdomen is soft.  Musculoskeletal:     Right lower leg: No edema.     Left lower leg: No edema.  Skin:    General: Skin is warm.     Capillary Refill: Capillary refill takes less than 2 seconds.  Neurological:     Mental Status: She is alert and oriented to person, place, and time.  Psychiatric:        Mood and Affect: Mood  normal. Mood is not anxious.      Labs on Admission: I have personally reviewed following labs and imaging studies  CBC: Recent Labs  Lab 12/07/23 2111  WBC 4.9  NEUTROABS 3.4  HGB 6.5*  HCT 19.5*  MCV 84.8  PLT 184   Basic Metabolic Panel: Recent Labs  Lab 12/07/23 2111  NA 135  K 5.0  CL 96*  CO2 23  GLUCOSE 175*  BUN 60*  CREATININE 11.53*  CALCIUM 9.5   GFR: Estimated Creatinine Clearance: 8.2 mL/min (A) (by C-G formula based on SCr of 11.53 mg/dL (H)). Liver Function Tests: No results for input(s): "AST", "ALT", "ALKPHOS", "BILITOT", "PROT", "ALBUMIN" in the last 168 hours. No results for input(s): "LIPASE", "AMYLASE" in the last 168 hours. No results for input(s): "AMMONIA" in the last 168 hours. Coagulation Profile: No results for input(s): "INR", "PROTIME" in the last 168 hours. Cardiac Enzymes: No results for input(s): "CKTOTAL", "CKMB", "CKMBINDEX", "TROPONINI", "TROPONINIHS" in the last 168 hours. BNP (last 3 results) No results for input(s): "BNP" in the last 8760 hours. HbA1C: No results for input(s): "HGBA1C" in the last 72 hours. CBG: No results for input(s): "GLUCAP" in the last 168 hours. Lipid Profile: No results for input(s): "CHOL", "HDL", "LDLCALC", "TRIG", "CHOLHDL", "LDLDIRECT" in the last 72 hours. Thyroid Function Tests: No results for input(s): "TSH", "T4TOTAL", "FREET4", "T3FREE", "THYROIDAB" in the last 72 hours. Anemia Panel: No results for input(s): "VITAMINB12", "FOLATE", "FERRITIN", "TIBC", "IRON", "RETICCTPCT" in the last 72 hours. Urine analysis:    Component Value Date/Time   COLORURINE PINK (A) 02/18/2022 2033   APPEARANCEUR CLOUDY (A) 02/18/2022 2033   LABSPEC 1.015 02/18/2022 2033   PHURINE 8.5 (H) 02/18/2022 2033   GLUCOSEU 250 (A) 02/18/2022 2033   HGBUR LARGE (A) 02/18/2022 2033  BILIRUBINUR NEGATIVE 02/18/2022 2033   KETONESUR NEGATIVE 02/18/2022 2033   PROTEINUR >300 (A) 02/18/2022 2033   NITRITE POSITIVE (A)  02/18/2022 2033   LEUKOCYTESUR SMALL (A) 02/18/2022 2033    Radiological Exams on Admission: I have personally reviewed images DG Chest 2 View Result Date: 12/07/2023 CLINICAL DATA:  Shortness of breath EXAM: CHEST - 2 VIEW COMPARISON:  08/21/2022 FINDINGS: Cardiac shadow is stable. Dialysis catheter is again noted on the right. The lungs are well aerated bilaterally. No acute bony abnormality is seen. IMPRESSION: No active cardiopulmonary disease. Electronically Signed   By: Violeta Grey M.D.   On: 12/07/2023 22:04     EKG: My personal interpretation of EKG shows: EKG showing normal sinus rhythm heart rate 97.  There is no ST-T wave abnormality.    Assessment/Plan: Principal Problem:   Acute on chronic anemia Active Problems:   ESRD on dialysis [facility])   Essential hypertension   Symptomatic anemia   History of hysterectomy   Hyperlipidemia   Generalized anxiety disorder   Chronic depression    Assessment and Plan: Acute on chronic anemia Symptomatic anemia -Patient presenting with complaining of shortness of breath.  Reported she has history of chronic anemia and time to time she has some blood clots during hemodialysis.  Recently had an event of power loss and due to faulty cartilage of the hemodialysis machine patient lost blood per patient report. Of note history of endometriosis s/p hysterectomy.  No other source of bleeding at this time - Hemodynamically stable at presentation to ED - CBC showing hemoglobin 6.5.,  Low hematocrit 29, normal MCV, platelet and WBC count.  Baseline hemoglobin around 8.5-10. -Transfusing 1 unit of blood tonight. - Will follow-up with CBC in the morning to decide further blood transfusion.  Call to keep hemoglobin above 7.  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  Usually patient has 3 days of dialysis followed by 1 day break and then 2 days of dialysis.  Usually does 1.2 to 1.7 L fluid removal per HD  sessions. -No evidence of volume overload.  CBC no electrolyte derangement.  Consulted nephrology Dr. Higinio Love to assess for dialysis in the daytime. -Continue Sensipar.  Generalized anxiety disorder Depression -Currently not any antianxiety medication.   Hyperlipidemia -Currently not on Lipitor.  Essential hypertension -Continue Coreg 12.5 mg twice daily  Chronic pain syndrome - Continue Norco every 6 hour as needed for moderate-severe pain.  Reviewed PDMP.  Hypothyroidism - Continue levothyroxine.   DVT prophylaxis:  SCDs.  Deferring pharmacological DVT prophylaxis in the setting of low hemoglobin and high risk of bleeding Code Status:  Full Code Diet: Renal diet  Family Communication:   Family was present at bedside, at the time of interview. Opportunity was given to ask question and all questions were answered satisfactorily.  Disposition Plan: Continue monitor H&H transfuse as needed.  Goal hemoglobin above 7 Consults: Nephrology Admission status:   Observation, Telemetry bed  Severity of Illness: The appropriate patient status for this patient is OBSERVATION. Observation status is judged to be reasonable and necessary in order to provide the required intensity of service to ensure the patient's safety. The patient's presenting symptoms, physical exam findings, and initial radiographic and laboratory data in the context of their medical condition is felt to place them at decreased risk for further clinical deterioration. Furthermore, it is anticipated that the patient will be medically stable for discharge from the hospital within 2 midnights  of admission.     Camisha Srey, MD [facility]  How to contact the [facility] Attending or Consulting provider 7A - 7P or covering provider during after hours 7P -7A, for this patient.  Check the care team in [facility] and look for a) attending/consulting [facility] provider listed and b) the TR[facility] team listed Log into [email or web address] and use [facility]'s universal password to access. If you do not have the password, please contact the hospital operator. Locate the TR[facility] provider you are looking for under [facility] and page to a number that you can be directly reached. If you still have difficulty reaching the provider, please page the [facility] (Director on Call) for the Hospitalists listed on amion for assistance.  12/08/2023, 5:16 AM

## 2023-12-08 NOTE — TOC CM/SW Note (Signed)
 Transition of Care [facility]) - Inpatient Brief Assessment   Patient Details  Name: Holly [name] MRN: [id number] Date of Birth: [birth year]  Transition of Care [facility]) CM/SW Contact:    Tom-Johnson, Angelique Ken, RN Phone Number: 12/08/2023, 3:44 PM   Clinical Narrative:  Patient presented to the ED with Shortness of Breath and Fatigue. Hgb on admit noted at 6.5. 1U PRBC given. Admitted with Acute on Chronic Anemia.  Has hx of ESRD, on home Dialysis.   Patient reports hx of Anemia and Blood Loss d/t home Hemodialysis and has been on Erythropoietin and Iron Infusion at home as well.   From home with Significant other. Does not have children, both parents and four siblings supportive. Currently employed. Independent with care at home. Has a cane. PCP is  Abram Abraham, NP and uses [facility] on [address]   Patient not Medically ready for discharge.  CM will continue to follow as patient progresses with care towards discharge.            Transition of Care Asessment: Insurance and Status: Insurance coverage has been reviewed Patient has primary care physician: Yes Home environment has been reviewed: To return home Prior level of function:: Independent Prior/Current Home Services: Current home services (Home Hemodialysis) Social Drivers of Health Review: SDOH reviewed no interventions necessary Readmission risk has been reviewed: Yes Transition of care needs: no transition of care needs at this time

## 2023-12-08 NOTE — Discharge Summary (Signed)
 DISCHARGE SUMMARY  Holly [name]  MR#: [id number]  DOB:[birth year]  Date of Admission: 12/07/2023 Date of Discharge: 12/08/2023  Attending Physician:Holly Lezotte Silvestre Gunner, MD  Patient's NWG:NFAOZH, Holly L, NP-C  Disposition: D/C home   Follow-up Appts:  Follow-up Information     [name], Holly L, NP-C Follow up.   Specialty: Family Medicine Why: See your PCP for a recheck of your blood count (Hgb) in 3-5 days. 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  She reported a couple of episodes of significant blood loss at home related to malfunctions with her dialysis performance.  Hemoglobin was 6.5 at the time of presentation   Hospital Course:  Acute symptomatic anemia on chronic anemia of ESRD Likely due to blood loss related to malfunction during dialysis sessions -no evidence of other sources of blood loss at present -the patient was initially transfused 1 unit of PRBC, with resultant increase of hemoglobin from 6.0 to 7.0 - given that 7.0 was the lower limit target for our hemoglobin range, the decision was made to give the patient 1 more empiric unit of PRBC prior to her discharge -when the second unit of PRBC is infused she will then be cleared for discharge home to return to her usual HD schedule at home -this plan has been discussed  with Nephrology who agrees -she is advised to have a follow-up CBC with her primary care physician within the next 3-5 days   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 Utilizes home dialysis 5 days/week with schedule of 3 days of dialysis followed by 1 day of break and then 2 days of dialysis -no volume overload or acute electrolyte derangements during this admission - nephrology consulted and evaluated the patient but felt that she could simply return to her usual home regimen upon discharge without need for acute extra dialysis during her hospital stay   HTN Continue usual home Coreg dose   Chronic pain syndrome Continue usual home dose of Norco - avoided escalation of narcotics   Hypothyroidism Continue Synthroid   General Anxiety disorder/depression No changes were made in her usual home regimen  Allergies as of 12/08/2023       Reactions   Ciprofloxacin Anaphylaxis, Other (See Comments)   Caused Seizures  Seizures  Other reaction(s): Other (See Comments) Seizures Seizures     Caused Seizures  Seizures  Seizures  Other reaction(s): Other (See Comments) Seizures  Seizures  Seizures     Other reaction(s): Other (See Comments) Seizures  Seizures  Seizures     Caused Seizures  Caused Seizures  Seizures  Seizures  Other reaction(s): Other (See Comments) Seizures  Seizures  Seizures    Seizures Grand mal   Levetiracetam Anaphylaxis, Other (See Comments)   Medical Record H&P (unknown reaction) Medical Record H&P Other reaction(s): Other (See Comments)   Oxycodone-acetaminophen Nausea Only, Other (See Comments)   Rituximab Anaphylaxis, Swelling  Tramadol Anaphylaxis, Other (See Comments)   Caused seizures Other reaction(s): Other (See Comments) Medical Record H&P Medical Record H&P    Caused seizures Other Reaction(s): Other (See Comments) Caused seizures  Medical Record H&P  Medical Record H&P  Other reaction(s): seizures  Medical Record H&P  Medical Record H&P  Medical  Record H&P        Medication List     TAKE these medications    acyclovir 200 MG capsule Commonly known as: ZOVIRAX Take 200 mg by mouth 2 (two) times daily as needed (out breaks).   albuterol 108 (90 Base) MCG/ACT inhaler Commonly known as: VENTOLIN HFA Inhale 2-4 puffs into the lungs every 4 (four) hours as needed for wheezing (or cough).   atomoxetine 10 MG capsule Commonly known as: STRATTERA Take 1 capsule (10 mg total) by mouth 2 (two) times daily with a meal.   Auryxia 1 GM 210 MG(Fe) tablet Generic drug: ferric citrate Take 420 mg by mouth in the morning, at noon, and at bedtime.   carvedilol 12.5 MG tablet Commonly known as: COREG Take 12.5 mg by mouth 2 (two) times daily with a meal.   cetirizine 10 MG tablet Commonly known as: ZYRTEC Take 10 mg by mouth at bedtime.   cinacalcet 60 MG tablet Commonly known as: SENSIPAR Take 60 mg by mouth at bedtime.   clopidogrel 75 MG tablet Commonly known as: PLAVIX Take 75 mg by mouth daily.   diclofenac Sodium 1 % Gel Commonly known as: VOLTAREN Apply 1 application  topically 4 (four) times daily as needed (hand,feet,thighs and back pain).   epoetin alfa 19147 UNIT/ML injection Commonly known as: EPOGEN Inject 20,000 Units into the skin every 14 (fourteen) days.   escitalopram 10 MG tablet Commonly known as: Lexapro Take 1 tablet (10 mg total) by mouth daily.   Fosrenol 1000 MG chewable tablet Generic drug: lanthanum Chew 1,000 mg by mouth 3 (three) times daily with meals.   furosemide 80 MG tablet Commonly known as: LASIX Take 80 mg by mouth daily as needed for edema.   HYDROcodone-acetaminophen 5-325 MG tablet Commonly known as: NORCO/VICODIN Take 1 tablet by mouth 4 (four) times daily as needed for pain. What changed: reasons to take this   iron sucrose 20 MG/ML injection Commonly known as: VENOFER Inject 1,000 mg into the vein every 30 (thirty) days.   levothyroxine 200 MCG tablet Commonly  known as: SYNTHROID Take 200 mcg by mouth at bedtime.   losartan 100 MG tablet Commonly known as: COZAAR Take 100 mg by mouth daily.   Vitamin D (Ergocalciferol) 1.25 MG (50000 UNIT) Caps capsule Commonly known as: DRISDOL Take 50,000 Units by mouth once a week.        Day of Discharge BP (!) 145/88 (BP Location: Right Arm)   Pulse 72   Temp 97.8 F (36.6 C) (Oral)   Resp 16   Ht 5\' 7"  (1.702 m)   Wt 98.5 kg   LMP 03/31/2022 (Exact Date) Comment: pt bleeding last 6 weeks with recent multiple transfusions  SpO2 100%   BMI 34.01 kg/m   Physical Exam: General: No acute respiratory distress Lungs: Clear to auscultation bilaterally without wheezes or crackles Cardiovascular: Regular rate and rhythm without murmur gallop or rub normal S1 and S2 Abdomen: Nontender, nondistended, soft, bowel sounds positive, no rebound, no ascites, no appreciable mass Extremities: No significant cyanosis, clubbing, or edema bilateral lower extremities  Basic Metabolic Panel: Recent Labs  Lab 12/07/23 2111 12/08/23 0454  NA 135 135  K 5.0 4.8  CL 96* 100  CO2 23 20*  GLUCOSE 175* 91  BUN 60* 61*  CREATININE 11.53* 12.62*  CALCIUM 9.5 9.0    CBC: Recent Labs  Lab 12/07/23 2111 12/08/23 0454 12/08/23 1406  WBC 4.9 5.7 6.4  NEUTROABS 3.4  --   --   HGB 6.5* 6.0* 7.0*  HCT 19.5* 18.1* 20.9*  MCV 84.8 85.4 84.6  PLT 184 178 168    Time spent in discharge (includes decision making & examination of pt): 30 minutes  12/08/2023, 4:40 PM   Abbe Abate, MD [facility] Office  [phone]

## 2023-12-08 NOTE — Care Management Obs Status (Signed)
 MEDICARE OBSERVATION STATUS NOTIFICATION   Patient Details  Name: Holly [name] MRN: [id number] Date of Birth: December 01, 1985   Medicare Observation Status Notification Given:  Yes  MOON/OBS letter signed and copy left  Wynonia Hedges 12/08/2023, 11:09 AM

## 2023-12-08 NOTE — Progress Notes (Signed)
 New Admission Note:   Arrival Method: stretcher Mental Orientation: alert x 4 Telemetry: TELE ordered Assessment: Completed Skin: see flowsheet IV:NSL Pain: none Tubes: nonme Safety Measures: Safety Fall Prevention Plan has been discussed Admission: Completed [facility] Orientation: Patient has been orientated to the room, unit and staff.  Family:no family  at bedside  Orders have been reviewed and implemented. Will continue to monitor the patient. Call light has been placed within reach and bed alarm has been activated.   Charls Cooks BSN, RN Phone number: [phone]

## 2023-12-08 NOTE — TOC Transition Note (Signed)
 Transition of Care [facility]) - Discharge Note   Patient Details  Name: Holly [name] MRN: [id number] Date of Birth: 1985/11/03  Transition of Care [facility]) CM/SW Contact:  Tom-Johnson, Angelique Ken, RN Phone Number: 12/08/2023, 4:56 PM   Clinical Narrative:     Patient is scheduled for discharge today.  Outpatient f/u, hospital f/u and discharge instructions on AVS. No TOC needs or recommendations noted. Significant other, [name] to transport at discharge.  No further TOC needs noted.         Final next level of care: Home/Self Care Barriers to Discharge: Barriers Resolved   Patient Goals and CMS Choice Patient states their goals for this hospitalization and ongoing recovery are:: To return home CMS Medicare.gov Compare Post Acute Care list provided to:: Patient Choice offered to / list presented to : NA      Discharge Placement                Patient to be transferred to facility by: Significant other Name of family member notified: [facility] Additional resources added to the After Visit Summary for                  DME Arranged: N/A DME Agency: NA       HH Arranged: NA HH Agency: NA        Social Drivers of Health (SDOH) Interventions SDOH Screenings   Food Insecurity: No Food Insecurity (12/08/2023)  Housing: Low Risk  (12/08/2023)  Transportation Needs: No Transportation Needs (12/08/2023)  Utilities: Not At Risk (12/08/2023)  Alcohol Screen: Low Risk  (11/16/2023)  Depression (PHQ2-9): Low Risk  (11/16/2023)  Financial Resource Strain: Medium Risk (11/16/2023)  Physical Activity: Insufficiently Active (11/16/2023)  Social Connections: Socially Isolated (12/08/2023)  Stress: No Stress Concern Present (11/16/2023)  Tobacco Use: Medium Risk (12/08/2023)  Health Literacy: Adequate Health Literacy (11/16/2023)     Readmission Risk Interventions     No data to display

## 2023-12-08 NOTE — Consult Note (Signed)
 Renal Service Consult Note [facility]  [facility] [place] 12/08/2023 Holly Krabbe, MD Requesting Physician: Dr. Sharon Seller  Reason for Consult: ESRD patient with anemia and need for transfusion HPI: The patient is a 38 y.o. year-old w/ PMH as below who presented to ED complaining of shortness of breath.  Patient had a previous history of of anemia and blood loss due to home dialysis in the past.  She gets erythropoietin and iron infusions periodically at home.  Patient reported that she lost blood recently about a week ago due to a power outage, and then she lost no more blood just recently due to a faulty dialysis cartridge.  Her hemoglobin is usually about 8.  In the ED vital signs were stable, BUN 60, creatinine 11.5 hemoglobin 6.5, normal platelets and WBC.  Chest x-ray negative.  Patient was admitted by the hospitalist and we are asked to see for dialysis.   Pt seen in hospital room.  She has been on dialysis for total of 10 years, and has been doing home hemodialysis for about 2 years.  She gets help from her boyfriend.  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  Other history is as above.  Denies any shortness of breath, leg swelling.  She does 3 hours dialysis 5 days a week and her dry weight is 98 kg.   ROS - denies CP, no joint pain, no HA, no blurry vision, no rash, no diarrhea, no nausea/ vomiting  PMH: Anemia Anxiety ESR D on HD Hashimoto's disease HL HTN Hypothyroidism  Past Surgical History  Past Surgical History:  Procedure Laterality Date   A/V FISTULAGRAM Left 09/09/2022   Procedure: A/V Fistulagram;  Surgeon: Renford Dills, MD;  Location: [facility] INVASIVE CV LAB;  Service: Cardiovascular;  Laterality: Left;   ABDOMINAL HYSTERECTOMY  06/24/2022   ABDOMINAL SURGERY     AV FISTULA PLACEMENT Left 01/17/2022   Procedure: ARTERIOVENOUS (AV) FISTULA  CREATION ( RADIAL CEPHALIC);  Surgeon: Renford Dills, MD;  Location: [facility] ORS;  Service: Vascular;  Laterality: Left;   CERVICAL BIOPSY  W/ LOOP ELECTRODE EXCISION  2018   COLONOSCOPY N/A 01/30/2022   Procedure: COLONOSCOPY;  Surgeon: Jaynie Collins, DO;  Location: [facility] ENDOSCOPY;  Service: Gastroenterology;  Laterality: N/A;   COLONOSCOPY WITH PROPOFOL N/A 02/01/2022   Procedure: COLONOSCOPY WITH PROPOFOL;  Surgeon: Regis Bill, MD;  Location: [facility] ENDOSCOPY;  Service: Endoscopy;  Laterality: N/A;   DIAGNOSTIC LAPAROSCOPY     due to removal of PD catheters   DIALYSIS/PERMA CATHETER INSERTION N/A 06/09/2022   Procedure: DIALYSIS/PERMA CATHETER INSERTION;  Surgeon: Annice Needy, MD;  Location: [facility] INVASIVE CV LAB;  Service: Cardiovascular;  Laterality: N/A;   DIALYSIS/PERMA CATHETER INSERTION N/A 06/30/2022   Procedure: DIALYSIS/PERMA CATHETER INSERTION;  Surgeon: Cephus Shelling, MD;  Location: [facility] INVASIVE CV LAB;  Service: Cardiovascular;  Laterality: N/A;   DIALYSIS/PERMA CATHETER INSERTION N/A 09/28/2023   Procedure: DIALYSIS/PERMA CATHETER INSERTION;  Surgeon: Tyler Pita, MD;  Location: [facility] INVASIVE CV LAB;  Service: Cardiovascular;  Laterality: N/A;   DIALYSIS/PERMA CATHETER INSERTION N/A 09/30/2023   Procedure: DIALYSIS/PERMA CATHETER INSERTION;  Surgeon: Ethelene Hal, MD;  [facility] INVASIVE CV LAB;  Service: Cardiovascular;  Laterality: N/A;   DIALYSIS/PERMA CATHETER INSERTION N/A 10/23/2023   Procedure: DIALYSIS/PERMA CATHETER INSERTION;  Surgeon: Dagoberto Ligas, MD;  Location: [facility] INVASIVE CV LAB;  Service: Cardiovascular;  Laterality: N/A;   DIALYSIS/PERMA CATHETER REMOVAL N/A 09/28/2023   Procedure: DIALYSIS/PERMA CATHETER REMOVAL;  Surgeon: Tyler Pita, MD;  Location: [facility] INVASIVE CV LAB;  Service: Cardiovascular;  Laterality: N/A;   DIALYSIS/PERMA CATHETER REMOVAL N/A 10/23/2023   Procedure: DIALYSIS/PERMA CATHETER REMOVAL;  Surgeon: Dagoberto Ligas, MD;  Location: [facility] INVASIVE CV LAB;  Service: Cardiovascular;  Laterality: N/A;   ESOPHAGOGASTRODUODENOSCOPY N/A 01/30/2022   Procedure: ESOPHAGOGASTRODUODENOSCOPY (EGD);  Surgeon: Jaynie Collins, DO;  Location: [facility] ENDOSCOPY;  Service: Gastroenterology;  Laterality: N/A;   ESOPHAGOGASTRODUODENOSCOPY (EGD) WITH PROPOFOL N/A 02/01/2022   Procedure: ESOPHAGOGASTRODUODENOSCOPY (EGD) WITH PROPOFOL;  Surgeon: Regis Bill, MD;  Location: [facility] ENDOSCOPY;  Service: Endoscopy;  Laterality: N/A;   IR FLUORO GUIDE CV LINE RIGHT  10/24/2023   IR REMOVAL TUN CV CATH W/O FL  10/24/2023   IR US GUIDE VASC ACCESS RIGHT  10/24/2023   PERIPHERAL VASCULAR BALLOON ANGIOPLASTY Right 09/28/2023   Procedure: PERIPHERAL VASCULAR BALLOON ANGIOPLASTY;  Surgeon: Tyler Pita, MD;  Location: [facility] INVASIVE CV LAB;  Service: Cardiovascular;  Laterality: Right;  innominate sheath   RENAL BIOPSY     x 2   TUMOR REMOVAL     right face   UTERINE ARTERY EMBOLIZATION  05/04/2022   Family History  Family History  Problem Relation Age of Onset   Multiple sclerosis Mother    Cancer Father    COPD Father    Social History  reports that she quit smoking about 2 years ago. Her smoking use included cigarettes. She has never used smokeless tobacco. She reports that she does not drink alcohol and does not use drugs. Allergies  Allergies  Allergen Reactions   Ciprofloxacin Anaphylaxis and Other (See Comments)    Caused Seizures   Seizures   Other reaction(s): Other (See Comments)  Seizures  Seizures     Caused Seizures  Seizures  Seizures  Other reaction(s): Other (See Comments) Seizures  Seizures  Seizures     Other reaction(s): Other (See Comments) Seizures  Seizures  Seizures     Caused Seizures  Caused Seizures  Seizures  Seizures  Other reaction(s): Other (See Comments) Seizures  Seizures  Seizures    Seizures Grand mal   Levetiracetam Anaphylaxis and Other (See Comments)    Medical Record  H&P (unknown reaction)  Medical Record H&P  Other reaction(s): Other (See Comments)   Oxycodone-Acetaminophen Nausea Only and Other (See Comments)   Rituximab Anaphylaxis and Swelling   Tramadol Anaphylaxis and Other (See Comments)    Caused seizures  Other reaction(s): Other (See Comments)  Medical Record H&P  Medical Record H&P    Caused seizures  Other Reaction(s): Other (See Comments)  Caused seizures  Medical Record H&P  Medical Record H&P  Other reaction(s): seizures  Medical Record H&P  Medical Record H&P  Medical Record H&P   Home medications Prior to Admission medications   Medication Sig Start Date End Date Taking? Authorizing Provider  acyclovir (ZOVIRAX) 200 MG capsule Take 200 mg by mouth 2 (two) times daily as needed (out breaks). 08/14/21  Yes [provider]  albuterol (VENTOLIN HFA) 108 (90 Base) MCG/ACT inhaler Inhale 2-4 puffs into the lungs every 4 (four) hours as needed for wheezing (or cough). 12/04/22  Yes [name], [name], NP-C  carvedilol (COREG) 12.5 MG tablet Take 12.5 mg by mouth 2 (two) times daily with a meal.   Yes [provider]  cetirizine (ZYRTEC) 10 MG tablet Take 10 mg by mouth at  bedtime.   Yes [provider]  cinacalcet (SENSIPAR) 60 MG tablet Take 60 mg by mouth at bedtime.   Yes [provider]  diclofenac Sodium (VOLTAREN) 1 % GEL Apply 1 application  topically 4 (four) times daily as needed (hand,feet,thighs and back pain).   Yes [provider]  epoetin alfa (EPOGEN) 20000 UNIT/ML injection Inject 20,000 Units into the skin every 14 (fourteen) days.   Yes [provider]  ferric citrate (AURYXIA) 1 GM 210 MG(Fe) tablet Take 420 mg by mouth in the morning, at noon, and at bedtime.   Yes [provider]  furosemide (LASIX) 80 MG tablet Take 80 mg by mouth daily as needed for edema. 11/13/21  Yes [provider]  HYDROcodone-acetaminophen (NORCO/VICODIN) 5-325 MG  tablet Take 1 tablet by mouth 4 (four) times daily as needed for pain. Patient taking differently: Take 1 tablet by mouth 4 (four) times daily as needed (Pain management). 08/27/23  Yes   iron sucrose (VENOFER) 20 MG/ML injection Inject 1,000 mg into the vein every 30 (thirty) days.   Yes [provider]  levothyroxine (SYNTHROID) 200 MCG tablet Take 200 mcg by mouth at bedtime.   Yes [provider]  atomoxetine (STRATTERA) 10 MG capsule Take 1 capsule (10 mg total) by mouth 2 (two) times daily with a meal. Patient not taking: Reported on 12/08/2023 09/15/23   Arfeen, Bronson Canny, MD  clopidogrel (PLAVIX) 75 MG tablet Take 75 mg by mouth daily. Patient not taking: Reported on 12/08/2023 10/21/23 10/20/24  [provider]  escitalopram (LEXAPRO) 10 MG tablet Take 1 tablet (10 mg total) by mouth daily. Patient not taking: Reported on 11/16/2023 02/09/23   Arfeen, Bronson Canny, MD  FOSRENOL 1000 MG chewable tablet Chew 1,000 mg by mouth 3 (three) times daily with meals. Patient not taking: Reported on 12/08/2023    [provider]  losartan (COZAAR) 100 MG tablet Take 100 mg by mouth daily. Patient not taking: Reported on 11/16/2023    [provider]  Vitamin D, Ergocalciferol, (DRISDOL) 1.25 MG (50000 UNIT) CAPS capsule Take 50,000 Units by mouth once a week. Patient not taking: Reported on 12/08/2023    [provider]     Vitals:   12/08/23 0526 12/08/23 0808 12/08/23 0810 12/08/23 1531  BP: 133/67 (!) 165/86 (!) 148/87 (!) 145/88  Pulse: 87 87 86 72  Resp: 16 16    Temp: 98 F (36.7 C) 97.6 F (36.4 C) 97.7 F (36.5 C) 97.8 F (36.6 C)  TempSrc: Oral Oral Oral Oral  SpO2: 100% 100% 100% 100%  Weight:      Height:       Exam Gen alert, no distress No rash, cyanosis or gangrene Sclera anicteric, throat clear  No jvd or bruits Chest clear bilat to bases, no rales/ wheezing RRR no MRG Abd soft ntnd no mass or ascites +bs GU defer MS no joint  effusions or deformity Ext no LE or UE edema, no other edema Neuro is alert, Ox 3 , nf    RIJ TDC, L arm AVF+bruit    Renal-related home meds: Coreg 12.5 mg twice daily Sensipar 60 mg at bedtime Auryxia 2 tablets AC 3 times daily Fosrenol 1 g AC 3 times daily Lasix 80 mg daily as needed for edema Losartan 100 mg daily Others: Synthroid, Lexapro, Plavix, Strattera    OP HD: Home HD 5 days per week f/b Dr Zelda Hickman ([facility] I believe)  3h  98kg dry wt  RIJ TDC    Assessment/ Plan: Anemia: due to ABL from faulty HD cartridge. Also lost blood 2 wks ago during a power outage. Getting prbc's per pmd.  ESRD: on home HD, 3hrs x 5 days/wk, followed by [facility] it appears. No labs or volume issues here, she does not require inpatient HD at this time. Pt states she can do her next HD at home tonight or tomorrow after she is discharged. Will follow up in am tomorrow.  HTN: bp's are stable 130-150 range. Cont home meds.  Volume: euvolemic on exam, at dry wt today.   Larry Poag  MD [facility] 12/08/2023, 3:56 PM  Recent Labs  Lab 12/07/23 2111 12/08/23 0454 12/08/23 1406  HGB 6.5* 6.0* 7.0*  ALBUMIN  --  3.4*  --   CALCIUM 9.5 9.0  --   CREATININE 11.53* 12.62*  --   K 5.0 4.8  --    Inpatient medications:  sodium chloride   Intravenous Once   carvedilol  12.5 mg Oral BID WC   cinacalcet  60 mg Oral QHS   ferric citrate  420 mg Oral TID WC   levothyroxine  200 mcg Oral Q0600   sodium chloride flush  3 mL Intravenous Q12H    acetaminophen, albuterol, HYDROcodone-acetaminophen, ondansetron **OR** ondansetron (ZOFRAN) IV, mouth rinse

## 2023-12-11 LAB — BPAM RBC
Blood Product Expiration Date: 202505022359
Blood Product Expiration Date: 202505182359
Blood Product Expiration Date: 202505232359
ISSUE DATE / TIME: 202504150812
ISSUE DATE / TIME: 202504151536
Unit Type and Rh: 5100
Unit Type and Rh: 5100
Unit Type and Rh: 5100

## 2023-12-11 LAB — TYPE AND SCREEN
ABO/RH(D): O POS
Antibody Screen: NEGATIVE
Donor AG Type: NEGATIVE
Donor AG Type: NEGATIVE
PT AG Type: NEGATIVE
Unit division: 0
Unit division: 0
Unit division: 0

## 2023-12-15 ENCOUNTER — Encounter (HOSPITAL_COMMUNITY): Payer: Medicare Other | Admitting: Psychiatry

## 2023-12-15 NOTE — Progress Notes (Signed)
 Patient is at dialysis center at [facility] due to complication of fistula.  She requested to reschedule appointment.

## 2023-12-23 ENCOUNTER — Other Ambulatory Visit (HOSPITAL_COMMUNITY): Payer: Self-pay

## 2023-12-25 ENCOUNTER — Ambulatory Visit (INDEPENDENT_AMBULATORY_CARE_PROVIDER_SITE_OTHER)

## 2023-12-25 ENCOUNTER — Ambulatory Visit (INDEPENDENT_AMBULATORY_CARE_PROVIDER_SITE_OTHER): Admitting: Family Medicine

## 2023-12-25 ENCOUNTER — Encounter: Payer: Self-pay | Admitting: Family Medicine

## 2023-12-25 ENCOUNTER — Telehealth: Payer: Self-pay

## 2023-12-25 VITALS — BP 130/90 | HR 95 | Temp 97.3°F | Ht 67.0 in | Wt 213.0 lb

## 2023-12-25 DIAGNOSIS — Z09 Encounter for follow-up examination after completed treatment for conditions other than malignant neoplasm: Secondary | ICD-10-CM

## 2023-12-25 DIAGNOSIS — S2249XA Multiple fractures of ribs, unspecified side, initial encounter for closed fracture: Secondary | ICD-10-CM | POA: Insufficient documentation

## 2023-12-25 DIAGNOSIS — R0602 Shortness of breath: Secondary | ICD-10-CM

## 2023-12-25 DIAGNOSIS — R062 Wheezing: Secondary | ICD-10-CM

## 2023-12-25 DIAGNOSIS — N186 End stage renal disease: Secondary | ICD-10-CM | POA: Diagnosis not present

## 2023-12-25 DIAGNOSIS — Z992 Dependence on renal dialysis: Secondary | ICD-10-CM

## 2023-12-25 MED ORDER — PREDNISONE 20 MG PO TABS: 40.0000 mg | ORAL_TABLET | Freq: Every day | ORAL | 0 refills | Status: DC

## 2023-12-25 MED ORDER — IPRATROPIUM-ALBUTEROL 0.5-2.5 (3) MG/3ML IN SOLN: 3.0000 mL | Freq: Four times a day (QID) | RESPIRATORY_TRACT | 0 refills | Status: DC | PRN

## 2023-12-25 NOTE — Patient Instructions (Signed)
 Visit Information  Thank you for taking time to visit with me today. Please don't hesitate to contact me if I can be of assistance to you before our next scheduled telephone appointment.  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   Goal:  Over the next 30 days, the patient will not experience hospital readmission  Interventions:  Transitions of Care: Doctor Visits  - discussed the importance of doctor visits   Chronic Kidney Disease Interventions: Evaluation of current treatment plan related to chronic kidney disease self management and patient's adherence to plan as established by provider      Reviewed medications with patient and discussed importance of compliance    Reviewed scheduled/upcoming provider appointments including    Discussed plans with patient for ongoing care management follow up and provided patient with direct contact information for care management team    Assessed social determinant of health barriers    Last practice recorded BP readings:  BP Readings from Last 3 Encounters:  12/08/23 (!) 145/88  10/24/23 (!) 162/94  10/23/23 127/85   Most recent eGFR/CrCl: No results found for: "EGFR"  No components found for: "CRCL"  Patient Self Care Activities:  Attend all scheduled provider  appointments - PCP appointment scheduled today 12/25/23 Call pharmacy for medication refills 3-7 days in advance of running out of medications Call provider office for new concerns or questions  Notify RN Care Manager of TOC call rescheduling needs Participate in Transition of Care Program/Attend TOC scheduled calls Take medications as prescribed    Plan:  Telephone follow up appointment with care management team member scheduled for:  12/31/23 with primary TOC RN, Laine Tousey 3pm The patient has been provided with contact information for the care management team and has been advised to call with any health related questions or concerns.         Patient verbalizes understanding of instructions and care plan provided today and agrees to view in MyChart. Active MyChart status and patient understanding of how to access instructions and care plan via MyChart confirmed with patient.     Telephone follow up appointment with care management team member scheduled for:12/31/23 with primary [facility] RN Laine Tousey 3pm The patient has been provided with contact information for the care management team and has been advised to call with any health related questions or concerns.   Please call the care guide team at [phone] if you need to cancel or reschedule your appointment.   Please call the Suicide and Crisis Lifeline: 988 call the [place]  National Suicide Prevention Lifeline: (248)408-1033 or TTY: 431 302 3106 TTY (316)312-2182) to talk to a trained counselor call 911 if you are experiencing a Mental Health or Behavioral Health Crisis or need someone to talk to.  Tonia Frankel RN, CCM [place]  VBCI-Population Health RN Care Manager [phone]

## 2023-12-25 NOTE — Progress Notes (Signed)
 I will send in oral steroids for the next few days. No significant fluid in her lungs.

## 2023-12-25 NOTE — Transitions of Care (Post Inpatient/ED Visit) (Signed)
 12/25/2023  Name: Holly [name] MRN: [id number] DOB: 01-09-1986  Today's TOC FU Call Status: Today's TOC FU Call Status:: Successful TOC FU Call Completed TOC FU Call Complete Date: 12/25/23 Patient's Name and Date of Birth confirmed.  Transition Care Management Follow-up Telephone Call Date of Discharge: 12/24/23 Discharge Facility: Other ([facility]) Name of [facility] ([facility]) [facility] Facility: [facility] Type of Discharge: Inpatient Admission Primary Inpatient Discharge Diagnosis:: Closed fracture of multiple ribs, unspecified laterality, initial encounter How have you been since you were released from the hospital?: Better Any questions or concerns?: No  Items Reviewed: Did you receive and understand the discharge instructions provided?: Yes Medications obtained,verified, and reconciled?: Yes (Medications Reviewed) Any new allergies since your discharge?: No (Patient reports [facility]  caused anaphalaxis) Dietary orders reviewed?: Yes Type of Diet Ordered:: patient follows renal diet Do you have support at home?: Yes Name of Support/Comfort Primary Source: patient states boyfriend lives with her and is trained with HD  Medications Reviewed Today: Medications Reviewed Today     Reviewed by Sharmaine Dearth, RN (Registered Nurse) on 12/25/23 at 1113  Med List Status: <None>   Medication Order Taking? Sig Documenting Provider Last Dose Status Informant  acetaminophen  (TYLENOL ) 500 MG tablet [id number] Yes Take 500 mg by mouth every 6 (six) hours as needed. [provider] Taking Active   acyclovir  (ZOVIRAX ) 200 MG capsule [id number] Yes Take 200 mg by mouth 2 (two) times daily as needed (out breaks). [provider] Taking Active Self, Pharmacy Records  albuterol  (VENTOLIN  HFA) 108 [phone] Base) MCG/ACT inhaler [id number] Yes Inhale 2-4 puffs into the lungs every 4 (four) hours as needed for wheezing (or cough). Abram Abraham, NP-C  Taking Active Self, Pharmacy Records  amLODipine  (NORVASC ) 10 MG tablet [id number] Yes Take 10 mg by mouth daily. Per [facility] discharge 12/24/23 [provider] Taking Active   ascorbic acid (VITAMIN C) 250 MG CHEW [id number] Yes Chew 250 mg by mouth daily. [provider] Taking Active   atomoxetine  (STRATTERA ) 10 MG capsule [id number] No Take 1 capsule (10 mg total) by mouth 2 (two) times daily with a meal.  Patient not taking: Reported on 12/08/2023   Arturo Late, MD Not Taking Active Self, Pharmacy Records           Med Note [facility] Pierre   Fri Sep 25, 2023  3:39 PM) On hold  carvedilol  (COREG ) 12.5 MG tablet [id number] Yes Take 12.5 mg by mouth 2 (two) times daily with a meal. [provider] Taking Active Self, Pharmacy Records           Med Note Neila Bally, HEATHER L   Fri Nov 08, 2021 10:25 AM)    cetirizine (ZYRTEC) 10 MG tablet [id number] Yes Take 10 mg by mouth at bedtime. [provider] Taking Active Self, Pharmacy Records  cinacalcet  (SENSIPAR ) 60 MG tablet [id number] Yes Take 60 mg by mouth at bedtime. [provider] Taking Active Self, Pharmacy Records           Med Note [facility] Pierre   Fri Sep 25, 2023  3:36 PM) Filled with [facility]  clopidogrel (PLAVIX) 75 MG tablet [id number]  Take 75 mg by mouth daily.  Patient not taking: Reported on 12/08/2023   [provider]  Active Self, Pharmacy Records  cyclobenzaprine (FLEXERIL) 10 MG tablet [id number] Yes Take 10 mg by mouth 2 (two) times daily as needed for muscle spasms (per [facility] discharged 12/24/23). [provider] Taking Active  diclofenac  Sodium (VOLTAREN ) 1 % GEL [id number] Yes Apply 1 application  topically 4 (four) times daily as needed (hand,feet,thighs and back pain). [provider] Taking Active Self, Pharmacy Records           Med Note [facility] Pierre   Fri Sep 25, 2023  3:40 PM)    EPINEPHrine  0.3 mg/0.3 mL IJ SOAJ injection [id number] Yes Inject 0.3  mg into the muscle as needed for anaphylaxis. [provider] Taking Active   epoetin  alfa (EPOGEN ) 20000 UNIT/ML injection [id number] Yes Inject 20,000 Units into the skin every 14 (fourteen) days. [provider] Taking Active Self, Pharmacy Records  escitalopram  (LEXAPRO ) 10 MG tablet [id number] No Take 1 tablet (10 mg total) by mouth daily.  Patient not taking: Reported on 11/16/2023   Arturo Late, MD Not Taking Active Self, Pharmacy Records  ferric citrate  (AURYXIA ) 1 GM 210 MG(Fe) tablet [id number] Yes Take 420 mg by mouth in the morning, at noon, and at bedtime. [provider] Taking Active Self, Pharmacy Records  FOSRENOL  1000 MG chewable tablet [id number] No Chew 1,000 mg by mouth 3 (three) times daily with meals.  Patient not taking: Reported on 12/08/2023   [provider] Not Taking Active Self, Pharmacy Records  furosemide  (LASIX ) 80 MG tablet [id number] No Take 80 mg by mouth daily as needed for edema.  Patient not taking: Reported on 12/25/2023   [provider] Not Taking Active Self, Pharmacy Records  gabapentin  (NEURONTIN ) 100 MG capsule [id number] Yes Take 100 mg by mouth at bedtime. Per [facility] discharged 12/24/23 [provider] Taking Active   HYDROcodone -acetaminophen  (NORCO/VICODIN) 5-325 MG tablet [id number] Yes Take 1 tablet by mouth 4 (four) times daily as needed for pain.  Patient taking differently: Take 1 tablet by mouth 4 (four) times daily as needed (Pain management). Per [facility] discharged 12/24/23 10-325mg  Take 1 tab every 4 hours as needed up to 5 days - will discuss with pain management MD    Taking Active Self, Pharmacy Records  iron  sucrose (VENOFER ) 20 MG/ML injection [id number] Yes Inject 1,000 mg into the vein every 30 (thirty) days. [provider] Taking Active Self, Pharmacy Records  levothyroxine  (SYNTHROID ) 200 MCG tablet [id number] Yes Take 200 mcg by mouth at bedtime. [provider] Taking  Active Self, Pharmacy Records  losartan  (COZAAR ) 100 MG tablet [id number] No Take 100 mg by mouth daily.  Patient not taking: Reported on 11/16/2023   [provider] Not Taking Active Self, Pharmacy Records           Med Note [facility] Pierre   Fri Sep 25, 2023  3:36 PM) On hold  Vitamin D, Ergocalciferol, (DRISDOL) 1.25 MG (50000 UNIT) CAPS capsule [id number] Yes Take 50,000 Units by mouth once a week. [provider] Taking Active Self, Pharmacy Records            Home Care and Equipment/Supplies: Were Home Health Services Ordered?: Yes Name of Home Health Agency:: patient states talked with [facility] and felt the Home health aide was a waste - patient states she contacted her insurance who has 120 hours for custodial Has Agency set up a time to come to your home?: No EMR reviewed for Home Health Orders:  (patient seems to have declined Home Health and prefer custodial through her insurance- states she does not feel home health would be needed) Any new equipment or medical supplies ordered?: No  Functional Questionnaire: Do you need assistance with bathing/showering or dressing?: No Do  you need assistance with meal preparation?: No Do you need assistance with eating?: No Do you have difficulty maintaining continence: No (patient states she urinates "a very small amount") Do you need assistance with getting out of bed/getting out of a chair/moving?: No Do you have difficulty managing or taking your medications?: No  Follow up appointments reviewed: PCP Follow-up appointment confirmed?: Yes Date of PCP follow-up appointment?: 12/25/23 Follow-up Provider: Alyson Back NP Specialist Hospital Follow-up appointment confirmed?: Yes Date of Specialist follow-up appointment?: 03/17/24 Follow-Up Specialty Provider:: [facility] for anemia Do you need transportation to your follow-up appointment?: No Do you understand care options if your condition(s) worsen?:  Yes-patient verbalized understanding  SDOH Interventions Today    Flowsheet Row Most Recent Value  SDOH Interventions   Food Insecurity Interventions Intervention Not Indicated  Housing Interventions Intervention Not Indicated  Transportation Interventions Intervention Not Indicated  Utilities Interventions Intervention Not Indicated       Goals Addressed             This Visit's Progress    VBCI Transitions of Care (TOC) Care Plan       Problems:  Recent Hospitalization for treatment of Closed fracture of multiple ribs, unspecified laterality, initial encounter Comorbidities: 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   Goal:  Over the next 30 days, the patient will not experience hospital readmission  Interventions:  Transitions of Care: Doctor Visits  - discussed the importance of doctor visits   Chronic Kidney Disease Interventions: Evaluation of current treatment plan related to chronic kidney disease self management and patient's adherence to plan as established by provider      Reviewed medications with patient and discussed importance of compliance    Reviewed scheduled/upcoming provider appointments including    Discussed plans with patient for ongoing care management follow up and provided patient with direct contact information for care management team    Assessed social determinant of health barriers    Last practice recorded BP readings:  BP Readings from Last 3 Encounters:  12/08/23 (!) 145/88  10/24/23 (!) 162/94  10/23/23 127/85   Most recent eGFR/CrCl: No results found for: "EGFR"  No components found for: "CRCL"  Patient Self Care Activities:  Attend  all scheduled provider appointments - PCP appointment scheduled today 12/25/23 Call pharmacy for medication refills 3-7 days in advance of running out of medications Call provider office for new concerns or questions  Notify RN Care Manager of TOC call rescheduling needs Participate in Transition of Care Program/Attend TOC scheduled calls Take medications as prescribed    Plan:  Telephone follow up appointment with care management team member scheduled for:  12/31/23 with primary TOC RN, Laine Tousey 3pm The patient has been provided with contact information for the care management team and has been advised to call with any health related questions or concerns.         Tonia Frankel RN, CCM [place]  VBCI-Population Health RN Care Manager [phone]

## 2023-12-25 NOTE — Progress Notes (Unsigned)
 Subjective:     Patient ID: Holly [name], female    DOB: Mar 20, 1986, 38 y.o.   MRN: [id number]  Chief Complaint  Patient presents with   Hospitalization Follow-up    Could not breath in this morning. Pt took pain medication but help a little.    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    12/11/2023-01/15/2024   Health Maintenance Due  Topic Date Due   DTaP/Tdap/Td (1 - Tdap) Never done   Pneumococcal Vaccine 54-30 Years old (1 of 2 - PCV) 03/08/2007   COVID-19 Vaccine (9 - 2024-25 season) 04/26/2023    Past Medical History:  Diagnosis Date   Anemia    Anxiety    Arthritis    hands   Asthmatic bronchitis 02/11/2023   Blood transfusion without reported diagnosis    Cancer (HCC)    Complication of anesthesia    with Fentanyl  she is difficult to awake   Depression    Dyspnea    due to anemia   Epilepsy (HCC)    ESRD on hemodialysis (HCC)    home HD 3 hrs, 5 days per wk   Hashimoto's disease    History of kidney stones    HLD (hyperlipidemia)    Hypertension    Hypothyroidism    Membranoproliferative glomerulonephritis type 2, idiopathic    Pneumonia    Thyroid  disease     Past Surgical History:  Procedure Laterality Date   A/V FISTULAGRAM Left 09/09/2022   Procedure: A/V Fistulagram;  Surgeon: Jackquelyn Mass, MD;  Location: [facility] INVASIVE CV LAB;  Service: Cardiovascular;  Laterality: Left;   ABDOMINAL HYSTERECTOMY  06/24/2022   ABDOMINAL SURGERY     AV FISTULA PLACEMENT Left 01/17/2022   Procedure: ARTERIOVENOUS (AV) FISTULA CREATION ( RADIAL CEPHALIC);  Surgeon: Jackquelyn Mass, MD;  Location: [facility] ORS;  Service: Vascular;  Laterality: Left;   CERVICAL BIOPSY  W/ LOOP ELECTRODE EXCISION  2018   COLONOSCOPY N/A 01/30/2022    Procedure: COLONOSCOPY;  Surgeon: Quintin Buckle, DO;  Location: [facility] ENDOSCOPY;  Service: Gastroenterology;  Laterality: N/A;   COLONOSCOPY WITH PROPOFOL  N/A 02/01/2022   Procedure: COLONOSCOPY WITH PROPOFOL ;  Surgeon: Shane Darling, MD;  Location: [facility] ENDOSCOPY;  Service: Endoscopy;  Laterality: N/A;   DIAGNOSTIC LAPAROSCOPY     due to removal of PD catheters   DIALYSIS/PERMA CATHETER INSERTION N/A 06/09/2022   Procedure: DIALYSIS/PERMA CATHETER INSERTION;  Surgeon: Celso College, MD;  Location: [facility] INVASIVE CV LAB;  Service: Cardiovascular;  Laterality: N/A;   DIALYSIS/PERMA CATHETER INSERTION N/A 06/30/2022   Procedure: DIALYSIS/PERMA CATHETER INSERTION;  Surgeon: Young Hensen, MD;  Location: [facility] INVASIVE CV LAB;  Service: Cardiovascular;  Laterality: N/A;   DIALYSIS/PERMA CATHETER INSERTION N/A 09/28/2023   Procedure: DIALYSIS/PERMA CATHETER INSERTION;  Surgeon: Baron Border, MD;  Location: [facility] INVASIVE CV LAB;  Service: Cardiovascular;  Laterality: N/A;   DIALYSIS/PERMA CATHETER INSERTION N/A 09/30/2023   Procedure: DIALYSIS/PERMA CATHETER INSERTION;  Surgeon: Patrick Boor, MD;  Location: [facility] INVASIVE CV LAB;  Service: Cardiovascular;  Laterality: N/A;   DIALYSIS/PERMA CATHETER INSERTION N/A 10/23/2023   Procedure: DIALYSIS/PERMA CATHETER INSERTION;  Surgeon: Melodie Spry, MD;  Location: [facility] INVASIVE CV LAB;  Service: Cardiovascular;  Laterality: N/A;   DIALYSIS/PERMA CATHETER REMOVAL N/A 09/28/2023   Procedure: DIALYSIS/PERMA CATHETER REMOVAL;  Surgeon: Baron Border, MD;  Location: [facility] INVASIVE CV LAB;  Service: Cardiovascular;  Laterality: N/A;   DIALYSIS/PERMA CATHETER REMOVAL N/A 10/23/2023   Procedure: DIALYSIS/PERMA CATHETER REMOVAL;  Surgeon: Melodie Spry, MD;  Location: [facility] INVASIVE CV LAB;  Service: Cardiovascular;  Laterality: N/A;   ESOPHAGOGASTRODUODENOSCOPY N/A 01/30/2022   Procedure: ESOPHAGOGASTRODUODENOSCOPY (EGD);  Surgeon: Quintin Buckle,  DO;  Location: [facility] ENDOSCOPY;  Service: Gastroenterology;  Laterality: N/A;   ESOPHAGOGASTRODUODENOSCOPY (EGD) WITH PROPOFOL  N/A 02/01/2022   Procedure: ESOPHAGOGASTRODUODENOSCOPY (EGD) WITH PROPOFOL ;  Surgeon: Shane Darling, MD;  Location: [facility] ENDOSCOPY;  Service: Endoscopy;  Laterality: N/A;   IR FLUORO GUIDE CV LINE RIGHT  10/24/2023   IR REMOVAL TUN CV CATH W/O FL  10/24/2023   IR US  GUIDE VASC ACCESS RIGHT  10/24/2023   PERIPHERAL VASCULAR BALLOON ANGIOPLASTY Right 09/28/2023   Procedure: PERIPHERAL VASCULAR BALLOON ANGIOPLASTY;  Surgeon: Baron Border, MD;  Location: [facility] INVASIVE CV LAB;  Service: Cardiovascular;  Laterality: Right;  innominate sheath   RENAL BIOPSY     x 2   TUMOR REMOVAL     right face   UTERINE ARTERY EMBOLIZATION  05/04/2022    Family History  Problem Relation Age of Onset   Multiple sclerosis Mother    Cancer Father    COPD Father     Social History   Socioeconomic History   Marital status: Divorced    Spouse name: [name]   Number of children: Not on file   Years of education: Not on file   Highest education level: Associate degree: occupational, Scientist, product/process development, or vocational program  Occupational History   Occupation: [employer]  Tobacco Use   Smoking status: Former    Current packs/day: 0.00    Types: Cigarettes    Quit date: 2023    Years since quitting: 2.3   Smokeless tobacco: Never  Vaping Use   Vaping status: Never Used  Substance and Sexual Activity   Alcohol use: Never   Drug use: Never   Sexual activity: Not Currently  Other Topics Concern   Not on file  Social History Narrative   Lives with [name], significant other. One pet, cat.    Social Drivers of Health   Financial Resource Strain: Medium Risk (12/23/2023)   Received from [facility]   Overall Financial Resource Strain (CARDIA)    Difficulty of Paying Living Expenses: Somewhat hard  Food Insecurity: No Food Insecurity (12/25/2023)   Hunger Vital  Sign    Worried About Running Out of Food in the Last Year: Never true    Ran Out of Food in the Last Year: Never true  Recent Concern: Food Insecurity - Food Insecurity Present (12/23/2023)   Received from [facility]   Hunger Vital Sign    Worried About Running Out of Food in the Last Year: Sometimes true    Ran Out of Food in the Last Year: Never true  Transportation Needs: No Transportation Needs (12/25/2023)   PRAPARE - Administrator, Civil Service (Medical): No    Lack of Transportation (Non-Medical): No  Physical Activity: Insufficiently Active (11/16/2023)   Exercise Vital Sign    Days of Exercise per Week: 3 days    Minutes of Exercise per Session: 30 min  Stress: No Stress Concern Present (11/16/2023)   [employer] of Occupational Health - Occupational Stress Questionnaire  Feeling of Stress : Not at all  Social Connections: Socially Isolated (12/08/2023)   Social Connection and Isolation Panel [NHANES]    Frequency of Communication with Friends and Family: Twice a week    Frequency of Social Gatherings with Friends and Family: Never    Attends Religious Services: Never    Database administrator or Organizations: No    Attends Banker Meetings: Never    Marital Status: Divorced  Catering manager Violence: Not At Risk (12/25/2023)   Humiliation, Afraid, Rape, and [name] questionnaire    Fear of Current or Ex-Partner: No    Emotionally Abused: No    Physically Abused: No    Sexually Abused: No    Outpatient Medications Prior to Visit  Medication Sig Dispense Refill   acetaminophen  (TYLENOL ) 500 MG tablet Take 500 mg by mouth every 6 (six) hours as needed.     albuterol  (VENTOLIN  HFA) 108 (90 Base) MCG/ACT inhaler Inhale 2-4 puffs into the lungs every 4 (four) hours as needed for wheezing (or cough). 18 g 0   amLODipine  (NORVASC ) 10 MG tablet Take 10 mg by mouth daily. Per [facility] discharge 12/24/23     ascorbic acid (VITAMIN C) 250  MG CHEW Chew 250 mg by mouth daily.     carvedilol  (COREG ) 12.5 MG tablet Take 12.5 mg by mouth 2 (two) times daily with a meal.     cetirizine (ZYRTEC) 10 MG tablet Take 10 mg by mouth at bedtime.     cinacalcet  (SENSIPAR ) 60 MG tablet Take 60 mg by mouth at bedtime.     cyclobenzaprine (FLEXERIL) 10 MG tablet Take 10 mg by mouth 2 (two) times daily as needed for muscle spasms (per [facility] discharged 12/24/23).     diclofenac  Sodium (VOLTAREN ) 1 % GEL Apply 1 application  topically 4 (four) times daily as needed (hand,feet,thighs and back pain).     EPINEPHrine  0.3 mg/0.3 mL IJ SOAJ injection Inject 0.3 mg into the muscle as needed for anaphylaxis.     epoetin  alfa (EPOGEN ) 20000 UNIT/ML injection Inject 20,000 Units into the skin every 14 (fourteen) days.     ferric citrate  (AURYXIA ) 1 GM 210 MG(Fe) tablet Take 420 mg by mouth in the morning, at noon, and at bedtime.     FOSRENOL  1000 MG chewable tablet Chew 1,000 mg by mouth 3 (three) times daily with meals.     gabapentin  (NEURONTIN ) 100 MG capsule Take 100 mg by mouth at bedtime. Per [facility] discharged 12/24/23     HYDROcodone -acetaminophen  (NORCO/VICODIN) 5-325 MG tablet Take 1 tablet by mouth 4 (four) times daily as needed for pain. (Patient taking differently: Take 1 tablet by mouth 4 (four) times daily as needed (Pain management). Per [facility] discharged 12/24/23 10-325mg  Take 1 tab every 4 hours as needed up to 5 days - will discuss with pain management MD) 120 tablet 0   iron  sucrose (VENOFER ) 20 MG/ML injection Inject 1,000 mg into the vein every 30 (thirty) days.     levothyroxine  (SYNTHROID ) 200 MCG tablet Take 200 mcg by mouth at bedtime.     Vitamin D, Ergocalciferol, (DRISDOL) 1.25 MG (50000 UNIT) CAPS capsule Take 50,000 Units by mouth once a week.     acyclovir  (ZOVIRAX ) 200 MG capsule Take 200 mg by mouth 2 (two) times daily as needed (out breaks). (Patient not taking: Reported on 12/25/2023)     atomoxetine  (STRATTERA ) 10 MG capsule Take  1 capsule (10 mg total) by mouth 2 (two) times  daily with a meal. (Patient not taking: Reported on 12/08/2023) 60 capsule 2   clopidogrel (PLAVIX) 75 MG tablet Take 75 mg by mouth daily. (Patient not taking: Reported on 12/08/2023)     escitalopram  (LEXAPRO ) 10 MG tablet Take 1 tablet (10 mg total) by mouth daily. (Patient not taking: Reported on 11/16/2023) 90 tablet 0   furosemide  (LASIX ) 80 MG tablet Take 80 mg by mouth daily as needed for edema. (Patient not taking: Reported on 12/25/2023)     losartan  (COZAAR ) 100 MG tablet Take 100 mg by mouth daily. (Patient not taking: Reported on 11/16/2023)     No facility-administered medications prior to visit.    Allergies  Allergen Reactions   Ancef  [Cefazolin ] Anaphylaxis    Patient reports 12/11/23 hospitalization   Ciprofloxacin Anaphylaxis and Other (See Comments)    Caused Seizures   Seizures   Other reaction(s): Other (See Comments)  Seizures  Seizures     Caused Seizures  Seizures  Seizures  Other reaction(s): Other (See Comments) Seizures  Seizures  Seizures     Other reaction(s): Other (See Comments) Seizures  Seizures  Seizures     Caused Seizures  Caused Seizures  Seizures  Seizures  Other reaction(s): Other (See Comments) Seizures  Seizures  Seizures    Seizures Grand mal   Levetiracetam Anaphylaxis and Other (See Comments)    Medical Record H&P (unknown reaction)  Medical Record H&P  Other reaction(s): Other (See Comments)   Oxycodone -Acetaminophen  Nausea Only and Other (See Comments)   Rituximab Anaphylaxis and Swelling   Tramadol Anaphylaxis and Other (See Comments)    Caused seizures  Other reaction(s): Other (See Comments)  Medical Record H&P  Medical Record H&P    Caused seizures  Other Reaction(s): Other (See Comments)  Caused seizures  Medical Record H&P  Medical Record H&P  Other reaction(s): seizures  Medical Record H&P  Medical Record H&P  Medical Record H&P    ROS     Objective:     Physical Exam   BP (!) 130/90 (BP Location: Left Arm, Patient Position: Sitting)   Pulse 95   Temp (!) 97.3 F (36.3 C) (Temporal)   Ht 5\' 7"  (1.702 m)   Wt 213 lb (96.6 kg)   LMP 03/31/2022 (Exact Date) Comment: pt bleeding last 6 weeks with recent multiple transfusions  SpO2 95%   BMI 33.36 kg/m  Wt Readings from Last 3 Encounters:  12/25/23 213 lb (96.6 kg)  12/08/23 217 lb 2.5 oz (98.5 kg)  11/16/23 216 lb (98 kg)       Assessment & Plan:   Problem List Items Addressed This Visit     ESRD on dialysis (HCC) (Chronic)   Closed fracture of multiple ribs   Other Visit Diagnoses       Shortness of breath    -  Primary   Relevant Orders   DG Chest 2 View (Completed)     Wheezing       Relevant Orders   DG Chest 2 View (Completed)     Hospital discharge follow-up           I am having Holly [name] start on ipratropium-albuterol  and predniSONE . I am also having her maintain her carvedilol , cinacalcet , levothyroxine , acyclovir , diclofenac  Sodium, cetirizine, epoetin  alfa, Fosrenol , albuterol , escitalopram , iron  sucrose, HYDROcodone -acetaminophen , atomoxetine , losartan , Vitamin D (Ergocalciferol), clopidogrel, ferric citrate , furosemide , amLODipine , ascorbic acid, cyclobenzaprine, gabapentin , acetaminophen , and EPINEPHrine .  Meds ordered this encounter  Medications   ipratropium-albuterol  (DUONEB) 0.5-2.5 (3) MG/3ML  SOLN    Sig: Take 3 mLs by nebulization every 6 (six) hours as needed.    Dispense:  360 mL    Refill:  0    Supervising Provider:   Bambi Lever A [[id number]]   predniSONE  (DELTASONE ) 20 MG tablet    Sig: Take 2 tablets (40 mg total) by mouth daily with breakfast.    Dispense:  10 tablet    Refill:  0    Supervising Provider:   Bambi Lever A [[id number]]

## 2023-12-28 ENCOUNTER — Other Ambulatory Visit (HOSPITAL_COMMUNITY): Payer: Self-pay

## 2023-12-28 ENCOUNTER — Encounter: Payer: Self-pay | Admitting: Physician Assistant

## 2023-12-28 MED ORDER — HYDROCODONE-ACETAMINOPHEN 5-325 MG PO TABS
1.0000 | ORAL_TABLET | Freq: Four times a day (QID) | ORAL | 0 refills | Status: AC | PRN
  Filled 2023-12-28: qty 120, 30d supply, fill #0

## 2023-12-28 MED ORDER — ALBUTEROL SULFATE HFA 108 (90 BASE) MCG/ACT IN AERS: 2.0000 | INHALATION_SPRAY | RESPIRATORY_TRACT | 0 refills | Status: DC | PRN

## 2023-12-28 NOTE — Addendum Note (Signed)
 Addended by: Rozann Holts E on: 12/28/2023 01:57 PM   Modules accepted: Orders

## 2023-12-30 ENCOUNTER — Encounter (HOSPITAL_COMMUNITY): Payer: Self-pay

## 2023-12-31 ENCOUNTER — Other Ambulatory Visit: Payer: Self-pay | Admitting: *Deleted

## 2023-12-31 ENCOUNTER — Encounter: Payer: Self-pay | Admitting: Family Medicine

## 2023-12-31 NOTE — Transitions of Care (Post Inpatient/ED Visit) (Signed)
 Transition of Care week 2/ day # 6  Visit Note  12/31/2023  Name: Holly [name] MRN: [id number]          DOB: 12-18-1985  Situation: Patient enrolled in [facility] 30-day program. Visit completed with patient by telephone. Patient's boyfriend/ significant other "Elena Grieves" participates in [facility] call today per patient request: she provides verbal consent for me to speak with her significant other "at any time"  Background:  2 recent hospital admissions: 4/14-15, 2025: for symptomatic anemia secondary to hemodialysis at home [facility]) 4/18-23, 2025: for presumed anaphylaxis from Ancef , requiring prolonged CPR/ intubation/ life support during planned procedure admission for HD access  Initial Transition Care Management Follow-up Telephone Call    Past Medical History:  Diagnosis Date   Anemia    Anxiety    Arthritis    hands   Asthmatic bronchitis 02/11/2023   Blood transfusion without reported diagnosis    Cancer (HCC)    Complication of anesthesia    with Fentanyl  she is difficult to awake   Depression    Dyspnea    due to anemia   Epilepsy (HCC)    ESRD on hemodialysis (HCC)    home HD 3 hrs, 5 days per wk   Hashimoto's disease    History of kidney stones    HLD (hyperlipidemia)    Hypertension    Hypothyroidism    Membranoproliferative glomerulonephritis type 2, idiopathic    Pneumonia    Thyroid  disease    Assessment:  "I am just still in so much pain and discomfort from the CPR- my chest wall, my sternum; I called my pain management doctor at [facility], he says he can't get me any more pain medication until our appointment on 01/12/24-- I am going to reach out to my other doctors to see if there is anyway they can bridge me with something until the 20th of May.  The pain is the worst thing I am going through right now; it makes me anxious, which makes everything worse.  Still doing hemodialysis at home, been doing it for several years and it is going fine."     Denies clinical concerns and sounds to be in no distress throughout [facility] 30-day program outreach call today  Patient Reported Symptoms: Cognitive Cognitive Status: Alert and oriented to person, place, and time, Insightful and able to interpret abstract concepts, Normal speech and language skills Cognitive/Intellectual Conditions Management [RPT]: None reported or documented in medical history or problem list   Health Maintenance Behaviors: Annual physical exam  Neurological Neurological Review of Symptoms: No symptoms reported    HEENT HEENT Symptoms Reported: No symptoms reported      Cardiovascular Cardiovascular Symptoms Reported: Chest pain or discomfort (MSK pain due to recent CPR- rib/ sternal fractures) Does patient have uncontrolled Hypertension?: No Cardiovascular Conditions: Hypertension Cardiovascular Management Strategies: Medication therapy, Routine screening, Adequate rest, Coping strategies  Respiratory Respiratory Symptoms Reported: No symptoms reported Other Respiratory Symptoms: Denies shortness of breath during TOC call today; no respiratory distress noted; reports intermittent shortness of breath due to ongoing post-CPR- reports using nebulizer BID- reports "helps" Respiratory Conditions: Shortness of breath  Endocrine Patient reports the following symptoms related to hypoglycemia or hyperglycemia : No symptoms reported Is patient diabetic?: No Endocrine Conditions: Thyroid  disorder Endocrine Management Strategies: Medication therapy  Gastrointestinal Gastrointestinal Symptoms Reported: No symptoms reported Additional Gastrointestinal Details: Reports "normal" BM's post hospital discharge      Genitourinary Genitourinary Symptoms Reported: Other Other Genitourinary Symptoms: continues  at home hemodialysis- reports last hemodialysis was "yesterday;" continues making only a small amount of urine outside of dialysis; denies issues around dialysis- confirms has all  equipment she needs at home; followed by [facility] provider for renal care Genitourinary Conditions: End-stage renal disease Genitourinary Management Strategies: Hemodialysis Hemodialysis Schedule: in home hemodialysis- 5 x per week- at patient's discretion Hemodialysis Last Treatment: 12/30/23  Integumentary Integumentary Symptoms Reported: No symptoms reported    Musculoskeletal Musculoskelatal Symptoms Reviewed: No symptoms reported        Psychosocial Psychosocial Symptoms Reported: Anxiety - if selected complete GAD Additional Psychological Details: post- recent CPR anxiety: confirmed she is established with and regularly visits Behavioral Health psychiatry and counselor providers Behavioral Health Conditions: Anxiety Behavioral Management Strategies: Counseling, Support system, Medication therapy, Adequate rest Major Change/Loss/Stressor/Fears (CP): Medical condition, self Techniques to Cope with Loss/Stress/Change: Diversional activities, Medication Quality of Family Relationships: helpful Do you feel physically threatened by others?: No   Vitals:   12/31/23 1621  BP: 118/84    Medications Reviewed Today     Reviewed by Treavor Blomquist M, RN (Registered Nurse) on 12/31/23 at 1500  Med List Status: <None>   Medication Order Taking? Sig Documenting Provider Last Dose Status Informant  acetaminophen  (TYLENOL ) 500 MG tablet [id number] No Take 500 mg by mouth every 6 (six) hours as needed. [provider] Taking Active   acyclovir  (ZOVIRAX ) 200 MG capsule [id number] No Take 200 mg by mouth 2 (two) times daily as needed (out breaks).  Patient not taking: Reported on 12/25/2023   [provider] Not Taking Active Self, Pharmacy Records  albuterol  (VENTOLIN  HFA) 108 (90 Base) MCG/ACT inhaler [id number]  Inhale 2-4 puffs into the lungs every 4 (four) hours as needed for wheezing (or cough). [name], [name], NP-C  Active   amLODipine  (NORVASC ) 10 MG tablet [id number] No Take 10  mg by mouth daily. Per [facility] discharge 12/24/23 [provider] Taking Active   ascorbic acid (VITAMIN C) 250 MG CHEW [id number] No Chew 250 mg by mouth daily. [provider] Taking Active   atomoxetine  (STRATTERA ) 10 MG capsule [id number] No Take 1 capsule (10 mg total) by mouth 2 (two) times daily with a meal.  Patient not taking: Reported on 12/08/2023   Arturo Late, MD Not Taking Active Self, Pharmacy Records           Med Note Lafayette Pierre   Fri Sep 25, 2023  3:39 PM) On hold  carvedilol  (COREG ) 12.5 MG tablet [id number] No Take 12.5 mg by mouth 2 (two) times daily with a meal. [provider] Taking Active Self, Pharmacy Records           Med Note Neila Bally, HEATHER L   Fri Nov 08, 2021 10:25 AM)    cetirizine (ZYRTEC) 10 MG tablet [id number] No Take 10 mg by mouth at bedtime. [provider] Taking Active Self, Pharmacy Records  cinacalcet  (SENSIPAR ) 60 MG tablet [id number] No Take 60 mg by mouth at bedtime. [provider] Taking Active Self, Pharmacy Records           Med Note Lafayette Pierre   Fri Sep 25, 2023  3:36 PM) Filled with [facility] [place]  clopidogrel (PLAVIX) 75 MG tablet [id number] No Take 75 mg by mouth daily.  Patient not taking: Reported on 12/08/2023   [provider] Not Taking Active Self, Pharmacy Records  cyclobenzaprine (FLEXERIL) 10 MG tablet [id number] No Take 10 mg by mouth 2 (two) times daily as needed for muscle  spasms (per [facility] discharged 12/24/23). [provider] Taking Active   diclofenac  Sodium (VOLTAREN ) 1 % GEL [id number] No Apply 1 application  topically 4 (four) times daily as needed (hand,feet,thighs and back pain). [provider] Taking Active Self, Pharmacy Records           Med Note Lafayette Pierre   Fri Sep 25, 2023  3:40 PM)    EPINEPHrine  0.3 mg/0.3 mL IJ SOAJ injection [id number] No Inject 0.3 mg into the muscle as needed for anaphylaxis. [provider] Taking Active    epoetin  alfa (EPOGEN ) 20000 UNIT/ML injection [id number] No Inject 20,000 Units into the skin every 14 (fourteen) days. [provider] Taking Active Self, Pharmacy Records  escitalopram  (LEXAPRO ) 10 MG tablet [id number] No Take 1 tablet (10 mg total) by mouth daily.  Patient not taking: Reported on 11/16/2023   Arfeen, Syed T, MD Not Taking Active Self, Pharmacy Records  ferric citrate  (AURYXIA ) 1 GM 210 MG(Fe) tablet [id number] No Take 420 mg by mouth in the morning, at noon, and at bedtime. [provider] Taking Active Self, Pharmacy Records  FOSRENOL  1000 MG chewable tablet [id number] No Chew 1,000 mg by mouth 3 (three) times daily with meals. [provider] Taking Active Self, Pharmacy Records  furosemide  (LASIX ) 80 MG tablet [id number] No Take 80 mg by mouth daily as needed for edema.  Patient not taking: Reported on 12/25/2023   [provider] Not Taking Active Self, Pharmacy Records  gabapentin  (NEURONTIN ) 100 MG capsule [id number] No Take 100 mg by mouth at bedtime. Per [facility] discharged 12/24/23 [provider] Taking Active   HYDROcodone -acetaminophen  (NORCO/VICODIN) 5-325 MG tablet [id number]  Take 1 tablet by mouth 4 (four) times daily as needed for pain.   Active   ipratropium-albuterol  (DUONEB) 0.5-2.5 (3) MG/3ML SOLN [id number]  Take 3 mLs by nebulization every 6 (six) hours as needed. [name], [name], NP-C  Active   iron  sucrose (VENOFER ) 20 MG/ML injection [id number] No Inject 1,000 mg into the vein every 30 (thirty) days. [provider] Taking Active Self, Pharmacy Records  levothyroxine  (SYNTHROID ) 200 MCG tablet [id number] No Take 200 mcg by mouth at bedtime. [provider] Taking Active Self, Pharmacy Records  losartan  (COZAAR ) 100 MG tablet [id number] No Take 100 mg by mouth daily.  Patient not taking: Reported on 11/16/2023   [provider] Not Taking Active Self, Pharmacy Records           Med Note Lafayette Pierre   Fri Sep 25, 2023  3:36 PM) On hold  predniSONE  (DELTASONE ) 20 MG tablet [id number]  Take 2 tablets (40 mg total) by mouth daily with breakfast. Maree Shames, [name], NP-C  Active   Vitamin D, Ergocalciferol, (DRISDOL) 1.25 MG (50000 UNIT) CAPS capsule [id number] No Take 50,000 Units by mouth once a week. [provider] Taking Active Self, Pharmacy Records           Recommendation:   Specialty provider follow-up as scheduled  Follow Up Plan:   Telephone follow-up in 1 week- as scheduled 01/08/24  Plan for next week's call: Review medication adherence- note any changes to medications post- upcoming provider office visits Review provider office visits/ labs/ medication changes from 01/04/24 (asthma allergy) and 01/07/24 (vascular- for HD access) provider office visits at [facility] Review upcoming provider office visits: 01/12/24- pain management provider Re-assess pain/ pain management- anxiety from recent prolonged CPR Review hemodialysis at home and home blood pressure values  Pls call/ message for questions,  Winna Golla Mckinney Jamon [place], RN, BSN, Media planner  Transitions of Care  [facility] [phone]: direct office

## 2024-01-02 ENCOUNTER — Emergency Department (HOSPITAL_COMMUNITY)

## 2024-01-02 ENCOUNTER — Other Ambulatory Visit: Payer: Self-pay

## 2024-01-02 ENCOUNTER — Emergency Department (HOSPITAL_COMMUNITY)
Admission: EM | Admit: 2024-01-02 | Discharge: 2024-01-03 | Disposition: A | Discharge status: Home / Self Care | Attending: Emergency Medicine | Admitting: Emergency Medicine

## 2024-01-02 ENCOUNTER — Encounter (HOSPITAL_COMMUNITY): Payer: Self-pay

## 2024-01-02 DIAGNOSIS — R0602 Shortness of breath: Secondary | ICD-10-CM | POA: Insufficient documentation

## 2024-01-02 DIAGNOSIS — R0789 Other chest pain: Secondary | ICD-10-CM | POA: Insufficient documentation

## 2024-01-02 LAB — CBC WITH DIFFERENTIAL/PLATELET
Abs Immature Granulocytes: 0.07 10*3/uL (ref 0.00–0.07)
Basophils Absolute: 0 10*3/uL (ref 0.0–0.1)
Basophils Relative: 0 %
Eosinophils Absolute: 0.5 10*3/uL (ref 0.0–0.5)
Eosinophils Relative: 6 %
HCT: 23.6 % — ABNORMAL LOW (ref 36.0–46.0)
Hemoglobin: 7.6 g/dL — ABNORMAL LOW (ref 12.0–15.0)
Immature Granulocytes: 1 %
Lymphocytes Relative: 11 %
Lymphs Abs: 0.9 10*3/uL (ref 0.7–4.0)
MCH: 28.7 pg (ref 26.0–34.0)
MCHC: 32.2 g/dL (ref 30.0–36.0)
MCV: 89.1 fL (ref 80.0–100.0)
Monocytes Absolute: 0.5 10*3/uL (ref 0.1–1.0)
Monocytes Relative: 6 %
Neutro Abs: 6 10*3/uL (ref 1.7–7.7)
Neutrophils Relative %: 76 %
Platelets: 201 10*3/uL (ref 150–400)
RBC: 2.65 MIL/uL — ABNORMAL LOW (ref 3.87–5.11)
RDW: 15.4 % (ref 11.5–15.5)
WBC: 7.9 10*3/uL (ref 4.0–10.5)
nRBC: 0 % (ref 0.0–0.2)

## 2024-01-02 LAB — BASIC METABOLIC PANEL WITH GFR
Anion gap: 15 (ref 5–15)
BUN: 68 mg/dL — ABNORMAL HIGH (ref 6–20)
CO2: 21 mmol/L — ABNORMAL LOW (ref 22–32)
Calcium: 8.7 mg/dL — ABNORMAL LOW (ref 8.9–10.3)
Chloride: 91 mmol/L — ABNORMAL LOW (ref 98–111)
Creatinine, Ser: 9.07 mg/dL — ABNORMAL HIGH (ref 0.44–1.00)
GFR, Estimated: 5 mL/min — ABNORMAL LOW (ref >60)
Glucose, Bld: 122 mg/dL — ABNORMAL HIGH (ref 70–99)
Potassium: 5.4 mmol/L — ABNORMAL HIGH (ref 3.5–5.1)
Sodium: 127 mmol/L — ABNORMAL LOW (ref 135–145)

## 2024-01-02 LAB — BRAIN NATRIURETIC PEPTIDE: B Natriuretic Peptide: 158.3 pg/mL — ABNORMAL HIGH (ref 0.0–100.0)

## 2024-01-02 LAB — TROPONIN I (HIGH SENSITIVITY): Troponin I (High Sensitivity): 4 ng/L (ref <18)

## 2024-01-02 MED ORDER — HYDROMORPHONE HCL 1 MG/ML IJ SOLN
1.0000 mg | Freq: Once | INTRAMUSCULAR | Status: AC
  Administered 2024-01-02: 1 mg via INTRAVENOUS
  Filled 2024-01-02: qty 1

## 2024-01-02 MED ORDER — ONDANSETRON HCL 4 MG/2ML IJ SOLN
4.0000 mg | Freq: Once | INTRAMUSCULAR | Status: AC
  Administered 2024-01-02: 4 mg via INTRAVENOUS
  Filled 2024-01-02: qty 2

## 2024-01-02 MED ORDER — IOHEXOL 350 MG/ML SOLN
75.0000 mL | Freq: Once | INTRAVENOUS | Status: AC | PRN
  Administered 2024-01-02: 75 mL via INTRAVENOUS

## 2024-01-02 NOTE — ED Triage Notes (Signed)
 Pt to er, pt states that she was scheduled to have a fistula placed on April 18th, states that she was given ancef  and went into anaphylaxis and coded. States that she was coded for about 20 minutes.  States that she was in [facility] ICU for about two weeks.  States that she is here today for chest pain and sob, states that she has been having the pain for the past couple of days.  States that she has a pain management doc, but can't get into see him.  Pt states that her ribs "click" when she takes a deep breath.  States that when they did cpr they broke 8 ribs.

## 2024-01-02 NOTE — ED Provider Notes (Addendum)
 Holly [name] EMERGENCY DEPARTMENT AT [facility] Provider Note   CSN: [id number] Arrival date & time: 01/02/24  1655     History  Chief Complaint  Patient presents with   Chest Pain    Holly [name] is a 38 y.o. female.  Patient with a complaint of shortness of breath for couple days.  Patient with complaint of some chest pain now for several weeks.  Patient was admitted at [facility] April 23 through May 1.  During that admission she had an anaphylactic reaction and coded and got CPR and got rib fractures related to that.  Otherwise past medical history significant for anemia hypertension membranous glomera nephritis end-stage renal disease does home dialysis daily.  Oxygen sats here on room air 100% which is reassuring blood pressure was elevated 176/92 respiratory rate 18 pulse 84 and temp was 98.1.  Patient also has a history of chronic pain and is followed by pain management.  Patient currently taking hydrocodone  for the chest rib pain.       Home Medications Prior to Admission medications   Medication Sig Start Date End Date Taking? Authorizing Provider  acetaminophen  (TYLENOL ) 500 MG tablet Take 500 mg by mouth every 6 (six) hours as needed.    [provider]  acyclovir  (ZOVIRAX ) 200 MG capsule Take 200 mg by mouth 2 (two) times daily as needed (out breaks). Patient not taking: Reported on 12/25/2023 08/14/21   [provider]  albuterol  (VENTOLIN  HFA) 108 (90 Base) MCG/ACT inhaler Inhale 2-4 puffs into the lungs every 4 (four) hours as needed for wheezing (or cough). 12/28/23   [name], [name], NP-C  amLODipine  (NORVASC ) 10 MG tablet Take 10 mg by mouth daily. Per [facility] discharge 12/24/23    [provider]  ascorbic acid (VITAMIN C) 250 MG CHEW Chew 250 mg by mouth daily.    [provider]  atomoxetine  (STRATTERA ) 10 MG capsule Take 1 capsule (10 mg total) by mouth 2 (two) times daily with a meal. Patient not taking:  Reported on 12/08/2023 09/15/23   Arfeen, Bronson Canny, MD  carvedilol  (COREG ) 12.5 MG tablet Take 12.5 mg by mouth 2 (two) times daily with a meal.    [provider]  cetirizine (ZYRTEC) 10 MG tablet Take 10 mg by mouth at bedtime.    [provider]  cinacalcet  (SENSIPAR ) 60 MG tablet Take 60 mg by mouth at bedtime.    [provider]  clopidogrel (PLAVIX) 75 MG tablet Take 75 mg by mouth daily. Patient not taking: Reported on 12/08/2023 10/21/23 10/20/24  [provider]  cyclobenzaprine (FLEXERIL) 10 MG tablet Take 10 mg by mouth 2 (two) times daily as needed for muscle spasms (per [facility] discharged 12/24/23).    [provider]  diclofenac  Sodium (VOLTAREN ) 1 % GEL Apply 1 application  topically 4 (four) times daily as needed (hand,feet,thighs and back pain).    [provider]  EPINEPHrine  0.3 mg/0.3 mL IJ SOAJ injection Inject 0.3 mg into the muscle as needed for anaphylaxis.    [provider]  epoetin  alfa (EPOGEN ) 20000 UNIT/ML injection Inject 20,000 Units into the skin every 14 (fourteen) days.    [provider]  escitalopram  (LEXAPRO ) 10 MG tablet Take 1 tablet (10 mg total) by mouth daily. Patient not taking: Reported on 11/16/2023 02/09/23   Arfeen, Bronson Canny, MD  ferric citrate  (AURYXIA ) 1 GM 210 MG(Fe) tablet Take 420 mg by mouth in the morning, at noon, and at  bedtime.    [provider]  FOSRENOL  1000 MG chewable tablet Chew 1,000 mg by mouth 3 (three) times daily with meals.    [provider]  furosemide  (LASIX ) 80 MG tablet Take 80 mg by mouth daily as needed for edema. Patient not taking: Reported on 12/25/2023 11/13/21   [provider]  gabapentin  (NEURONTIN ) 100 MG capsule Take 100 mg by mouth at bedtime. Per [facility] discharged 12/24/23    [provider]  HYDROcodone -acetaminophen  (NORCO/VICODIN) 5-325 MG tablet Take 1 tablet by mouth 4 (four) times daily as needed for pain. 12/28/23      ipratropium-albuterol  (DUONEB) 0.5-2.5 (3) MG/3ML SOLN Take 3 mLs by nebulization every 6 (six) hours as needed. 12/25/23   [name], [name], NP-C  iron  sucrose (VENOFER ) 20 MG/ML injection Inject 1,000 mg into the vein every 30 (thirty) days.    [provider]  levothyroxine  (SYNTHROID ) 200 MCG tablet Take 200 mcg by mouth at bedtime.    [provider]  losartan  (COZAAR ) 100 MG tablet Take 100 mg by mouth daily. Patient not taking: Reported on 11/16/2023    [provider]  predniSONE  (DELTASONE ) 20 MG tablet Take 2 tablets (40 mg total) by mouth daily with breakfast. 12/25/23   [name], [name], NP-C  Vitamin D, Ergocalciferol, (DRISDOL) 1.25 MG (50000 UNIT) CAPS capsule Take 50,000 Units by mouth once a week.    [provider]      Allergies    Ancef  [cefazolin ], Ciprofloxacin, Levetiracetam, Oxycodone -acetaminophen , Rituximab, and Tramadol    Review of Systems   Review of Systems  Constitutional:  Negative for chills and fever.  HENT:  Negative for ear pain and sore throat.   Eyes:  Negative for pain and visual disturbance.  Respiratory:  Positive for shortness of breath. Negative for cough.   Cardiovascular:  Positive for chest pain. Negative for palpitations.  Gastrointestinal:  Negative for abdominal pain and vomiting.  Genitourinary:  Negative for dysuria and hematuria.  Musculoskeletal:  Negative for arthralgias and back pain.  Skin:  Negative for color change and rash.  Neurological:  Negative for seizures and syncope.  All other systems reviewed and are negative.   Physical Exam Updated Vital Signs BP (!) 176/92 (BP Location: Right Arm)   Pulse 84   Temp 98.1 F (36.7 C) (Oral)   Resp 18   Ht 1.702 m (5\' 7" )   Wt 96.6 kg   LMP 03/31/2022 (Exact Date) Comment: pt bleeding last 6 weeks with recent multiple transfusions  SpO2 100%   BMI 33.36 kg/m  Physical Exam Vitals and nursing note reviewed.  Constitutional:      General: She  is not in acute distress.    Appearance: Normal appearance. She is well-developed.  HENT:     Head: Normocephalic and atraumatic.  Eyes:     Conjunctiva/sclera: Conjunctivae normal.  Cardiovascular:     Rate and Rhythm: Normal rate and regular rhythm.     Heart sounds: No murmur heard. Pulmonary:     Effort: Pulmonary effort is normal. No respiratory distress.     Breath sounds: Normal breath sounds. No stridor. No wheezing, rhonchi or rales.  Chest:     Chest wall: Tenderness present.  Abdominal:     Palpations: Abdomen is soft.     Tenderness: There is no abdominal tenderness.  Musculoskeletal:        General: No swelling.     Cervical back: Neck supple.  Skin:    General: Skin  is warm and dry.     Capillary Refill: Capillary refill takes less than 2 seconds.  Neurological:     General: No focal deficit present.     Mental Status: She is alert and oriented to person, place, and time.  Psychiatric:        Mood and Affect: Mood normal.     ED Results / Procedures / Treatments   Labs (all labs ordered are listed, but only abnormal results are displayed) Labs Reviewed  CBC WITH DIFFERENTIAL/PLATELET  BASIC METABOLIC PANEL WITH GFR  TROPONIN I (HIGH SENSITIVITY)    EKG EKG Interpretation Date/Time:  Saturday Jan 02 2024 17:08:18 EDT Ventricular Rate:  76 PR Interval:  150 QRS Duration:  112 QT Interval:  385 QTC Calculation: 433 R Axis:   69  Text Interpretation: Sinus rhythm Incomplete right bundle branch block No significant change since last tracing Confirmed by Ermias Tomeo [phone]) on 01/02/2024 5:10:54 PM  Radiology No results found.  Procedures Procedures    Medications Ordered in ED Medications  HYDROmorphone  (DILAUDID ) injection 1 mg (has no administration in time range)  ondansetron  (ZOFRAN ) injection 4 mg (has no administration in time range)    ED Course/ Medical Decision Making/ A&P                                 Medical Decision  Making Amount and/or Complexity of Data Reviewed Labs: ordered. Radiology: ordered.  Risk Prescription drug management.   Patient will need troponins.  Will get CBC basic metabolic panel.  Will get chest x-ray.  KG showed evidence of an incomplete right bundle branch block.  No significant change compared to old ones.  Patient's oxygen saturation being 100% is very reassuring.  Will supplement some of the pain medication here with some IV hydromorphone  and Zofran .  Patient still feeling short of breath.  CBC white count 7.9 hemoglobin 7.6 platelets 201.  Hemoglobin patient is on chronic renal dialysis.  Is actually a little bit higher than where her hemoglobins have been.  Basic metabolic panel significant for sodium of 127 potassium up a little bit at 5.4.  Glucose 122.  Patient does do home hemodialysis every day of the week.  Based on a little bit of a potassium being up some would recommend that maybe her managers from nephrology recheck her labs in about a week.  She is normally is checked a couple times a month.  Initial troponin is reassuring at 4 BNP up a little bit at 158 chest x-ray raises concerns about mild left suprahilar left perihilar and left basilar atelectasis and/or infiltrate.  Small left pleural effusion.  Patient's oxygen sats here are very good in 100 but she still does feel short of breath but also she has had the history of the rib fractures.  Patient states that she has been on hemodialysis now for 10 years.  She is willing to do the CT angio with dye to delineate whether were dealing with pneumonia or whether there is may be evidence of a PE prickly in the face of the multiple rib fractures.  In addition patient's delta troponin is pending and we need to know that as well.   Patient with some increased pain in the chest.  Will reorder the hydromorphone  and Zofran .  Patient will not be able to go home with any oral pain medication because she is followed by pain  management.  She does  have hydrocodone  available at home to take.    With the potassium being 5.4.  Based on our hyperkalemic protocol does not meet threshold for any intervention.  As mentioned before recommend close follow-up with repeat potassium later this week.  Delta troponin still pending.  CT angio chest pending.   Final Clinical Impression(s) / ED Diagnoses Final diagnoses:  Shortness of breath  Atypical chest pain  Chest wall pain    Rx / DC Orders ED Discharge Orders     None         Nicklas Barns, MD 01/02/24 Staci Dykes    Nicklas Barns, MD 01/02/24 2138    Nicklas Barns, MD 01/02/24 [id number]    Nicklas Barns, MD 01/02/24 1191    Nicklas Barns, MD 01/02/24 2231    Nicklas Barns, MD 01/02/24 2255

## 2024-01-02 NOTE — Discharge Instructions (Addendum)
 Take your pain medication that you have at home.  Follow-up with pain management.   Your sodium was a bit low today.  You should get repeat lab work done within the next few days to reassess levels.  Return to the emergency department for any new or worsening symptoms of concern.

## 2024-01-02 NOTE — ED Provider Notes (Signed)
 Care of patient assumed from Dr. Zackowski.  Patient presenting for chest pain and shortness of breath.  Had a recent admission at [facility] where she had a cardiac arrest secondary to anaphylaxis.  She did receive chest compressions at the time.  This may be the cause of her chest pain.  She is awaiting delta troponin and CTA chest.  She does hemodialysis at home every day.  She is managed by pain management doctor.  She does have narcotic pain medication at home. Physical Exam  BP 136/84   Pulse 82   Temp 98 F (36.7 C) (Oral)   Resp 13   Ht 5\' 7"  (1.702 m)   Wt 96.6 kg   LMP 03/31/2022 (Exact Date) Comment: pt bleeding last 6 weeks with recent multiple transfusions  SpO2 99%   BMI 33.36 kg/m   Physical Exam Vitals and nursing note reviewed.  Constitutional:      General: She is not in acute distress.    Appearance: She is well-developed. She is not ill-appearing, toxic-appearing or diaphoretic.  HENT:     Head: Normocephalic and atraumatic.  Eyes:     Conjunctiva/sclera: Conjunctivae normal.  Cardiovascular:     Rate and Rhythm: Normal rate and regular rhythm.  Pulmonary:     Effort: Pulmonary effort is normal. No respiratory distress.  Musculoskeletal:        General: No swelling. Normal range of motion.     Cervical back: Normal range of motion and neck supple.  Skin:    General: Skin is warm and dry.     Coloration: Skin is not cyanotic or pale.  Neurological:     General: No focal deficit present.     Mental Status: She is alert and oriented to person, place, and time.  Psychiatric:        Mood and Affect: Mood normal.        Behavior: Behavior normal.     Procedures  Procedures  ED Course / MDM    Medical Decision Making Amount and/or Complexity of Data Reviewed Labs: ordered. Radiology: ordered.  Risk Prescription drug management.   CTA showed rib fractures in addition to possible sternal fracture, consistent with her recent CPR.  Patient  states that she received 28 minutes of CPR when she had an anaphylactic reaction in the OR at [facility].  Notably, there were no focal consolidations on CTA.  I do not feel the patient needs antibiotics.  On assessment, vital signs, including SpO2 on room air is normal.  Robaxin and Norco was ordered for ongoing analgesia.  Patient did request an albuterol  inhaler.  She does not want to get admitted.  She will speak to her pain management clinic on Monday.  Patient was discharged in stable condition.       Iva Mariner, MD 01/03/24 [phone]

## 2024-01-02 NOTE — ED Notes (Signed)
 Patient transported to CT

## 2024-01-03 DIAGNOSIS — R0602 Shortness of breath: Secondary | ICD-10-CM | POA: Diagnosis not present

## 2024-01-03 LAB — TROPONIN I (HIGH SENSITIVITY): Troponin I (High Sensitivity): 4 ng/L (ref <18)

## 2024-01-03 MED ORDER — LIDOCAINE 5 % EX PTCH
1.0000 | MEDICATED_PATCH | CUTANEOUS | Status: DC
  Administered 2024-01-03: 1 via TRANSDERMAL
  Filled 2024-01-03: qty 1

## 2024-01-03 MED ORDER — HYDROMORPHONE HCL 2 MG PO TABS: 2.0000 mg | ORAL_TABLET | Freq: Once | ORAL | Status: DC

## 2024-01-03 MED ORDER — ALBUTEROL SULFATE HFA 108 (90 BASE) MCG/ACT IN AERS
2.0000 | INHALATION_SPRAY | Freq: Once | RESPIRATORY_TRACT | Status: AC
  Administered 2024-01-03: 2 via RESPIRATORY_TRACT
  Filled 2024-01-03: qty 6.7

## 2024-01-03 MED ORDER — METHOCARBAMOL 500 MG PO TABS
500.0000 mg | ORAL_TABLET | Freq: Once | ORAL | Status: AC
  Administered 2024-01-03: 500 mg via ORAL
  Filled 2024-01-03: qty 1

## 2024-01-03 MED ORDER — HYDROCODONE-ACETAMINOPHEN 5-325 MG PO TABS
1.0000 | ORAL_TABLET | Freq: Once | ORAL | Status: AC
  Administered 2024-01-03: 1 via ORAL
  Filled 2024-01-03: qty 1

## 2024-01-06 ENCOUNTER — Other Ambulatory Visit: Payer: Self-pay | Admitting: Family Medicine

## 2024-01-06 ENCOUNTER — Other Ambulatory Visit (HOSPITAL_COMMUNITY): Payer: Self-pay

## 2024-01-06 MED ORDER — TIZANIDINE HCL 4 MG PO TABS
2.0000 mg | ORAL_TABLET | Freq: Three times a day (TID) | ORAL | 0 refills | Status: AC | PRN
  Filled 2024-01-06: qty 45, 30d supply, fill #0

## 2024-01-06 MED ORDER — OXYCODONE-ACETAMINOPHEN 7.5-325 MG PO TABS
1.0000 | ORAL_TABLET | ORAL | 0 refills | Status: AC | PRN
  Filled 2024-01-06: qty 90, 15d supply, fill #0

## 2024-01-07 ENCOUNTER — Other Ambulatory Visit: Payer: Self-pay | Admitting: Family Medicine

## 2024-01-08 ENCOUNTER — Ambulatory Visit: Admitting: Family Medicine

## 2024-01-08 ENCOUNTER — Other Ambulatory Visit: Payer: Self-pay | Admitting: *Deleted

## 2024-01-08 ENCOUNTER — Telehealth: Payer: Self-pay

## 2024-01-08 ENCOUNTER — Ambulatory Visit: Payer: Self-pay | Admitting: Family Medicine

## 2024-01-08 ENCOUNTER — Encounter: Payer: Self-pay | Admitting: Family Medicine

## 2024-01-08 VITALS — BP 126/84 | HR 94 | Temp 97.9°F | Ht 67.0 in | Wt 209.0 lb

## 2024-01-08 DIAGNOSIS — D638 Anemia in other chronic diseases classified elsewhere: Secondary | ICD-10-CM | POA: Diagnosis not present

## 2024-01-08 DIAGNOSIS — S2249XA Multiple fractures of ribs, unspecified side, initial encounter for closed fracture: Secondary | ICD-10-CM | POA: Diagnosis not present

## 2024-01-08 DIAGNOSIS — E875 Hyperkalemia: Secondary | ICD-10-CM | POA: Diagnosis not present

## 2024-01-08 DIAGNOSIS — G894 Chronic pain syndrome: Secondary | ICD-10-CM

## 2024-01-08 DIAGNOSIS — Z992 Dependence on renal dialysis: Secondary | ICD-10-CM

## 2024-01-08 DIAGNOSIS — N186 End stage renal disease: Secondary | ICD-10-CM | POA: Diagnosis not present

## 2024-01-08 DIAGNOSIS — F32A Depression, unspecified: Secondary | ICD-10-CM

## 2024-01-08 DIAGNOSIS — F419 Anxiety disorder, unspecified: Secondary | ICD-10-CM | POA: Diagnosis not present

## 2024-01-08 DIAGNOSIS — R0602 Shortness of breath: Secondary | ICD-10-CM | POA: Diagnosis not present

## 2024-01-08 LAB — CBC WITH DIFFERENTIAL/PLATELET
Basophils Absolute: 0.1 10*3/uL (ref 0.0–0.1)
Basophils Relative: 0.7 % (ref 0.0–3.0)
Eosinophils Absolute: 0.5 10*3/uL (ref 0.0–0.7)
Eosinophils Relative: 6.5 % — ABNORMAL HIGH (ref 0.0–5.0)
HCT: 25.9 % — ABNORMAL LOW (ref 36.0–46.0)
Hemoglobin: 8.6 g/dL — ABNORMAL LOW (ref 12.0–15.0)
Lymphocytes Relative: 13.1 % (ref 12.0–46.0)
Lymphs Abs: 0.9 10*3/uL (ref 0.7–4.0)
MCHC: 33.2 g/dL (ref 30.0–36.0)
MCV: 86.5 fl (ref 78.0–100.0)
Monocytes Absolute: 0.5 10*3/uL (ref 0.1–1.0)
Monocytes Relative: 7 % (ref 3.0–12.0)
Neutro Abs: 5.2 10*3/uL (ref 1.4–7.7)
Neutrophils Relative %: 72.7 % (ref 43.0–77.0)
Platelets: 282 10*3/uL (ref 150.0–400.0)
RBC: 3 Mil/uL — ABNORMAL LOW (ref 3.87–5.11)
RDW: 16.7 % — ABNORMAL HIGH (ref 11.5–15.5)
WBC: 7.1 10*3/uL (ref 4.0–10.5)

## 2024-01-08 LAB — BASIC METABOLIC PANEL WITH GFR
BUN: 46 mg/dL — ABNORMAL HIGH (ref 6–23)
CO2: 27 meq/L (ref 19–32)
Calcium: 8.3 mg/dL — ABNORMAL LOW (ref 8.4–10.5)
Chloride: 91 meq/L — ABNORMAL LOW (ref 96–112)
Creatinine, Ser: 10.71 mg/dL (ref 0.40–1.20)
GFR: 4.23 mL/min — CL (ref >60.00)
Glucose, Bld: 94 mg/dL (ref 70–99)
Potassium: 5.5 meq/L — ABNORMAL HIGH (ref 3.5–5.1)
Sodium: 132 meq/L — ABNORMAL LOW (ref 135–145)

## 2024-01-08 NOTE — Transitions of Care (Post Inpatient/ED Visit) (Signed)
 Transition of Care week 3/ day # 14  Visit Note  01/08/2024  Name: Holly [name] MRN: [id number]          DOB: 1985-11-09  Situation: Patient enrolled in [facility] 30-day program. Visit completed with patient by telephone.   HIPAA identifiers x 2 verified  Background:  2 recent hospital admissions: 4/14-15, 2025: for symptomatic anemia secondary to hemodialysis at home [facility]) 4/18-23, 2025: for presumed anaphylaxis from [facility]ce[facility] , requiring prolonged CPR/ intubation/ life support during planned procedure admission for HD access- resulting in 10 broken ribs and fractured sternum  Initial Transition Care Management Follow-up Telephone Call    Past Medical History:  Diagnosis Date   Anemia    Anxiety    Arthritis    hands   Asthmatic bronchitis 02/11/2023   Blood transfusion without reported diagnosis    Cancer (HCC)    Complication of anesthesia    with Fentanyl  she is difficult to awake   Depression    Dyspnea    due to anemia   Epilepsy (HCC)    ESRD on hemodialysis (HCC)    home HD 3 hrs, 5 days per wk   Hashimoto's disease    History of kidney stones    HLD (hyperlipidemia)    Hypertension    Hypothyroidism    Membranoproliferative glomerulonephritis type 2, idiopathic    Pneumonia    Thyroid  disease    Assessment:  "I am doing better- saw the pain management doctor and thankfully he increased the pain medication dosage; it is helping.  I am currently at my PCP office; in the lab now getting blood work drawn.  Things are better than they were last week.  Still managing hemodialysis at home.  Call me next week and we will catch up better then"    Denies clinical concerns and sounds to be in no distress throughout [facility] 30-day program outreach call today  Patient Reported Symptoms: Cognitive Cognitive Status: Alert and oriented to person, place, and time, Insightful and able to interpret abstract concepts, Normal speech and language  skills Cognitive/Intellectual Conditions Management [RPT]: None reported or documented in medical history or problem list   Health Maintenance Behaviors: Annual physical exam, Stress management  Neurological Neurological Review of Symptoms: No symptoms reported    HEENT HEENT Symptoms Reported: No symptoms reported      Cardiovascular Cardiovascular Symptoms Reported: No symptoms reported Does patient have uncontrolled Hypertension?: No Cardiovascular Conditions: Hypertension Cardiovascular Management Strategies: Medication therapy, Routine screening, Coping strategies, Adequate rest  Respiratory Respiratory Symptoms Reported: No symptoms reported Other Respiratory Symptoms: Sounds to be in no respiratory distress throughout [facility] call today- reports currently at PCP office for follow up visit as scheduled Respiratory Conditions: Shortness of breath (due to rib fractures post- recent CPR event)  Endocrine Patient reports the following symptoms related to hypoglycemia or hyperglycemia : No symptoms reported Is patient diabetic?: No Endocrine Conditions: Thyroid  disorder Endocrine Management Strategies: Medication therapy  Gastrointestinal Gastrointestinal Symptoms Reported: No symptoms reported, Not assessed Additional Gastrointestinal Details: Currently at PCP office      Genitourinary Genitourinary Symptoms Reported: No symptoms reported Other Genitourinary Symptoms: Currently at PCP office: confirms continues managing hemodialysis needs at home; denies concerns    Integumentary Integumentary Symptoms Reported: No symptoms reported    Musculoskeletal Musculoskelatal Symptoms Reviewed: No symptoms reported        Psychosocial Psychosocial Symptoms Reported: No symptoms reported Additional Psychological Details: Currently at PCP office- will re-assess at next scheduled TOC call  There were no vitals filed for this visit.  Medications Reviewed Today     Reviewed by  Vallie Fayette M, RN (Registered Nurse) on 01/08/24 at 1630  Med List Status: <None>   Medication Order Taking? Sig Documenting Provider Last Dose Status Informant  acetaminophen  (TYLENOL ) 500 MG tablet [id number] No Take 500 mg by mouth every 6 (six) hours as needed. [provider] Taking Active Self  acyclovir  (ZOVIRAX ) 200 MG capsule [id number] No Take 200 mg by mouth 2 (two) times daily as needed (out breaks). [provider] Taking Active Self  albuterol  (VENTOLIN  HFA) 108 (90 Base) MCG/ACT inhaler [id number]  INHALE 2 TO 4 PUFFS INTO THE LUNGS EVERY 4 HOURS AS NEEDED FOR WHEEZING OR COUGH [name], [name], NP-C  Active   amLODipine  (NORVASC ) 10 MG tablet [id number] No Take 10 mg by mouth daily. Per [facility] discharge 12/24/23 [provider] Taking Active Self  ascorbic acid (VITAMIN C) 250 MG CHEW [id number] No Chew 250 mg by mouth daily. [provider] Taking Active Self  atomoxetine  (STRATTERA ) 10 MG capsule [id number] No Take 1 capsule (10 mg total) by mouth 2 (two) times daily with a meal. Arfeen, Bronson Canny, MD Taking Active Self           Med Note Drexel Gentles, Milda Aline   VQQ Jan 02, 2024  9:37 PM) On hold  carvedilol  (COREG ) 12.5 MG tablet [id number] No Take 12.5 mg by mouth 2 (two) times daily with a meal. [provider] Taking Active Self           Med Note Neila Bally, HEATHER L   Fri Nov 08, 2021 10:25 AM)    cinacalcet  (SENSIPAR ) 60 MG tablet [id number] No Take 60 mg by mouth at bedtime. [provider] Taking Active Self           Med Note Drexel Gentles, [facility]   Sat Jan 02, 2024  9:38 PM)    cyclobenzaprine (FLEXERIL) 10 MG tablet [id number] No Take 10 mg by mouth 2 (two) times daily as needed for muscle spasms (per [facility] discharged 12/24/23). [provider] Taking Active Self  diclofenac  Sodium (VOLTAREN ) 1 % GEL [id number] No Apply 1 application  topically 4 (four) times daily as needed (hand,feet,thighs and back pain). [provider]  Taking Active Self           Med Note Carylon Claude, Kraig [place] Sep 25, 2023  3:40 PM)    EPINEPHrine  0.3 mg/0.3 mL IJ SOAJ injection [id number] No Inject 0.3 mg into the muscle as needed for anaphylaxis. [provider] Taking Active Self  epoetin  alfa (EPOGEN ) 20000 UNIT/ML injection [id number] No Inject 20,000 Units into the skin every 14 (fourteen) days. [provider] Taking Active Self  escitalopram  (LEXAPRO ) 10 MG tablet [id number] No Take 1 tablet (10 mg total) by mouth daily. Arfeen, Syed T, MD Taking Active Self           Med Note Drexel Gentles, [place]   WVP Jan 02, 2024  9:40 PM) On hold  ferric citrate  (AURYXIA ) 1 GM 210 MG(Fe) tablet [id number] No Take 420 mg by mouth in the morning, at noon, and at bedtime. [provider] Taking Active Self  furosemide  (LASIX ) 80 MG tablet [id number] No Take 80 mg by mouth daily as needed for edema. [provider] Taking Active Self  gabapentin  (NEURONTIN ) 100 MG capsule [id number] No Take 100 mg by mouth at bedtime. Per [facility] discharged 12/24/23 [provider] Taking Active Self  HYDROcodone -acetaminophen  (  NORCO/VICODIN) 5-325 MG tablet [id number] No Take 1 tablet by mouth 4 (four) times daily as needed for pain.  Taking Active Self  ipratropium-albuterol  (DUONEB) 0.5-2.5 (3) MG/3ML SOLN [id number]  USE 1 AMPULE IN NEBULIZER EVERY 6 HOURS AS NEEDED [name], [name], NP-C  Active   iron  sucrose (VENOFER ) 20 MG/ML injection [id number] No Inject 1,000 mg into the vein every 30 (thirty) days. [provider] Taking Active Self  levothyroxine  (SYNTHROID ) 200 MCG tablet [id number] No Take 200 mcg by mouth at bedtime. [provider] Taking Active Self  loratadine  (CLARITIN ) 10 MG tablet [id number]  Take 20 mg by mouth. [provider]  Active   oxyCODONE -acetaminophen  (PERCOCET) 7.5-325 MG tablet [id number] No Take 1 tablet by mouth every 4 (four) hours as needed for pain, stop hydrocodone  5/325.  Taking  Active   tiZANidine (ZANAFLEX) 4 MG tablet [id number] No Take 1/2 tablet (2 mg total) by mouth 3 (three) times daily as needed.  Taking Active   Vitamin D, Ergocalciferol, (DRISDOL) 1.25 MG (50000 UNIT) CAPS capsule [id number] No Take 50,000 Units by mouth once a week. wednesday [provider] Taking Active Self           Recommendation:   Continue current plan of care  Follow Up Plan:   Telephone follow-up in 1 week- as scheduled 01/12/24  Plan for next week's call: Review medications: full review if patient agreeable Review medication adherence Review provider office visit/ labs/ medication changes 01/04/24- [facility]; 01/07/24- [facility]; 01/04/24 [facility]; 5/16 PCP: tie in visits Review upcoming provider office visits Assess ongoing pain levels Review home dialysis/ daily weights since  Discuss "[facility]" participation (care management program through [facility])   Pls call/ message for questions,  Erlene Hawks, RN, BSN, Media planner  Transitions of Care  [facility] - [facility] [phone]: direct office

## 2024-01-08 NOTE — Patient Instructions (Signed)
Please go downstairs for labs before you leave.

## 2024-01-08 NOTE — Telephone Encounter (Signed)
 CRITICAL VALUE STICKER  CRITICAL VALUE: Creatine 10.71 and GFR 4.23  RECEIVER (on-site recipient of call): Shiryl Ruddy   DATE & TIME NOTIFIED: 01/08/24 at 4:20 pm  MESSENGER (representative from lab): Saa  MD NOTIFIED: Alyson Back NP-C

## 2024-01-08 NOTE — Progress Notes (Signed)
 Subjective:     Patient ID: Holly [name], female    DOB: 08/19/1986, 38 y.o.   MRN: [id number]  Chief Complaint  Patient presents with   Follow-up    FMLA    HPI   History of Present Illness         Nephrologist - Dr. Zelda Hickman Allergist- at [facility]  Pain management- Dr. Valda Garnet  Hematologist- Dr. Marguerita Shih  Psychiatrist - Dr. Carlos Chesterfield  Here for 4 wk follow up on shortness of breath r/t multiple rib fractures that occurred during cardiac resuscitation at [facility].   States her pain is better on higher doses of pain medication since seeing Dr. Valda Garnet. Able to breath better.   Anemia existed prior to ESRD and dialysis   Recent visit to the [facility], ED for shortness of breath as well.  CT chest negative for pneumonia or fluid.  Requesting FMLA paperwork today      Health Maintenance Due  Topic Date Due   DTaP/Tdap/Td (1 - Tdap) Never done   Pneumococcal Vaccine 37-21 Years old (1 of 2 - PCV) 03/08/2007    Past Medical History:  Diagnosis Date   Anemia    Anxiety    Arthritis    hands   Asthmatic bronchitis 02/11/2023   Blood transfusion without reported diagnosis    Cancer (HCC)    Complication of anesthesia    with Fentanyl  she is difficult to awake   Depression    Dyspnea    due to anemia   Epilepsy (HCC)    ESRD on hemodialysis (HCC)    home HD 3 hrs, 5 days per wk   Hashimoto's disease    History of kidney stones    HLD (hyperlipidemia)    Hypertension    Hypothyroidism    Membranoproliferative glomerulonephritis type 2, idiopathic    Pneumonia    Thyroid  disease     Past Surgical History:  Procedure Laterality Date   A/V FISTULAGRAM Left 09/09/2022   Procedure: A/V Fistulagram;  Surgeon: Jackquelyn Mass, MD;  Location: [facility] INVASIVE CV LAB;  Service: Cardiovascular;  Laterality: Left;   ABDOMINAL HYSTERECTOMY  06/24/2022   ABDOMINAL SURGERY     AV FISTULA PLACEMENT Left 01/17/2022   Procedure: ARTERIOVENOUS (AV)  FISTULA CREATION ( RADIAL CEPHALIC);  Surgeon: Jackquelyn Mass, MD;  Location: [facility] ORS;  Service: Vascular;  Laterality: Left;   CERVICAL BIOPSY  W/ LOOP ELECTRODE EXCISION  2018   COLONOSCOPY N/A 01/30/2022   Procedure: COLONOSCOPY;  Surgeon: Quintin Buckle, DO;  Location: [facility] ENDOSCOPY;  Service: Gastroenterology;  Laterality: N/A;   COLONOSCOPY WITH PROPOFOL  N/A 02/01/2022   Procedure: COLONOSCOPY WITH PROPOFOL ;  Surgeon: Shane Darling, MD;  Location: [facility] ENDOSCOPY;  Service: Endoscopy;  Laterality: N/A;   DIAGNOSTIC LAPAROSCOPY     due to removal of PD catheters   DIALYSIS/PERMA CATHETER INSERTION N/A 06/09/2022   Procedure: DIALYSIS/PERMA CATHETER INSERTION;  Surgeon: Celso College, MD;  Location: [facility] INVASIVE CV LAB;  Service: Cardiovascular;  Laterality: N/A;   DIALYSIS/PERMA CATHETER INSERTION N/A 06/30/2022   Procedure: DIALYSIS/PERMA CATHETER INSERTION;  Surgeon: Young Hensen, MD;  Location: [facility] INVASIVE CV LAB;  Service: Cardiovascular;  Laterality: N/A;   DIALYSIS/PERMA CATHETER INSERTION N/A 09/28/2023   Procedure: DIALYSIS/PERMA CATHETER INSERTION;  Surgeon: Baron Border, MD;  Location: [facility] INVASIVE CV LAB;  Service: Cardiovascular;  Laterality: N/A;   DIALYSIS/PERMA CATHETER INSERTION N/A 09/30/2023   Procedure: DIALYSIS/PERMA CATHETER INSERTION;  Surgeon: Patrick Boor,  MD;  Location: [facility] INVASIVE CV LAB;  Service: Cardiovascular;  Laterality: N/A;   DIALYSIS/PERMA CATHETER INSERTION N/A 10/23/2023   Procedure: DIALYSIS/PERMA CATHETER INSERTION;  Surgeon: Melodie Spry, MD;  Location: [facility] INVASIVE CV LAB;  Service: Cardiovascular;  Laterality: N/A;   DIALYSIS/PERMA CATHETER REMOVAL N/A 09/28/2023   Procedure: DIALYSIS/PERMA CATHETER REMOVAL;  Surgeon: Baron Border, MD;  [facility]: [facility]VAS[facility] CV LAB;  Service: Cardiovascular;  Laterality: N/A;   DIALYSIS/PERMA CATHETER REMOVAL N/A 10/23/2023   Procedure: DIALYSIS/PERMA CATHETER REMOVAL;  Surgeon:  Melodie Spry, MD;  Location: [facility] INVASIVE CV LAB;  Service: Cardiovascular;  Laterality: N/A;   ESOPHAGOGASTRODUODENOSCOPY N/A 01/30/2022   Procedure: ESOPHAGOGASTRODUODENOSCOPY (EGD);  Surgeon: Quintin Buckle, DO;  Location: [facility] ENDOSCOPY;  Service: Gastroenterology;  Laterality: N/A;   ESOPHAGOGASTRODUODENOSCOPY (EGD) WITH PROPOFOL  N/A 02/01/2022   Procedure: ESOPHAGOGASTRODUODENOSCOPY (EGD) WITH PROPOFOL ;  Surgeon: Shane Darling, MD;  Location: [facility] ENDOSCOPY;  Service: Endoscopy;  Laterality: N/A;   IR FLUORO GUIDE CV LINE RIGHT  10/24/2023   IR REMOVAL TUN CV CATH W/O FL  10/24/2023   IR US  GUIDE VASC ACCESS RIGHT  10/24/2023   PERIPHERAL VASCULAR BALLOON ANGIOPLASTY Right 09/28/2023   Procedure: PERIPHERAL VASCULAR BALLOON ANGIOPLASTY;  Surgeon: Baron Border, MD;  [facility]: [facility]VAS[facility] CV LAB;  Service: Cardiovascular;  Laterality: Right;  innominate sheath   RENAL BIOPSY     x 2   TUMOR REMOVAL     right face   UTERINE ARTERY EMBOLIZATION  05/04/2022    Family History  Problem Relation Age of Onset   Multiple sclerosis Mother    Cancer Father    COPD Father     Social History   Socioeconomic History   Marital status: Divorced    Spouse name: [name]   Number of children: Not on file   Years of education: Not on file   Highest education level: Associate degree: occupational, Scientist, product/process development, or vocational program  Occupational History   Occupation: [employer]  Tobacco Use   Smoking status: Former    Current packs/day: 0.00    Types: Cigarettes    Quit date: 2023    Years since quitting: 2.3   Smokeless tobacco: Never  Vaping Use   Vaping status: Never Used  Substance and Sexual Activity   Alcohol use: Never   Drug use: Never   Sexual activity: Not Currently  Other Topics Concern   Not on file  Social History Narrative   Lives with [name], significant other. One pet, cat.    Social Drivers of Corporate investment banker Strain: Low Risk  (12/25/2023)    Received from [facility]   Overall Financial Resource Strain (CARDIA)    Difficulty of Paying Living Expenses: Not hard at all  Recent Concern: Financial Resource Strain - Medium Risk (12/23/2023)   Received from [facility]   Overall Financial Resource Strain (CARDIA)    Difficulty of Paying Living Expenses: Somewhat hard  Food Insecurity: No Food Insecurity (12/25/2023)   Hunger Vital Sign    Worried About Running Out of Food in the Last Year: Never true    Ran Out of Food in the Last Year: Never true  Recent Concern: Food Insecurity - Food Insecurity Present (12/23/2023)   Received from [facility]   Hunger Vital Sign    Worried About Running Out of Food in the Last Year: Sometimes true    Ran Out of Food in the Last Year: Never true  Transportation Needs: No Transportation Needs (12/25/2023)   PRAPARE - Administrator, Civil Service (Medical): No    Lack of Transportation (Non-Medical): No  Physical Activity: Insufficiently Active (11/16/2023)   Exercise Vital Sign    Days of Exercise per Week: 3 days    Minutes of Exercise per Session: 30 min  Stress: No Stress Concern Present (11/16/2023)   [employer] of Occupational Health - Occupational Stress Questionnaire    Feeling of Stress : Not at all  Social Connections: Socially Isolated (12/08/2023)   Social Connection and Isolation Panel [NHANES]    Frequency of Communication with Friends and Family: Twice a week    Frequency of Social Gatherings with Friends and Family: Never    Attends Religious Services: Never    Database administrator or Organizations: No    Attends Banker Meetings: Never    Marital Status: Divorced  Catering manager Violence: Not At Risk (12/25/2023)   Humiliation, Afraid, Rape, and [name] questionnaire    Fear of Current or Ex-Partner: No    Emotionally Abused: No    Physically Abused: No    Sexually Abused: No    Outpatient  Medications Prior to Visit  Medication Sig Dispense Refill   acetaminophen  (TYLENOL ) 500 MG tablet Take 500 mg by mouth every 6 (six) hours as needed.     acyclovir  (ZOVIRAX ) 200 MG capsule Take 200 mg by mouth 2 (two) times daily as needed (out breaks).     amLODipine  (NORVASC ) 10 MG tablet Take 10 mg by mouth daily. Per [facility] discharge 12/24/23     ascorbic acid (VITAMIN C) 250 MG CHEW Chew 250 mg by mouth daily.     atomoxetine  (STRATTERA ) 10 MG capsule Take 1 capsule (10 mg total) by mouth 2 (two) times daily with a meal. 60 capsule 2   carvedilol  (COREG ) 12.5 MG tablet Take 12.5 mg by mouth 2 (two) times daily with a meal.     cinacalcet  (SENSIPAR ) 60 MG tablet Take 60 mg by mouth at bedtime.     cyclobenzaprine (FLEXERIL) 10 MG tablet Take 10 mg by mouth 2 (two) times daily as needed for muscle spasms (per [facility] discharged 12/24/23).     diclofenac  Sodium (VOLTAREN ) 1 % GEL Apply 1 application  topically 4 (four) times daily as needed (hand,feet,thighs and back pain).     EPINEPHrine  0.3 mg/0.3 mL IJ SOAJ injection Inject 0.3 mg into the muscle as needed for anaphylaxis.     epoetin  alfa (EPOGEN ) 20000 UNIT/ML injection Inject 20,000 Units into the skin every 14 (fourteen) days.     escitalopram  (LEXAPRO ) 10 MG tablet Take 1 tablet (10 mg total) by mouth daily. 90 tablet 0   ferric citrate  (AURYXIA ) 1 GM 210 MG(Fe) tablet Take 420 mg by mouth in the morning, at noon, and at bedtime.     furosemide  (LASIX ) 80 MG tablet Take 80 mg by mouth daily as needed for edema.     gabapentin  (NEURONTIN ) 100 MG capsule Take 100 mg by mouth at bedtime. Per [facility] discharged 12/24/23     HYDROcodone -acetaminophen  (NORCO/VICODIN) 5-325 MG tablet Take 1 tablet by mouth 4 (four) times daily as needed for pain. 120 tablet 0   iron  sucrose (VENOFER ) 20 MG/ML injection Inject 1,000 mg into the vein every 30 (thirty) days.     levothyroxine  (SYNTHROID ) 200 MCG tablet Take 200 mcg by mouth at bedtime.     loratadine   (CLARITIN ) 10 MG tablet Take 20 mg  by mouth.     oxyCODONE -acetaminophen  (PERCOCET) 7.5-325 MG tablet Take 1 tablet by mouth every 4 (four) hours as needed for pain, stop hydrocodone  5/325. 90 tablet 0   tiZANidine (ZANAFLEX) 4 MG tablet Take 1/2 tablet (2 mg total) by mouth 3 (three) times daily as needed. 45 tablet 0   Vitamin D, Ergocalciferol, (DRISDOL) 1.25 MG (50000 UNIT) CAPS capsule Take 50,000 Units by mouth once a week. wednesday     albuterol  (VENTOLIN  HFA) 108 (90 Base) MCG/ACT inhaler Inhale 2-4 puffs into the lungs every 4 (four) hours as needed for wheezing (or cough). 18 g 0   ipratropium-albuterol  (DUONEB) 0.5-2.5 (3) MG/3ML SOLN Take 3 mLs by nebulization every 6 (six) hours as needed. 360 mL 0   cetirizine (ZYRTEC) 10 MG tablet Take 10 mg by mouth at bedtime. (Patient not taking: Reported on 01/08/2024)     predniSONE  (DELTASONE ) 20 MG tablet Take 2 tablets (40 mg total) by mouth daily with breakfast. (Patient not taking: Reported on 01/08/2024) 10 tablet 0   No facility-administered medications prior to visit.    Allergies  Allergen Reactions   Ancef  [Cefazolin ] Anaphylaxis    Patient reports 12/11/23 hospitalization   Ciprofloxacin Anaphylaxis and Other (See Comments)    Caused Seizures   Seizures   Other reaction(s): Other (See Comments)  Seizures  Seizures     Caused Seizures  Seizures  Seizures  Other reaction(s): Other (See Comments) Seizures  Seizures  Seizures     Other reaction(s): Other (See Comments) Seizures  Seizures  Seizures     Caused Seizures  Caused Seizures  Seizures  Seizures  Other reaction(s): Other (See Comments) Seizures  Seizures  Seizures    Seizures Grand mal   Levetiracetam Anaphylaxis and Other (See Comments)    Medical Record H&P (unknown reaction)  Medical Record H&P  Other reaction(s): Other (See Comments)   Oxycodone -Acetaminophen  Nausea Only and Other (See Comments)   Rituximab Anaphylaxis and Swelling   Tramadol Anaphylaxis  and Other (See Comments)    Caused seizures  Other reaction(s): Other (See Comments)  Medical Record H&P  Medical Record H&P    Caused seizures  Other Reaction(s): Other (See Comments)  Caused seizures  Medical Record H&P  Medical Record H&P  Other reaction(s): seizures  Medical Record H&P  Medical Record H&P  Medical Record H&P   Chlorhexidine  Other (See Comments)    Pt states skin becomes irritated and blisters    Review of Systems  Constitutional:  Positive for malaise/fatigue. Negative for chills and fever.  Respiratory:  Positive for shortness of breath. Negative for cough.   Cardiovascular:  Negative for chest pain, palpitations and leg swelling.       Chest wall pain  Gastrointestinal:  Negative for abdominal pain, nausea and vomiting.  Genitourinary:  Negative for dysuria, frequency and urgency.  Musculoskeletal:  Positive for joint pain and myalgias.  Neurological:  Negative for dizziness and focal weakness.  Psychiatric/Behavioral:         Slower cognitive ability since resuscitation        Objective:     Physical Exam Constitutional:      General: She is not in acute distress.    Appearance: She is not ill-appearing.  HENT:     Mouth/Throat:     Mouth: Mucous membranes are moist.  Eyes:     Extraocular Movements: Extraocular movements intact.     Conjunctiva/sclera: Conjunctivae normal.  Cardiovascular:     Rate and Rhythm: Normal rate  and regular rhythm.  Pulmonary:     Effort: Pulmonary effort is normal.     Breath sounds: Normal breath sounds.  Musculoskeletal:     Cervical back: Normal range of motion and neck supple.     Right lower leg: No edema.     Left lower leg: No edema.  Skin:    General: Skin is warm and dry.  Neurological:     General: No focal deficit present.     Mental Status: She is alert and oriented to person, place, and time.     Motor: No weakness.     Coordination: Coordination normal.     Gait: Gait normal.   Psychiatric:        Mood and Affect: Mood normal.        Behavior: Behavior normal.        Thought Content: Thought content normal.      BP 126/84 (BP Location: Left Arm, Patient Position: Sitting)   Pulse 94   Temp 97.9 F (36.6 C) (Temporal)   Ht 5\' 7"  (1.702 m)   Wt 209 lb (94.8 kg)   LMP 03/31/2022 (Exact Date) Comment: pt bleeding last 6 weeks with recent multiple transfusions  SpO2 98%   BMI 32.73 kg/m  Wt Readings from Last 3 Encounters:  01/08/24 209 lb (94.8 kg)  01/02/24 213 lb (96.6 kg)  12/25/23 213 lb (96.6 kg)        Assessment & Plan:   Problem List Items Addressed This Visit     ESRD on dialysis (HCC) (Chronic)   Anemia of chronic disease   Relevant Orders   CBC with Differential/Platelet (Completed)   Anxiety and depression   Chronic pain syndrome   Closed fracture of multiple ribs - Primary   Hyperkalemia   Relevant Orders   Basic metabolic panel with GFR (Completed)   Other Visit Diagnoses       Shortness of breath          Reviewed notes and results from ED visit on 01/02/2024. Overall she is improving.  She is euvolemic today. Respirations less labored. Stable appearing.  She attributes improvement in breathing to her pain being better controlled.  Currently on higher doses of pain medication due to numerous rib fractures related to CPR.  Dr. Valda Garnet is her pain management specialist.  FMLA paperwork filled out.  She may need an extension if she is still requiring high doses of pain medication.  Recommend she start seeing therapy due to the traumatic event she experienced of cardiac arrest related to anaphylaxis.   Recently elevated potassium.  Recheck BMP ESRD on dialysis.   I have discontinued Jerilynn Montenegro. Vidaurri's cetirizine and predniSONE . I am also having her maintain her carvedilol , cinacalcet , levothyroxine , acyclovir , diclofenac  Sodium, epoetin  alfa, escitalopram , iron  sucrose, atomoxetine , Vitamin D (Ergocalciferol), ferric citrate ,  furosemide , amLODipine , ascorbic acid, cyclobenzaprine, gabapentin , acetaminophen , EPINEPHrine , HYDROcodone -acetaminophen , oxyCODONE -acetaminophen , tiZANidine, and loratadine .  No orders of the defined types were placed in this encounter.

## 2024-01-11 ENCOUNTER — Other Ambulatory Visit: Payer: Medicare Other

## 2024-01-12 ENCOUNTER — Telehealth (HOSPITAL_BASED_OUTPATIENT_CLINIC_OR_DEPARTMENT_OTHER): Payer: Self-pay | Admitting: Psychiatry

## 2024-01-12 ENCOUNTER — Telehealth: Payer: Self-pay | Admitting: *Deleted

## 2024-01-12 ENCOUNTER — Encounter: Payer: Self-pay | Admitting: *Deleted

## 2024-01-12 ENCOUNTER — Encounter (HOSPITAL_COMMUNITY): Payer: Self-pay

## 2024-01-12 DIAGNOSIS — Z91199 Patient's noncompliance with other medical treatment and regimen due to unspecified reason: Secondary | ICD-10-CM

## 2024-01-12 NOTE — Progress Notes (Signed)
 Patient is no-show on video platform.  Called and left a message to reschedule appointment.

## 2024-01-13 ENCOUNTER — Encounter: Payer: Self-pay | Admitting: *Deleted

## 2024-01-14 ENCOUNTER — Encounter: Payer: Self-pay | Admitting: *Deleted

## 2024-01-14 ENCOUNTER — Encounter: Payer: Self-pay | Admitting: Physician Assistant

## 2024-01-14 ENCOUNTER — Other Ambulatory Visit (HOSPITAL_COMMUNITY): Payer: Self-pay

## 2024-01-14 MED ORDER — HYDROCODONE-ACETAMINOPHEN 7.5-325 MG PO TABS
1.0000 | ORAL_TABLET | Freq: Every day | ORAL | 0 refills | Status: AC | PRN
  Filled 2024-01-14: qty 100, 20d supply, fill #0

## 2024-01-15 ENCOUNTER — Ambulatory Visit: Admitting: Family Medicine

## 2024-01-15 ENCOUNTER — Other Ambulatory Visit: Payer: Self-pay | Admitting: *Deleted

## 2024-01-15 NOTE — Patient Instructions (Signed)
 Visit Information  Thank you for taking time to visit with me today. Please don't hesitate to contact me if I can be of assistance to you before our next scheduled telephone appointment.  Our next appointment is by telephone on Wednesday, Jan 20, 2024 at 1:00 pm  Please call the care guide team at [phone] if you need to cancel or reschedule your appointment.   Following are the goals we discussed today:  Patient Self Care Activities:  Attend all scheduled provider appointments Call pharmacy for medication refills 3-7 days in advance of running out of medications Call provider office for new concerns or questions  Participate in Transition of Care Program/Attend TOC scheduled calls Take medications as prescribed   If you believe your condition is getting worse- contact your care providers (doctors) promptly- reaching out to your doctor early when you have concerns can prevent you from having to go to the hospital Continue performing hemodialysis at home as scheduled: do not miss any hemodialysis sessions  If you are experiencing a Mental Health or Behavioral Health Crisis or need someone to talk to, please  call the Suicide and Crisis Lifeline: 988 call the [place]  National Suicide Prevention Lifeline: (901)278-6219 or TTY: (913)424-0689 TTY (564) 239-9924) to talk to a trained counselor call 1-800-273-TALK (toll free, 24 hour hotline) go to [facility] [address], [place] [phone]) call the [facility] Crisis Line: (778) 110-7952 call 911   Patient verbalizes understanding of instructions and care plan provided today and agrees to view in MyChart. Active MyChart status and patient understanding of how to access instructions and care plan via MyChart confirmed with patient.     Herby Amick Mckinney Kaileb Monsanto, RN, BSN, Media planner  Transitions of Care  [facility] [phone]: direct office

## 2024-01-15 NOTE — Transitions of Care (Post Inpatient/ED Visit) (Signed)
 Transition of Care week 4/ day # 21  Visit Note  01/15/2024  Name: Holly [name] MRN: [id number]          DOB: 09/29/1985  Situation: Patient enrolled in [facility] 30-day program. Visit completed with patient by telephone.   HIPAA identifiers x 2 verified  Background:  2 recent hospital admissions:  4/14-15, 2025: for symptomatic anemia secondary to hemodialysis at home [facility])  4/18-23, 2025: for presumed anaphylaxis from Ancef , requiring prolonged CPR/ intubation/ life support during planned procedure admission for HD access  Initial Transition Care Management Follow-up Telephone Call    Past Medical History:  Diagnosis Date   Anemia    Anxiety    Arthritis    hands   Asthmatic bronchitis 02/11/2023   Blood transfusion without reported diagnosis    Cancer (HCC)    Complication of anesthesia    with Fentanyl  she is difficult to awake   Depression    Dyspnea    due to anemia   Epilepsy (HCC)    ESRD on hemodialysis (HCC)    home HD 3 hrs, 5 days per wk   Hashimoto's disease    History of kidney stones    HLD (hyperlipidemia)    Hypertension    Hypothyroidism    Membranoproliferative glomerulonephritis type 2, idiopathic    Pneumonia    Thyroid  disease    Assessment:  "I am doing much better now that my pain is under better control; the only thing bothering me now is nausea from the increased dose of pain medication, but I am in touch with my pain management doctor today- he is working on making sure my medications are right.  Had hemodialysis last night, went fine.  My anxiety is under much better control now that my pain is under better control and I am pacing myself better.  Had appointment with nephrologist yesterday, it went fine, had no concerns.  I got my leave from work approved and will be going back to work on June 2."   Denies clinical concerns and sounds to be in no distress throughout [name] 30-day program outreach call today   Patient  Reported Symptoms: Cognitive Cognitive Status: Alert and oriented to person, place, and time, Insightful and able to interpret abstract concepts, Normal speech and language skills Cognitive/Intellectual Conditions Management [RPT]: None reported or documented in medical history or problem list   Health Maintenance Behaviors: Annual physical exam, Stress management, Healthy diet  Neurological Neurological Review of Symptoms: No symptoms reported    HEENT HEENT Symptoms Reported: No symptoms reported      Cardiovascular Cardiovascular Symptoms Reported: No symptoms reported Does patient have uncontrolled Hypertension?: No Cardiovascular Management Strategies: Medication therapy, Routine screening, Coping strategies, Adequate rest  Respiratory Respiratory Symptoms Reported: No symptoms reported Other Respiratory Symptoms: Denies shortness of breath/ cough/ concerns around breathing and sounds to be in no respiratory distress throughout TOC call Respiratory Conditions: Shortness of breath  Endocrine Patient reports the following symptoms related to hypoglycemia or hyperglycemia : No symptoms reported Is patient diabetic?: No Endocrine Conditions: Thyroid  disorder Endocrine Management Strategies: Medication therapy  Gastrointestinal Gastrointestinal Symptoms Reported: Nausea Additional Gastrointestinal Details: Reports intermittent nausea secondary to increased dosing of pain medication by pain management provider secondary to rib/ sternum fractures post- recent prolonged CPR- communicating regularly with pain management provider; taking anti-nausea medications as prescribed; denies emesis Gastrointestinal Conditions: Nausea Gastrointestinal Management Strategies: Coping strategies, Medication therapy, Diet modification    Genitourinary Genitourinary Symptoms Reported: No symptoms  reported, Other Other Genitourinary Symptoms: Reports continues self-hemodialysis at home: reports last hemodialysis  "last night; went fine" continues self- urinating in small amounts over hemodialysis sessions Additional Genitourinary Details: Reviewed recent nephrology provider appointment 01/15/24 with patient - reports went well, no changes in overall plan of care; reports discussed last K+ with provider: reported by patient today as "4.8" on 01/12/24- reports "this is still a little high, but it's pretty much in range of my normal baseline value, my nephrologist was okay with it" Genitourinary Conditions: End-stage renal disease Genitourinary Management Strategies: Hemodialysis Hemodialysis Schedule: Continues home hemodialysis 5 times per week- "at various times;" reports adherent to following established hemodialysis sessions at home Hemodialysis Last Treatment: 01/14/24  Integumentary Integumentary Symptoms Reported: No symptoms reported Additional Integumentary Details: Reports current hemodialysis port site "looks fine" and denies signs/ symptoms infection; reports "nephrologist was happy with it yesterday" during office visit Skin Conditions: Other Other Skin Conditions: hemodialysis port- chest Skin Management Strategies: Medical device, Routine screening  Musculoskeletal Musculoskelatal Symptoms Reviewed: No symptoms reported Additional Musculoskeletal Details: confirmed not currently requiring/ using assistive devices for ambulation        Psychosocial Psychosocial Symptoms Reported: No symptoms reported Additional Psychological Details: Reports "great improvement in anxiety when the pain management doctor adjusted my pain medicine and my pain decreased;" denies anxiety today Behavioral Health Conditions: Anxiety Behavioral Management Strategies: Counseling, Support system, Medication therapy, Adequate rest, Coping strategies Major Change/Loss/Stressor/Fears (CP): Medical condition, self Techniques to Cope with Loss/Stress/Change: Medication, Diversional activities Quality of Family Relationships:  supportive, helpful, involved   There were no vitals filed for this visit.  Medications Reviewed Today     Reviewed by Holly Heinrich M, RN (Registered Nurse) on 01/15/24 at 1411  Med List Status: <None>   Medication Order Taking? Sig Documenting Provider Last Dose Status Informant  acetaminophen  (TYLENOL ) 500 MG tablet [id number] No Take 500 mg by mouth every 6 (six) hours as needed. [provider] Taking Active Self  acyclovir  (ZOVIRAX ) 200 MG capsule [id number] No Take 200 mg by mouth 2 (two) times daily as needed (out breaks). [provider] Taking Active Self  albuterol  (VENTOLIN  HFA) 108 (90 Base) MCG/ACT inhaler [id number]  INHALE 2 TO 4 PUFFS INTO THE LUNGS EVERY 4 HOURS AS NEEDED FOR WHEEZING OR COUGH [name], [name], NP-C  Active   amLODipine  (NORVASC ) 10 MG tablet [id number] No Take 10 mg by mouth daily. Per [facility] discharge 12/24/23 [provider] Taking Active Self  ascorbic acid (VITAMIN C) 250 MG CHEW [id number] No Chew 250 mg by mouth daily. [provider] Taking Active Self  atomoxetine  (STRATTERA ) 10 MG capsule [id number] No Take 1 capsule (10 mg total) by mouth 2 (two) times daily with a meal. Arfeen, Bronson Canny, MD Taking Active Self           Med Note Drexel Gentles, Milda Aline   FIE Jan 02, 2024  9:37 PM) On hold  carvedilol  (COREG ) 12.5 MG tablet [id number] No Take 12.5 mg by mouth 2 (two) times daily with a meal. [provider] Taking Active Self           Med Note Neila Bally, HEATHER L   Fri Nov 08, 2021 10:25 AM)    cinacalcet  (SENSIPAR ) 60 MG tablet [id number] No Take 60 mg by mouth at bedtime. [provider] Taking Active Self           Med Note Drexel Gentles, [place]   [facility] Jan 02, 2024  9:38 PM)    cyclobenzaprine (  FLEXERIL) 10 MG tablet [id number] No Take 10 mg by mouth 2 (two) times daily as needed for muscle spasms (per [facility] discharged 12/24/23). [provider] Taking Active Self  diclofenac  Sodium (VOLTAREN ) 1 % GEL [id number] No  Apply 1 application  topically 4 (four) times daily as needed (hand,feet,thighs and back pain). [provider] Taking Active Self           Med Note Carylon Claude, Kraig [place] Sep 25, 2023  3:40 PM)    EPINEPHrine  0.3 mg/0.3 mL IJ SOAJ injection [id number] No Inject 0.3 mg into the muscle as needed for anaphylaxis. [provider] Taking Active Self  epoetin  alfa (EPOGEN ) 20000 UNIT/ML injection [id number] No Inject 20,000 Units into the skin every 14 (fourteen) days. [provider] Taking Active Self  escitalopram  (LEXAPRO ) 10 MG tablet [id number] No Take 1 tablet (10 mg total) by mouth daily. Arfeen, Syed T, MD Taking Active Self           Med Note Drexel Gentles, [place]   [facility] Jan 02, 2024  9:40 PM) On hold  ferric citrate  (AURYXIA ) 1 GM 210 MG(Fe) tablet [id number] No Take 420 mg by mouth in the morning, at noon, and at bedtime. [provider] Taking Active Self  furosemide  (LASIX ) 80 MG tablet [id number] No Take 80 mg by mouth daily as needed for edema. [provider] Taking Active Self  gabapentin  (NEURONTIN ) 100 MG capsule [id number] No Take 100 mg by mouth at bedtime. Per [facility] discharged 12/24/23 [provider] Taking Active Self  HYDROcodone -acetaminophen  (NORCO) 7.5-325 MG tablet [id number]  Take 1 tablet by mouth 5 (five) times daily as needed for pain stop percocet 7.5/325.   Active   HYDROcodone -acetaminophen  (NORCO/VICODIN) 5-325 MG tablet [id number] No Take 1 tablet by mouth 4 (four) times daily as needed for pain.  Taking Active Self  ipratropium-albuterol  (DUONEB) 0.5-2.5 (3) MG/3ML SOLN [id number]  USE 1 AMPULE IN NEBULIZER EVERY 6 HOURS AS NEEDED [name], [name], NP-C  Active   iron  sucrose (VENOFER ) 20 MG/ML injection [id number] No Inject 1,000 mg into the vein every 30 (thirty) days. [provider] Taking Active Self  levothyroxine  (SYNTHROID ) 200 MCG tablet [id number] No Take 200 mcg by mouth at bedtime. [provider]  Taking Active Self  loratadine  (CLARITIN ) 10 MG tablet [id number]  Take 20 mg by mouth. [provider]  Active   oxyCODONE -acetaminophen  (PERCOCET) 7.5-325 MG tablet [id number] No Take 1 tablet by mouth every 4 (four) hours as needed for pain, stop hydrocodone  5/325.  Taking Active   tiZANidine  (ZANAFLEX ) 4 MG tablet [id number] No Take 1/2 tablet (2 mg total) by mouth 3 (three) times daily as needed.  Taking Active   Vitamin D, Ergocalciferol, (DRISDOL) 1.25 MG (50000 UNIT) CAPS capsule [id number] No Take 50,000 Units by mouth once a week. wednesday [provider] Taking Active Self           Recommendation:   Continue established plan of care  Follow Up Plan:   Telephone follow-up in 1 week- as scheduled 01/20/24  Plan for next week's call: Review medication adherence Review upcoming provider office visits Assess ongoing pain/ anxiety levels- nausea control from increased pain medication dosing Review home dialysis/ daily weights since last outreach 01/15/24  Pls call/ message for questions,  Cris Gibby Mckinney Alvaro Aungst, RN, BSN, CCRN Alumnus RN Care Manager  Transitions of Care  [facility] - [facility] [phone]: direct office

## 2024-01-20 ENCOUNTER — Other Ambulatory Visit: Payer: Self-pay | Admitting: *Deleted

## 2024-01-20 NOTE — Patient Instructions (Signed)
 Visit Information  Thank you for taking time to visit with me today. Please don't hesitate to contact me if I can be of assistance to you before our next scheduled telephone appointment.  Our next appointment is by telephone on Monday 01/25/24 at 2:00 pm  Please call the care guide team at [phone] if you need to cancel or reschedule your appointment.   Following are the goals we discussed today:  Patient Self Care Activities:  Attend all scheduled provider appointments Call pharmacy for medication refills 3-7 days in advance of running out of medications Call provider office for new concerns or questions  Participate in Transition of Care Program/Attend TOC scheduled calls Take medications as prescribed   If you believe your condition is getting worse- contact your care providers (doctors) promptly- reaching out to your doctor early when you have concerns can prevent you from having to go to the hospital Continue performing hemodialysis at home as scheduled: do not miss any hemodialysis sessions  If you are experiencing a Mental Health or Behavioral Health Crisis or need someone to talk to, please  call the Suicide and Crisis Lifeline: 988 call the [place]  National Suicide Prevention Lifeline: 463-242-7398 or TTY: 231 453 9913 TTY (681)108-6613) to talk to a trained counselor call 1-800-273-TALK (toll free, 24 hour hotline) go to [facility] [address], [place] [phone]) call the [facility] Crisis Line: 936-531-8292 call 911   Patient verbalizes understanding of instructions and care plan provided today and agrees to view in MyChart. Active MyChart status and patient understanding of how to access instructions and care plan via MyChart confirmed with patient.     [place] Mckinney Sid Greener, RN, BSN, Media planner  Transitions of Care  [facility] - [facility] [phone]: direct office

## 2024-01-20 NOTE — Transitions of Care (Post Inpatient/ED Visit) (Signed)
 Transition of Care week # 5/ day # 26  Visit Note  01/20/2024  Name: Holly [name] MRN: [id number]          DOB: 1986/06/06  Situation: Patient enrolled in [facility] 30-day program. Visit completed with patient by telephone.   HIPAA identifiers x 2 verified  Background:  2 recent hospital admissions: 4/14-15, 2025: for symptomatic anemia secondary to hemodialysis at home [facility]) 4/18-23, 2025: for presumed anaphylaxis from Ancef , requiring prolonged CPR/ intubation/ life support during planned procedure admission for HD access  Initial Transition Care Management Follow-up Telephone Call    Past Medical History:  Diagnosis Date   Anemia    Anxiety    Arthritis    hands   Asthmatic bronchitis 02/11/2023   Blood transfusion without reported diagnosis    Cancer (HCC)    Complication of anesthesia    with Fentanyl  she is difficult to awake   Depression    Dyspnea    due to anemia   Epilepsy (HCC)    ESRD on hemodialysis (HCC)    home HD 3 hrs, 5 days per wk   Hashimoto's disease    History of kidney stones    HLD (hyperlipidemia)    Hypertension    Hypothyroidism    Membranoproliferative glomerulonephritis type 2, idiopathic    Pneumonia    Thyroid  disease    Assessment:  "I am still doing much better--- my pain and nausea are under better control after the adjustment made by my pain management doctor last week; I am thinking about counseling options like Vickie recommended- not sure I need it, because things are better now that everything worked out with my disability at work-- so I don't need a Child psychotherapist to help me; I think I got it.  Looking forward to starting work tomorrow next week.  Everything is at my baseline with my hemodialysis and going fine- still doing it on my own at home 5 times per week;"    Denies clinical concerns and sounds to be in no distress throughout [facility] 30-day program outreach call today    Patient Reported  Symptoms: Cognitive Cognitive Status: Alert and oriented to person, place, and time, Insightful and able to interpret abstract concepts, Normal speech and language skills Cognitive/Intellectual Conditions Management [RPT]: None reported or documented in medical history or problem list   Health Maintenance Behaviors: Annual physical exam, Stress management, Healthy diet Health Facilitated by: Healthy diet, Pain control, Rest, Stress management  Neurological Neurological Review of Symptoms: No symptoms reported    HEENT HEENT Symptoms Reported: No symptoms reported      Cardiovascular Cardiovascular Symptoms Reported: No symptoms reported Does patient have uncontrolled Hypertension?: No Cardiovascular Conditions: Hypertension Cardiovascular Management Strategies: Medication therapy, Routine screening, Diet modification, Coping strategies, Fluid modification  Respiratory Respiratory Symptoms Reported: No symptoms reported Other Respiratory Symptoms: Denies shortness of breat/ cough; sounds to be in no distress throughout [facility] call; reports last hemodialysis at home 01/19/24    Endocrine Patient reports the following symptoms related to hypoglycemia or hyperglycemia : No symptoms reported Is patient diabetic?: No    Gastrointestinal Gastrointestinal Symptoms Reported: No symptoms reported Additional Gastrointestinal Details: Reports improvement in nausea reported at time of last week TOC call- reports pain management provider adjusted her medications, and her nausea has "improved;" reports "good pain control;" declines pain assesment, states "I am fine now;" confirmed next appointment at pain management provider office scheduled for 02/03/24 Gastrointestinal Management Strategies: Coping strategies, Diet modification, Medication  therapy    Genitourinary Genitourinary Symptoms Reported: Other Other Genitourinary Symptoms: Confitinues performing self-hemodialysis at home; reports last hemodialysis  on 01/19/24; reports "weights and potassium are all at my baseline;" reports K+ on 01/19/24 as 4.8; declines specific review of weights, states not near recorded information- reports "I am at my baseline with everything; everything is fine" Genitourinary Conditions: End-stage renal disease Genitourinary Management Strategies: Hemodialysis Hemodialysis Schedule: Confirmed continues home- self hemodialysis 5 times per week Hemodialysis Last Treatment: 01/19/24 Genitourinary Self-Management Outcome: 4 (good)  Integumentary Integumentary Symptoms Reported: No symptoms reported    Musculoskeletal Musculoskelatal Symptoms Reviewed: No symptoms reported Additional Musculoskeletal Details: confirmed not currently requiring/ using assistive devices for ambulation        Psychosocial Psychosocial Symptoms Reported: No symptoms reported Additional Psychological Details: Again reports that "anxiety and sadness is much better; I feel like I don't have them any mmore; I am looking into counseling, but not sure if I really need it;" today, patient declines my offer to place [facility] LCSW referral to assist with counseling resources/ options Behavioral Health Conditions: Anxiety Behavioral Management Strategies: Support system, Coping strategies, Adequate rest, Medication therapy Major Change/Loss/Stressor/Fears (CP): Medical condition, self Techniques to Cope with Loss/Stress/Change: Diversional activities, Medication Quality of Family Relationships: supportive, involved, helpful Do you feel physically threatened by others?: No   There were no vitals filed for this visit.  Medications Reviewed Today     Reviewed by Dasia Guerrier M, RN (Registered Nurse) on 01/20/24 at 1309  Med List Status: <None>   Medication Order Taking? Sig Documenting Provider Last Dose Status Informant  acetaminophen  (TYLENOL ) 500 MG tablet [id number] No Take 500 mg by mouth every 6 (six) hours as needed. [provider] Taking  Active Self  acyclovir  (ZOVIRAX ) 200 MG capsule [id number] No Take 200 mg by mouth 2 (two) times daily as needed (out breaks). [provider] Taking Active Self  albuterol  (VENTOLIN  HFA) 108 (90 Base) MCG/ACT inhaler [id number]  INHALE 2 TO 4 PUFFS INTO THE LUNGS EVERY 4 HOURS AS NEEDED FOR WHEEZING OR COUGH [name], [name], NP-C  Active   amLODipine  (NORVASC ) 10 MG tablet [id number] No Take 10 mg by mouth daily. Per [facility] discharge 12/24/23 [provider] Taking Active Self  ascorbic acid (VITAMIN C) 250 MG CHEW [id number] No Chew 250 mg by mouth daily. [provider] Taking Active Self  atomoxetine  (STRATTERA ) 10 MG capsule [id number] No Take 1 capsule (10 mg total) by mouth 2 (two) times daily with a meal. Arfeen, Bronson Canny, MD Taking Active Self           Med Note Maryjo Snipe   BTD Jan 02, 2024  9:37 PM) On hold  carvedilol  (COREG ) 12.5 MG tablet [id number] No Take 12.5 mg by mouth 2 (two) times daily with a meal. [provider] Taking Active Self           Med Note Neila Bally, HEATHER L   Fri Nov 08, 2021 10:25 AM)    cinacalcet  (SENSIPAR ) 60 MG tablet [id number] No Take 60 mg by mouth at bedtime. [provider] Taking Active Self           Med Note Drexel Gentles, [facility]   Sat Jan 02, 2024  9:38 PM)    cyclobenzaprine (FLEXERIL) 10 MG tablet [id number] No Take 10 mg by mouth 2 (two) times daily as needed for muscle spasms (per [facility] discharged 12/24/23). [provider] Taking Active Self  diclofenac  Sodium (VOLTAREN ) 1 % GEL [id number] No  Apply 1 application  topically 4 (four) times daily as needed (hand,feet,thighs and back pain). [provider] Taking Active Self           Med Note Carylon Claude, Kraig [place] Sep 25, 2023  3:40 PM)    EPINEPHrine  0.3 mg/0.3 mL IJ SOAJ injection [id number] No Inject 0.3 mg into the muscle as needed for anaphylaxis. [provider] Taking Active Self  epoetin  alfa (EPOGEN ) 20000 UNIT/ML injection  [id number] No Inject 20,000 Units into the skin every 14 (fourteen) days. [provider] Taking Active Self  escitalopram  (LEXAPRO ) 10 MG tablet [id number] No Take 1 tablet (10 mg total) by mouth daily. Arfeen, Syed T, MD Taking Active Self           Med Note Drexel Gentles, [place]   YQM Jan 02, 2024  9:40 PM) On hold  ferric citrate  (AURYXIA ) 1 GM 210 MG(Fe) tablet [id number] No Take 420 mg by mouth in the morning, at noon, and at bedtime. [provider] Taking Active Self  furosemide  (LASIX ) 80 MG tablet [id number] No Take 80 mg by mouth daily as needed for edema. [provider] Taking Active Self  gabapentin  (NEURONTIN ) 100 MG capsule [id number] No Take 100 mg by mouth at bedtime. Per [facility] discharged 12/24/23 [provider] Taking Active Self  HYDROcodone -acetaminophen  (NORCO) 7.5-325 MG tablet [id number]  Take 1 tablet by mouth 5 (five) times daily as needed for pain stop percocet 7.5/325.   Active   HYDROcodone -acetaminophen  (NORCO/VICODIN) 5-325 MG tablet [id number] No Take 1 tablet by mouth 4 (four) times daily as needed for pain.  Taking Active Self  ipratropium-albuterol  (DUONEB) 0.5-2.5 (3) MG/3ML SOLN [id number]  USE 1 AMPULE IN NEBULIZER EVERY 6 HOURS AS NEEDED [name], [name], NP-C  Active   iron  sucrose (VENOFER ) 20 MG/ML injection [id number] No Inject 1,000 mg into the vein every 30 (thirty) days. [provider] Taking Active Self  levothyroxine  (SYNTHROID ) 200 MCG tablet [id number] No Take 200 mcg by mouth at bedtime. [provider] Taking Active Self  loratadine  (CLARITIN ) 10 MG tablet [id number]  Take 20 mg by mouth. [provider]  Active   oxyCODONE -acetaminophen  (PERCOCET) 7.5-325 MG tablet [id number] No Take 1 tablet by mouth every 4 (four) hours as needed for pain, stop hydrocodone  5/325.  Taking Active   tiZANidine  (ZANAFLEX ) 4 MG tablet [id number] No Take 1/2 tablet (2 mg total) by mouth 3 (three) times daily as needed.  Taking  Active   Vitamin D, Ergocalciferol, (DRISDOL) 1.25 MG (50000 UNIT) CAPS capsule [id number] No Take 50,000 Units by mouth once a week. wednesday [provider] Taking Active Self           Recommendation:   Continue established plan of care  Follow Up Plan:   Telephone follow-up in 1 week- as scheduled 01/25/24 for TOC 30-day program case closure if no hospital re-admission  Plan for next week's call: Review medication adherence Review upcoming provider office visits Assess ongoing pain/ anxiety levels Review home dialysis/ daily weights since last outreach 01/20/24 TOC 30-day program case closure if no hospital re-admission; with transfer to longitudinal RN CM if patient agreeable  Pls call/ message for questions,  Erlene Hawks, RN, BSN, CCRN Alumnus RN Care Manager  Transitions of Care  [facility] - [facility] [phone]: direct office

## 2024-01-25 ENCOUNTER — Other Ambulatory Visit: Payer: Self-pay | Admitting: *Deleted

## 2024-01-25 NOTE — Transitions of Care (Post Inpatient/ED Visit) (Signed)
 Transition of Care week # 6/ day # 31 TOC 30-day program case closure  Visit Note  01/25/2024  Name: Holly [name] MRN: [id number]          DOB: April 01, 1986  Situation: Patient enrolled in [facility] 30-day program. Visit completed with patient by telephone.   HIPAA identifiers x 2 verified  Background:  Self- completes hemodialysis at home: 5 times per week, at patient's discretion re: scheduling 2 recent hospital admissions: 4/14-15, 2025: for symptomatic anemia secondary to hemodialysis at home [facility]) 4/18-23, 2025: for presumed anaphylaxis from Ancef , requiring prolonged CPR/ intubation/ life support during planned procedure admission for HD access  No further follow up required: Patient participated in TOC-30 day program and has met previously established goals; today denies need for ongoing care management outreach and confirms she has my direct phone number should needs arise in the future  Initial Transition Care Management Follow-up Telephone Call    Past Medical History:  Diagnosis Date   Anemia    Anxiety    Arthritis    hands   Asthmatic bronchitis 02/11/2023   Blood transfusion without reported diagnosis    Cancer (HCC)    Complication of anesthesia    with Fentanyl  she is difficult to awake   Depression    Dyspnea    due to anemia   Epilepsy (HCC)    ESRD on hemodialysis (HCC)    home HD 3 hrs, 5 days per wk   Hashimoto's disease    History of kidney stones    HLD (hyperlipidemia)    Hypertension    Hypothyroidism    Membranoproliferative glomerulonephritis type 2, idiopathic    Pneumonia    Thyroid  disease    Assessment: "I am still doing great--- my pain and nausea are still under good control overall, after the adjustment made by my pain management doctor; I am back to my normal self now; started working today, and I am doing okay with it.  My boyfriend still helps me if I need anything, but I am pretty much totally independent except  for resuming driving.  With me working now, I don't think I am going to need any more additional calls. My hemodialysis at home is going great- we are old hats at doing it, and there are no problems"    Denies clinical concerns and sounds to be in no distress throughout [facility] 30-day program outreach call today   Patient Reported Symptoms: Cognitive Cognitive Status: Alert and oriented to person, place, and time, Insightful and able to interpret abstract concepts, Normal speech and language skills Cognitive/Intellectual Conditions Management [RPT]: None reported or documented in medical history or problem list   Health Maintenance Behaviors: Annual physical exam, Healthy diet, Stress management, Social activities Health Facilitated by: Healthy diet, Pain control, Rest, Stress management  Neurological Neurological Review of Symptoms: No symptoms reported    HEENT HEENT Symptoms Reported: No symptoms reported      Cardiovascular Cardiovascular Symptoms Reported: No symptoms reported Does patient have uncontrolled Hypertension?: No Cardiovascular Conditions: Hypertension Cardiovascular Management Strategies: Medication therapy, Routine screening, Coping strategies, Fluid modification  Respiratory Respiratory Symptoms Reported: No symptoms reported Other Respiratory Symptoms: Denies shortness of breath; cough/ difficulty with breathing status; sounds to be in no distress throughout TOC call today Respiratory Conditions: Shortness of breath  Endocrine Patient reports the following symptoms related to hypoglycemia or hyperglycemia : No symptoms reported Is patient diabetic?: No Endocrine Conditions: Thyroid  disorder Endocrine Management Strategies: Medication therapy, Routine screening  Gastrointestinal Gastrointestinal Symptoms Reported: No symptoms reported Gastrointestinal Management Strategies: Coping strategies, Medication therapy, Diet modification Nutrition Risk Screen (CP): No indicators  present  Genitourinary Genitourinary Symptoms Reported: No symptoms reported Other Genitourinary Symptoms: Confirmed continues self administering hemodialysis at home: reports last hemodialysis on "Saturday night" (01/23/24)- states, "It went fine; no problems there, we are old-hats at doing this at home" Additional Genitourinary Details: Confirms next nephrology provider appointment scheduled 02/10/24- verbalizes plans to attend as scheduled Genitourinary Conditions: End-stage renal disease Genitourinary Management Strategies: Hemodialysis, Coping strategies, Adequate rest Hemodialysis Last Treatment: 01/23/24 Genitourinary Self-Management Outcome: 4 (good)  Integumentary Integumentary Symptoms Reported: No symptoms reported Additional Integumentary Details: Again reports existing hemodialysis port "looks fine, no problems" Skin Conditions: Other Other Skin Conditions: hemodialysis port Skin Management Strategies: Routine screening, Medical device  Musculoskeletal Musculoskelatal Symptoms Reviewed: No symptoms reported Additional Musculoskeletal Details: confirmed not currently requiring/ using assistive devices for ambulation        Psychosocial Psychosocial Symptoms Reported: No symptoms reported         There were no vitals filed for this visit.  Medications Reviewed Today     Reviewed by Rene Gonsoulin M, RN (Registered Nurse) on 01/25/24 at 1409  Med List Status: <None>   Medication Order Taking? Sig Documenting Provider Last Dose Status Informant  acetaminophen  (TYLENOL ) 500 MG tablet [id number] No Take 500 mg by mouth every 6 (six) hours as needed. [provider] Taking Active Self  acyclovir  (ZOVIRAX ) 200 MG capsule [id number] No Take 200 mg by mouth 2 (two) times daily as needed (out breaks). [provider] Taking Active Self  albuterol  (VENTOLIN  HFA) 108 (90 Base) MCG/ACT inhaler [id number]  INHALE 2 TO 4 PUFFS INTO THE LUNGS EVERY 4 HOURS AS NEEDED FOR  WHEEZING OR COUGH [name], [name], NP-C  Active   amLODipine  (NORVASC ) 10 MG tablet [id number] No Take 10 mg by mouth daily. Per [facility] discharge 12/24/23 [provider] Taking Active Self  ascorbic acid (VITAMIN C) 250 MG CHEW [id number] No Chew 250 mg by mouth daily. [provider] Taking Active Self  atomoxetine  (STRATTERA ) 10 MG capsule [id number] No Take 1 capsule (10 mg total) by mouth 2 (two) times daily with a meal. Arfeen, Bronson Canny, MD Taking Active Self           Med Note Drexel Gentles, Milda Aline   ZDG Jan 02, 2024  9:37 PM) On hold  carvedilol  (COREG ) 12.5 MG tablet [id number] No Take 12.5 mg by mouth 2 (two) times daily with a meal. [provider] Taking Active Self           Med Note Neila Bally, HEATHER L   Fri Nov 08, 2021 10:25 AM)    cinacalcet  (SENSIPAR ) 60 MG tablet [id number] No Take 60 mg by mouth at bedtime. [provider] Taking Active Self           Med Note Drexel Gentles, [facility]   Sat Jan 02, 2024  9:38 PM)    cyclobenzaprine (FLEXERIL) 10 MG tablet [id number] No Take 10 mg by mouth 2 (two) times daily as needed for muscle spasms (per [facility] discharged 12/24/23). [provider] Taking Active Self  diclofenac  Sodium (VOLTAREN ) 1 % GEL [id number] No Apply 1 application  topically 4 (four) times daily as needed (hand,feet,thighs and back pain). [provider] Taking Active Self           Med Note Carylon Claude, Kraig [place] Sep 25, 2023  3:40 PM)    EPINEPHrine  0.3  mg/0.3 mL IJ SOAJ injection [id number] No Inject 0.3 mg into the muscle as needed for anaphylaxis. [provider] Taking Active Self  epoetin  alfa (EPOGEN ) 20000 UNIT/ML injection [id number] No Inject 20,000 Units into the skin every 14 (fourteen) days. [provider] Taking Active Self  escitalopram  (LEXAPRO ) 10 MG tablet [id number] No Take 1 tablet (10 mg total) by mouth daily. Arfeen, Syed T, MD Taking Active Self           Med Note Drexel Gentles, [place]   YTK Jan 02, 2024   9:40 PM) On hold  ferric citrate  (AURYXIA ) 1 GM 210 MG(Fe) tablet [id number] No Take 420 mg by mouth in the morning, at noon, and at bedtime. [provider] Taking Active Self  furosemide  (LASIX ) 80 MG tablet [id number] No Take 80 mg by mouth daily as needed for edema. [provider] Taking Active Self  gabapentin  (NEURONTIN ) 100 MG capsule [id number] No Take 100 mg by mouth at bedtime. Per [facility] discharged 12/24/23 [provider] Taking Active Self  HYDROcodone -acetaminophen  (NORCO) 7.5-325 MG tablet [id number]  Take 1 tablet by mouth 5 (five) times daily as needed for pain stop percocet 7.5/325.   Active   HYDROcodone -acetaminophen  (NORCO/VICODIN) 5-325 MG tablet [id number] No Take 1 tablet by mouth 4 (four) times daily as needed for pain.  Taking Active Self  ipratropium-albuterol  (DUONEB) 0.5-2.5 (3) MG/3ML SOLN [id number]  USE 1 AMPULE IN NEBULIZER EVERY 6 HOURS AS NEEDED [name], [name], NP-C  Active   iron  sucrose (VENOFER ) 20 MG/ML injection [id number] No Inject 1,000 mg into the vein every 30 (thirty) days. [provider] Taking Active Self  levothyroxine  (SYNTHROID ) 200 MCG tablet [id number] No Take 200 mcg by mouth at bedtime. [provider] Taking Active Self  loratadine  (CLARITIN ) 10 MG tablet [id number]  Take 20 mg by mouth. [provider]  Active   oxyCODONE -acetaminophen  (PERCOCET) 7.5-325 MG tablet [id number] No Take 1 tablet by mouth every 4 (four) hours as needed for pain, stop hydrocodone  5/325.  Taking Active   tiZANidine  (ZANAFLEX ) 4 MG tablet [id number] No Take 1/2 tablet (2 mg total) by mouth 3 (three) times daily as needed.  Taking Active   Vitamin D, Ergocalciferol, (DRISDOL) 1.25 MG (50000 UNIT) CAPS capsule [id number] No Take 50,000 Units by mouth once a week. wednesday [provider] Taking Active Self           Recommendation:   Continue Current Plan of Care  Follow Up Plan:   Closing From:  Transitions of  Care Program  No further follow up required: Patient participated in TOC-30 day program and has met previously established goals; today denies need for ongoing care management outreach and confirms she has my direct phone number should needs arise in the future  Pls call/ message for questions,  Sady Monaco Mckinney Talayeh Bruinsma, RN, BSN, Media planner  Transitions of Care  [facility] - [facility] [phone]: direct office

## 2024-02-04 ENCOUNTER — Other Ambulatory Visit (HOSPITAL_COMMUNITY): Payer: Self-pay

## 2024-02-04 ENCOUNTER — Other Ambulatory Visit: Payer: Self-pay

## 2024-02-04 MED ORDER — HYDROCODONE-ACETAMINOPHEN 5-325 MG PO TABS
1.0000 | ORAL_TABLET | Freq: Every day | ORAL | 0 refills | Status: AC
  Filled 2024-02-04: qty 150, 30d supply, fill #0

## 2024-02-04 MED ORDER — HYDROCODONE-ACETAMINOPHEN 5-325 MG PO TABS
1.0000 | ORAL_TABLET | Freq: Every day | ORAL | 0 refills | Status: AC
  Filled 2024-03-04: qty 150, 30d supply, fill #0

## 2024-02-08 ENCOUNTER — Other Ambulatory Visit: Payer: Medicare Other

## 2024-02-08 ENCOUNTER — Ambulatory Visit: Payer: Medicare Other | Admitting: Internal Medicine

## 2024-03-01 ENCOUNTER — Other Ambulatory Visit (HOSPITAL_COMMUNITY): Payer: Self-pay

## 2024-03-04 ENCOUNTER — Other Ambulatory Visit: Payer: Self-pay

## 2024-03-04 ENCOUNTER — Other Ambulatory Visit (HOSPITAL_COMMUNITY): Payer: Self-pay

## 2024-03-15 ENCOUNTER — Other Ambulatory Visit (HOSPITAL_COMMUNITY): Payer: Self-pay

## 2024-03-17 ENCOUNTER — Inpatient Hospital Stay: Payer: Medicare Other | Admitting: Internal Medicine

## 2024-03-17 ENCOUNTER — Inpatient Hospital Stay: Payer: Medicare Other | Attending: Internal Medicine

## 2024-03-30 ENCOUNTER — Other Ambulatory Visit: Payer: Self-pay | Admitting: Family Medicine

## 2024-04-04 ENCOUNTER — Other Ambulatory Visit (HOSPITAL_COMMUNITY): Payer: Self-pay

## 2024-04-04 MED ORDER — HYDROCODONE-ACETAMINOPHEN 5-325 MG PO TABS
1.0000 | ORAL_TABLET | Freq: Every day | ORAL | 0 refills | Status: AC | PRN
  Filled 2024-05-03: qty 150, 30d supply, fill #0

## 2024-04-04 MED ORDER — HYDROCODONE-ACETAMINOPHEN 5-325 MG PO TABS
1.0000 | ORAL_TABLET | Freq: Every day | ORAL | 0 refills | Status: AC | PRN
  Filled 2024-04-04: qty 150, 30d supply, fill #0

## 2024-04-28 ENCOUNTER — Other Ambulatory Visit: Payer: Self-pay | Admitting: Family Medicine

## 2024-05-03 ENCOUNTER — Other Ambulatory Visit (HOSPITAL_COMMUNITY): Payer: Self-pay

## 2024-05-30 ENCOUNTER — Other Ambulatory Visit (HOSPITAL_COMMUNITY): Payer: Self-pay

## 2024-05-30 MED ORDER — HYDROCODONE-ACETAMINOPHEN 5-325 MG PO TABS
1.0000 | ORAL_TABLET | Freq: Every day | ORAL | 0 refills | Status: AC
  Filled 2024-05-30 – 2024-05-31 (×2): qty 150, 30d supply, fill #0

## 2024-05-31 ENCOUNTER — Other Ambulatory Visit (HOSPITAL_COMMUNITY): Payer: Self-pay

## 2024-06-07 ENCOUNTER — Telehealth: Payer: Self-pay

## 2024-06-07 ENCOUNTER — Ambulatory Visit: Payer: Self-pay | Admitting: Family Medicine

## 2024-06-07 ENCOUNTER — Ambulatory Visit (INDEPENDENT_AMBULATORY_CARE_PROVIDER_SITE_OTHER)

## 2024-06-07 ENCOUNTER — Ambulatory Visit: Admitting: Family Medicine

## 2024-06-07 VITALS — BP 110/64 | HR 100 | Temp 97.8°F | Ht 67.0 in | Wt 216.0 lb

## 2024-06-07 DIAGNOSIS — Z8781 Personal history of (healed) traumatic fracture: Secondary | ICD-10-CM

## 2024-06-07 DIAGNOSIS — Z992 Dependence on renal dialysis: Secondary | ICD-10-CM

## 2024-06-07 DIAGNOSIS — N186 End stage renal disease: Secondary | ICD-10-CM

## 2024-06-07 DIAGNOSIS — I10 Essential (primary) hypertension: Secondary | ICD-10-CM | POA: Diagnosis not present

## 2024-06-07 DIAGNOSIS — J4540 Moderate persistent asthma, uncomplicated: Secondary | ICD-10-CM

## 2024-06-07 DIAGNOSIS — G4733 Obstructive sleep apnea (adult) (pediatric): Secondary | ICD-10-CM

## 2024-06-07 DIAGNOSIS — D631 Anemia in chronic kidney disease: Secondary | ICD-10-CM

## 2024-06-07 DIAGNOSIS — R053 Chronic cough: Secondary | ICD-10-CM

## 2024-06-07 DIAGNOSIS — E039 Hypothyroidism, unspecified: Secondary | ICD-10-CM

## 2024-06-07 DIAGNOSIS — E875 Hyperkalemia: Secondary | ICD-10-CM

## 2024-06-07 LAB — CBC WITH DIFFERENTIAL/PLATELET
Basophils Absolute: 0.1 K/uL (ref 0.0–0.1)
Basophils Relative: 0.8 % (ref 0.0–3.0)
Eosinophils Absolute: 0.4 K/uL (ref 0.0–0.7)
Eosinophils Relative: 5.3 % — ABNORMAL HIGH (ref 0.0–5.0)
HCT: 29.6 % — ABNORMAL LOW (ref 36.0–46.0)
Hemoglobin: 9.7 g/dL — ABNORMAL LOW (ref 12.0–15.0)
Lymphocytes Relative: 9.4 % — ABNORMAL LOW (ref 12.0–46.0)
Lymphs Abs: 0.6 K/uL — ABNORMAL LOW (ref 0.7–4.0)
MCHC: 32.9 g/dL (ref 30.0–36.0)
MCV: 85 fl (ref 78.0–100.0)
Monocytes Absolute: 0.6 K/uL (ref 0.1–1.0)
Monocytes Relative: 8.9 % (ref 3.0–12.0)
Neutro Abs: 5.2 K/uL (ref 1.4–7.7)
Neutrophils Relative %: 75.6 % (ref 43.0–77.0)
Platelets: 226 K/uL (ref 150.0–400.0)
RBC: 3.48 Mil/uL — ABNORMAL LOW (ref 3.87–5.11)
RDW: 17 % — ABNORMAL HIGH (ref 11.5–15.5)
WBC: 6.9 K/uL (ref 4.0–10.5)

## 2024-06-07 LAB — BASIC METABOLIC PANEL WITH GFR
BUN: 40 mg/dL — ABNORMAL HIGH (ref 6–23)
CO2: 24 meq/L (ref 19–32)
Calcium: 8.7 mg/dL (ref 8.4–10.5)
Chloride: 97 meq/L (ref 96–112)
Creatinine, Ser: 11.23 mg/dL (ref 0.40–1.20)
GFR: 3.99 mL/min — CL (ref >60.00)
Glucose, Bld: 116 mg/dL — ABNORMAL HIGH (ref 70–99)
Potassium: 4.3 meq/L (ref 3.5–5.1)
Sodium: 134 meq/L — ABNORMAL LOW (ref 135–145)

## 2024-06-07 LAB — TSH: TSH: 9.51 u[IU]/mL — ABNORMAL HIGH (ref 0.35–5.50)

## 2024-06-07 NOTE — Telephone Encounter (Signed)
 CRITICAL VALUE STICKER  CRITICAL VALUE: creatinine 11.23 and GFR 3.99  RECEIVER (on-site recipient of call): Chiquita  DATE & TIME NOTIFIED: 06/07/24 at 2:40 pm  MESSENGER (representative from lab): [email or web address]

## 2024-06-07 NOTE — Progress Notes (Signed)
 Subjective:     Patient ID: Holly [name], female    DOB: [birth year], 38 y.o.   MRN: [id number]  Chief Complaint  Patient presents with   Follow-up    Still having pain in sternum and ribs when breathing deeply    HPI  Discussed the use of AI scribe software for clinical note transcription with the patient, who gave verbal consent to proceed.  History of Present Illness Holly [name] is a 38 year old female with chronic kidney disease on dialysis who presents with shortness of breath and chronic cough.  Dyspnea - Worsening shortness of breath since hospitalization in April - Becomes winded more easily, especially during activities that increase intrathoracic pressure such as sneezing or taking deep breaths - Previously able to walk 1-2 miles without difficulty; now struggles with activities such as shopping - Symptoms managed with nebulizer - No recent evaluation by pulmonology since adolescence  Cough and wheezing - Chronic cough, occasionally productive of mucus - Associated wheezing and sensation of chest tightness - Symptoms more pronounced with exertion; feels well at rest  Obstructive sleep apnea - Sleep study two years ago indicated very mild obstructive sleep apnea - Does not use CPAP therapy  End-stage renal disease on dialysis - On dialysis seven days a week due to non-existent kidney function - Body overproduces fibrin, causing issues with dialysis catheters - No issues with fluid retention; fluid levels well-controlled with daily dialysis - No current swelling  Anemia - Hemoglobin levels have fluctuated; most recent value 9.8 in September - Epogen  not delivered last month, which may affect hemoglobin levels  Hypothyroidism - Takes levothyroxine  225 mcg daily - Thyroid  function monitored every three months  Hypertension - Blood pressure managed with amlodipine  10 mg and carvedilol  - Lasix  discontinued due to  anuria  Tobacco use - Former smoker     Health Maintenance Due  Topic Date Due   DTaP/Tdap/Td (1 - Tdap) Never done   Pneumococcal Vaccine (1 of 2 - PCV) 03/08/2007   HPV VACCINES (1 - 3-dose SCDM series) Never done   Influenza Vaccine  03/25/2024   COVID-19 Vaccine (9 - 2025-26 season) 04/25/2024    Past Medical History:  Diagnosis Date   Anemia    Anxiety    Arthritis    hands   Asthmatic bronchitis 02/11/2023   Blood transfusion without reported diagnosis    Cancer (HCC)    Complication of anesthesia    with Fentanyl  she is difficult to awake   Depression    Dyspnea    due to anemia   Epilepsy (HCC)    ESRD on hemodialysis (HCC)    home HD 3 hrs, 5 days per wk   Hashimoto's disease    History of kidney stones    HLD (hyperlipidemia)    Hypertension    Hypothyroidism    Membranoproliferative glomerulonephritis type 2, idiopathic    Pneumonia    Thyroid  disease     Past Surgical History:  Procedure Laterality Date   A/V FISTULAGRAM Left 09/09/2022   Procedure: A/V Fistulagram;  Surgeon: Jama Cordella MATSU, MD;  Location: [facility];  Service: Cardiovascular;  Laterality: Left;   ABDOMINAL HYSTERECTOMY  06/24/2022   ABDOMINAL SURGERY     AV FISTULA PLACEMENT Left 01/17/2022   Procedure: ARTERIOVENOUS (AV) FISTULA CREATION ( RADIAL CEPHALIC);  Surgeon: Jama Cordella MATSU, MD;  Location: [facility] OR[facility];  Service: Vascular;  Laterality: Left;   CERVICAL BIOPSY  W/ LOOP ELECTRODE  EXCISION  2018   COLONOSCOPY N/A 01/30/2022   Procedure: COLONOSCOPY;  Surgeon: Onita Elspeth Sharper, DO;  Location: [facility] ENDOSCOPY;  Service: Gastroenterology;  Laterality: N/A;   COLONOSCOPY WITH PROPOFOL  N/A 02/01/2022   Procedure: COLONOSCOPY WITH PROPOFOL ;  Surgeon: Maryruth Ole DASEN, MD;  Location: [facility] ENDOSCOPY;  Service: Endoscopy;  Laterality: N/A;   DIAGNOSTIC LAPAROSCOPY     due to removal of PD catheters   DIALYSIS/PERMA CATHETER INSERTION N/A 06/09/2022    Procedure: DIALYSIS/PERMA CATHETER INSERTION;  Surgeon: Marea Selinda RAMAN, MD;  Location: [facility];  Service: Cardiovascular;  Laterality: N/A;   DIALYSIS/PERMA CATHETER INSERTION N/A 06/30/2022   Procedure: DIALYSIS/PERMA CATHETER INSERTION;  Surgeon: Gretta Lonni PARAS, MD;  Location: [facility];  Service: Cardiovascular;  Laterality: N/A;   DIALYSIS/PERMA CATHETER INSERTION N/A 09/28/2023   Procedure: DIALYSIS/PERMA CATHETER INSERTION;  Surgeon: Norine Manuelita LABOR, MD;  Location: [facility];  Service: Cardiovascular;  Laterality: N/A;   DIALYSIS/PERMA CATHETER INSERTION N/A 09/30/2023   Procedure: DIALYSIS/PERMA CATHETER INSERTION;  Surgeon: Melia Lynwood ORN, MD;  Location: [facility] INVASIVE CV LAB;  Service: Cardiovascular;  Laterality: N/A;   DIALYSIS/PERMA CATHETER INSERTION N/A 10/23/2023   Procedure: DIALYSIS/PERMA CATHETER INSERTION;  Surgeon: Tobie Gordy POUR, MD;  Location: [facility] INVASIVE CV LAB;  Service: Cardiovascular;  Laterality: N/A;   DIALYSIS/PERMA CATHETER REMOVAL N/A 09/28/2023   Procedure: DIALYSIS/PERMA CATHETER REMOVAL;  Surgeon: Norine Manuelita LABOR, MD;  Location: [facility];  Service: Cardiovascular;  Laterality: N/A;   DIALYSIS/PERMA CATHETER REMOVAL N/A 10/23/2023   Procedure: DIALYSIS/PERMA CATHETER REMOVAL;  Surgeon: Tobie Gordy POUR, MD;  Location: [facility] INVASIVE CV LAB;  Service: Cardiovascular;  Laterality: N/A;   ESOPHAGOGASTRODUODENOSCOPY N/A 01/30/2022   Procedure: ESOPHAGOGASTRODUODENOSCOPY (EGD);  Surgeon: Onita Elspeth Sharper, DO;  Location: [facility] ENDOSCOPY;  Service: Gastroenterology;  Laterality: N/A;   ESOPHAGOGASTRODUODENOSCOPY (EGD) WITH PROPOFOL  N/A 02/01/2022   Procedure: ESOPHAGOGASTRODUODENOSCOPY (EGD) WITH PROPOFOL ;  Surgeon: Maryruth Ole DASEN, MD;  Location: [facility] ENDOSCOPY;  Service: Endoscopy;  Laterality: N/A;   IR FLUORO GUIDE CV LINE RIGHT  10/24/2023   IR REMOVAL TUN CV CATH W/O FL  10/24/2023   IR US  GUIDE VASC ACCESS RIGHT   10/24/2023   PERIPHERAL VASCULAR BALLOON ANGIOPLASTY Right 09/28/2023   Procedure: PERIPHERAL VASCULAR BALLOON ANGIOPLASTY;  Surgeon: Norine Manuelita LABOR, MD;  Location: [facility];  Service: Cardiovascular;  Laterality: Right;  innominate sheath   RENAL BIOPSY     x 2   TUMOR REMOVAL     right face   UTERINE ARTERY EMBOLIZATION  05/04/2022    Family History  Problem Relation Age of Onset   Multiple sclerosis Mother    Cancer Father    COPD Father     Social History   Socioeconomic History   Marital status: Divorced    Spouse name: Elsie   Number of children: Not on file   Years of education: Not on file   Highest education level: Some college, no degree  Occupational History   Occupation: [employer]  Tobacco Use   Smoking status: Former    Current packs/day: 0.00    Types: Cigarettes    Quit date: 2023    Years since quitting: 2.7   Smokeless tobacco: Never  Vaping Use   Vaping status: Never Used  Substance and Sexual Activity   Alcohol use: Never   Drug use: Never   Sexual activity: Not Currently  Other Topics Concern   Not on file  Social History Narrative  Lives with Elsie, significant other. One pet, cat.    Social Drivers of Health   Financial Resource Strain: Medium Risk (06/07/2024)   Overall Financial Resource Strain (CARDIA)    Difficulty of Paying Living Expenses: Somewhat hard  Food Insecurity: Unknown (06/07/2024)   Hunger Vital Sign    Worried About Running Out of Food in the Last Year: Not on file    Ran Out of Food in the Last Year: Never true  Transportation Needs: Unmet Transportation Needs (06/07/2024)   PRAPARE - Administrator, Civil Service (Medical): Yes    Lack of Transportation (Non-Medical): No  Physical Activity: Sufficiently Active (06/07/2024)   Exercise Vital Sign    Days of Exercise per Week: 4 days    Minutes of Exercise per Session: 40 min  Stress: Stress Concern Present (06/07/2024)   [employer]  of Occupational Health - Occupational Stress Questionnaire    Feeling of Stress: Rather much  Social Connections: Moderately Isolated (06/07/2024)   Social Connection and Isolation Panel    Frequency of Communication with Friends and Family: Twice a week    Frequency of Social Gatherings with Friends and Family: Twice a week    Attends Religious Services: Never    Database administrator or Organizations: No    Attends Engineer, structural: Not on file    Marital Status: Living with partner  Intimate Partner Violence: Not At Risk (12/25/2023)   Humiliation, Afraid, Rape, and Kick questionnaire    Fear of Current or Ex-Partner: No    Emotionally Abused: No    Physically Abused: No    Sexually Abused: No    Outpatient Medications Prior to Visit  Medication Sig Dispense Refill   acetaminophen  (TYLENOL ) 500 MG tablet Take 500 mg by mouth every 6 (six) hours as needed.     acyclovir  (ZOVIRAX ) 200 MG capsule Take 200 mg by mouth 2 (two) times daily as needed (out breaks).     albuterol  (VENTOLIN  HFA) 108 (90 Base) MCG/ACT inhaler INHALE 2 TO 4 PUFFS INTO THE LUNGS EVERY 4 HOURS AS NEEDED FOR WHEEZING OR COUGH 18 g 1   amLODipine  (NORVASC ) 10 MG tablet Take 10 mg by mouth daily. Per [facility] discharge 12/24/23     ascorbic acid (VITAMIN C) 250 MG CHEW Chew 250 mg by mouth daily.     atomoxetine  (STRATTERA ) 10 MG capsule Take 1 capsule (10 mg total) by mouth 2 (two) times daily with a meal. 60 capsule 2   carvedilol  (COREG ) 12.5 MG tablet Take 12.5 mg by mouth 2 (two) times daily with a meal.     cinacalcet  (SENSIPAR ) 60 MG tablet Take 60 mg by mouth at bedtime.     diclofenac  Sodium (VOLTAREN ) 1 % GEL Apply 1 application  topically 4 (four) times daily as needed (hand,feet,thighs and back pain).     EPINEPHrine  0.3 mg/0.3 mL IJ SOAJ injection Inject 0.3 mg into the muscle as needed for anaphylaxis.     epoetin  alfa (EPOGEN ) 20000 UNIT/ML injection Inject 20,000 Units into the skin every 14  (fourteen) days.     ferric citrate  (AURYXIA ) 1 GM 210 MG(Fe) tablet Take 420 mg by mouth in the morning, at noon, and at bedtime.     gabapentin  (NEURONTIN ) 100 MG capsule Take 100 mg by mouth at bedtime. Per [facility] discharged 12/24/23     HYDROcodone -acetaminophen  (NORCO) 7.5-325 MG tablet Take 1 tablet by mouth 5 (five) times daily as needed for pain stop percocet  7.5/325. 100 tablet 0   HYDROcodone -acetaminophen  (NORCO/VICODIN) 5-325 MG tablet Take 1 tablet by mouth 5 (five) times daily as needed for pain 150 tablet 0   HYDROcodone -acetaminophen  (NORCO/VICODIN) 5-325 MG tablet Take 1 tablet by mouth 5 (five) times daily as needed for pain. 150 tablet 0   HYDROcodone -acetaminophen  (NORCO/VICODIN) 5-325 MG tablet Take 1 tablet by mouth 5 (five) times daily as needed for pain. 150 tablet 0   HYDROcodone -acetaminophen  (NORCO/VICODIN) 5-325 MG tablet Take 1 tablet by mouth 5 (five) times daily as needed for pain. 150 tablet 0   HYDROcodone -acetaminophen  (NORCO/VICODIN) 5-325 MG tablet Take 1 tablet by mouth 5 (five) times daily as needed for pain. 150 tablet 0   ipratropium-albuterol  (DUONEB) 0.5-2.5 (3) MG/3ML SOLN USE 1 AMPULE IN NEBULIZER EVERY 6 HOURS AS NEEDED 360 mL 0   iron  sucrose (VENOFER ) 20 MG/ML injection Inject 1,000 mg into the vein every 30 (thirty) days.     levothyroxine  (SYNTHROID ) 200 MCG tablet Take 200 mcg by mouth at bedtime.     loratadine  (CLARITIN ) 10 MG tablet Take 20 mg by mouth.     oxyCODONE -acetaminophen  (PERCOCET) 7.5-325 MG tablet Take 1 tablet by mouth every 4 (four) hours as needed for pain, stop hydrocodone  5/325. 90 tablet 0   tiZANidine  (ZANAFLEX ) 4 MG tablet Take 1/2 tablet (2 mg total) by mouth 3 (three) times daily as needed. 45 tablet 0   Vitamin D, Ergocalciferol, (DRISDOL) 1.25 MG (50000 UNIT) CAPS capsule Take 50,000 Units by mouth once a week. wednesday     furosemide  (LASIX ) 80 MG tablet Take 80 mg by mouth daily as needed for edema.      HYDROcodone -acetaminophen  (NORCO/VICODIN) 5-325 MG tablet Take 1 tablet by mouth 4 (four) times daily as needed for pain. 120 tablet 0   cyclobenzaprine (FLEXERIL) 10 MG tablet Take 10 mg by mouth 2 (two) times daily as needed for muscle spasms (per [facility] discharged 12/24/23).     escitalopram  (LEXAPRO ) 10 MG tablet Take 1 tablet (10 mg total) by mouth daily. 90 tablet 0   No facility-administered medications prior to visit.    Allergies  Allergen Reactions   Ancef  [Cefazolin ] Anaphylaxis    Patient reports 12/11/23 hospitalization   Ciprofloxacin Anaphylaxis and Other (See Comments)    Caused Seizures   Seizures   Other reaction(s): Other (See Comments)  Seizures  Seizures     Caused Seizures  Seizures  Seizures  Other reaction(s): Other (See Comments) Seizures  Seizures  Seizures     Other reaction(s): Other (See Comments) Seizures  Seizures  Seizures     Caused Seizures  Caused Seizures  Seizures  Seizures  Other reaction(s): Other (See Comments) Seizures  Seizures  Seizures    Seizures Grand mal   Levetiracetam Anaphylaxis and Other (See Comments)    Medical Record H&P (unknown reaction)  Medical Record H&P  Other reaction(s): Other (See Comments)   Oxycodone -Acetaminophen  Nausea Only and Other (See Comments)   Rituximab Anaphylaxis and Swelling   Tramadol Anaphylaxis and Other (See Comments)    Caused seizures  Other reaction(s): Other (See Comments)  Medical Record H&P  Medical Record H&P    Caused seizures  Other Reaction(s): Other (See Comments)  Caused seizures  Medical Record H&P  Medical Record H&P  Other reaction(s): seizures  Medical Record H&P  Medical Record H&P  Medical Record H&P   Chlorhexidine  Other (See Comments)    Pt states skin becomes irritated and blisters    Review of Systems  Constitutional:  Negative for chills and fever.  Respiratory:  Positive for cough, shortness of breath and wheezing. Negative for hemoptysis and sputum  production.   Cardiovascular:  Negative for chest pain, palpitations, orthopnea, leg swelling and PND.       Chest wall pain with movements   Gastrointestinal:  Negative for abdominal pain, constipation, diarrhea, nausea and vomiting.  Genitourinary:  Negative for dysuria, frequency and urgency.  Neurological:  Negative for dizziness.  Psychiatric/Behavioral:  Negative for depression.        Objective:    Physical Exam Constitutional:      General: She is not in acute distress.    Appearance: She is not ill-appearing.  HENT:     Mouth/Throat:     Mouth: Mucous membranes are moist.     Pharynx: Oropharynx is clear.  Eyes:     Extraocular Movements: Extraocular movements intact.     Conjunctiva/sclera: Conjunctivae normal.     Pupils: Pupils are equal, round, and reactive to light.  Cardiovascular:     Rate and Rhythm: Normal rate and regular rhythm.  Pulmonary:     Effort: Pulmonary effort is normal.     Breath sounds: Normal breath sounds.  Chest:     Comments: Mild TTP to sternum  Musculoskeletal:     Cervical back: Normal range of motion and neck supple.     Right lower leg: No edema.     Left lower leg: No edema.  Skin:    General: Skin is warm and dry.  Neurological:     General: No focal deficit present.     Mental Status: She is alert and oriented to person, place, and time.     Motor: No weakness.     Coordination: Coordination normal.     Gait: Gait normal.  Psychiatric:        Mood and Affect: Mood normal.        Behavior: Behavior normal.        Thought Content: Thought content normal.      BP 110/64   Pulse 100   Temp 97.8 F (36.6 C) (Temporal)   Ht 5' 7 (1.702 m)   Wt 216 lb (98 kg)   LMP 03/31/2022 (Exact Date) Comment: pt bleeding last 6 weeks with recent multiple transfusions  SpO2 98%   BMI 33.83 kg/m  Wt Readings from Last 3 Encounters:  06/07/24 216 lb (98 kg)  01/08/24 209 lb (94.8 kg)  01/02/24 213 lb (96.6 kg)       Assessment  & Plan:   Problem List Items Addressed This Visit     ESRD on dialysis (HCC) (Chronic)   Relevant Orders   DG Chest 2 View (Completed)   Essential hypertension (Chronic)   Relevant Orders   CBC with Differential/Platelet (Completed)   Basic metabolic panel with GFR (Completed)   Hypothyroidism (Chronic)   Relevant Orders   TSH (Completed)   Anemia due to end stage renal disease (HCC)   Hyperkalemia   Other Visit Diagnoses       Moderate persistent asthma, unspecified whether complicated    -  Primary   Relevant Orders   Ambulatory referral to Pulmonology   DG Chest 2 View (Completed)     History of rib fracture       Relevant Orders   DG Chest 2 View (Completed)     OSA (obstructive sleep apnea)       Relevant Orders   Ambulatory referral to Pulmonology  Chronic cough       Relevant Orders   DG Chest 2 View (Completed)       Assessment and Plan Assessment & Plan End-stage renal disease on hemodialysis with anemia of chronic kidney disease End-stage renal disease with ongoing hemodialysis. Anemia of chronic kidney disease with recent hemoglobin levels of 9.8. Issues with fibrin overproduction affecting dialysis catheters. No recent Epogen  administration due to supply issues. Dialysis performed seven days a week at home to manage fibrin production. - Continue hemodialysis seven days a week at home. - Monitor hemoglobin levels regularly. - Will have labs tomorrow per nephrologist including iron  levels  - Hopeful to get on transplant list    Shortness of breath and chronic cough Increased shortness of breath and chronic cough with occasional mucus production. History of asthma and previous mild obstructive sleep apnea. Shortness of breath limits activities such as walking long distances and shopping. - Refer to pulmonology for further evaluation including PFTs - Order chest X-ray.  Closed multiple rib fractures, healing Healing rib fractures from April when she  underwent cardiopulmonary resuscitation . Pain persists with activities that increase pressure, such as sneezing or deep breathing. She can perform daily activities but experiences discomfort with increased pressure. - Manage pain with analgesics as needed.  Obstructive sleep apnea, mild Mild obstructive sleep apnea diagnosed two years ago. No current CPAP use due to discomfort with full mask. Interested in exploring alternative CPAP options. - Consider alternative CPAP options such as nasal pillows. - Follow up on previous sleep study results.  Hypothyroidism Hypothyroidism managed with levothyroxine . Current dose is 225 mcg. Thyroid  function monitored every three months. - Check TSH levels. - Continue current levothyroxine  dosage.  Hypertension Hypertension managed with carvedilol . Blood pressure well-controlled at current visit. - Verify medication list for any changes.  Anxiety and depression Ongoing management of anxiety and depression. Missed recent appointment with psychiatrist. - Reschedule appointment with psychiatrist. - Discuss potential side effects of anti-rejection medications on mood.  General Health Maintenance Routine health maintenance discussed. She receives flu vaccinations at the clinic.      I have discontinued Ronal CANDIE Hiss escitalopram , furosemide , and cyclobenzaprine. I am also having her maintain her carvedilol , cinacalcet , levothyroxine , acyclovir , diclofenac  Sodium, epoetin  alfa, iron  sucrose, atomoxetine , Vitamin D (Ergocalciferol), ferric citrate , amLODipine , ascorbic acid, gabapentin , acetaminophen , EPINEPHrine , HYDROcodone -acetaminophen , albuterol , oxyCODONE -acetaminophen , tiZANidine , loratadine , HYDROcodone -acetaminophen , HYDROcodone -acetaminophen , HYDROcodone -acetaminophen , HYDROcodone -acetaminophen , HYDROcodone -acetaminophen , ipratropium-albuterol , and HYDROcodone -acetaminophen .  No orders of the defined types were placed in this encounter.

## 2024-06-10 ENCOUNTER — Ambulatory Visit: Admitting: Pulmonary Disease

## 2024-06-10 ENCOUNTER — Encounter: Payer: Self-pay | Admitting: Pulmonary Disease

## 2024-06-10 VITALS — BP 109/74 | HR 106 | Temp 98.9°F | Ht 67.0 in | Wt 221.0 lb

## 2024-06-10 DIAGNOSIS — G4733 Obstructive sleep apnea (adult) (pediatric): Secondary | ICD-10-CM | POA: Diagnosis not present

## 2024-06-10 DIAGNOSIS — D638 Anemia in other chronic diseases classified elsewhere: Secondary | ICD-10-CM

## 2024-06-10 DIAGNOSIS — F419 Anxiety disorder, unspecified: Secondary | ICD-10-CM | POA: Diagnosis not present

## 2024-06-10 DIAGNOSIS — F32A Depression, unspecified: Secondary | ICD-10-CM

## 2024-06-10 DIAGNOSIS — R5382 Chronic fatigue, unspecified: Secondary | ICD-10-CM

## 2024-06-10 NOTE — Patient Instructions (Addendum)
 History of mild sleep apnea with intolerance to CPAP previously - Focus should be on weight loss, regular exercises - Some weight loss will help -Myofunctional exercises will help the strength of the tongue and may reduce snoring, mild sleep apnea -Options of treatment including an oral device, surgical options only if you have sleep apnea gets worse  If you are able to lose some weight, this should improve  For shortness of breath and fatigue - Graded exercises as tolerated  Changes on your chest x-ray are not concerning as it may be related to just fluid changes especially with dialysis and fluid management  I will see you back in about 3 months  Make sure you completely get off cigarettes  Make sure you are exercising regularly  Try and keep a strict sleep routine as best as you can  Call us  with significant concerns

## 2024-06-10 NOTE — Progress Notes (Signed)
 Holly [name]    [id number]    Sep 06, 1985  Primary Care Physician:[name], Boby CROME, NP-C  Referring Physician: Lendia Boby CROME, NP-C [address] [place],  [place] [address]  Chief complaint:   Patient being seen for sleep apnea  HPI:  Patient with a past history of mild obstructive sleep apnea with an AHI of 6.8 O2 nadir of 87% Did try CPAP-CPAP was discontinued after weight loss, hoses and mask getting tangled all the time  Usually goes to bed between 11 and 2 AM, takes about 1 to 1-1/2 hours to fall asleep 2-4 awakenings Final wake up time between 8 and 9 AM  Weight is up about 20 pounds  She does have a history of hypertension, allergy sinus problems, chronic kidney disease for which she is on dialysis  Admits to occasional headache, nasal congestion No dryness of her mouth No memory issues Focus is fair, does have a history of ADHD  Did have an episode of anaphylactic shock requiring ventilation in April of this year, admitted and treated at [facility] Medication allergies including to Rituxan, Ancef , Cipro-life-threatening anaphylaxis  Was a reformed smoker but with the stressors of recent illness, went back to smoking - She is trying to quit  Exercise tolerance is limited by fatigue  She does have some pain and discomfort from having broken ribs from undergoing CPR  She does office work  Has a history of thyroid  problems which has been treated She is also anemic - This may be contributing to her fatigue End-stage renal disease on dialysis  Outpatient Encounter Medications as of 06/10/2024  Medication Sig   acetaminophen  (TYLENOL ) 500 MG tablet Take 500 mg by mouth every 6 (six) hours as needed.   acyclovir  (ZOVIRAX ) 200 MG capsule Take 200 mg by mouth 2 (two) times daily as needed (out breaks).   albuterol  (VENTOLIN  HFA) 108 (90 Base) MCG/ACT inhaler INHALE 2 TO 4 PUFFS INTO THE LUNGS EVERY 4 HOURS AS NEEDED FOR WHEEZING OR COUGH    amLODipine  (NORVASC ) 10 MG tablet Take 10 mg by mouth daily. Per [facility] discharge 12/24/23   ascorbic acid (VITAMIN C) 250 MG CHEW Chew 250 mg by mouth daily.   atomoxetine  (STRATTERA ) 10 MG capsule Take 1 capsule (10 mg total) by mouth 2 (two) times daily with a meal.   carvedilol  (COREG ) 12.5 MG tablet Take 12.5 mg by mouth 2 (two) times daily with a meal.   cinacalcet  (SENSIPAR ) 60 MG tablet Take 60 mg by mouth at bedtime.   diclofenac  Sodium (VOLTAREN ) 1 % GEL Apply 1 application  topically 4 (four) times daily as needed (hand,feet,thighs and back pain).   EPINEPHrine  0.3 mg/0.3 mL IJ SOAJ injection Inject 0.3 mg into the muscle as needed for anaphylaxis.   epoetin  alfa (EPOGEN ) 20000 UNIT/ML injection Inject 20,000 Units into the skin every 14 (fourteen) days.   ferric citrate  (AURYXIA ) 1 GM 210 MG(Fe) tablet Take 420 mg by mouth in the morning, at noon, and at bedtime.   gabapentin  (NEURONTIN ) 100 MG capsule Take 100 mg by mouth at bedtime. Per [facility] discharged 12/24/23   HYDROcodone -acetaminophen  (NORCO) 7.5-325 MG tablet Take 1 tablet by mouth 5 (five) times daily as needed for pain stop percocet 7.5/325.   HYDROcodone -acetaminophen  (NORCO/VICODIN) 5-325 MG tablet Take 1 tablet by mouth 4 (four) times daily as needed for pain.   HYDROcodone -acetaminophen  (NORCO/VICODIN) 5-325 MG tablet Take 1 tablet by mouth 5 (five) times daily as  needed for pain   HYDROcodone -acetaminophen  (NORCO/VICODIN) 5-325 MG tablet Take 1 tablet by mouth 5 (five) times daily as needed for pain.   HYDROcodone -acetaminophen  (NORCO/VICODIN) 5-325 MG tablet Take 1 tablet by mouth 5 (five) times daily as needed for pain.   HYDROcodone -acetaminophen  (NORCO/VICODIN) 5-325 MG tablet Take 1 tablet by mouth 5 (five) times daily as needed for pain.   HYDROcodone -acetaminophen  (NORCO/VICODIN) 5-325 MG tablet Take 1 tablet by mouth 5 (five) times daily as needed for pain.   ipratropium-albuterol  (DUONEB) 0.5-2.5 (3) MG/3ML SOLN USE 1  AMPULE IN NEBULIZER EVERY 6 HOURS AS NEEDED   iron  sucrose (VENOFER ) 20 MG/ML injection Inject 1,000 mg into the vein every 30 (thirty) days.   levothyroxine  (SYNTHROID ) 200 MCG tablet Take 200 mcg by mouth at bedtime.   loratadine  (CLARITIN ) 10 MG tablet Take 20 mg by mouth.   oxyCODONE -acetaminophen  (PERCOCET) 7.5-325 MG tablet Take 1 tablet by mouth every 4 (four) hours as needed for pain, stop hydrocodone  5/325.   tiZANidine  (ZANAFLEX ) 4 MG tablet Take 1/2 tablet (2 mg total) by mouth 3 (three) times daily as needed.   Vitamin D, Ergocalciferol, (DRISDOL) 1.25 MG (50000 UNIT) CAPS capsule Take 50,000 Units by mouth once a week. wednesday   No facility-administered encounter medications on file as of 06/10/2024.    Allergies as of 06/10/2024 - Review Complete 06/07/2024  Allergen Reaction Noted   Ancef  [cefazolin ] Anaphylaxis 12/25/2023   Ciprofloxacin Anaphylaxis and Other (See Comments) 08/10/2014   Levetiracetam Anaphylaxis and Other (See Comments) 11/08/2018   Oxycodone -acetaminophen  Nausea Only and Other (See Comments) 11/08/2018   Rituximab Anaphylaxis and Swelling 11/03/2018   Tramadol Anaphylaxis and Other (See Comments) 11/08/2018   Chlorhexidine  Other (See Comments) 12/24/2023    Past Medical History:  Diagnosis Date   Anemia    Anxiety    Arthritis    hands   Asthmatic bronchitis 02/11/2023   Blood transfusion without reported diagnosis    Cancer (HCC)    Complication of anesthesia    with Fentanyl  she is difficult to awake   Depression    Dyspnea    due to anemia   Epilepsy (HCC)    ESRD on hemodialysis (HCC)    home HD 3 hrs, 5 days per wk   Hashimoto's disease    History of kidney stones    HLD (hyperlipidemia)    Hypertension    Hypothyroidism    Membranoproliferative glomerulonephritis type 2, idiopathic    Pneumonia    Thyroid  disease     Past Surgical History:  Procedure Laterality Date   A/V FISTULAGRAM Left 09/09/2022   Procedure: A/V  Fistulagram;  Surgeon: Jama Cordella MATSU, MD;  Location: [facility] INVASIVE CV LAB;  Service: Cardiovascular;  Laterality: Left;   ABDOMINAL HYSTERECTOMY  06/24/2022   ABDOMINAL SURGERY     AV FISTULA PLACEMENT Left 01/17/2022   Procedure: ARTERIOVENOUS (AV) FISTULA CREATION ( RADIAL CEPHALIC);  Surgeon: Jama Cordella MATSU, MD;  Location: [facility] ORS;  Service: Vascular;  Laterality: Left;   CERVICAL BIOPSY  W/ LOOP ELECTRODE EXCISION  2018   COLONOSCOPY N/A 01/30/2022   Procedure: COLONOSCOPY;  Surgeon: Onita Elspeth Sharper, DO;  Location: [facility] ENDOSCOPY;  Service: Gastroenterology;  Laterality: N/A;   COLONOSCOPY WITH PROPOFOL  N/A 02/01/2022   Procedure: COLONOSCOPY WITH PROPOFOL ;  Surgeon: Maryruth Ole DASEN, MD;  Location: [facility] ENDOSCOPY;  Service: Endoscopy;  Laterality: N/A;   DIAGNOSTIC LAPAROSCOPY     due to removal of PD catheters   DIALYSIS/PERMA CATHETER INSERTION N/A 06/09/2022  Procedure: DIALYSIS/PERMA CATHETER INSERTION;  Surgeon: Marea Selinda RAMAN, MD;  Location: [facility] INVASIVE CV LAB;  Service: Cardiovascular;  Laterality: N/A;   DIALYSIS/PERMA CATHETER INSERTION N/A 06/30/2022   Procedure: DIALYSIS/PERMA CATHETER INSERTION;  Surgeon: Gretta Lonni PARAS, MD;  Location: [facility] INVASIVE CV LAB;  Service: Cardiovascular;  Laterality: N/A;   DIALYSIS/PERMA CATHETER INSERTION N/A 09/28/2023   Procedure: DIALYSIS/PERMA CATHETER INSERTION;  Surgeon: Norine Manuelita LABOR, MD;  Location: [facility] INVASIVE CV LAB;  Service: Cardiovascular;  Laterality: N/A;   DIALYSIS/PERMA CATHETER INSERTION N/A 09/30/2023   Procedure: DIALYSIS/PERMA CATHETER INSERTION;  Surgeon: Melia Lynwood ORN, MD;  Location: [facility] INVASIVE CV LAB;  Service: Cardiovascular;  Laterality: N/A;   DIALYSIS/PERMA CATHETER INSERTION N/A 10/23/2023   Procedure: DIALYSIS/PERMA CATHETER INSERTION;  Surgeon: Tobie Gordy POUR, MD;  Location: [facility] INVASIVE CV LAB;  Service: Cardiovascular;  Laterality: N/A;   DIALYSIS/PERMA CATHETER REMOVAL N/A 09/28/2023    Procedure: DIALYSIS/PERMA CATHETER REMOVAL;  Surgeon: Norine Manuelita LABOR, MD;  Location: [facility] CV [facility];  Service: Cardiovascular;  Laterality: N/A;   DIALYSIS/PERMA CATHETER REMOVAL N/A 10/23/2023   Procedure: DIALYSIS/PERMA CATHETER REMOVAL;  Surgeon: Tobie Gordy POUR, MD;  Location: [facility] INVASIVE CV LAB;  Service: Cardiovascular;  Laterality: N/A;   ESOPHAGOGASTRODUODENOSCOPY N/A 01/30/2022   Procedure: ESOPHAGOGASTRODUODENOSCOPY (EGD);  Surgeon: Onita Elspeth Sharper, DO;  Location: [facility] ENDOSCOPY;  Service: Gastroenterology;  Laterality: N/A;   ESOPHAGOGASTRODUODENOSCOPY (EGD) WITH PROPOFOL  N/A 02/01/2022   Procedure: ESOPHAGOGASTRODUODENOSCOPY (EGD) WITH PROPOFOL ;  Surgeon: Maryruth Ole DASEN, MD;  Location: [facility] ENDOSCOPY;  Service: Endoscopy;  Laterality: N/A;   IR FLUORO GUIDE CV LINE RIGHT  10/24/2023   IR REMOVAL TUN CV CATH W/O FL  10/24/2023   IR US  GUIDE VASC ACCESS RIGHT  10/24/2023   PERIPHERAL VASCULAR BALLOON ANGIOPLASTY Right 09/28/2023   Procedure: PERIPHERAL VASCULAR BALLOON ANGIOPLASTY;  Surgeon: Norine Manuelita LABOR, MD;  Location: [facility] CV [facility];  Service: Cardiovascular;  Laterality: Right;  innominate sheath   RENAL BIOPSY     x 2   TUMOR REMOVAL     right face   UTERINE ARTERY EMBOLIZATION  05/04/2022    Family History  Problem Relation Age of Onset   Multiple sclerosis Mother    Cancer Father    COPD Father     Social History   Socioeconomic History   Marital status: Divorced    Spouse name: Elsie   Number of children: Not on file   Years of education: Not on file   Highest education level: Some college, no degree  Occupational History   Occupation: [employer]  Tobacco Use   Smoking status: Former    Current packs/day: 0.00    Types: Cigarettes    Quit date: 2023    Years since quitting: 2.7   Smokeless tobacco: Never  Vaping Use   Vaping status: Never Used  Substance and Sexual Activity   Alcohol use: Never   Drug use: Never   Sexual  activity: Not Currently  Other Topics Concern   Not on file  Social History Narrative   Lives with Elsie, significant other. One pet, cat.    Social Drivers of Health   Financial Resource Strain: Medium Risk (06/07/2024)   Overall Financial Resource Strain (CARDIA)    Difficulty of Paying Living Expenses: Somewhat hard  Food Insecurity: Unknown (06/07/2024)   Hunger Vital Sign    Worried About Running Out of Food in the Last Year: Not on file    Ran Out of Food in the Last Year: Never  true  Transportation Needs: Unmet Transportation Needs (06/07/2024)   PRAPARE - Administrator, Civil Service (Medical): Yes    Lack of Transportation (Non-Medical): No  Physical Activity: Sufficiently Active (06/07/2024)   Exercise Vital Sign    Days of Exercise per Week: 4 days    Minutes of Exercise per Session: 40 min  Stress: Stress Concern Present (06/07/2024)   [employer] of Occupational Health - Occupational Stress Questionnaire    Feeling of Stress: Rather much  Social Connections: Moderately Isolated (06/07/2024)   Social Connection and Isolation Panel    Frequency of Communication with Friends and Family: Twice a week    Frequency of Social Gatherings with Friends and Family: Twice a week    Attends Religious Services: Never    Database administrator or Organizations: No    Attends Engineer, structural: Not on file    Marital Status: Living with partner  Intimate Partner Violence: Not At Risk (12/25/2023)   Humiliation, Afraid, Rape, and Kick questionnaire    Fear of Current or Ex-Partner: No    Emotionally Abused: No    Physically Abused: No    Sexually Abused: No    Review of Systems  Respiratory:  Positive for apnea and shortness of breath. Negative for cough.   Psychiatric/Behavioral:  Positive for sleep disturbance. The patient is nervous/anxious.     There were no vitals filed for this visit.   Physical Exam Constitutional:      Appearance:  She is obese.  HENT:     Head: Normocephalic.     Nose: Congestion present.     Mouth/Throat:     Mouth: Mucous membranes are moist.     Comments: Crowded oropharynx, Mallampati 3, macroglossia Eyes:     General: No scleral icterus. Cardiovascular:     Rate and Rhythm: Normal rate and regular rhythm.     Heart sounds: No murmur heard.    No friction rub.  Pulmonary:     Effort: No respiratory distress.     Breath sounds: No stridor. No wheezing or rhonchi.  Musculoskeletal:     Cervical back: No rigidity or tenderness.  Neurological:     Mental Status: She is alert.  Psychiatric:        Mood and Affect: Mood normal.    Epworth Sleepiness Scale of 2  Data Reviewed: Previous sleep study from March 2024 shows AHI of 6.8, O2 nadir of 87%  Records from recent as well as patient at [facility] was reviewed  Chest x-ray with mild pulmonary vascular congestion, comparing to x-rays from couple years ago with no significant changes  Assessment/Plan: Mild obstructive sleep apnea Did try CPAP previously - CPAP discontinued following weight loss - Was having issues with entanglement with the hoses, mask fit issues  Fatigue - Multifactorial - Anemia - Hypothyroidism - Recent cardiorespiratory arrest from anaphylaxis  Class I obesity - She is working on weight loss efforts  Smoker - Smoking cessation counseling - She is committed to quitting  Deconditioning - Related to recent injuries - Graded activities as tolerated  Discussed myofunctional therapy as a measure that may help mild snoring and mild sleep disordered breathing - This may help over time if performed consistently  End-stage renal disease - On dialysis  For nasal stuffiness and congestion - Encouraged to use nasal steroids  History of anxiety disorder, chronic depression  Follow-up in about 3 months  We can consider repeating sleep study if unable to lose  weight, if she has significant daytime  symptoms  Jennet Epley MD [facility] 06/10/2024, 8:23 AM  CC: Lendia Nordmann L, NP-C

## 2024-06-29 ENCOUNTER — Other Ambulatory Visit: Payer: Self-pay | Admitting: Family Medicine

## 2024-07-06 ENCOUNTER — Other Ambulatory Visit (HOSPITAL_COMMUNITY): Payer: Self-pay

## 2024-07-06 MED ORDER — HYDROCODONE-ACETAMINOPHEN 5-325 MG PO TABS
1.0000 | ORAL_TABLET | Freq: Every day | ORAL | 0 refills | Status: DC | PRN
  Filled 2024-08-05: qty 150, 30d supply, fill #0

## 2024-07-06 MED ORDER — HYDROCODONE-ACETAMINOPHEN 5-325 MG PO TABS
1.0000 | ORAL_TABLET | Freq: Every day | ORAL | 0 refills | Status: AC | PRN
  Filled 2024-07-06: qty 150, 30d supply, fill #0

## 2024-08-05 ENCOUNTER — Other Ambulatory Visit (HOSPITAL_COMMUNITY): Payer: Self-pay

## 2024-09-01 ENCOUNTER — Other Ambulatory Visit (HOSPITAL_COMMUNITY): Payer: Self-pay

## 2024-09-01 MED ORDER — HYDROCODONE-ACETAMINOPHEN 5-325 MG PO TABS
1.0000 | ORAL_TABLET | Freq: Every day | ORAL | 0 refills | Status: AC | PRN
  Filled 2024-09-02: qty 150, 30d supply, fill #0

## 2024-09-01 MED ORDER — HYDROCODONE-ACETAMINOPHEN 5-325 MG PO TABS: 1.0000 | ORAL_TABLET | Freq: Every day | ORAL | 0 refills | Status: AC | PRN

## 2024-09-02 ENCOUNTER — Other Ambulatory Visit (HOSPITAL_COMMUNITY): Payer: Self-pay

## 2024-09-05 ENCOUNTER — Other Ambulatory Visit (HOSPITAL_COMMUNITY): Payer: Self-pay

## 2024-09-12 ENCOUNTER — Ambulatory Visit: Admitting: Pulmonary Disease

## 2024-09-12 ENCOUNTER — Encounter: Payer: Self-pay | Admitting: Pulmonary Disease

## 2024-09-23 ENCOUNTER — Emergency Department (HOSPITAL_COMMUNITY)
Admission: EM | Admit: 2024-09-23 | Discharge: 2024-09-23 | Disposition: A | Discharge status: Home / Self Care | Attending: Emergency Medicine | Admitting: Emergency Medicine

## 2024-09-23 ENCOUNTER — Emergency Department (HOSPITAL_COMMUNITY)

## 2024-09-23 DIAGNOSIS — T8241XA Breakdown (mechanical) of vascular dialysis catheter, initial encounter: Secondary | ICD-10-CM

## 2024-09-23 LAB — COMPREHENSIVE METABOLIC PANEL WITH GFR
ALT: 5 U/L (ref 0–44)
AST: 17 U/L (ref 15–41)
Albumin: 3.6 g/dL (ref 3.5–5.0)
Alkaline Phosphatase: 96 U/L (ref 38–126)
Anion gap: 13 (ref 5–15)
BUN: 37 mg/dL — ABNORMAL HIGH (ref 6–20)
CO2: 25 mmol/L (ref 22–32)
Calcium: 9.3 mg/dL (ref 8.9–10.3)
Chloride: 89 mmol/L — ABNORMAL LOW (ref 98–111)
Creatinine, Ser: 8.2 mg/dL — ABNORMAL HIGH (ref 0.44–1.00)
GFR, Estimated: 6 mL/min — ABNORMAL LOW
Glucose, Bld: 98 mg/dL (ref 70–99)
Potassium: 4.3 mmol/L (ref 3.5–5.1)
Sodium: 127 mmol/L — ABNORMAL LOW (ref 135–145)
Total Bilirubin: 0.3 mg/dL (ref 0.0–1.2)
Total Protein: 6.7 g/dL (ref 6.5–8.1)

## 2024-09-23 LAB — CBC WITH DIFFERENTIAL/PLATELET
Abs Immature Granulocytes: 0.02 10*3/uL (ref 0.00–0.07)
Basophils Absolute: 0 10*3/uL (ref 0.0–0.1)
Basophils Relative: 1 %
Eosinophils Absolute: 0.2 10*3/uL (ref 0.0–0.5)
Eosinophils Relative: 4 %
HCT: 27.6 % — ABNORMAL LOW (ref 36.0–46.0)
Hemoglobin: 9 g/dL — ABNORMAL LOW (ref 12.0–15.0)
Immature Granulocytes: 0 %
Lymphocytes Relative: 12 %
Lymphs Abs: 0.7 10*3/uL (ref 0.7–4.0)
MCH: 26.2 pg (ref 26.0–34.0)
MCHC: 32.6 g/dL (ref 30.0–36.0)
MCV: 80.5 fL (ref 80.0–100.0)
Monocytes Absolute: 0.5 10*3/uL (ref 0.1–1.0)
Monocytes Relative: 9 %
Neutro Abs: 4.1 10*3/uL (ref 1.7–7.7)
Neutrophils Relative %: 74 %
Platelets: 266 10*3/uL (ref 150–400)
RBC: 3.43 MIL/uL — ABNORMAL LOW (ref 3.87–5.11)
RDW: 17.4 % — ABNORMAL HIGH (ref 11.5–15.5)
WBC: 5.6 10*3/uL (ref 4.0–10.5)
nRBC: 0 % (ref 0.0–0.2)

## 2024-09-23 MED ORDER — HEPARIN SODIUM (PORCINE) 1000 UNIT/ML IJ SOLN
INTRAMUSCULAR | Status: AC | PRN
  Administered 2024-09-23: 2000 [IU] via INTRAVENOUS

## 2024-09-23 MED ORDER — LIDOCAINE HCL 1 % IJ SOLN
INTRAMUSCULAR | Status: AC
  Filled 2024-09-23: qty 20

## 2024-09-23 MED ORDER — FENTANYL CITRATE (PF) 100 MCG/2ML IJ SOLN
INTRAMUSCULAR | Status: AC
  Filled 2024-09-23: qty 2

## 2024-09-23 MED ORDER — VANCOMYCIN HCL IN DEXTROSE 1-5 GM/200ML-% IV SOLN
1000.0000 mg | Freq: Once | INTRAVENOUS | Status: AC
  Administered 2024-09-23: 1000 mg via INTRAVENOUS
  Filled 2024-09-23: qty 200

## 2024-09-23 MED ORDER — LIDOCAINE-EPINEPHRINE 1 %-1:100000 IJ SOLN
20.0000 mL | Freq: Once | INTRAMUSCULAR | Status: AC
  Administered 2024-09-23: 10 mL via INTRADERMAL
  Filled 2024-09-23: qty 20

## 2024-09-23 MED ORDER — MIDAZOLAM HCL (PF) 2 MG/2ML IJ SOLN
INTRAMUSCULAR | Status: AC | PRN
  Administered 2024-09-23 (×2): 1 mg via INTRAVENOUS

## 2024-09-23 MED ORDER — MIDAZOLAM HCL 2 MG/2ML IJ SOLN
INTRAMUSCULAR | Status: AC
  Filled 2024-09-23: qty 2

## 2024-09-23 MED ORDER — LIDOCAINE-EPINEPHRINE 1 %-1:100000 IJ SOLN
INTRAMUSCULAR | Status: AC
  Filled 2024-09-23: qty 20

## 2024-09-23 MED ORDER — HEPARIN SODIUM (PORCINE) 1000 UNIT/ML IJ SOLN
INTRAMUSCULAR | Status: AC
  Filled 2024-09-23: qty 10

## 2024-09-23 NOTE — ED Provider Notes (Signed)
 " [place] [facility]ERGENCY DE[facility]MENT AT [facility] Provider Note   CSN: [id number] Arrival date & time: 09/23/24  1021     Patient presents with: Vascular Access Problem   [facility] Holly [name] is a 39 y.o. female patient who presented for hemodialysis catheter replacement.  Patient reports she was doing home hemodialysis last night and finished her treatment when her catheter became displaced.  Patient called her surgical team this morning who stated that they could not see her and was instructed to be seen at the emergency department for replacement.  Patient states that she is currently undergoing treatment for an infection in her port site and receives IV vancomycin  3 times a week and is due today for her 6 treatment.  Patient states she was able to finish her dialysis treatment yesterday and has not missed any scheduled dialysis treatments.  She is currently asymptomatic and is in no acute distress at this time.    HPI     Prior to Admission medications  Medication Sig Start Date End Date Taking? Authorizing Provider  acetaminophen  (TYLENOL ) 500 MG tablet Take 500 mg by mouth every 6 (six) hours as needed. Patient not taking: Reported on 06/10/2024    [provider]  acyclovir  (ZOVIRAX ) 200 MG capsule Take 200 mg by mouth 2 (two) times daily as needed (out breaks). 08/14/21   [provider]  albuterol  (VENTOLIN  HFA) 108 (90 Base) MCG/ACT inhaler INHALE 2 TO 4 PUFFS INTO THE LUNGS EVERY 4 HOURS AS NEEDED FOR WHEEZING OR COUGH 01/08/24   [name], [name], NP-C  amLODipine  (NORVASC ) 10 MG tablet Take 10 mg by mouth daily. Per [facility] discharge 12/24/23    [provider]  ascorbic acid (VITAMIN C) 250 MG CHEW Chew 250 mg by mouth daily. Patient not taking: Reported on 06/10/2024    [provider]  atomoxetine  (STRATTERA ) 10 MG capsule Take 1 capsule (10 mg total) by mouth 2 (two) times daily with a meal. 09/15/23   Arfeen, Leni DASEN, MD   carvedilol  (COREG ) 12.5 MG tablet Take 12.5 mg by mouth 2 (two) times daily with a meal.    [provider]  cinacalcet  (SENSIPAR ) 60 MG tablet Take 60 mg by mouth at bedtime.    [provider]  diclofenac  Sodium (VOLTAREN ) 1 % GEL Apply 1 application  topically 4 (four) times daily as needed (hand,feet,thighs and back pain).    [provider]  EPINEPHrine  0.3 mg/0.3 mL IJ SOAJ injection Inject 0.3 mg into the muscle as needed for anaphylaxis.    [provider]  epoetin  alfa (EPOGEN ) 20000 UNIT/ML injection Inject 20,000 Units into the skin every 14 (fourteen) days.    [provider]  ferric citrate  (AURYXIA ) 1 GM 210 MG(Fe) tablet Take 420 mg by mouth in the morning, at noon, and at bedtime. Patient taking differently: Take 420 mg by mouth in the morning, at noon, and at bedtime. 2 times daily with meal    [provider]  gabapentin  (NEURONTIN ) 100 MG capsule Take 100 mg by mouth at bedtime. Per [facility] discharged 12/24/23 Patient not taking: Reported on 06/10/2024    [provider]  HYDROcodone -acetaminophen  (NORCO) 7.5-325 MG tablet Take 1 tablet by mouth 5 (five) times daily as needed for pain stop percocet 7.5/325. 01/14/24     HYDROcodone -acetaminophen  (NORCO/VICODIN) 5-325 MG tablet Take 1 tablet by mouth 4 (four) times daily as needed for pain. 12/28/23     HYDROcodone -acetaminophen  (NORCO/VICODIN) 5-325 MG tablet  Take 1 tablet by mouth 5 (five) times daily as needed for pain 03/04/24     HYDROcodone -acetaminophen  (NORCO/VICODIN) 5-325 MG tablet Take 1 tablet by mouth 5 (five) times daily as needed for pain. Patient not taking: Reported on 06/10/2024 02/04/24     HYDROcodone -acetaminophen  (NORCO/VICODIN) 5-325 MG tablet Take 1 tablet by mouth 5 (five) times daily as needed for pain. Patient not taking: Reported on 06/10/2024 05/03/24     HYDROcodone -acetaminophen  (NORCO/VICODIN) 5-325 MG tablet Take 1 tablet by mouth 5 (five) times  daily as needed for pain. Patient not taking: Reported on 06/10/2024 04/04/24     HYDROcodone -acetaminophen  (NORCO/VICODIN) 5-325 MG tablet Take 1 tablet by mouth 5 (five) times daily as needed for pain. Patient not taking: Reported on 06/10/2024 05/30/24     HYDROcodone -acetaminophen  (NORCO/VICODIN) 5-325 MG tablet Take 1 tablet by mouth 5 (five) times daily as needed for pain 07/06/24     HYDROcodone -acetaminophen  (NORCO/VICODIN) 5-325 MG tablet Take 1 tablet by mouth 5 (five) times daily as needed for pain 09/01/24     HYDROcodone -acetaminophen  (NORCO/VICODIN) 5-325 MG tablet Take 1 tablet by mouth 5 (five) times daily as needed for pain 10/01/24 10/01/24     ipratropium-albuterol  (DUONEB) 0.5-2.5 (3) MG/3ML SOLN USE 1 AMPULE IN NEBULIZER EVERY 6 HOURS AS NEEDED 07/04/24   [name], [name], NP-C  iron  sucrose (VENOFER ) 20 MG/ML injection Inject 1,000 mg into the vein every 30 (thirty) days. Patient taking differently: Inject 1,000 mg into the vein every 30 (thirty) days. 100 every month    [provider]  levothyroxine  (SYNTHROID ) 200 MCG tablet Take 200 mcg by mouth at bedtime.    [provider]  loratadine  (CLARITIN ) 10 MG tablet Take 20 mg by mouth. 01/04/24 01/03/25  [provider]  oxyCODONE -acetaminophen  (PERCOCET) 7.5-325 MG tablet Take 1 tablet by mouth every 4 (four) hours as needed for pain, stop hydrocodone  5/325. Patient not taking: Reported on 06/10/2024 01/06/24     tiZANidine  (ZANAFLEX ) 4 MG tablet Take 1/2 tablet (2 mg total) by mouth 3 (three) times daily as needed. Patient not taking: Reported on 06/10/2024 01/06/24     Vitamin D, Ergocalciferol, (DRISDOL) 1.25 MG (50000 UNIT) CAPS capsule Take 50,000 Units by mouth once a week. wednesday Patient not taking: Reported on 06/10/2024    [provider]    Allergies: Ancef  [cefazolin ], Ciprofloxacin, Levetiracetam, Oxycodone -acetaminophen , Rituximab, Tramadol, and Chlorhexidine     Review of Systems   All other systems reviewed and are negative.   Updated Vital Signs BP 130/84   Pulse 82   Temp 98.2 F (36.8 C) (Oral)   Resp 19   Ht 5' 7 (1.702 m)   Wt 99.8 kg   LMP 03/31/2022 Comment: pt bleeding last 6 weeks with recent multiple transfusions  SpO2 100%   BMI 34.46 kg/m   Physical Exam Vitals and nursing note reviewed.  Constitutional:      General: She is not in acute distress.    Appearance: Normal appearance.  HENT:     Head: Normocephalic and atraumatic.  Eyes:     Extraocular Movements: Extraocular movements intact.     Conjunctiva/sclera: Conjunctivae normal.     Pupils: Pupils are equal, round, and reactive to light.  Cardiovascular:     Rate and Rhythm: Normal rate and regular rhythm.     Pulses: Normal pulses.  Pulmonary:     Effort: Pulmonary effort is normal. No respiratory distress.  Musculoskeletal:        General: Normal range of  motion.     Cervical back: Normal range of motion.  Skin:    General: Skin is warm and dry.     Capillary Refill: Capillary refill takes less than 2 seconds.  Neurological:     General: No focal deficit present.     Mental Status: She is alert. Mental status is at baseline.  Psychiatric:        Mood and Affect: Mood normal.     (all labs ordered are listed, but only abnormal results are displayed) Labs Reviewed  CBC WITH DIFFERENTIAL/PLATELET - Abnormal; Notable for the following components:      Result Value   RBC 3.43 (*)    Hemoglobin 9.0 (*)    HCT 27.6 (*)    RDW 17.4 (*)    All other components within normal limits  COMPREHENSIVE METABOLIC PANEL WITH GFR - Abnormal; Notable for the following components:   Sodium 127 (*)    Chloride 89 (*)    BUN 37 (*)    Creatinine, Ser 8.20 (*)    GFR, Estimated 6 (*)    All other components within normal limits    EKG: None  Radiology: IR TUNNELED CENTRAL VENOUS CATH [facility] W IMG Result Date: 09/23/2024 INDICATION: Patient home cath came out last night and needs  replacement - sees vascular in [facility] however surgeons stated that she had to come to the emergency department due to no one in the office today. ESRD requiring HD.  Catheter malfunction. EXAM: TUNNELED CENTRAL VENOUS HEMODIALYSIS CATHETER PLACEMENT WITH ULTRASOUND AND FLUOROSCOPIC GUIDANCE MEDICATIONS: Vancomycin  1 gm IV. The antibiotic was given in an appropriate time interval prior to skin puncture. ANESTHESIA/SEDATION: Local anesthetic and single agent sedation was employed during this procedure. A total of Versed  2 mg was administered intravenously. The patient's level of consciousness and vital signs were monitored continuously by radiology nursing throughout the procedure under my direct supervision. FLUOROSCOPY: Radiation Exposure Index and estimated peak skin dose (PSD); Reference air kerma (RAK), 1 mGy. COMPLICATIONS: None immediate. PROCEDURE: Informed written consent was obtained from the patient after a discussion of the risks, benefits, and alternatives to treatment. Questions regarding the procedure were encouraged and answered. The LEFT neck and chest were prepped with chlorhexidine  in a sterile fashion, and a sterile drape was applied covering the operative field. Maximum barrier sterile technique with sterile gowns and gloves were used for the procedure. A timeout was performed prior to the initiation of the procedure. After creating a small venotomy incision, a micropuncture kit was utilized to access the internal jugular vein. Real-time ultrasound guidance was utilized for vascular access including the acquisition of a permanent ultrasound image documenting patency of the accessed vessel. The microwire was utilized to measure appropriate catheter length. A stiff Glidewire was advanced to the level of the IVC and the micropuncture sheath was exchanged for a peel-away sheath. A palindrome tunneled hemodialysis catheter measuring 28 cm from tip to cuff was tunneled in a retrograde fashion from the  anterior chest wall to the venotomy incision. The catheter was then placed through the peel-away sheath with tips ultimately positioned within the superior aspect of the right atrium. Final catheter positioning was confirmed and documented with a spot radiographic image. The catheter aspirates and flushes normally. The catheter was flushed with appropriate volume heparin  dwells. The catheter exit site was secured with a 0-Prolene retention suture. The venotomy incision was closed with Dermabond. Dressings were applied. The patient tolerated the procedure well without immediate post procedural complication. IMPRESSION:  Successful placement of 28 cm tip to cuff tunneled hemodialysis catheter via the LEFT internal jugular vein. The tip of the catheter is positioned within the proximal RIGHT atrium. The catheter is ready for immediate use. Thom Hall, MD [facility] Radiology Electronically Signed   By: Thom Hall M.D.   On: 09/23/2024 17:23     Procedures   Medications Ordered in the ED  vancomycin  (VANCOCIN ) IVPB 1000 mg/200 mL premix (0 mg Intravenous Stopped 09/23/24 1823)  lidocaine -EPINEPHrine  (XYLOCAINE  W/EPI) 1 %-1:100000 (with pres) injection 20 mL (10 mLs Intradermal Given 09/23/24 1710)  midazolam  PF (VERSED ) injection (1 mg Intravenous Given 09/23/24 1655)  heparin  sodium (porcine) injection (2,000 Units Intravenous Given 09/23/24 1701)                                 Medical Decision Making Amount and/or Complexity of Data Reviewed Labs: ordered. Radiology: ordered.   Patient presents to the ED for: Hemodialysis catheter replacement Co-morbid conditions: End-stage renal disease on hemodialysis  Additional history/records obtained and reviewed: Additional history obtained from patient's nephrologist Dennise MD who contacted to make aware of patient needed replacement.   Clinical Course as of 09/29/24 1209  Fri Sep 23, 2024  1309  [facility] in [place] -  Vanc x 2 weeks after previous port infection, recent blood cultures neg. Went to complete treatment yesterday and port came out.  [ML]  1310 Internal jugular cath  [ML]  1311 Dennise Capri, MD  [ML]    Clinical Course User Index [ML] Willma Duwaine CROME, [place]    Data Reviewed / Actions Taken: Labs ordered -patient showed moderate anemia, increased creatinine -all per patient baseline.  Management / Treatments: IR team was contacted who took patient from the emergency department for catheter replacement.   ED Course / Reassessments: Problem List:  39 year old female presented for hemodialysis catheter replacement. Initial assessment included history, physical exam, and review of prior medical records.  Basic laboratory studies were completed on the patient in case of surgery and found to be per her baseline. Management included contacting IR team and coordinating replacement.  Patient was taken by IR team with successful catheter replacement.  IR team also administered patient's scheduled vancomycin  dosing prior to procedure.  Upon reassessment once patient was back into the emergency department, procedure was well-tolerated and patient was cleared for discharge.   Consultations:  IR- IR team Allred PA-C, Mugweru MD Consult recommendations incorporated into plan: to take patient for replacement and then can discharge from ED after procedure   Disposition: Disposition: Discharge with close follow-up with PCP and nephrology for further evaluation and care Rationale for disposition: Stable for discharge The disposition plan and rationale were discussed with the patient at the bedside, all questions were addressed, and the patient demonstrated understanding.  This note was produced using Electronics Engineer. While I have reviewed and verified all clinical information, transcription errors may remain.      Final diagnoses:  Hemodialysis catheter  dysfunction, initial encounter    ED Discharge Orders     None          Willma Duwaine CROME, [place] 09/29/24 1218  "

## 2024-09-23 NOTE — Discharge Instructions (Signed)
 Thank you for visiting the Emergency Department today. It was a pleasure to be part of your healthcare team.   You were seen today for hemodialysis catheter placement  Please follow up with your vascular surgery providers   Thank you for trusting us  with your health.

## 2024-09-23 NOTE — ED Triage Notes (Signed)
 Pt reports was doing home hemo last night and finished treatment when and her catether came out. Her surgeon cannot get her in, and she was told to come to ER

## 2024-09-23 NOTE — Procedures (Signed)
 Vascular and Interventional Radiology Procedure Note  Patient: Holly [name] DOB: August 16, 1986 Medical Record Number: [id number] Note Date/Time: 09/23/24 4:25 PM   Performing Physician: Thom Hall, MD Assistant(s): None  Diagnosis: ESRD requiring Hemodialysis and Catheter malfunction  Procedure: TUNNELED HEMODIALYSIS CATHETER PLACEMENT  Anesthesia: Conscious Sedation Complications: None Estimated Blood Loss: Minimal Specimens:  None  Findings:  Successful placement of left-sided, 28 cm (tip-to-cuff), tunneled hemodialysis catheter with the tip of the catheter in the proximal right atrium.  Plan: Catheter ready for use.  See detailed procedure note with images in PACS. The patient tolerated the procedure well without incident or complication and was returned to Recovery in stable condition.    Thom Hall, MD [facility] Radiology   Pager. [phone] Clinic. [phone]

## 2024-09-23 NOTE — ED Notes (Signed)
 Patient in IR

## 2024-09-23 NOTE — Progress Notes (Signed)
 Patient ID: Holly [name], female   DOB: [birth year], 39 y.o.   MRN: [id number]    Referring Physician(s): Lloyd,M  Supervising Physician: Hughes Simmonds  Patient Status:  [facility] ED  Chief Complaint: Displaced hemodialysis catheter; referred for HD cath replacement   Subjective: Pt known to IR team from right IJ tunneled HD catheter placement and removal of previously existing HD catheter on 10/24/2023 secondary to poor incorporation of the catheter cuff and repeated retraction of the catheter.  Since that time patient has had additional catheter replacements done by vascular surgeon in [place].  She also reports prior infection at access site in the right chest wall.  She presented to [facility] ED today after HD cath once again fell out.  She is now scheduled for HD catheter placement.  She currently denies fever, headache, dyspnea, cough, abdominal/back pain, nausea, vomiting or bleeding.  She does have some tenderness at previous right HD cath insertion site.  Additional medical history as listed below.   Past Medical History:  Diagnosis Date   Anemia    Anxiety    Arthritis    hands   Asthmatic bronchitis 02/11/2023   Blood transfusion without reported diagnosis    Cancer (HCC)    Complication of anesthesia    with Fentanyl  she is difficult to awake   Depression    Dyspnea    due to anemia   Epilepsy (HCC)    ESRD on hemodialysis (HCC)    home HD 3 hrs, 5 days per wk   Hashimoto's disease    History of kidney stones    HLD (hyperlipidemia)    Hypertension    Hypothyroidism    Membranoproliferative glomerulonephritis type 2, idiopathic    Pneumonia    Thyroid  disease    Past Surgical History:  Procedure Laterality Date   A/V FISTULAGRAM Left 09/09/2022   Procedure: A/V Fistulagram;  Surgeon: Jama Cordella MATSU, MD;  Location: [facility] INVASIVE CV LAB;  Service: Cardiovascular;  Laterality: Left;   ABDOMINAL HYSTERECTOMY  06/24/2022   ABDOMINAL SURGERY     AV  FISTULA PLACEMENT Left 01/17/2022   Procedure: ARTERIOVENOUS (AV) FISTULA CREATION ( RADIAL CEPHALIC);  Surgeon: Jama Cordella MATSU, MD;  Location: [facility] ORS;  Service: Vascular;  Laterality: Left;   CERVICAL BIOPSY  W/ LOOP ELECTRODE EXCISION  2018   COLONOSCOPY N/A 01/30/2022   Procedure: COLONOSCOPY;  Surgeon: Onita Elspeth Sharper, DO;  Location: [facility] ENDOSCOPY;  Service: Gastroenterology;  Laterality: N/A;   COLONOSCOPY WITH PROPOFOL  N/A 02/01/2022   Procedure: COLONOSCOPY WITH PROPOFOL ;  Surgeon: Maryruth Ole DASEN, MD;  Location: [facility] ENDOSCOPY;  Service: Endoscopy;  Laterality: N/A;   DIAGNOSTIC LAPAROSCOPY     due to removal of PD catheters   DIALYSIS/PERMA CATHETER INSERTION N/A 06/09/2022   Procedure: DIALYSIS/PERMA CATHETER INSERTION;  Surgeon: Marea Selinda RAMAN, MD;  Location: [facility] INVAS[facility] CV LAB;  Service: Cardiovascular;  Laterality: N/A;   DIALYSIS/PERMA CATHETER INSERTION N/A 06/30/2022   Procedure: DIALYSIS/PERMA CATHETER INSERTION;  Surgeon: Gretta Lonni PARAS, MD;  Location: [facility] INV[facility] CV [facility];  Service: Cardiovascular;  Laterality: N/A;   DIALYSIS/PERMA CATHETER INSERTION N/A 09/28/2023   Procedure: DIALYSIS/PERMA CATHETER INSERTION;  Surgeon: Norine Manuelita LABOR, MD;  Location: [facility] INV[facility] CV [facility];  Service: Cardiovascular;  Laterality: N/A;   DIALYSIS/PERMA CATHETER INSERTION N/A 09/30/2023   Procedure: DIALYSIS/PERMA CATHETER INSERTION;  Surgeon: Melia Lynwood ORN, MD;  Location: [facility] INVASIVE CV LAB;  Service: Cardiovascular;  Laterality: N/A;   DIALYSIS/PERMA CATHETER INSERTION N/A 10/23/2023  Procedure: DIALYSIS/PERMA CATHETER INSERTION;  Surgeon: Tobie Gordy POUR, MD;  Location: [facility] INVASIVE CV LAB;  Service: Cardiovascular;  Laterality: N/A;   DIALYSIS/PERMA CATHETER REMOVAL N/A 09/28/2023   Procedure: DIALYSIS/PERMA CATHETER REMOVAL;  Surgeon: Norine Manuelita LABOR, MD;  Location: [facility];  Service: Cardiovascular;  Laterality: N/A;   DIALYSIS/PERMA CATHETER REMOVAL N/A  10/23/2023   Procedure: DIALYSIS/PERMA CATHETER REMOVAL;  Surgeon: Tobie Gordy POUR, MD;  Location: [facility] INVASIVE CV LAB;  Service: Cardiovascular;  Laterality: N/A;   ESOPHAGOGASTRODUODENOSCOPY N/A 01/30/2022   Procedure: ESOPHAGOGASTRODUODENOSCOPY (EGD);  Surgeon: Onita Elspeth Sharper, DO;  Location: [facility] ENDOSCOPY;  Service: Gastroenterology;  Laterality: N/A;   ESOPHAGOGASTRODUODENOSCOPY (EGD) WITH PROPOFOL  N/A 02/01/2022   Procedure: ESOPHAGOGASTRODUODENOSCOPY (EGD) WITH PROPOFOL ;  Surgeon: Maryruth Ole DASEN, MD;  Location: [facility] ENDOSCOPY;  Service: Endoscopy;  Laterality: N/A;   IR FLUORO GUIDE CV LINE RIGHT  10/24/2023   IR REMOVAL TUN CV CATH W/O FL  10/24/2023   IR US  GUIDE VASC ACCESS RIGHT  10/24/2023   PERIPHERAL VASCULAR BALLOON ANGIOPLASTY Right 09/28/2023   Procedure: PERIPHERAL VASCULAR BALLOON ANGIOPLASTY;  Surgeon: Norine Manuelita LABOR, MD;  Location: [facility];  Service: Cardiovascular;  Laterality: Right;  innominate sheath   RENAL BIOPSY     x 2   TUMOR REMOVAL     right face   UTERINE ARTERY EMBOLIZATION  05/04/2022      Allergies: Ancef  [cefazolin ], Ciprofloxacin, Levetiracetam, Oxycodone -acetaminophen , Rituximab, Tramadol, and Chlorhexidine   Medications: Prior to Admission medications  Medication Sig Start Date End Date Taking? Authorizing Provider  acetaminophen  (TYLENOL ) 500 MG tablet Take 500 mg by mouth every 6 (six) hours as needed. Patient not taking: Reported on 06/10/2024    [provider]  acyclovir  (ZOVIRAX ) 200 MG capsule Take 200 mg by mouth 2 (two) times daily as needed (out breaks). 08/14/21   [provider]  albuterol  (VENTOLIN  HFA) 108 (90 Base) MCG/ACT inhaler INHALE 2 TO 4 PUFFS INTO THE LUNGS EVERY 4 HOURS AS NEEDED FOR WHEEZING OR COUGH 01/08/24   [name], [name], NP-C  amLODipine  (NORVASC ) 10 MG tablet Take 10 mg by mouth daily. Per [facility] discharge 12/24/23    [provider]  ascorbic acid (VITAMIN C) 250 MG  CHEW Chew 250 mg by mouth daily. Patient not taking: Reported on 06/10/2024    [provider]  atomoxetine  (STRATTERA ) 10 MG capsule Take 1 capsule (10 mg total) by mouth 2 (two) times daily with a meal. 09/15/23   Arfeen, Leni DASEN, MD  carvedilol  (COREG ) 12.5 MG tablet Take 12.5 mg by mouth 2 (two) times daily with a meal.    [provider]  cinacalcet  (SENSIPAR ) 60 MG tablet Take 60 mg by mouth at bedtime.    [provider]  diclofenac  Sodium (VOLTAREN ) 1 % GEL Apply 1 application  topically 4 (four) times daily as needed (hand,feet,thighs and back pain).    [provider]  EPINEPHrine  0.3 mg/0.3 mL IJ SOAJ injection Inject 0.3 mg into the muscle as needed for anaphylaxis.    [provider]  epoetin  alfa (EPOGEN ) 20000 UNIT/ML injection Inject 20,000 Units into the skin every 14 (fourteen) days.    [provider]  ferric citrate  (AURYXIA ) 1 GM 210 MG(Fe) tablet Take 420 mg by mouth in the morning, at noon, and at bedtime. Patient taking differently: Take 420 mg by mouth in the morning, at noon, and at bedtime. 2 times daily with meal    [provider]  gabapentin  (  NEURONTIN ) 100 MG capsule Take 100 mg by mouth at bedtime. Per [facility] discharged 12/24/23 Patient not taking: Reported on 06/10/2024    [provider]  HYDROcodone -acetaminophen  (NORCO) 7.5-325 MG tablet Take 1 tablet by mouth 5 (five) times daily as needed for pain stop percocet 7.5/325. 01/14/24     HYDROcodone -acetaminophen  (NORCO/VICODIN) 5-325 MG tablet Take 1 tablet by mouth 4 (four) times daily as needed for pain. 12/28/23     HYDROcodone -acetaminophen  (NORCO/VICODIN) 5-325 MG tablet Take 1 tablet by mouth 5 (five) times daily as needed for pain 03/04/24     HYDROcodone -acetaminophen  (NORCO/VICODIN) 5-325 MG tablet Take 1 tablet by mouth 5 (five) times daily as needed for pain. Patient not taking: Reported on 06/10/2024 02/04/24     HYDROcodone -acetaminophen   (NORCO/VICODIN) 5-325 MG tablet Take 1 tablet by mouth 5 (five) times daily as needed for pain. Patient not taking: Reported on 06/10/2024 05/03/24     HYDROcodone -acetaminophen  (NORCO/VICODIN) 5-325 MG tablet Take 1 tablet by mouth 5 (five) times daily as needed for pain. Patient not taking: Reported on 06/10/2024 04/04/24     HYDROcodone -acetaminophen  (NORCO/VICODIN) 5-325 MG tablet Take 1 tablet by mouth 5 (five) times daily as needed for pain. Patient not taking: Reported on 06/10/2024 05/30/24     HYDROcodone -acetaminophen  (NORCO/VICODIN) 5-325 MG tablet Take 1 tablet by mouth 5 (five) times daily as needed for pain 07/06/24     HYDROcodone -acetaminophen  (NORCO/VICODIN) 5-325 MG tablet Take 1 tablet by mouth 5 (five) times daily as needed for pain 09/01/24     HYDROcodone -acetaminophen  (NORCO/VICODIN) 5-325 MG tablet Take 1 tablet by mouth 5 (five) times daily as needed for pain 10/01/24 10/01/24     ipratropium-albuterol  (DUONEB) 0.5-2.5 (3) MG/3ML SOLN USE 1 AMPULE IN NEBULIZER EVERY 6 HOURS AS NEEDED 07/04/24   [name], [name], NP-C  iron  sucrose (VENOFER ) 20 MG/ML injection Inject 1,000 mg into the vein every 30 (thirty) days. Patient taking differently: Inject 1,000 mg into the vein every 30 (thirty) days. 100 every month    [provider]  levothyroxine  (SYNTHROID ) 200 MCG tablet Take 200 mcg by mouth at bedtime.    [provider]  loratadine  (CLARITIN ) 10 MG tablet Take 20 mg by mouth. 01/04/24 01/03/25  [provider]  oxyCODONE -acetaminophen  (PERCOCET) 7.5-325 MG tablet Take 1 tablet by mouth every 4 (four) hours as needed for pain, stop hydrocodone  5/325. Patient not taking: Reported on 06/10/2024 01/06/24     tiZANidine  (ZANAFLEX ) 4 MG tablet Take 1/2 tablet (2 mg total) by mouth 3 (three) times daily as needed. Patient not taking: Reported on 06/10/2024 01/06/24     Vitamin D, Ergocalciferol, (DRISDOL) 1.25 MG (50000 UNIT) CAPS capsule Take 50,000 Units by mouth  once a week. wednesday Patient not taking: Reported on 06/10/2024    [provider]     Vital Signs: BP (!) 130/93 (BP Location: Right Arm)   Pulse 96   Temp 98.2 F (36.8 C) (Oral)   Resp 19   Ht 5' 7 (1.702 m)   Wt 220 lb (99.8 kg)   LMP 03/31/2022 Comment: pt bleeding last 6 weeks with recent multiple transfusions  SpO2 100%   BMI 34.46 kg/m   Physical Exam patient awake, alert.  Chest clear to auscultation bilaterally.  Heart with regular rate and rhythm.  Abdomen soft, positive bowel sounds, nontender.  Trace pretibial edema bilaterally.  Previous HD cath insertion site right upper chest wall covered with clean, intact gauze dressing.  Site tender to palpation.  Imaging:  No results found.  Labs:  CBC: Recent Labs    01/02/24 1846 01/08/24 1435 06/07/24 1115 09/23/24 1525  WBC 7.9 7.1 6.9 5.6  HGB 7.6* 8.6 Repeated and verified X2.* 9.7* 9.0*  HCT 23.6* 25.9 Repeated and verified X2.* 29.6* 27.6*  PLT 201 282.0 226.0 266    COAGS: No results for input(s): INR, APTT in the last 8760 hours.  BMP: Recent Labs    12/07/23 2111 12/08/23 0454 01/02/24 1846 01/08/24 1435 06/07/24 1115 09/23/24 1525  NA 135 135 127* 132* 134* 127*  K 5.0 4.8 5.4* 5.5* 4.3 4.3  CL 96* 100 91* 91* 97 89*  CO2 23 20* 21* 27 24 25   GLUCOSE 175* 91 122* 94 116* 98  BUN 60* 61* 68* 46* 40* 37*  CALCIUM  9.5 9.0 8.7* 8.3* 8.7 9.3  CREATININE 11.53* 12.62* 9.07* 10.71* 11.23* 8.20*  GFRNONAA 4* 4* 5*  --   --  6*    LIVER FUNCTION TESTS: Recent Labs    10/23/23 2140 12/08/23 0454 09/23/24 1525  BILITOT 0.5 0.5 0.3  AST 10* 9* 17  ALT 13 9 5   ALKPHOS 90 92 96  PROT 6.8 6.1* 6.7  ALBUMIN 3.7 3.4* 3.6    Assessment and Plan: Patient with past medical history of end-stage renal disease on HD, membranoproliferative glomerulonephritis, hypertension, hypothyroidism, hyperlipidemia, nephrolithiasis, Hashimoto's disease, depression who presents now with displaced  right IJ HD catheter.  Request received for HD catheter replacement. Pt known to IR team from right IJ tunneled HD catheter placement and removal of previously existing HD catheter on 10/24/2023 secondary to poor incorporation of the catheter cuff and repeated retraction of the catheter.  Since that time patient has had additional catheter replacements done by vascular surgeon in [place].  She also reports prior infection at access site in the right chest wall. Details/risks of above procedure, including but not limited to, internal bleeding, infection, injury to adjacent structures discussed with patient/boyfriend with her understanding and consent.  Procedure scheduled for today.   Electronically Signed: D. Franky Rakers, PA-C 09/23/2024, 4:23 PM   I spent a total of 25 Minutes at the the patient's bedside AND on the patient's hospital floor or unit, greater than 50% of which was counseling/coordinating care for tunneled hemodialysis catheter replacement

## 2024-09-27 ENCOUNTER — Other Ambulatory Visit: Payer: Self-pay | Admitting: Family Medicine

## 2024-09-28 ENCOUNTER — Encounter: Payer: Self-pay | Admitting: Emergency Medicine

## 2024-09-28 ENCOUNTER — Ambulatory Visit
Admission: EM | Admit: 2024-09-28 | Discharge: 2024-09-28 | Disposition: A | Discharge status: Home / Self Care | Source: Home / Self Care

## 2024-09-28 ENCOUNTER — Ambulatory Visit (INDEPENDENT_AMBULATORY_CARE_PROVIDER_SITE_OTHER)

## 2024-09-28 DIAGNOSIS — J189 Pneumonia, unspecified organism: Secondary | ICD-10-CM

## 2024-09-28 DIAGNOSIS — R0602 Shortness of breath: Secondary | ICD-10-CM

## 2024-09-28 MED ORDER — PREDNISONE 20 MG PO TABS: 40.0000 mg | ORAL_TABLET | Freq: Every day | ORAL | 0 refills | Status: DC

## 2024-09-28 MED ORDER — AZITHROMYCIN 250 MG PO TABS: ORAL_TABLET | ORAL | 0 refills | Status: AC

## 2024-09-28 MED ORDER — AZITHROMYCIN 250 MG PO TABS: ORAL_TABLET | ORAL | 0 refills | Status: DC

## 2024-09-28 MED ORDER — PREDNISONE 20 MG PO TABS: 40.0000 mg | ORAL_TABLET | Freq: Every day | ORAL | 0 refills | Status: AC

## 2024-09-28 NOTE — Discharge Instructions (Signed)
" °  1. Pneumonia of left lower lobe due to infectious organism (Primary) - DG Chest 2 View x-ray performed in UC shows moderate left pleural effusion with patchy left basilar opacity consistent with possible pneumonia.  Empiric treatment with azithromycin  and prednisone  initiated. - ED EKG completed in UC shows normal sinus rhythm with a ventricular rate of 94 bpm, normal EKG, no STEMI. - azithromycin  (ZITHROMAX  Z-PAK) 250 MG tablet; Take 500 mg on day 1 followed by 250 mg for 4 days.  Take medication for a total of 5 days.  Dispense: 6 tablet; Refill: 0 - predniSONE  (DELTASONE ) 20 MG tablet; Take 2 tablets (40 mg total) by mouth daily for 5 days.  Dispense: 10 tablet; Refill: 0  -Continue to monitor symptoms for any change in severity if there is any escalation of current symptoms or development of new symptoms follow-up in ER for further evaluation and management. "

## 2024-09-28 NOTE — ED Triage Notes (Signed)
 Patient presents for chest pain, and shortness of breathe x 4 days.  Patient shortness of breathe got really bad last night.  Patient used her inhaler last night and neb treatment today.

## 2024-09-28 NOTE — ED Provider Notes (Signed)
 " [facility]  Note:  This document was prepared using Dragon voice recognition software and may include unintentional dictation errors.  MRN: [id number] DOB: [birth year]  Subjective:   Holly [name] is a 39 y.o. female presenting for chest pain, shortness of breath x 4 days.  Patient reports that shortness of breath became much worse last night overnight.  Has been using inhaler and took a neb treatment earlier today with minimal improvement.  Denies any known sick contacts.  Patient has end-stage renal disease on renal dialysis, obstructive sleep apnea, and epilepsy.  Current Medications[1]   Allergies[2]  Past Medical History:  Diagnosis Date   Anemia    Anxiety    Arthritis    hands   Asthmatic bronchitis 02/11/2023   Blood transfusion without reported diagnosis    Cancer (HCC)    Complication of anesthesia    with Fentanyl  she is difficult to awake   Depression    Dyspnea    due to anemia   Epilepsy (HCC)    ESRD on hemodialysis (HCC)    home HD 3 hrs, 5 days per wk   Hashimoto's disease    History of kidney stones    HLD (hyperlipidemia)    Hypertension    Hypothyroidism    Membranoproliferative glomerulonephritis type 2, idiopathic    Pneumonia    Thyroid  disease      Past Surgical History:  Procedure Laterality Date   A/V FISTULAGRAM Left 09/09/2022   Procedure: A/V Fistulagram;  Surgeon: Jama Cordella MATSU, MD;  Location: [facility] INVASIVE CV LAB;  Service: Cardiovascular;  Laterality: Left;   ABDOMINAL HYSTERECTOMY  06/24/2022   ABDOMINAL SURGERY     AV FISTULA PLACEMENT Left 01/17/2022   Procedure: ARTERIOVENOUS (AV) FISTULA CREATION ( RADIAL CEPHALIC);  Surgeon: Jama Cordella MATSU, MD;  Location: [facility] ORS;  Service: Vascular;  Laterality: Left;   CERVICAL BIOPSY  W/ LOOP ELECTRODE EXCISION  2018   COLONOSCOPY N/A 01/30/2022   Procedure: COLONOSCOPY;  Surgeon: Onita Elspeth Sharper, DO;  Location: [facility] ENDOSCOPY;  Service:  Gastroenterology;  Laterality: N/A;   COLONOSCOPY WITH PROPOFOL  N/A 02/01/2022   Procedure: COLONOSCOPY WITH PROPOFOL ;  Surgeon: Maryruth Ole DASEN, MD;  Location: [facility] ENDOSCOPY;  Service: Endoscopy;  Laterality: N/A;   DIAGNOSTIC LAPAROSCOPY     due to removal of PD catheters   DIALYSIS/PERMA CATHETER INSERTION N/A 06/09/2022   Procedure: DIALYSIS/PERMA CATHETER INSERTION;  Surgeon: Marea Selinda RAMAN, MD;  Location: [facility];  Service: Cardiovascular;  Laterality: N/A;   DIALYSIS/PERMA CATHETER INSERTION N/A 06/30/2022   Procedure: DIALYSIS/PERMA CATHETER INSERTION;  Surgeon: Gretta Lonni PARAS, MD;  Location: [facility];  Service: Cardiovascular;  Laterality: N/A;   DIALYSIS/PERMA CATHETER INSERTION N/A 09/28/2023   Procedure: DIALYSIS/PERMA CATHETER INSERTION;  Surgeon: Norine Manuelita LABOR, MD;  Location: [facility];  Service: Cardiovascular;  Laterality: N/A;   DIALYSIS/PERMA CATHETER INSERTION N/A 09/30/2023   Procedure: DIALYSIS/PERMA CATHETER INSERTION;  Surgeon: Melia Lynwood ORN, MD;  Location: [facility] INVASIVE CV LAB;  Service: Cardiovascular;  Laterality: N/A;   DIALYSIS/PERMA CATHETER INSERTION N/A 10/23/2023   Procedure: DIALYSIS/PERMA CATHETER INSERTION;  Surgeon: Tobie Gordy POUR, MD;  Location: [facility] INVASIVE CV LAB;  Service: Cardiovascular;  Laterality: N/A;   DIALYSIS/PERMA CATHETER REMOVAL N/A 09/28/2023   Procedure: DIALYSIS/PERMA CATHETER REMOVAL;  Surgeon: Norine Manuelita LABOR, MD;  Location: [facility];  Service: Cardiovascular;  Laterality: N/A;   DIALYSIS/PERMA CATHETER REMOVAL N/A 10/23/2023   Procedure: DIALYSIS/PERMA CATHETER REMOVAL;  Surgeon: Tobie Gordy POUR, MD;  Location: [facility] INVASIVE CV LAB;  Service: Cardiovascular;  Laterality: N/A;   ESOPHAGOGASTRODUODENOSCOPY N/A 01/30/2022   Procedure: ESOPHAGOGASTRODUODENOSCOPY (EGD);  Surgeon: Onita Elspeth Sharper, DO;  Location: [facility] ENDOSCOPY;  Service: Gastroenterology;  Laterality: N/A;    ESOPHAGOGASTRODUODENOSCOPY (EGD) WITH PROPOFOL  N/A 02/01/2022   Procedure: ESOPHAGOGASTRODUODENOSCOPY (EGD) WITH PROPOFOL ;  Surgeon: Maryruth Ole DASEN, MD;  Location: [facility] ENDOSCOPY;  Service: Endoscopy;  Laterality: N/A;   IR FLUORO GUIDE CV LINE RIGHT  10/24/2023   IR REMOVAL TUN CV CATH W/O FL  10/24/2023   IR TUNNELED CENTRAL VENOUS CATH PLC W IMG  09/23/2024   IR US  GUIDE VASC ACCESS RIGHT  10/24/2023   PERIPHERAL VASCULAR BALLOON ANGIOPLASTY Right 09/28/2023   Procedure: PERIPHERAL VASCULAR BALLOON ANGIOPLASTY;  Surgeon: Norine Manuelita LABOR, MD;  Location: [facility];  Service: Cardiovascular;  Laterality: Right;  innominate sheath   RENAL BIOPSY     x 2   TUMOR REMOVAL     right face   UTERINE ARTERY EMBOLIZATION  05/04/2022    Family History  Problem Relation Age of Onset   Multiple sclerosis Mother    Cancer Father    COPD Father     Social History[3]  ROS Refer to HPI for ROS details.  Objective:   Vitals: BP 125/81 (BP Location: Left Arm)   Pulse 90   Temp 98.8 F (37.1 C) (Oral)   Resp 18   LMP 03/31/2022 Comment: pt bleeding last 6 weeks with recent multiple transfusions  SpO2 98%   Physical Exam Vitals and nursing note reviewed.  Constitutional:      General: She is not in acute distress.    Appearance: She is well-developed. She is not ill-appearing or toxic-appearing.  HENT:     Head: Normocephalic and atraumatic.     Nose: Nose normal. No congestion or rhinorrhea.     Mouth/Throat:     Mouth: Mucous membranes are moist.  Cardiovascular:     Rate and Rhythm: Normal rate and regular rhythm.     Heart sounds: Normal heart sounds. No murmur heard. Pulmonary:     Effort: Pulmonary effort is normal. No respiratory distress.     Breath sounds: No stridor. Rhonchi present. No wheezing or rales.  Chest:     Chest wall: No tenderness.  Abdominal:     General: There is no distension.     Tenderness: There is no abdominal tenderness. There is no  right CVA tenderness or left CVA tenderness.  Skin:    General: Skin is warm and dry.  Neurological:     General: No focal deficit present.     Mental Status: She is alert and oriented to person, place, and time.  Psychiatric:        Mood and Affect: Mood normal.        Behavior: Behavior normal.     Procedures  No results found for this or any previous visit (from the past 24 hours).  DG Chest 2 View Result Date: 09/28/2024 EXAM: 2 VIEW(S) XRAY OF THE CHEST 09/28/2024 07:52:49 PM COMPARISON: Chest x ray 06/07/2024. CLINICAL HISTORY: Shortness of breath. FINDINGS: LINES, TUBES AND DEVICES: Left sided central venous catheter tip projects over the right atrium. LUNGS AND PLEURA: New moderate left pleural effusion with some patchy left basilar opacities. Right lung is clear. No pneumothorax. HEART AND MEDIASTINUM: No acute abnormality of the cardiac and mediastinal silhouettes. BONES AND SOFT TISSUES: No acute osseous abnormality. IMPRESSION: 1. New  moderate left pleural effusion with patchy left basilar opacities. 2. Left-sided central venous catheter tip projects over the right atrium. Electronically signed by: Greig Pique MD 09/28/2024 07:55 PM EST RP Workstation: [id number]     Assessment and Plan :     Discharge Instructions       1. Pneumonia of left lower lobe due to infectious organism (Primary) - DG Chest 2 View x-ray performed in UC shows moderate left pleural effusion with patchy left basilar opacity consistent with possible pneumonia.  Empiric treatment with azithromycin  and prednisone  initiated. - ED EKG completed in UC shows normal sinus rhythm with a ventricular rate of 94 bpm, normal EKG, no STEMI. - azithromycin  (ZITHROMAX  Z-PAK) 250 MG tablet; Take 500 mg on day 1 followed by 250 mg for 4 days.  Take medication for a total of 5 days.  Dispense: 6 tablet; Refill: 0 - predniSONE  (DELTASONE ) 20 MG tablet; Take 2 tablets (40 mg total) by mouth daily for 5 days.  Dispense:  10 tablet; Refill: 0  -Continue to monitor symptoms for any change in severity if there is any escalation of current symptoms or development of new symptoms follow-up in ER for further evaluation and management.       Ajeet Casasola B Sparrow Sanzo    [1] No current facility-administered medications for this encounter.  Current Outpatient Medications:    azithromycin  (ZITHROMAX  Z-PAK) 250 MG tablet, Take 500 mg on day 1 followed by 250 mg for 4 days.  Take medication for a total of 5 days., Disp: 6 tablet, Rfl: 0   predniSONE  (DELTASONE ) 20 MG tablet, Take 2 tablets (40 mg total) by mouth daily for 5 days., Disp: 10 tablet, Rfl: 0   acetaminophen  (TYLENOL ) 500 MG tablet, Take 500 mg by mouth every 6 (six) hours as needed. (Patient not taking: Reported on 06/10/2024), Disp: , Rfl:    acyclovir  (ZOVIRAX ) 200 MG capsule, Take 200 mg by mouth 2 (two) times daily as needed (out breaks)., Disp: , Rfl:    albuterol  (VENTOLIN  HFA) 108 (90 Base) MCG/ACT inhaler, INHALE 2 TO 4 PUFFS INTO THE LUNGS EVERY 4 HOURS AS NEEDED FOR WHEEZING OR COUGH, Disp: 18 g, Rfl: 1   amLODipine  (NORVASC ) 10 MG tablet, Take 10 mg by mouth daily. Per [facility] discharge 12/24/23, Disp: , Rfl:    ascorbic acid (VITAMIN C) 250 MG CHEW, Chew 250 mg by mouth daily. (Patient not taking: Reported on 06/10/2024), Disp: , Rfl:    atomoxetine  (STRATTERA ) 10 MG capsule, Take 1 capsule (10 mg total) by mouth 2 (two) times daily with a meal., Disp: 60 capsule, Rfl: 2   carvedilol  (COREG ) 12.5 MG tablet, Take 12.5 mg by mouth 2 (two) times daily with a meal., Disp: , Rfl:    cinacalcet  (SENSIPAR ) 60 MG tablet, Take 60 mg by mouth at bedtime., Disp: , Rfl:    diclofenac  Sodium (VOLTAREN ) 1 % GEL, Apply 1 application  topically 4 (four) times daily as needed (hand,feet,thighs and back pain)., Disp: , Rfl:    EPINEPHrine  0.3 mg/0.3 mL IJ SOAJ injection, Inject 0.3 mg into the muscle as needed for anaphylaxis., Disp: , Rfl:    epoetin  alfa (EPOGEN ) 20000  UNIT/ML injection, Inject 20,000 Units into the skin every 14 (fourteen) days., Disp: , Rfl:    ferric citrate  (AURYXIA ) 1 GM 210 MG(Fe) tablet, Take 420 mg by mouth in the morning, at noon, and at bedtime. (Patient taking differently: Take 420 mg by mouth in the morning, at noon, and at  bedtime. 2 times daily with meal), Disp: , Rfl:    gabapentin  (NEURONTIN ) 100 MG capsule, Take 100 mg by mouth at bedtime. Per [facility] discharged 12/24/23 (Patient not taking: Reported on 06/10/2024), Disp: , Rfl:    HYDROcodone -acetaminophen  (NORCO) 7.5-325 MG tablet, Take 1 tablet by mouth 5 (five) times daily as needed for pain stop percocet 7.5/325., Disp: 100 tablet, Rfl: 0   HYDROcodone -acetaminophen  (NORCO/VICODIN) 5-325 MG tablet, Take 1 tablet by mouth 4 (four) times daily as needed for pain., Disp: 120 tablet, Rfl: 0   HYDROcodone -acetaminophen  (NORCO/VICODIN) 5-325 MG tablet, Take 1 tablet by mouth 5 (five) times daily as needed for pain, Disp: 150 tablet, Rfl: 0   HYDROcodone -acetaminophen  (NORCO/VICODIN) 5-325 MG tablet, Take 1 tablet by mouth 5 (five) times daily as needed for pain. (Patient not taking: Reported on 06/10/2024), Disp: 150 tablet, Rfl: 0   HYDROcodone -acetaminophen  (NORCO/VICODIN) 5-325 MG tablet, Take 1 tablet by mouth 5 (five) times daily as needed for pain. (Patient not taking: Reported on 06/10/2024), Disp: 150 tablet, Rfl: 0   HYDROcodone -acetaminophen  (NORCO/VICODIN) 5-325 MG tablet, Take 1 tablet by mouth 5 (five) times daily as needed for pain. (Patient not taking: Reported on 06/10/2024), Disp: 150 tablet, Rfl: 0   HYDROcodone -acetaminophen  (NORCO/VICODIN) 5-325 MG tablet, Take 1 tablet by mouth 5 (five) times daily as needed for pain. (Patient not taking: Reported on 06/10/2024), Disp: 150 tablet, Rfl: 0   HYDROcodone -acetaminophen  (NORCO/VICODIN) 5-325 MG tablet, Take 1 tablet by mouth 5 (five) times daily as needed for pain, Disp: 150 tablet, Rfl: 0   HYDROcodone -acetaminophen   (NORCO/VICODIN) 5-325 MG tablet, Take 1 tablet by mouth 5 (five) times daily as needed for pain, Disp: 150 tablet, Rfl: 0   [START ON 10/01/2024] HYDROcodone -acetaminophen  (NORCO/VICODIN) 5-325 MG tablet, Take 1 tablet by mouth 5 (five) times daily as needed for pain 10/01/24, Disp: 150 tablet, Rfl: 0   ipratropium-albuterol  (DUONEB) 0.5-2.5 (3) MG/3ML SOLN, USE 1 AMPULE IN NEBULIZER EVERY 6 HOURS AS NEEDED, Disp: 360 mL, Rfl: 0   iron  sucrose (VENOFER ) 20 MG/ML injection, Inject 1,000 mg into the vein every 30 (thirty) days. (Patient taking differently: Inject 1,000 mg into the vein every 30 (thirty) days. 100 every month), Disp: , Rfl:    levothyroxine  (SYNTHROID ) 200 MCG tablet, Take 200 mcg by mouth at bedtime., Disp: , Rfl:    loratadine  (CLARITIN ) 10 MG tablet, Take 20 mg by mouth., Disp: , Rfl:    oxyCODONE -acetaminophen  (PERCOCET) 7.5-325 MG tablet, Take 1 tablet by mouth every 4 (four) hours as needed for pain, stop hydrocodone  5/325. (Patient not taking: Reported on 06/10/2024), Disp: 90 tablet, Rfl: 0   tiZANidine  (ZANAFLEX ) 4 MG tablet, Take 1/2 tablet (2 mg total) by mouth 3 (three) times daily as needed. (Patient not taking: Reported on 06/10/2024), Disp: 45 tablet, Rfl: 0   Vitamin D, Ergocalciferol, (DRISDOL) 1.25 MG (50000 UNIT) CAPS capsule, Take 50,000 Units by mouth once a week. wednesday (Patient not taking: Reported on 06/10/2024), Disp: , Rfl:  [2]  Allergies Allergen Reactions   Ancef  [Cefazolin ] Anaphylaxis    Patient reports 12/11/23 hospitalization   Ciprofloxacin Anaphylaxis and Other (See Comments)    Caused Seizures   Seizures   Other reaction(s): Other (See Comments)  Seizures  Seizures     Caused Seizures  Seizures  Seizures  Other reaction(s): Other (See Comments) Seizures  Seizures  Seizures     Other reaction(s): Other (See Comments) Seizures  Seizures  Seizures     Caused Seizures  Caused Seizures  Seizures  Seizures  Other reaction(s): Other (See  Comments) Seizures  Seizures  Seizures    Seizures Grand mal   Levetiracetam Anaphylaxis and Other (See Comments)    Medical Record H&P (unknown reaction)  Medical Record H&P  Other reaction(s): Other (See Comments)   Oxycodone -Acetaminophen  Nausea Only and Other (See Comments)   Rituximab Anaphylaxis and Swelling   Tramadol Anaphylaxis and Other (See Comments)    Caused seizures  Other reaction(s): Other (See Comments)  Medical Record H&P  Medical Record H&P    Caused seizures  Other Reaction(s): Other (See Comments)  Caused seizures  Medical Record H&P  Medical Record H&P  Other reaction(s): seizures  Medical Record H&P  Medical Record H&P  Medical Record H&P   Chlorhexidine  Other (See Comments)    Pt states skin becomes irritated and blisters  [3]  Social History Tobacco Use   Smoking status: Every Day    Current packs/day: 0.00    Types: Cigarettes    Last attempt to quit: 2023    Years since quitting: 3.0   Smokeless tobacco: Current   Tobacco comments:    Started smoking at age 34. 1/2 a pack a day.06/10/2024  Vaping Use   Vaping status: Never Used  Substance Use Topics   Alcohol use: Never   Drug use: Never     Aurea Ethel NOVAK, NP 09/28/24 2007  "

## 2024-09-29 ENCOUNTER — Ambulatory Visit (HOSPITAL_COMMUNITY): Payer: Self-pay

## 2024-11-16 ENCOUNTER — Ambulatory Visit

## 2024-11-25 ENCOUNTER — Ambulatory Visit

## 2024-12-06 ENCOUNTER — Encounter: Admitting: Family Medicine
